# Patient Record
Sex: Male | Born: 1940 | Race: White | Hispanic: No | Marital: Married | Smoking: Never smoker
Health system: Southern US, Community
[De-identification: ages and names within clinical notes are randomized; demographics above are authoritative.]

## PROBLEM LIST (undated history)

## (undated) DIAGNOSIS — B029 Zoster without complications: Secondary | ICD-10-CM

## (undated) DIAGNOSIS — L84 Corns and callosities: Secondary | ICD-10-CM

## (undated) DIAGNOSIS — E039 Hypothyroidism, unspecified: Secondary | ICD-10-CM

## (undated) DIAGNOSIS — G250 Essential tremor: Secondary | ICD-10-CM

## (undated) DIAGNOSIS — R5383 Other fatigue: Secondary | ICD-10-CM

## (undated) DIAGNOSIS — M2041 Other hammer toe(s) (acquired), right foot: Secondary | ICD-10-CM

## (undated) DIAGNOSIS — I495 Sick sinus syndrome: Secondary | ICD-10-CM

## (undated) DIAGNOSIS — N201 Calculus of ureter: Secondary | ICD-10-CM

## (undated) DIAGNOSIS — M2042 Other hammer toe(s) (acquired), left foot: Secondary | ICD-10-CM

## (undated) DIAGNOSIS — B9681 Helicobacter pylori [H. pylori] as the cause of diseases classified elsewhere: Secondary | ICD-10-CM

## (undated) DIAGNOSIS — R001 Bradycardia, unspecified: Secondary | ICD-10-CM

## (undated) DIAGNOSIS — R040 Epistaxis: Secondary | ICD-10-CM

## (undated) DIAGNOSIS — K117 Disturbances of salivary secretion: Secondary | ICD-10-CM

## (undated) DIAGNOSIS — R600 Localized edema: Secondary | ICD-10-CM

## (undated) DIAGNOSIS — K5909 Other constipation: Secondary | ICD-10-CM

## (undated) DIAGNOSIS — M79644 Pain in right finger(s): Secondary | ICD-10-CM

## (undated) DIAGNOSIS — I10 Essential (primary) hypertension: Secondary | ICD-10-CM

## (undated) DIAGNOSIS — H6123 Impacted cerumen, bilateral: Secondary | ICD-10-CM

## (undated) DIAGNOSIS — R233 Spontaneous ecchymoses: Secondary | ICD-10-CM

## (undated) DIAGNOSIS — E119 Type 2 diabetes mellitus without complications: Secondary | ICD-10-CM

## (undated) DIAGNOSIS — E291 Testicular hypofunction: Secondary | ICD-10-CM

## (undated) DIAGNOSIS — E663 Overweight: Secondary | ICD-10-CM

## (undated) DIAGNOSIS — M79661 Pain in right lower leg: Secondary | ICD-10-CM

## (undated) DIAGNOSIS — I482 Chronic atrial fibrillation, unspecified: Secondary | ICD-10-CM

## (undated) DIAGNOSIS — N529 Male erectile dysfunction, unspecified: Secondary | ICD-10-CM

## (undated) DIAGNOSIS — K279 Peptic ulcer, site unspecified, unspecified as acute or chronic, without hemorrhage or perforation: Secondary | ICD-10-CM

## (undated) DIAGNOSIS — C678 Malignant neoplasm of overlapping sites of bladder: Secondary | ICD-10-CM

## (undated) DIAGNOSIS — N200 Calculus of kidney: Secondary | ICD-10-CM

## (undated) DIAGNOSIS — R6884 Jaw pain: Secondary | ICD-10-CM

## (undated) DIAGNOSIS — L03115 Cellulitis of right lower limb: Secondary | ICD-10-CM

## (undated) DIAGNOSIS — E785 Hyperlipidemia, unspecified: Secondary | ICD-10-CM

## (undated) DIAGNOSIS — F5101 Primary insomnia: Secondary | ICD-10-CM

## (undated) DIAGNOSIS — C679 Malignant neoplasm of bladder, unspecified: Secondary | ICD-10-CM

## (undated) DIAGNOSIS — G4730 Sleep apnea, unspecified: Secondary | ICD-10-CM

## (undated) DIAGNOSIS — R0602 Shortness of breath: Secondary | ICD-10-CM

## (undated) DIAGNOSIS — R3911 Hesitancy of micturition: Secondary | ICD-10-CM

## (undated) DIAGNOSIS — M26622 Arthralgia of left temporomandibular joint: Secondary | ICD-10-CM

## (undated) DIAGNOSIS — I509 Heart failure, unspecified: Secondary | ICD-10-CM

## (undated) DIAGNOSIS — I4891 Unspecified atrial fibrillation: Secondary | ICD-10-CM

## (undated) DIAGNOSIS — C4492 Squamous cell carcinoma of skin, unspecified: Secondary | ICD-10-CM

## (undated) HISTORY — DX: Jaw pain: R68.84

## (undated) HISTORY — DX: Bradycardia, unspecified: R00.1

## (undated) HISTORY — DX: Male erectile dysfunction, unspecified: N52.9

## (undated) HISTORY — DX: Arthralgia of left temporomandibular joint: M26.622

## (undated) HISTORY — DX: Other constipation: K59.09

## (undated) HISTORY — DX: Other hammer toe(s) (acquired), right foot: M20.41

## (undated) HISTORY — DX: Other fatigue: R53.83

## (undated) HISTORY — DX: Shortness of breath: R06.02

## (undated) HISTORY — DX: Essential (primary) hypertension: I10

## (undated) HISTORY — DX: Testicular hypofunction: E29.1

## (undated) HISTORY — DX: Cellulitis of right lower limb: L03.115

## (undated) HISTORY — DX: Hypothyroidism, unspecified: E03.9

## (undated) HISTORY — DX: Impacted cerumen, bilateral: H61.23

## (undated) HISTORY — DX: Pain in right finger(s): M79.644

## (undated) HISTORY — DX: Calculus of kidney: N20.0

## (undated) HISTORY — DX: Malignant neoplasm of overlapping sites of bladder: C67.8

## (undated) HISTORY — DX: Hyperlipidemia, unspecified: E78.5

## (undated) HISTORY — DX: Malignant neoplasm of bladder, unspecified: C67.9

## (undated) HISTORY — DX: Localized edema: R60.0

## (undated) HISTORY — DX: Spontaneous ecchymoses: R23.3

## (undated) HISTORY — DX: Sick sinus syndrome: I49.5

## (undated) HISTORY — DX: Calculus of ureter: N20.1

## (undated) HISTORY — DX: Epistaxis: R04.0

## (undated) HISTORY — DX: Corns and callosities: L84

## (undated) HISTORY — DX: Primary insomnia: F51.01

## (undated) HISTORY — DX: Type 2 diabetes mellitus without complications: E11.9

## (undated) HISTORY — DX: Sleep apnea, unspecified: G47.30

## (undated) HISTORY — DX: Disturbances of salivary secretion: K11.7

## (undated) HISTORY — DX: Peptic ulcer, site unspecified, unspecified as acute or chronic, without hemorrhage or perforation: K27.9

## (undated) HISTORY — DX: Essential tremor: G25.0

## (undated) HISTORY — DX: Chronic atrial fibrillation, unspecified: I48.20

## (undated) HISTORY — DX: Overweight: E66.3

## (undated) HISTORY — DX: Hesitancy of micturition: R39.11

## (undated) HISTORY — DX: Pain in right lower leg: M79.661

## (undated) HISTORY — DX: Squamous cell carcinoma of skin, unspecified: C44.92

## (undated) HISTORY — DX: Zoster without complications: B02.9

## (undated) HISTORY — PX: SHOULDER SURGERY: SHX246

## (undated) HISTORY — PX: LUMBAR SPINE SURGERY: SHX701

## (undated) HISTORY — DX: Other hammer toe(s) (acquired), left foot: M20.42

## (undated) HISTORY — DX: Helicobacter pylori (H. pylori) as the cause of diseases classified elsewhere: B96.81

---
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
## 1992-06-19 DIAGNOSIS — E119 Type 2 diabetes mellitus without complications: Secondary | ICD-10-CM | POA: Insufficient documentation

## 1992-06-19 HISTORY — DX: Type 2 diabetes mellitus without complications: E11.9

## 2011-06-29 DIAGNOSIS — E119 Type 2 diabetes mellitus without complications: Secondary | ICD-10-CM | POA: Diagnosis not present

## 2011-06-29 DIAGNOSIS — I959 Hypotension, unspecified: Secondary | ICD-10-CM | POA: Diagnosis not present

## 2011-06-29 DIAGNOSIS — L570 Actinic keratosis: Secondary | ICD-10-CM | POA: Diagnosis not present

## 2011-06-29 DIAGNOSIS — E785 Hyperlipidemia, unspecified: Secondary | ICD-10-CM | POA: Diagnosis not present

## 2011-06-29 DIAGNOSIS — M539 Dorsopathy, unspecified: Secondary | ICD-10-CM | POA: Diagnosis not present

## 2011-06-29 DIAGNOSIS — R110 Nausea: Secondary | ICD-10-CM | POA: Diagnosis not present

## 2011-07-05 DIAGNOSIS — R39198 Other difficulties with micturition: Secondary | ICD-10-CM | POA: Diagnosis not present

## 2011-07-05 DIAGNOSIS — R319 Hematuria, unspecified: Secondary | ICD-10-CM | POA: Diagnosis not present

## 2011-07-05 DIAGNOSIS — C679 Malignant neoplasm of bladder, unspecified: Secondary | ICD-10-CM | POA: Diagnosis not present

## 2011-07-06 DIAGNOSIS — M545 Low back pain, unspecified: Secondary | ICD-10-CM | POA: Diagnosis not present

## 2011-07-06 DIAGNOSIS — M5137 Other intervertebral disc degeneration, lumbosacral region: Secondary | ICD-10-CM | POA: Diagnosis not present

## 2011-07-06 DIAGNOSIS — M48061 Spinal stenosis, lumbar region without neurogenic claudication: Secondary | ICD-10-CM | POA: Diagnosis not present

## 2011-07-06 DIAGNOSIS — M5126 Other intervertebral disc displacement, lumbar region: Secondary | ICD-10-CM | POA: Diagnosis not present

## 2011-07-06 DIAGNOSIS — G8929 Other chronic pain: Secondary | ICD-10-CM | POA: Diagnosis not present

## 2011-07-06 DIAGNOSIS — M47817 Spondylosis without myelopathy or radiculopathy, lumbosacral region: Secondary | ICD-10-CM | POA: Diagnosis not present

## 2011-07-12 DIAGNOSIS — C679 Malignant neoplasm of bladder, unspecified: Secondary | ICD-10-CM | POA: Diagnosis not present

## 2011-08-03 DIAGNOSIS — L600 Ingrowing nail: Secondary | ICD-10-CM | POA: Diagnosis not present

## 2011-10-02 DIAGNOSIS — E785 Hyperlipidemia, unspecified: Secondary | ICD-10-CM | POA: Diagnosis not present

## 2011-10-02 DIAGNOSIS — M549 Dorsalgia, unspecified: Secondary | ICD-10-CM | POA: Diagnosis not present

## 2011-10-02 DIAGNOSIS — E119 Type 2 diabetes mellitus without complications: Secondary | ICD-10-CM | POA: Diagnosis not present

## 2011-10-04 DIAGNOSIS — C679 Malignant neoplasm of bladder, unspecified: Secondary | ICD-10-CM | POA: Diagnosis not present

## 2011-12-07 DIAGNOSIS — E119 Type 2 diabetes mellitus without complications: Secondary | ICD-10-CM | POA: Diagnosis not present

## 2011-12-28 DIAGNOSIS — L600 Ingrowing nail: Secondary | ICD-10-CM | POA: Diagnosis not present

## 2011-12-28 DIAGNOSIS — E119 Type 2 diabetes mellitus without complications: Secondary | ICD-10-CM | POA: Diagnosis not present

## 2012-01-03 DIAGNOSIS — C679 Malignant neoplasm of bladder, unspecified: Secondary | ICD-10-CM | POA: Diagnosis not present

## 2012-04-18 DIAGNOSIS — C679 Malignant neoplasm of bladder, unspecified: Secondary | ICD-10-CM | POA: Diagnosis not present

## 2012-05-27 DIAGNOSIS — IMO0002 Reserved for concepts with insufficient information to code with codable children: Secondary | ICD-10-CM | POA: Diagnosis not present

## 2012-05-27 DIAGNOSIS — J3489 Other specified disorders of nose and nasal sinuses: Secondary | ICD-10-CM | POA: Diagnosis not present

## 2012-06-06 DIAGNOSIS — IMO0002 Reserved for concepts with insufficient information to code with codable children: Secondary | ICD-10-CM | POA: Diagnosis not present

## 2012-06-14 DIAGNOSIS — Z6834 Body mass index (BMI) 34.0-34.9, adult: Secondary | ICD-10-CM | POA: Diagnosis not present

## 2012-06-14 DIAGNOSIS — E119 Type 2 diabetes mellitus without complications: Secondary | ICD-10-CM | POA: Diagnosis not present

## 2012-06-14 DIAGNOSIS — M549 Dorsalgia, unspecified: Secondary | ICD-10-CM | POA: Diagnosis not present

## 2012-06-14 DIAGNOSIS — K219 Gastro-esophageal reflux disease without esophagitis: Secondary | ICD-10-CM | POA: Diagnosis not present

## 2012-06-19 DIAGNOSIS — B029 Zoster without complications: Secondary | ICD-10-CM

## 2012-06-19 HISTORY — DX: Zoster without complications: B02.9

## 2012-06-20 DIAGNOSIS — C679 Malignant neoplasm of bladder, unspecified: Secondary | ICD-10-CM | POA: Diagnosis not present

## 2012-06-20 DIAGNOSIS — R109 Unspecified abdominal pain: Secondary | ICD-10-CM | POA: Diagnosis not present

## 2012-06-20 DIAGNOSIS — R3129 Other microscopic hematuria: Secondary | ICD-10-CM | POA: Diagnosis not present

## 2012-06-20 DIAGNOSIS — R1032 Left lower quadrant pain: Secondary | ICD-10-CM | POA: Diagnosis not present

## 2012-06-20 DIAGNOSIS — N200 Calculus of kidney: Secondary | ICD-10-CM | POA: Diagnosis not present

## 2012-06-21 DIAGNOSIS — C679 Malignant neoplasm of bladder, unspecified: Secondary | ICD-10-CM | POA: Diagnosis not present

## 2012-06-21 DIAGNOSIS — B0229 Other postherpetic nervous system involvement: Secondary | ICD-10-CM | POA: Diagnosis not present

## 2012-06-25 DIAGNOSIS — E119 Type 2 diabetes mellitus without complications: Secondary | ICD-10-CM | POA: Diagnosis not present

## 2012-06-25 DIAGNOSIS — D494 Neoplasm of unspecified behavior of bladder: Secondary | ICD-10-CM | POA: Diagnosis not present

## 2012-06-25 DIAGNOSIS — C679 Malignant neoplasm of bladder, unspecified: Secondary | ICD-10-CM | POA: Diagnosis not present

## 2012-06-28 DIAGNOSIS — B0229 Other postherpetic nervous system involvement: Secondary | ICD-10-CM | POA: Diagnosis not present

## 2012-07-04 DIAGNOSIS — E119 Type 2 diabetes mellitus without complications: Secondary | ICD-10-CM | POA: Diagnosis not present

## 2012-07-04 DIAGNOSIS — B029 Zoster without complications: Secondary | ICD-10-CM | POA: Diagnosis not present

## 2012-07-04 DIAGNOSIS — G8929 Other chronic pain: Secondary | ICD-10-CM | POA: Diagnosis not present

## 2012-07-04 DIAGNOSIS — I10 Essential (primary) hypertension: Secondary | ICD-10-CM | POA: Diagnosis not present

## 2012-07-04 DIAGNOSIS — E039 Hypothyroidism, unspecified: Secondary | ICD-10-CM | POA: Diagnosis not present

## 2012-07-04 DIAGNOSIS — E785 Hyperlipidemia, unspecified: Secondary | ICD-10-CM | POA: Diagnosis not present

## 2012-07-09 DIAGNOSIS — R6889 Other general symptoms and signs: Secondary | ICD-10-CM | POA: Diagnosis not present

## 2012-07-11 DIAGNOSIS — R6889 Other general symptoms and signs: Secondary | ICD-10-CM | POA: Diagnosis not present

## 2012-07-11 DIAGNOSIS — IMO0002 Reserved for concepts with insufficient information to code with codable children: Secondary | ICD-10-CM | POA: Diagnosis not present

## 2012-07-11 DIAGNOSIS — M48061 Spinal stenosis, lumbar region without neurogenic claudication: Secondary | ICD-10-CM | POA: Diagnosis not present

## 2012-07-11 DIAGNOSIS — M5137 Other intervertebral disc degeneration, lumbosacral region: Secondary | ICD-10-CM | POA: Diagnosis not present

## 2012-07-16 DIAGNOSIS — IMO0002 Reserved for concepts with insufficient information to code with codable children: Secondary | ICD-10-CM | POA: Diagnosis not present

## 2012-07-16 DIAGNOSIS — M48061 Spinal stenosis, lumbar region without neurogenic claudication: Secondary | ICD-10-CM | POA: Diagnosis not present

## 2012-07-16 DIAGNOSIS — M5137 Other intervertebral disc degeneration, lumbosacral region: Secondary | ICD-10-CM | POA: Diagnosis not present

## 2012-07-16 DIAGNOSIS — Z79899 Other long term (current) drug therapy: Secondary | ICD-10-CM | POA: Diagnosis not present

## 2012-07-16 DIAGNOSIS — M1610 Unilateral primary osteoarthritis, unspecified hip: Secondary | ICD-10-CM | POA: Diagnosis not present

## 2012-07-16 DIAGNOSIS — M169 Osteoarthritis of hip, unspecified: Secondary | ICD-10-CM | POA: Diagnosis not present

## 2012-07-30 DIAGNOSIS — IMO0002 Reserved for concepts with insufficient information to code with codable children: Secondary | ICD-10-CM | POA: Diagnosis not present

## 2012-07-30 DIAGNOSIS — M1610 Unilateral primary osteoarthritis, unspecified hip: Secondary | ICD-10-CM | POA: Diagnosis not present

## 2012-07-30 DIAGNOSIS — M169 Osteoarthritis of hip, unspecified: Secondary | ICD-10-CM | POA: Diagnosis not present

## 2012-07-30 DIAGNOSIS — I10 Essential (primary) hypertension: Secondary | ICD-10-CM | POA: Diagnosis not present

## 2012-07-30 DIAGNOSIS — Z79899 Other long term (current) drug therapy: Secondary | ICD-10-CM | POA: Diagnosis not present

## 2012-07-30 DIAGNOSIS — M5137 Other intervertebral disc degeneration, lumbosacral region: Secondary | ICD-10-CM | POA: Diagnosis not present

## 2012-07-30 DIAGNOSIS — M48061 Spinal stenosis, lumbar region without neurogenic claudication: Secondary | ICD-10-CM | POA: Diagnosis not present

## 2012-07-30 DIAGNOSIS — E119 Type 2 diabetes mellitus without complications: Secondary | ICD-10-CM | POA: Diagnosis not present

## 2012-08-02 DIAGNOSIS — R109 Unspecified abdominal pain: Secondary | ICD-10-CM | POA: Diagnosis not present

## 2012-08-02 DIAGNOSIS — K5900 Constipation, unspecified: Secondary | ICD-10-CM | POA: Diagnosis not present

## 2012-08-07 DIAGNOSIS — N289 Disorder of kidney and ureter, unspecified: Secondary | ICD-10-CM | POA: Diagnosis not present

## 2012-08-07 DIAGNOSIS — R109 Unspecified abdominal pain: Secondary | ICD-10-CM | POA: Diagnosis not present

## 2012-08-07 DIAGNOSIS — K5730 Diverticulosis of large intestine without perforation or abscess without bleeding: Secondary | ICD-10-CM | POA: Diagnosis not present

## 2012-08-14 DIAGNOSIS — N182 Chronic kidney disease, stage 2 (mild): Secondary | ICD-10-CM | POA: Diagnosis not present

## 2012-08-14 DIAGNOSIS — R809 Proteinuria, unspecified: Secondary | ICD-10-CM | POA: Diagnosis not present

## 2012-08-16 DIAGNOSIS — R809 Proteinuria, unspecified: Secondary | ICD-10-CM | POA: Diagnosis not present

## 2012-08-16 DIAGNOSIS — N182 Chronic kidney disease, stage 2 (mild): Secondary | ICD-10-CM | POA: Diagnosis not present

## 2012-08-16 DIAGNOSIS — N189 Chronic kidney disease, unspecified: Secondary | ICD-10-CM | POA: Diagnosis not present

## 2012-08-16 DIAGNOSIS — N179 Acute kidney failure, unspecified: Secondary | ICD-10-CM | POA: Diagnosis not present

## 2012-08-22 DIAGNOSIS — K2900 Acute gastritis without bleeding: Secondary | ICD-10-CM | POA: Diagnosis not present

## 2012-08-22 DIAGNOSIS — K319 Disease of stomach and duodenum, unspecified: Secondary | ICD-10-CM | POA: Diagnosis not present

## 2012-08-22 DIAGNOSIS — K5730 Diverticulosis of large intestine without perforation or abscess without bleeding: Secondary | ICD-10-CM | POA: Diagnosis not present

## 2012-08-22 DIAGNOSIS — K2940 Chronic atrophic gastritis without bleeding: Secondary | ICD-10-CM | POA: Diagnosis not present

## 2012-08-22 DIAGNOSIS — E119 Type 2 diabetes mellitus without complications: Secondary | ICD-10-CM | POA: Diagnosis not present

## 2012-08-22 DIAGNOSIS — K648 Other hemorrhoids: Secondary | ICD-10-CM | POA: Diagnosis not present

## 2012-08-22 DIAGNOSIS — D126 Benign neoplasm of colon, unspecified: Secondary | ICD-10-CM | POA: Diagnosis not present

## 2012-08-22 DIAGNOSIS — R1013 Epigastric pain: Secondary | ICD-10-CM | POA: Diagnosis not present

## 2012-08-22 DIAGNOSIS — I10 Essential (primary) hypertension: Secondary | ICD-10-CM | POA: Diagnosis not present

## 2012-09-02 DIAGNOSIS — M79609 Pain in unspecified limb: Secondary | ICD-10-CM | POA: Diagnosis not present

## 2012-09-02 DIAGNOSIS — M25559 Pain in unspecified hip: Secondary | ICD-10-CM | POA: Diagnosis not present

## 2012-09-04 DIAGNOSIS — E119 Type 2 diabetes mellitus without complications: Secondary | ICD-10-CM | POA: Diagnosis not present

## 2012-09-04 DIAGNOSIS — N182 Chronic kidney disease, stage 2 (mild): Secondary | ICD-10-CM | POA: Diagnosis not present

## 2012-09-04 DIAGNOSIS — R809 Proteinuria, unspecified: Secondary | ICD-10-CM | POA: Diagnosis not present

## 2012-09-04 DIAGNOSIS — N189 Chronic kidney disease, unspecified: Secondary | ICD-10-CM | POA: Diagnosis not present

## 2012-09-04 DIAGNOSIS — E875 Hyperkalemia: Secondary | ICD-10-CM | POA: Diagnosis not present

## 2012-09-05 DIAGNOSIS — IMO0002 Reserved for concepts with insufficient information to code with codable children: Secondary | ICD-10-CM | POA: Diagnosis not present

## 2012-09-05 DIAGNOSIS — M48061 Spinal stenosis, lumbar region without neurogenic claudication: Secondary | ICD-10-CM | POA: Diagnosis not present

## 2012-09-10 DIAGNOSIS — G5620 Lesion of ulnar nerve, unspecified upper limb: Secondary | ICD-10-CM | POA: Diagnosis not present

## 2012-09-10 DIAGNOSIS — M47817 Spondylosis without myelopathy or radiculopathy, lumbosacral region: Secondary | ICD-10-CM | POA: Diagnosis not present

## 2012-09-10 DIAGNOSIS — M8448XA Pathological fracture, other site, initial encounter for fracture: Secondary | ICD-10-CM | POA: Diagnosis not present

## 2012-09-10 DIAGNOSIS — M5137 Other intervertebral disc degeneration, lumbosacral region: Secondary | ICD-10-CM | POA: Diagnosis not present

## 2012-09-10 DIAGNOSIS — M5126 Other intervertebral disc displacement, lumbar region: Secondary | ICD-10-CM | POA: Diagnosis not present

## 2012-09-10 DIAGNOSIS — M797 Fibromyalgia: Secondary | ICD-10-CM | POA: Diagnosis not present

## 2012-09-10 DIAGNOSIS — M51379 Other intervertebral disc degeneration, lumbosacral region without mention of lumbar back pain or lower extremity pain: Secondary | ICD-10-CM | POA: Diagnosis not present

## 2012-09-10 DIAGNOSIS — M4310 Spondylolisthesis, site unspecified: Secondary | ICD-10-CM | POA: Diagnosis not present

## 2012-09-10 DIAGNOSIS — M790 Rheumatism, unspecified: Secondary | ICD-10-CM | POA: Diagnosis not present

## 2012-09-10 DIAGNOSIS — M48061 Spinal stenosis, lumbar region without neurogenic claudication: Secondary | ICD-10-CM | POA: Diagnosis not present

## 2012-09-10 DIAGNOSIS — G608 Other hereditary and idiopathic neuropathies: Secondary | ICD-10-CM | POA: Diagnosis not present

## 2012-09-10 DIAGNOSIS — G1221 Amyotrophic lateral sclerosis: Secondary | ICD-10-CM | POA: Diagnosis not present

## 2012-09-12 DIAGNOSIS — M545 Low back pain, unspecified: Secondary | ICD-10-CM | POA: Diagnosis not present

## 2012-09-13 DIAGNOSIS — N189 Chronic kidney disease, unspecified: Secondary | ICD-10-CM | POA: Diagnosis not present

## 2012-09-13 DIAGNOSIS — N281 Cyst of kidney, acquired: Secondary | ICD-10-CM | POA: Diagnosis not present

## 2012-09-16 DIAGNOSIS — M6281 Muscle weakness (generalized): Secondary | ICD-10-CM | POA: Diagnosis not present

## 2012-09-19 DIAGNOSIS — M545 Low back pain, unspecified: Secondary | ICD-10-CM | POA: Diagnosis not present

## 2012-09-19 DIAGNOSIS — M48061 Spinal stenosis, lumbar region without neurogenic claudication: Secondary | ICD-10-CM | POA: Diagnosis not present

## 2012-09-19 DIAGNOSIS — IMO0002 Reserved for concepts with insufficient information to code with codable children: Secondary | ICD-10-CM | POA: Diagnosis not present

## 2012-09-19 DIAGNOSIS — M25559 Pain in unspecified hip: Secondary | ICD-10-CM | POA: Diagnosis not present

## 2012-09-24 DIAGNOSIS — M545 Low back pain, unspecified: Secondary | ICD-10-CM | POA: Diagnosis not present

## 2012-09-24 DIAGNOSIS — M25559 Pain in unspecified hip: Secondary | ICD-10-CM | POA: Diagnosis not present

## 2012-09-25 DIAGNOSIS — R29898 Other symptoms and signs involving the musculoskeletal system: Secondary | ICD-10-CM | POA: Diagnosis not present

## 2012-09-25 DIAGNOSIS — IMO0002 Reserved for concepts with insufficient information to code with codable children: Secondary | ICD-10-CM | POA: Diagnosis not present

## 2012-09-25 DIAGNOSIS — M48061 Spinal stenosis, lumbar region without neurogenic claudication: Secondary | ICD-10-CM | POA: Diagnosis not present

## 2012-10-01 DIAGNOSIS — M545 Low back pain, unspecified: Secondary | ICD-10-CM | POA: Diagnosis not present

## 2012-10-01 DIAGNOSIS — M25559 Pain in unspecified hip: Secondary | ICD-10-CM | POA: Diagnosis not present

## 2012-10-14 DIAGNOSIS — IMO0002 Reserved for concepts with insufficient information to code with codable children: Secondary | ICD-10-CM | POA: Diagnosis not present

## 2012-10-18 DIAGNOSIS — K5730 Diverticulosis of large intestine without perforation or abscess without bleeding: Secondary | ICD-10-CM | POA: Diagnosis not present

## 2012-10-18 DIAGNOSIS — K2960 Other gastritis without bleeding: Secondary | ICD-10-CM | POA: Diagnosis not present

## 2012-10-18 DIAGNOSIS — D126 Benign neoplasm of colon, unspecified: Secondary | ICD-10-CM | POA: Diagnosis not present

## 2012-10-18 DIAGNOSIS — K5900 Constipation, unspecified: Secondary | ICD-10-CM | POA: Diagnosis not present

## 2012-10-26 DIAGNOSIS — R5381 Other malaise: Secondary | ICD-10-CM | POA: Diagnosis not present

## 2012-10-26 DIAGNOSIS — R937 Abnormal findings on diagnostic imaging of other parts of musculoskeletal system: Secondary | ICD-10-CM | POA: Diagnosis not present

## 2012-10-26 DIAGNOSIS — IMO0002 Reserved for concepts with insufficient information to code with codable children: Secondary | ICD-10-CM | POA: Diagnosis not present

## 2012-10-26 DIAGNOSIS — R5383 Other fatigue: Secondary | ICD-10-CM | POA: Diagnosis not present

## 2012-10-26 DIAGNOSIS — M5380 Other specified dorsopathies, site unspecified: Secondary | ICD-10-CM | POA: Diagnosis not present

## 2012-10-28 DIAGNOSIS — R937 Abnormal findings on diagnostic imaging of other parts of musculoskeletal system: Secondary | ICD-10-CM | POA: Diagnosis not present

## 2012-10-28 DIAGNOSIS — IMO0002 Reserved for concepts with insufficient information to code with codable children: Secondary | ICD-10-CM | POA: Diagnosis not present

## 2012-10-28 DIAGNOSIS — R5381 Other malaise: Secondary | ICD-10-CM | POA: Diagnosis not present

## 2012-10-28 DIAGNOSIS — M5380 Other specified dorsopathies, site unspecified: Secondary | ICD-10-CM | POA: Diagnosis not present

## 2012-11-03 DIAGNOSIS — Z79899 Other long term (current) drug therapy: Secondary | ICD-10-CM | POA: Diagnosis not present

## 2012-11-03 DIAGNOSIS — R1110 Vomiting, unspecified: Secondary | ICD-10-CM | POA: Diagnosis not present

## 2012-11-03 DIAGNOSIS — R109 Unspecified abdominal pain: Secondary | ICD-10-CM | POA: Diagnosis not present

## 2012-11-03 DIAGNOSIS — K279 Peptic ulcer, site unspecified, unspecified as acute or chronic, without hemorrhage or perforation: Secondary | ICD-10-CM | POA: Diagnosis not present

## 2012-11-03 DIAGNOSIS — I10 Essential (primary) hypertension: Secondary | ICD-10-CM | POA: Diagnosis not present

## 2012-11-03 DIAGNOSIS — E119 Type 2 diabetes mellitus without complications: Secondary | ICD-10-CM | POA: Diagnosis not present

## 2012-11-03 DIAGNOSIS — E039 Hypothyroidism, unspecified: Secondary | ICD-10-CM | POA: Diagnosis not present

## 2012-11-03 DIAGNOSIS — Z794 Long term (current) use of insulin: Secondary | ICD-10-CM | POA: Diagnosis not present

## 2012-11-04 DIAGNOSIS — IMO0002 Reserved for concepts with insufficient information to code with codable children: Secondary | ICD-10-CM | POA: Diagnosis not present

## 2012-11-04 DIAGNOSIS — R5381 Other malaise: Secondary | ICD-10-CM | POA: Diagnosis not present

## 2012-11-06 DIAGNOSIS — N182 Chronic kidney disease, stage 2 (mild): Secondary | ICD-10-CM | POA: Diagnosis not present

## 2012-11-06 DIAGNOSIS — R809 Proteinuria, unspecified: Secondary | ICD-10-CM | POA: Diagnosis not present

## 2012-11-06 DIAGNOSIS — E119 Type 2 diabetes mellitus without complications: Secondary | ICD-10-CM | POA: Diagnosis not present

## 2012-11-06 DIAGNOSIS — E213 Hyperparathyroidism, unspecified: Secondary | ICD-10-CM | POA: Diagnosis not present

## 2012-11-22 DIAGNOSIS — G5720 Lesion of femoral nerve, unspecified lower limb: Secondary | ICD-10-CM | POA: Diagnosis not present

## 2012-11-25 DIAGNOSIS — G5720 Lesion of femoral nerve, unspecified lower limb: Secondary | ICD-10-CM | POA: Diagnosis not present

## 2012-11-25 DIAGNOSIS — S7010XA Contusion of unspecified thigh, initial encounter: Secondary | ICD-10-CM | POA: Diagnosis not present

## 2012-11-25 DIAGNOSIS — M5137 Other intervertebral disc degeneration, lumbosacral region: Secondary | ICD-10-CM | POA: Diagnosis not present

## 2012-11-25 DIAGNOSIS — K5730 Diverticulosis of large intestine without perforation or abscess without bleeding: Secondary | ICD-10-CM | POA: Diagnosis not present

## 2012-12-04 DIAGNOSIS — M545 Low back pain, unspecified: Secondary | ICD-10-CM | POA: Diagnosis not present

## 2012-12-04 DIAGNOSIS — R262 Difficulty in walking, not elsewhere classified: Secondary | ICD-10-CM | POA: Diagnosis not present

## 2012-12-04 DIAGNOSIS — M25559 Pain in unspecified hip: Secondary | ICD-10-CM | POA: Diagnosis not present

## 2012-12-11 DIAGNOSIS — M545 Low back pain, unspecified: Secondary | ICD-10-CM | POA: Diagnosis not present

## 2012-12-11 DIAGNOSIS — M25559 Pain in unspecified hip: Secondary | ICD-10-CM | POA: Diagnosis not present

## 2012-12-11 DIAGNOSIS — R262 Difficulty in walking, not elsewhere classified: Secondary | ICD-10-CM | POA: Diagnosis not present

## 2012-12-13 DIAGNOSIS — R109 Unspecified abdominal pain: Secondary | ICD-10-CM | POA: Diagnosis not present

## 2012-12-17 DIAGNOSIS — D494 Neoplasm of unspecified behavior of bladder: Secondary | ICD-10-CM | POA: Diagnosis not present

## 2012-12-18 DIAGNOSIS — M545 Low back pain, unspecified: Secondary | ICD-10-CM | POA: Diagnosis not present

## 2012-12-18 DIAGNOSIS — M25559 Pain in unspecified hip: Secondary | ICD-10-CM | POA: Diagnosis not present

## 2012-12-18 DIAGNOSIS — R262 Difficulty in walking, not elsewhere classified: Secondary | ICD-10-CM | POA: Diagnosis not present

## 2012-12-24 DIAGNOSIS — C679 Malignant neoplasm of bladder, unspecified: Secondary | ICD-10-CM | POA: Diagnosis not present

## 2012-12-24 DIAGNOSIS — N402 Nodular prostate without lower urinary tract symptoms: Secondary | ICD-10-CM | POA: Diagnosis not present

## 2012-12-27 DIAGNOSIS — G5720 Lesion of femoral nerve, unspecified lower limb: Secondary | ICD-10-CM | POA: Diagnosis not present

## 2013-01-20 DIAGNOSIS — IMO0001 Reserved for inherently not codable concepts without codable children: Secondary | ICD-10-CM | POA: Diagnosis not present

## 2013-01-20 DIAGNOSIS — G5720 Lesion of femoral nerve, unspecified lower limb: Secondary | ICD-10-CM | POA: Diagnosis not present

## 2013-01-24 DIAGNOSIS — M545 Low back pain, unspecified: Secondary | ICD-10-CM | POA: Diagnosis not present

## 2013-01-24 DIAGNOSIS — R262 Difficulty in walking, not elsewhere classified: Secondary | ICD-10-CM | POA: Diagnosis not present

## 2013-01-24 DIAGNOSIS — M25559 Pain in unspecified hip: Secondary | ICD-10-CM | POA: Diagnosis not present

## 2013-01-30 DIAGNOSIS — M25559 Pain in unspecified hip: Secondary | ICD-10-CM | POA: Diagnosis not present

## 2013-01-30 DIAGNOSIS — M545 Low back pain, unspecified: Secondary | ICD-10-CM | POA: Diagnosis not present

## 2013-01-30 DIAGNOSIS — R262 Difficulty in walking, not elsewhere classified: Secondary | ICD-10-CM | POA: Diagnosis not present

## 2013-02-06 DIAGNOSIS — M25559 Pain in unspecified hip: Secondary | ICD-10-CM | POA: Diagnosis not present

## 2013-02-06 DIAGNOSIS — R262 Difficulty in walking, not elsewhere classified: Secondary | ICD-10-CM | POA: Diagnosis not present

## 2013-02-06 DIAGNOSIS — M545 Low back pain, unspecified: Secondary | ICD-10-CM | POA: Diagnosis not present

## 2013-03-05 DIAGNOSIS — G5720 Lesion of femoral nerve, unspecified lower limb: Secondary | ICD-10-CM | POA: Diagnosis not present

## 2013-03-11 DIAGNOSIS — IMO0002 Reserved for concepts with insufficient information to code with codable children: Secondary | ICD-10-CM | POA: Diagnosis not present

## 2013-03-11 DIAGNOSIS — B0229 Other postherpetic nervous system involvement: Secondary | ICD-10-CM | POA: Diagnosis not present

## 2013-03-11 DIAGNOSIS — G5720 Lesion of femoral nerve, unspecified lower limb: Secondary | ICD-10-CM | POA: Diagnosis not present

## 2013-03-11 DIAGNOSIS — B029 Zoster without complications: Secondary | ICD-10-CM | POA: Diagnosis not present

## 2013-03-21 DIAGNOSIS — E038 Other specified hypothyroidism: Secondary | ICD-10-CM | POA: Diagnosis not present

## 2013-03-21 DIAGNOSIS — I10 Essential (primary) hypertension: Secondary | ICD-10-CM | POA: Diagnosis not present

## 2013-03-21 DIAGNOSIS — IMO0001 Reserved for inherently not codable concepts without codable children: Secondary | ICD-10-CM | POA: Diagnosis not present

## 2013-03-21 DIAGNOSIS — E1129 Type 2 diabetes mellitus with other diabetic kidney complication: Secondary | ICD-10-CM | POA: Diagnosis not present

## 2013-03-21 DIAGNOSIS — B0223 Postherpetic polyneuropathy: Secondary | ICD-10-CM | POA: Diagnosis not present

## 2013-03-21 DIAGNOSIS — E7800 Pure hypercholesterolemia, unspecified: Secondary | ICD-10-CM | POA: Diagnosis not present

## 2013-03-21 DIAGNOSIS — R5381 Other malaise: Secondary | ICD-10-CM | POA: Diagnosis not present

## 2013-03-21 DIAGNOSIS — R030 Elevated blood-pressure reading, without diagnosis of hypertension: Secondary | ICD-10-CM | POA: Diagnosis not present

## 2013-03-28 DIAGNOSIS — G5720 Lesion of femoral nerve, unspecified lower limb: Secondary | ICD-10-CM | POA: Diagnosis not present

## 2013-04-01 DIAGNOSIS — G5720 Lesion of femoral nerve, unspecified lower limb: Secondary | ICD-10-CM | POA: Diagnosis not present

## 2013-04-01 DIAGNOSIS — B029 Zoster without complications: Secondary | ICD-10-CM | POA: Diagnosis not present

## 2013-04-01 DIAGNOSIS — IMO0002 Reserved for concepts with insufficient information to code with codable children: Secondary | ICD-10-CM | POA: Diagnosis not present

## 2013-04-01 DIAGNOSIS — B0229 Other postherpetic nervous system involvement: Secondary | ICD-10-CM | POA: Diagnosis not present

## 2013-04-29 DIAGNOSIS — E1129 Type 2 diabetes mellitus with other diabetic kidney complication: Secondary | ICD-10-CM | POA: Diagnosis not present

## 2013-04-29 DIAGNOSIS — D494 Neoplasm of unspecified behavior of bladder: Secondary | ICD-10-CM | POA: Diagnosis not present

## 2013-04-29 DIAGNOSIS — B0223 Postherpetic polyneuropathy: Secondary | ICD-10-CM | POA: Diagnosis not present

## 2013-04-29 DIAGNOSIS — R030 Elevated blood-pressure reading, without diagnosis of hypertension: Secondary | ICD-10-CM | POA: Diagnosis not present

## 2013-04-30 DIAGNOSIS — G5720 Lesion of femoral nerve, unspecified lower limb: Secondary | ICD-10-CM

## 2013-04-30 HISTORY — DX: Lesion of femoral nerve, unspecified lower limb: G57.20

## 2013-06-03 DIAGNOSIS — G250 Essential tremor: Secondary | ICD-10-CM | POA: Insufficient documentation

## 2013-06-03 DIAGNOSIS — B0229 Other postherpetic nervous system involvement: Secondary | ICD-10-CM

## 2013-06-03 DIAGNOSIS — G5720 Lesion of femoral nerve, unspecified lower limb: Secondary | ICD-10-CM | POA: Diagnosis not present

## 2013-06-03 DIAGNOSIS — IMO0002 Reserved for concepts with insufficient information to code with codable children: Secondary | ICD-10-CM

## 2013-06-03 HISTORY — DX: Other postherpetic nervous system involvement: B02.29

## 2013-06-03 HISTORY — DX: Reserved for concepts with insufficient information to code with codable children: IMO0002

## 2013-07-02 DIAGNOSIS — N138 Other obstructive and reflux uropathy: Secondary | ICD-10-CM | POA: Diagnosis not present

## 2013-07-02 DIAGNOSIS — C679 Malignant neoplasm of bladder, unspecified: Secondary | ICD-10-CM | POA: Diagnosis not present

## 2013-07-02 DIAGNOSIS — N401 Enlarged prostate with lower urinary tract symptoms: Secondary | ICD-10-CM | POA: Diagnosis not present

## 2013-07-02 DIAGNOSIS — Z125 Encounter for screening for malignant neoplasm of prostate: Secondary | ICD-10-CM | POA: Diagnosis not present

## 2013-07-02 DIAGNOSIS — N200 Calculus of kidney: Secondary | ICD-10-CM | POA: Diagnosis not present

## 2013-07-02 DIAGNOSIS — D414 Neoplasm of uncertain behavior of bladder: Secondary | ICD-10-CM | POA: Diagnosis not present

## 2013-07-15 DIAGNOSIS — R0989 Other specified symptoms and signs involving the circulatory and respiratory systems: Secondary | ICD-10-CM | POA: Diagnosis not present

## 2013-07-15 DIAGNOSIS — D494 Neoplasm of unspecified behavior of bladder: Secondary | ICD-10-CM | POA: Diagnosis not present

## 2013-07-15 DIAGNOSIS — E1129 Type 2 diabetes mellitus with other diabetic kidney complication: Secondary | ICD-10-CM | POA: Diagnosis not present

## 2013-07-15 DIAGNOSIS — B351 Tinea unguium: Secondary | ICD-10-CM | POA: Diagnosis not present

## 2013-07-15 DIAGNOSIS — S61209A Unspecified open wound of unspecified finger without damage to nail, initial encounter: Secondary | ICD-10-CM | POA: Diagnosis not present

## 2013-07-15 DIAGNOSIS — IMO0001 Reserved for inherently not codable concepts without codable children: Secondary | ICD-10-CM | POA: Diagnosis not present

## 2013-07-15 DIAGNOSIS — R030 Elevated blood-pressure reading, without diagnosis of hypertension: Secondary | ICD-10-CM | POA: Diagnosis not present

## 2013-07-15 DIAGNOSIS — E7800 Pure hypercholesterolemia, unspecified: Secondary | ICD-10-CM | POA: Diagnosis not present

## 2013-07-15 DIAGNOSIS — Z23 Encounter for immunization: Secondary | ICD-10-CM | POA: Diagnosis not present

## 2013-07-15 DIAGNOSIS — Z79899 Other long term (current) drug therapy: Secondary | ICD-10-CM | POA: Diagnosis not present

## 2013-07-15 DIAGNOSIS — B0223 Postherpetic polyneuropathy: Secondary | ICD-10-CM | POA: Diagnosis not present

## 2013-07-16 DIAGNOSIS — N138 Other obstructive and reflux uropathy: Secondary | ICD-10-CM | POA: Diagnosis not present

## 2013-07-16 DIAGNOSIS — C679 Malignant neoplasm of bladder, unspecified: Secondary | ICD-10-CM | POA: Diagnosis not present

## 2013-07-16 DIAGNOSIS — N401 Enlarged prostate with lower urinary tract symptoms: Secondary | ICD-10-CM | POA: Diagnosis not present

## 2013-07-16 DIAGNOSIS — R972 Elevated prostate specific antigen [PSA]: Secondary | ICD-10-CM | POA: Diagnosis not present

## 2013-07-21 DIAGNOSIS — R209 Unspecified disturbances of skin sensation: Secondary | ICD-10-CM | POA: Diagnosis not present

## 2013-07-21 DIAGNOSIS — R0989 Other specified symptoms and signs involving the circulatory and respiratory systems: Secondary | ICD-10-CM | POA: Diagnosis not present

## 2013-09-05 DIAGNOSIS — E1139 Type 2 diabetes mellitus with other diabetic ophthalmic complication: Secondary | ICD-10-CM | POA: Diagnosis not present

## 2013-09-05 DIAGNOSIS — E11329 Type 2 diabetes mellitus with mild nonproliferative diabetic retinopathy without macular edema: Secondary | ICD-10-CM | POA: Diagnosis not present

## 2013-10-21 DIAGNOSIS — B351 Tinea unguium: Secondary | ICD-10-CM | POA: Diagnosis not present

## 2013-10-21 DIAGNOSIS — R059 Cough, unspecified: Secondary | ICD-10-CM | POA: Diagnosis not present

## 2013-10-21 DIAGNOSIS — B0223 Postherpetic polyneuropathy: Secondary | ICD-10-CM | POA: Diagnosis not present

## 2013-10-21 DIAGNOSIS — E1165 Type 2 diabetes mellitus with hyperglycemia: Secondary | ICD-10-CM | POA: Diagnosis not present

## 2013-10-21 DIAGNOSIS — E1129 Type 2 diabetes mellitus with other diabetic kidney complication: Secondary | ICD-10-CM | POA: Diagnosis not present

## 2013-10-21 DIAGNOSIS — R05 Cough: Secondary | ICD-10-CM | POA: Diagnosis not present

## 2013-10-21 DIAGNOSIS — E038 Other specified hypothyroidism: Secondary | ICD-10-CM | POA: Diagnosis not present

## 2013-10-21 DIAGNOSIS — R42 Dizziness and giddiness: Secondary | ICD-10-CM | POA: Diagnosis not present

## 2013-10-21 DIAGNOSIS — E7800 Pure hypercholesterolemia, unspecified: Secondary | ICD-10-CM | POA: Diagnosis not present

## 2013-10-30 DIAGNOSIS — S72009A Fracture of unspecified part of neck of unspecified femur, initial encounter for closed fracture: Secondary | ICD-10-CM | POA: Diagnosis not present

## 2013-10-30 DIAGNOSIS — S298XXA Other specified injuries of thorax, initial encounter: Secondary | ICD-10-CM | POA: Diagnosis not present

## 2013-10-30 DIAGNOSIS — Z96649 Presence of unspecified artificial hip joint: Secondary | ICD-10-CM | POA: Diagnosis not present

## 2013-10-30 DIAGNOSIS — E119 Type 2 diabetes mellitus without complications: Secondary | ICD-10-CM | POA: Diagnosis not present

## 2013-10-30 DIAGNOSIS — M2550 Pain in unspecified joint: Secondary | ICD-10-CM | POA: Diagnosis not present

## 2013-10-30 DIAGNOSIS — S72009D Fracture of unspecified part of neck of unspecified femur, subsequent encounter for closed fracture with routine healing: Secondary | ICD-10-CM | POA: Diagnosis not present

## 2013-10-30 DIAGNOSIS — Z87891 Personal history of nicotine dependence: Secondary | ICD-10-CM | POA: Diagnosis not present

## 2013-10-30 DIAGNOSIS — S0990XA Unspecified injury of head, initial encounter: Secondary | ICD-10-CM | POA: Diagnosis not present

## 2013-10-30 DIAGNOSIS — S79929A Unspecified injury of unspecified thigh, initial encounter: Secondary | ICD-10-CM | POA: Diagnosis not present

## 2013-10-30 DIAGNOSIS — R9431 Abnormal electrocardiogram [ECG] [EKG]: Secondary | ICD-10-CM | POA: Diagnosis not present

## 2013-10-30 DIAGNOSIS — Z8551 Personal history of malignant neoplasm of bladder: Secondary | ICD-10-CM | POA: Diagnosis not present

## 2013-10-30 DIAGNOSIS — Z471 Aftercare following joint replacement surgery: Secondary | ICD-10-CM | POA: Diagnosis not present

## 2013-10-30 DIAGNOSIS — S8990XA Unspecified injury of unspecified lower leg, initial encounter: Secondary | ICD-10-CM | POA: Diagnosis not present

## 2013-10-30 DIAGNOSIS — S99919A Unspecified injury of unspecified ankle, initial encounter: Secondary | ICD-10-CM | POA: Diagnosis not present

## 2013-10-30 DIAGNOSIS — R296 Repeated falls: Secondary | ICD-10-CM | POA: Diagnosis not present

## 2013-10-30 DIAGNOSIS — I10 Essential (primary) hypertension: Secondary | ICD-10-CM | POA: Diagnosis present

## 2013-10-30 DIAGNOSIS — Z7401 Bed confinement status: Secondary | ICD-10-CM | POA: Diagnosis not present

## 2013-10-30 DIAGNOSIS — S72043A Displaced fracture of base of neck of unspecified femur, initial encounter for closed fracture: Secondary | ICD-10-CM | POA: Diagnosis not present

## 2013-10-30 DIAGNOSIS — B0229 Other postherpetic nervous system involvement: Secondary | ICD-10-CM | POA: Diagnosis not present

## 2013-10-30 DIAGNOSIS — E7800 Pure hypercholesterolemia, unspecified: Secondary | ICD-10-CM | POA: Diagnosis not present

## 2013-10-30 DIAGNOSIS — S72033A Displaced midcervical fracture of unspecified femur, initial encounter for closed fracture: Secondary | ICD-10-CM | POA: Diagnosis not present

## 2013-10-30 DIAGNOSIS — E559 Vitamin D deficiency, unspecified: Secondary | ICD-10-CM | POA: Diagnosis not present

## 2013-10-30 DIAGNOSIS — M6281 Muscle weakness (generalized): Secondary | ICD-10-CM | POA: Diagnosis not present

## 2013-10-30 DIAGNOSIS — E319 Polyglandular dysfunction, unspecified: Secondary | ICD-10-CM | POA: Diagnosis not present

## 2013-10-30 DIAGNOSIS — M79609 Pain in unspecified limb: Secondary | ICD-10-CM | POA: Diagnosis not present

## 2013-10-30 DIAGNOSIS — E039 Hypothyroidism, unspecified: Secondary | ICD-10-CM | POA: Diagnosis not present

## 2013-10-30 DIAGNOSIS — S79919A Unspecified injury of unspecified hip, initial encounter: Secondary | ICD-10-CM | POA: Diagnosis not present

## 2013-10-30 DIAGNOSIS — M810 Age-related osteoporosis without current pathological fracture: Secondary | ICD-10-CM | POA: Diagnosis present

## 2013-10-31 HISTORY — PX: HEMIARTHROPLASTY HIP: SUR652

## 2013-11-03 DIAGNOSIS — E559 Vitamin D deficiency, unspecified: Secondary | ICD-10-CM | POA: Diagnosis not present

## 2013-11-03 DIAGNOSIS — E039 Hypothyroidism, unspecified: Secondary | ICD-10-CM | POA: Diagnosis not present

## 2013-11-03 DIAGNOSIS — E7800 Pure hypercholesterolemia, unspecified: Secondary | ICD-10-CM | POA: Diagnosis not present

## 2013-11-03 DIAGNOSIS — S72009A Fracture of unspecified part of neck of unspecified femur, initial encounter for closed fracture: Secondary | ICD-10-CM | POA: Diagnosis not present

## 2013-11-03 DIAGNOSIS — E319 Polyglandular dysfunction, unspecified: Secondary | ICD-10-CM | POA: Diagnosis not present

## 2013-11-03 DIAGNOSIS — M2550 Pain in unspecified joint: Secondary | ICD-10-CM | POA: Diagnosis not present

## 2013-11-03 DIAGNOSIS — Z7401 Bed confinement status: Secondary | ICD-10-CM | POA: Diagnosis not present

## 2013-11-03 DIAGNOSIS — S72009D Fracture of unspecified part of neck of unspecified femur, subsequent encounter for closed fracture with routine healing: Secondary | ICD-10-CM | POA: Diagnosis not present

## 2013-11-03 DIAGNOSIS — E119 Type 2 diabetes mellitus without complications: Secondary | ICD-10-CM | POA: Diagnosis not present

## 2013-11-03 DIAGNOSIS — S72033A Displaced midcervical fracture of unspecified femur, initial encounter for closed fracture: Secondary | ICD-10-CM | POA: Diagnosis not present

## 2013-11-03 DIAGNOSIS — M6281 Muscle weakness (generalized): Secondary | ICD-10-CM | POA: Diagnosis not present

## 2013-11-16 DIAGNOSIS — Z96649 Presence of unspecified artificial hip joint: Secondary | ICD-10-CM | POA: Diagnosis not present

## 2013-11-16 DIAGNOSIS — IMO0001 Reserved for inherently not codable concepts without codable children: Secondary | ICD-10-CM | POA: Diagnosis not present

## 2013-11-16 DIAGNOSIS — M6281 Muscle weakness (generalized): Secondary | ICD-10-CM | POA: Diagnosis not present

## 2013-11-16 DIAGNOSIS — E119 Type 2 diabetes mellitus without complications: Secondary | ICD-10-CM | POA: Diagnosis not present

## 2013-11-16 DIAGNOSIS — R262 Difficulty in walking, not elsewhere classified: Secondary | ICD-10-CM | POA: Diagnosis not present

## 2013-11-16 DIAGNOSIS — Z471 Aftercare following joint replacement surgery: Secondary | ICD-10-CM | POA: Diagnosis not present

## 2013-11-18 DIAGNOSIS — Z471 Aftercare following joint replacement surgery: Secondary | ICD-10-CM | POA: Diagnosis not present

## 2013-11-18 DIAGNOSIS — R262 Difficulty in walking, not elsewhere classified: Secondary | ICD-10-CM | POA: Diagnosis not present

## 2013-11-18 DIAGNOSIS — IMO0001 Reserved for inherently not codable concepts without codable children: Secondary | ICD-10-CM | POA: Diagnosis not present

## 2013-11-18 DIAGNOSIS — M6281 Muscle weakness (generalized): Secondary | ICD-10-CM | POA: Diagnosis not present

## 2013-11-18 DIAGNOSIS — Z96649 Presence of unspecified artificial hip joint: Secondary | ICD-10-CM | POA: Diagnosis not present

## 2013-11-18 DIAGNOSIS — E119 Type 2 diabetes mellitus without complications: Secondary | ICD-10-CM | POA: Diagnosis not present

## 2013-11-19 DIAGNOSIS — R5381 Other malaise: Secondary | ICD-10-CM | POA: Diagnosis not present

## 2013-11-19 DIAGNOSIS — E1129 Type 2 diabetes mellitus with other diabetic kidney complication: Secondary | ICD-10-CM | POA: Diagnosis not present

## 2013-11-19 DIAGNOSIS — R5383 Other fatigue: Secondary | ICD-10-CM | POA: Diagnosis not present

## 2013-11-19 DIAGNOSIS — D509 Iron deficiency anemia, unspecified: Secondary | ICD-10-CM | POA: Diagnosis not present

## 2013-11-19 DIAGNOSIS — S72009A Fracture of unspecified part of neck of unspecified femur, initial encounter for closed fracture: Secondary | ICD-10-CM | POA: Diagnosis not present

## 2013-11-19 DIAGNOSIS — D649 Anemia, unspecified: Secondary | ICD-10-CM | POA: Diagnosis not present

## 2013-11-19 DIAGNOSIS — E538 Deficiency of other specified B group vitamins: Secondary | ICD-10-CM | POA: Diagnosis not present

## 2013-11-20 DIAGNOSIS — IMO0001 Reserved for inherently not codable concepts without codable children: Secondary | ICD-10-CM | POA: Diagnosis not present

## 2013-11-20 DIAGNOSIS — Z96649 Presence of unspecified artificial hip joint: Secondary | ICD-10-CM | POA: Diagnosis not present

## 2013-11-20 DIAGNOSIS — E119 Type 2 diabetes mellitus without complications: Secondary | ICD-10-CM | POA: Diagnosis not present

## 2013-11-20 DIAGNOSIS — Z471 Aftercare following joint replacement surgery: Secondary | ICD-10-CM | POA: Diagnosis not present

## 2013-11-20 DIAGNOSIS — R262 Difficulty in walking, not elsewhere classified: Secondary | ICD-10-CM | POA: Diagnosis not present

## 2013-11-20 DIAGNOSIS — M6281 Muscle weakness (generalized): Secondary | ICD-10-CM | POA: Diagnosis not present

## 2013-11-21 DIAGNOSIS — R262 Difficulty in walking, not elsewhere classified: Secondary | ICD-10-CM | POA: Diagnosis not present

## 2013-11-21 DIAGNOSIS — IMO0001 Reserved for inherently not codable concepts without codable children: Secondary | ICD-10-CM | POA: Diagnosis not present

## 2013-11-21 DIAGNOSIS — Z96649 Presence of unspecified artificial hip joint: Secondary | ICD-10-CM | POA: Diagnosis not present

## 2013-11-21 DIAGNOSIS — M6281 Muscle weakness (generalized): Secondary | ICD-10-CM | POA: Diagnosis not present

## 2013-11-21 DIAGNOSIS — E119 Type 2 diabetes mellitus without complications: Secondary | ICD-10-CM | POA: Diagnosis not present

## 2013-11-21 DIAGNOSIS — Z471 Aftercare following joint replacement surgery: Secondary | ICD-10-CM | POA: Diagnosis not present

## 2013-11-24 DIAGNOSIS — Z471 Aftercare following joint replacement surgery: Secondary | ICD-10-CM | POA: Diagnosis not present

## 2013-11-24 DIAGNOSIS — E119 Type 2 diabetes mellitus without complications: Secondary | ICD-10-CM | POA: Diagnosis not present

## 2013-11-24 DIAGNOSIS — Z96649 Presence of unspecified artificial hip joint: Secondary | ICD-10-CM | POA: Diagnosis not present

## 2013-11-24 DIAGNOSIS — R262 Difficulty in walking, not elsewhere classified: Secondary | ICD-10-CM | POA: Diagnosis not present

## 2013-11-24 DIAGNOSIS — IMO0001 Reserved for inherently not codable concepts without codable children: Secondary | ICD-10-CM | POA: Diagnosis not present

## 2013-11-24 DIAGNOSIS — M6281 Muscle weakness (generalized): Secondary | ICD-10-CM | POA: Diagnosis not present

## 2013-11-26 DIAGNOSIS — S72043A Displaced fracture of base of neck of unspecified femur, initial encounter for closed fracture: Secondary | ICD-10-CM | POA: Diagnosis not present

## 2013-12-03 DIAGNOSIS — S72043A Displaced fracture of base of neck of unspecified femur, initial encounter for closed fracture: Secondary | ICD-10-CM | POA: Diagnosis not present

## 2013-12-05 DIAGNOSIS — S72043A Displaced fracture of base of neck of unspecified femur, initial encounter for closed fracture: Secondary | ICD-10-CM | POA: Diagnosis not present

## 2013-12-10 DIAGNOSIS — S72043A Displaced fracture of base of neck of unspecified femur, initial encounter for closed fracture: Secondary | ICD-10-CM | POA: Diagnosis not present

## 2013-12-12 DIAGNOSIS — S72043A Displaced fracture of base of neck of unspecified femur, initial encounter for closed fracture: Secondary | ICD-10-CM | POA: Diagnosis not present

## 2013-12-16 DIAGNOSIS — S72043A Displaced fracture of base of neck of unspecified femur, initial encounter for closed fracture: Secondary | ICD-10-CM | POA: Diagnosis not present

## 2013-12-18 DIAGNOSIS — S72043A Displaced fracture of base of neck of unspecified femur, initial encounter for closed fracture: Secondary | ICD-10-CM | POA: Diagnosis not present

## 2013-12-23 DIAGNOSIS — S72043A Displaced fracture of base of neck of unspecified femur, initial encounter for closed fracture: Secondary | ICD-10-CM | POA: Diagnosis not present

## 2013-12-30 DIAGNOSIS — S72043A Displaced fracture of base of neck of unspecified femur, initial encounter for closed fracture: Secondary | ICD-10-CM | POA: Diagnosis not present

## 2013-12-30 DIAGNOSIS — G250 Essential tremor: Secondary | ICD-10-CM | POA: Diagnosis not present

## 2013-12-30 DIAGNOSIS — G252 Other specified forms of tremor: Secondary | ICD-10-CM | POA: Diagnosis not present

## 2013-12-30 DIAGNOSIS — B0229 Other postherpetic nervous system involvement: Secondary | ICD-10-CM | POA: Diagnosis not present

## 2013-12-30 DIAGNOSIS — G5720 Lesion of femoral nerve, unspecified lower limb: Secondary | ICD-10-CM | POA: Diagnosis not present

## 2014-01-01 DIAGNOSIS — S72043A Displaced fracture of base of neck of unspecified femur, initial encounter for closed fracture: Secondary | ICD-10-CM | POA: Diagnosis not present

## 2014-01-06 DIAGNOSIS — S72043A Displaced fracture of base of neck of unspecified femur, initial encounter for closed fracture: Secondary | ICD-10-CM | POA: Diagnosis not present

## 2014-01-08 DIAGNOSIS — S72043A Displaced fracture of base of neck of unspecified femur, initial encounter for closed fracture: Secondary | ICD-10-CM | POA: Diagnosis not present

## 2014-01-13 DIAGNOSIS — S72043A Displaced fracture of base of neck of unspecified femur, initial encounter for closed fracture: Secondary | ICD-10-CM | POA: Diagnosis not present

## 2014-01-14 DIAGNOSIS — R972 Elevated prostate specific antigen [PSA]: Secondary | ICD-10-CM | POA: Diagnosis not present

## 2014-01-14 DIAGNOSIS — N200 Calculus of kidney: Secondary | ICD-10-CM | POA: Diagnosis not present

## 2014-01-14 DIAGNOSIS — R935 Abnormal findings on diagnostic imaging of other abdominal regions, including retroperitoneum: Secondary | ICD-10-CM | POA: Diagnosis not present

## 2014-01-14 DIAGNOSIS — C679 Malignant neoplasm of bladder, unspecified: Secondary | ICD-10-CM | POA: Diagnosis not present

## 2014-01-15 DIAGNOSIS — S72043A Displaced fracture of base of neck of unspecified femur, initial encounter for closed fracture: Secondary | ICD-10-CM | POA: Diagnosis not present

## 2014-01-20 DIAGNOSIS — S72043A Displaced fracture of base of neck of unspecified femur, initial encounter for closed fracture: Secondary | ICD-10-CM | POA: Diagnosis not present

## 2014-01-21 DIAGNOSIS — C679 Malignant neoplasm of bladder, unspecified: Secondary | ICD-10-CM | POA: Diagnosis not present

## 2014-01-21 DIAGNOSIS — C670 Malignant neoplasm of trigone of bladder: Secondary | ICD-10-CM | POA: Diagnosis not present

## 2014-01-22 DIAGNOSIS — S72043A Displaced fracture of base of neck of unspecified femur, initial encounter for closed fracture: Secondary | ICD-10-CM | POA: Diagnosis not present

## 2014-01-23 DIAGNOSIS — Z96649 Presence of unspecified artificial hip joint: Secondary | ICD-10-CM | POA: Diagnosis not present

## 2014-01-23 DIAGNOSIS — S72009A Fracture of unspecified part of neck of unspecified femur, initial encounter for closed fracture: Secondary | ICD-10-CM | POA: Diagnosis not present

## 2014-01-27 DIAGNOSIS — S72043A Displaced fracture of base of neck of unspecified femur, initial encounter for closed fracture: Secondary | ICD-10-CM | POA: Diagnosis not present

## 2014-01-28 DIAGNOSIS — M25819 Other specified joint disorders, unspecified shoulder: Secondary | ICD-10-CM | POA: Diagnosis not present

## 2014-01-29 DIAGNOSIS — S72043A Displaced fracture of base of neck of unspecified femur, initial encounter for closed fracture: Secondary | ICD-10-CM | POA: Diagnosis not present

## 2014-02-03 DIAGNOSIS — S72043A Displaced fracture of base of neck of unspecified femur, initial encounter for closed fracture: Secondary | ICD-10-CM | POA: Diagnosis not present

## 2014-02-04 DIAGNOSIS — E1129 Type 2 diabetes mellitus with other diabetic kidney complication: Secondary | ICD-10-CM | POA: Diagnosis not present

## 2014-02-04 DIAGNOSIS — B0223 Postherpetic polyneuropathy: Secondary | ICD-10-CM | POA: Diagnosis not present

## 2014-02-04 DIAGNOSIS — E1149 Type 2 diabetes mellitus with other diabetic neurological complication: Secondary | ICD-10-CM | POA: Diagnosis not present

## 2014-02-04 DIAGNOSIS — I10 Essential (primary) hypertension: Secondary | ICD-10-CM | POA: Diagnosis not present

## 2014-02-04 DIAGNOSIS — R059 Cough, unspecified: Secondary | ICD-10-CM | POA: Diagnosis not present

## 2014-02-04 DIAGNOSIS — R05 Cough: Secondary | ICD-10-CM | POA: Diagnosis not present

## 2014-02-04 DIAGNOSIS — B351 Tinea unguium: Secondary | ICD-10-CM | POA: Diagnosis not present

## 2014-02-04 DIAGNOSIS — J309 Allergic rhinitis, unspecified: Secondary | ICD-10-CM | POA: Diagnosis not present

## 2014-02-04 DIAGNOSIS — E538 Deficiency of other specified B group vitamins: Secondary | ICD-10-CM | POA: Diagnosis not present

## 2014-02-04 DIAGNOSIS — E7800 Pure hypercholesterolemia, unspecified: Secondary | ICD-10-CM | POA: Diagnosis not present

## 2014-02-05 DIAGNOSIS — S72043A Displaced fracture of base of neck of unspecified femur, initial encounter for closed fracture: Secondary | ICD-10-CM | POA: Diagnosis not present

## 2014-02-10 DIAGNOSIS — S72043A Displaced fracture of base of neck of unspecified femur, initial encounter for closed fracture: Secondary | ICD-10-CM | POA: Diagnosis not present

## 2014-02-12 DIAGNOSIS — S72043A Displaced fracture of base of neck of unspecified femur, initial encounter for closed fracture: Secondary | ICD-10-CM | POA: Diagnosis not present

## 2014-02-17 DIAGNOSIS — Z23 Encounter for immunization: Secondary | ICD-10-CM | POA: Diagnosis not present

## 2014-02-17 DIAGNOSIS — S72043A Displaced fracture of base of neck of unspecified femur, initial encounter for closed fracture: Secondary | ICD-10-CM | POA: Diagnosis not present

## 2014-02-19 DIAGNOSIS — S72043A Displaced fracture of base of neck of unspecified femur, initial encounter for closed fracture: Secondary | ICD-10-CM | POA: Diagnosis not present

## 2014-02-24 DIAGNOSIS — S72043A Displaced fracture of base of neck of unspecified femur, initial encounter for closed fracture: Secondary | ICD-10-CM | POA: Diagnosis not present

## 2014-02-26 DIAGNOSIS — S72043A Displaced fracture of base of neck of unspecified femur, initial encounter for closed fracture: Secondary | ICD-10-CM | POA: Diagnosis not present

## 2014-03-03 DIAGNOSIS — S72043A Displaced fracture of base of neck of unspecified femur, initial encounter for closed fracture: Secondary | ICD-10-CM | POA: Diagnosis not present

## 2014-03-10 DIAGNOSIS — E11359 Type 2 diabetes mellitus with proliferative diabetic retinopathy without macular edema: Secondary | ICD-10-CM | POA: Diagnosis not present

## 2014-03-10 DIAGNOSIS — E1139 Type 2 diabetes mellitus with other diabetic ophthalmic complication: Secondary | ICD-10-CM | POA: Diagnosis not present

## 2014-04-09 DIAGNOSIS — E11349 Type 2 diabetes mellitus with severe nonproliferative diabetic retinopathy without macular edema: Secondary | ICD-10-CM | POA: Diagnosis not present

## 2014-04-09 DIAGNOSIS — E11339 Type 2 diabetes mellitus with moderate nonproliferative diabetic retinopathy without macular edema: Secondary | ICD-10-CM | POA: Diagnosis not present

## 2014-04-27 DIAGNOSIS — I10 Essential (primary) hypertension: Secondary | ICD-10-CM | POA: Diagnosis not present

## 2014-04-27 DIAGNOSIS — Z96649 Presence of unspecified artificial hip joint: Secondary | ICD-10-CM | POA: Diagnosis not present

## 2014-04-27 DIAGNOSIS — C4492 Squamous cell carcinoma of skin, unspecified: Secondary | ICD-10-CM | POA: Diagnosis not present

## 2014-04-27 DIAGNOSIS — L84 Corns and callosities: Secondary | ICD-10-CM | POA: Diagnosis not present

## 2014-04-28 DIAGNOSIS — L738 Other specified follicular disorders: Secondary | ICD-10-CM | POA: Diagnosis not present

## 2014-04-28 DIAGNOSIS — D492 Neoplasm of unspecified behavior of bone, soft tissue, and skin: Secondary | ICD-10-CM | POA: Diagnosis not present

## 2014-05-08 DIAGNOSIS — M1611 Unilateral primary osteoarthritis, right hip: Secondary | ICD-10-CM | POA: Diagnosis not present

## 2014-05-19 DIAGNOSIS — J300 Vasomotor rhinitis: Secondary | ICD-10-CM | POA: Diagnosis not present

## 2014-05-19 DIAGNOSIS — F5221 Male erectile disorder: Secondary | ICD-10-CM | POA: Diagnosis not present

## 2014-05-19 DIAGNOSIS — E119 Type 2 diabetes mellitus without complications: Secondary | ICD-10-CM | POA: Diagnosis not present

## 2014-05-19 DIAGNOSIS — E1121 Type 2 diabetes mellitus with diabetic nephropathy: Secondary | ICD-10-CM | POA: Diagnosis not present

## 2014-05-19 DIAGNOSIS — G4730 Sleep apnea, unspecified: Secondary | ICD-10-CM | POA: Diagnosis not present

## 2014-05-19 DIAGNOSIS — E780 Pure hypercholesterolemia: Secondary | ICD-10-CM | POA: Diagnosis not present

## 2014-05-19 DIAGNOSIS — R5383 Other fatigue: Secondary | ICD-10-CM | POA: Diagnosis not present

## 2014-05-19 DIAGNOSIS — B0223 Postherpetic polyneuropathy: Secondary | ICD-10-CM | POA: Diagnosis not present

## 2014-05-19 DIAGNOSIS — I10 Essential (primary) hypertension: Secondary | ICD-10-CM | POA: Diagnosis not present

## 2014-05-19 DIAGNOSIS — E1165 Type 2 diabetes mellitus with hyperglycemia: Secondary | ICD-10-CM | POA: Diagnosis not present

## 2014-05-26 DIAGNOSIS — L570 Actinic keratosis: Secondary | ICD-10-CM | POA: Diagnosis not present

## 2014-05-26 DIAGNOSIS — L578 Other skin changes due to chronic exposure to nonionizing radiation: Secondary | ICD-10-CM | POA: Diagnosis not present

## 2014-05-26 DIAGNOSIS — D485 Neoplasm of uncertain behavior of skin: Secondary | ICD-10-CM | POA: Diagnosis not present

## 2014-05-27 DIAGNOSIS — M9904 Segmental and somatic dysfunction of sacral region: Secondary | ICD-10-CM | POA: Diagnosis not present

## 2014-05-27 DIAGNOSIS — M5137 Other intervertebral disc degeneration, lumbosacral region: Secondary | ICD-10-CM | POA: Diagnosis not present

## 2014-05-27 DIAGNOSIS — M5442 Lumbago with sciatica, left side: Secondary | ICD-10-CM | POA: Diagnosis not present

## 2014-05-27 DIAGNOSIS — M9902 Segmental and somatic dysfunction of thoracic region: Secondary | ICD-10-CM | POA: Diagnosis not present

## 2014-05-27 DIAGNOSIS — M5136 Other intervertebral disc degeneration, lumbar region: Secondary | ICD-10-CM | POA: Diagnosis not present

## 2014-05-27 DIAGNOSIS — M9903 Segmental and somatic dysfunction of lumbar region: Secondary | ICD-10-CM | POA: Diagnosis not present

## 2014-05-28 DIAGNOSIS — M5442 Lumbago with sciatica, left side: Secondary | ICD-10-CM | POA: Diagnosis not present

## 2014-05-28 DIAGNOSIS — M5136 Other intervertebral disc degeneration, lumbar region: Secondary | ICD-10-CM | POA: Diagnosis not present

## 2014-05-28 DIAGNOSIS — M9904 Segmental and somatic dysfunction of sacral region: Secondary | ICD-10-CM | POA: Diagnosis not present

## 2014-05-28 DIAGNOSIS — M5137 Other intervertebral disc degeneration, lumbosacral region: Secondary | ICD-10-CM | POA: Diagnosis not present

## 2014-05-28 DIAGNOSIS — M9903 Segmental and somatic dysfunction of lumbar region: Secondary | ICD-10-CM | POA: Diagnosis not present

## 2014-05-28 DIAGNOSIS — M9902 Segmental and somatic dysfunction of thoracic region: Secondary | ICD-10-CM | POA: Diagnosis not present

## 2014-06-01 DIAGNOSIS — M9903 Segmental and somatic dysfunction of lumbar region: Secondary | ICD-10-CM | POA: Diagnosis not present

## 2014-06-01 DIAGNOSIS — M5442 Lumbago with sciatica, left side: Secondary | ICD-10-CM | POA: Diagnosis not present

## 2014-06-01 DIAGNOSIS — M5136 Other intervertebral disc degeneration, lumbar region: Secondary | ICD-10-CM | POA: Diagnosis not present

## 2014-06-01 DIAGNOSIS — M9902 Segmental and somatic dysfunction of thoracic region: Secondary | ICD-10-CM | POA: Diagnosis not present

## 2014-06-01 DIAGNOSIS — M9904 Segmental and somatic dysfunction of sacral region: Secondary | ICD-10-CM | POA: Diagnosis not present

## 2014-06-01 DIAGNOSIS — M5137 Other intervertebral disc degeneration, lumbosacral region: Secondary | ICD-10-CM | POA: Diagnosis not present

## 2014-06-03 DIAGNOSIS — M9902 Segmental and somatic dysfunction of thoracic region: Secondary | ICD-10-CM | POA: Diagnosis not present

## 2014-06-03 DIAGNOSIS — M5136 Other intervertebral disc degeneration, lumbar region: Secondary | ICD-10-CM | POA: Diagnosis not present

## 2014-06-03 DIAGNOSIS — M5137 Other intervertebral disc degeneration, lumbosacral region: Secondary | ICD-10-CM | POA: Diagnosis not present

## 2014-06-03 DIAGNOSIS — M5442 Lumbago with sciatica, left side: Secondary | ICD-10-CM | POA: Diagnosis not present

## 2014-06-03 DIAGNOSIS — M9904 Segmental and somatic dysfunction of sacral region: Secondary | ICD-10-CM | POA: Diagnosis not present

## 2014-06-03 DIAGNOSIS — M9903 Segmental and somatic dysfunction of lumbar region: Secondary | ICD-10-CM | POA: Diagnosis not present

## 2014-06-05 DIAGNOSIS — M5136 Other intervertebral disc degeneration, lumbar region: Secondary | ICD-10-CM | POA: Diagnosis not present

## 2014-06-05 DIAGNOSIS — M5137 Other intervertebral disc degeneration, lumbosacral region: Secondary | ICD-10-CM | POA: Diagnosis not present

## 2014-06-05 DIAGNOSIS — M9904 Segmental and somatic dysfunction of sacral region: Secondary | ICD-10-CM | POA: Diagnosis not present

## 2014-06-05 DIAGNOSIS — M9902 Segmental and somatic dysfunction of thoracic region: Secondary | ICD-10-CM | POA: Diagnosis not present

## 2014-06-05 DIAGNOSIS — M9903 Segmental and somatic dysfunction of lumbar region: Secondary | ICD-10-CM | POA: Diagnosis not present

## 2014-06-05 DIAGNOSIS — M5442 Lumbago with sciatica, left side: Secondary | ICD-10-CM | POA: Diagnosis not present

## 2014-06-08 DIAGNOSIS — M9904 Segmental and somatic dysfunction of sacral region: Secondary | ICD-10-CM | POA: Diagnosis not present

## 2014-06-08 DIAGNOSIS — M5137 Other intervertebral disc degeneration, lumbosacral region: Secondary | ICD-10-CM | POA: Diagnosis not present

## 2014-06-08 DIAGNOSIS — M5136 Other intervertebral disc degeneration, lumbar region: Secondary | ICD-10-CM | POA: Diagnosis not present

## 2014-06-08 DIAGNOSIS — M9902 Segmental and somatic dysfunction of thoracic region: Secondary | ICD-10-CM | POA: Diagnosis not present

## 2014-06-08 DIAGNOSIS — M9903 Segmental and somatic dysfunction of lumbar region: Secondary | ICD-10-CM | POA: Diagnosis not present

## 2014-06-08 DIAGNOSIS — M5442 Lumbago with sciatica, left side: Secondary | ICD-10-CM | POA: Diagnosis not present

## 2014-06-10 DIAGNOSIS — M5442 Lumbago with sciatica, left side: Secondary | ICD-10-CM | POA: Diagnosis not present

## 2014-06-10 DIAGNOSIS — M9902 Segmental and somatic dysfunction of thoracic region: Secondary | ICD-10-CM | POA: Diagnosis not present

## 2014-06-10 DIAGNOSIS — M5137 Other intervertebral disc degeneration, lumbosacral region: Secondary | ICD-10-CM | POA: Diagnosis not present

## 2014-06-10 DIAGNOSIS — M9904 Segmental and somatic dysfunction of sacral region: Secondary | ICD-10-CM | POA: Diagnosis not present

## 2014-06-10 DIAGNOSIS — M9903 Segmental and somatic dysfunction of lumbar region: Secondary | ICD-10-CM | POA: Diagnosis not present

## 2014-06-10 DIAGNOSIS — M5136 Other intervertebral disc degeneration, lumbar region: Secondary | ICD-10-CM | POA: Diagnosis not present

## 2014-06-15 DIAGNOSIS — M9903 Segmental and somatic dysfunction of lumbar region: Secondary | ICD-10-CM | POA: Diagnosis not present

## 2014-06-15 DIAGNOSIS — M5136 Other intervertebral disc degeneration, lumbar region: Secondary | ICD-10-CM | POA: Diagnosis not present

## 2014-06-15 DIAGNOSIS — M5442 Lumbago with sciatica, left side: Secondary | ICD-10-CM | POA: Diagnosis not present

## 2014-06-15 DIAGNOSIS — M5137 Other intervertebral disc degeneration, lumbosacral region: Secondary | ICD-10-CM | POA: Diagnosis not present

## 2014-06-15 DIAGNOSIS — M9904 Segmental and somatic dysfunction of sacral region: Secondary | ICD-10-CM | POA: Diagnosis not present

## 2014-06-15 DIAGNOSIS — M9902 Segmental and somatic dysfunction of thoracic region: Secondary | ICD-10-CM | POA: Diagnosis not present

## 2014-06-17 DIAGNOSIS — M9902 Segmental and somatic dysfunction of thoracic region: Secondary | ICD-10-CM | POA: Diagnosis not present

## 2014-06-17 DIAGNOSIS — M5137 Other intervertebral disc degeneration, lumbosacral region: Secondary | ICD-10-CM | POA: Diagnosis not present

## 2014-06-17 DIAGNOSIS — M5442 Lumbago with sciatica, left side: Secondary | ICD-10-CM | POA: Diagnosis not present

## 2014-06-17 DIAGNOSIS — M5136 Other intervertebral disc degeneration, lumbar region: Secondary | ICD-10-CM | POA: Diagnosis not present

## 2014-06-17 DIAGNOSIS — M9904 Segmental and somatic dysfunction of sacral region: Secondary | ICD-10-CM | POA: Diagnosis not present

## 2014-06-17 DIAGNOSIS — M9903 Segmental and somatic dysfunction of lumbar region: Secondary | ICD-10-CM | POA: Diagnosis not present

## 2014-06-22 DIAGNOSIS — M9904 Segmental and somatic dysfunction of sacral region: Secondary | ICD-10-CM | POA: Diagnosis not present

## 2014-06-22 DIAGNOSIS — M5137 Other intervertebral disc degeneration, lumbosacral region: Secondary | ICD-10-CM | POA: Diagnosis not present

## 2014-06-22 DIAGNOSIS — M5442 Lumbago with sciatica, left side: Secondary | ICD-10-CM | POA: Diagnosis not present

## 2014-06-22 DIAGNOSIS — M9903 Segmental and somatic dysfunction of lumbar region: Secondary | ICD-10-CM | POA: Diagnosis not present

## 2014-06-22 DIAGNOSIS — M9902 Segmental and somatic dysfunction of thoracic region: Secondary | ICD-10-CM | POA: Diagnosis not present

## 2014-06-22 DIAGNOSIS — M5136 Other intervertebral disc degeneration, lumbar region: Secondary | ICD-10-CM | POA: Diagnosis not present

## 2014-06-24 DIAGNOSIS — M5442 Lumbago with sciatica, left side: Secondary | ICD-10-CM | POA: Diagnosis not present

## 2014-06-24 DIAGNOSIS — M5137 Other intervertebral disc degeneration, lumbosacral region: Secondary | ICD-10-CM | POA: Diagnosis not present

## 2014-06-24 DIAGNOSIS — M5136 Other intervertebral disc degeneration, lumbar region: Secondary | ICD-10-CM | POA: Diagnosis not present

## 2014-06-24 DIAGNOSIS — M9902 Segmental and somatic dysfunction of thoracic region: Secondary | ICD-10-CM | POA: Diagnosis not present

## 2014-06-24 DIAGNOSIS — M9904 Segmental and somatic dysfunction of sacral region: Secondary | ICD-10-CM | POA: Diagnosis not present

## 2014-06-24 DIAGNOSIS — M9903 Segmental and somatic dysfunction of lumbar region: Secondary | ICD-10-CM | POA: Diagnosis not present

## 2014-06-26 DIAGNOSIS — M5137 Other intervertebral disc degeneration, lumbosacral region: Secondary | ICD-10-CM | POA: Diagnosis not present

## 2014-06-26 DIAGNOSIS — M5136 Other intervertebral disc degeneration, lumbar region: Secondary | ICD-10-CM | POA: Diagnosis not present

## 2014-06-26 DIAGNOSIS — M5442 Lumbago with sciatica, left side: Secondary | ICD-10-CM | POA: Diagnosis not present

## 2014-06-26 DIAGNOSIS — M9902 Segmental and somatic dysfunction of thoracic region: Secondary | ICD-10-CM | POA: Diagnosis not present

## 2014-06-26 DIAGNOSIS — M9903 Segmental and somatic dysfunction of lumbar region: Secondary | ICD-10-CM | POA: Diagnosis not present

## 2014-06-26 DIAGNOSIS — M9904 Segmental and somatic dysfunction of sacral region: Secondary | ICD-10-CM | POA: Diagnosis not present

## 2014-07-07 DIAGNOSIS — B351 Tinea unguium: Secondary | ICD-10-CM | POA: Diagnosis not present

## 2014-07-07 DIAGNOSIS — D485 Neoplasm of uncertain behavior of skin: Secondary | ICD-10-CM | POA: Diagnosis not present

## 2014-07-07 DIAGNOSIS — L570 Actinic keratosis: Secondary | ICD-10-CM | POA: Diagnosis not present

## 2014-07-09 DIAGNOSIS — E11349 Type 2 diabetes mellitus with severe nonproliferative diabetic retinopathy without macular edema: Secondary | ICD-10-CM | POA: Diagnosis not present

## 2014-07-17 DIAGNOSIS — N401 Enlarged prostate with lower urinary tract symptoms: Secondary | ICD-10-CM | POA: Diagnosis not present

## 2014-07-17 DIAGNOSIS — Z125 Encounter for screening for malignant neoplasm of prostate: Secondary | ICD-10-CM | POA: Diagnosis not present

## 2014-07-17 DIAGNOSIS — C679 Malignant neoplasm of bladder, unspecified: Secondary | ICD-10-CM | POA: Diagnosis not present

## 2014-07-29 DIAGNOSIS — Z96649 Presence of unspecified artificial hip joint: Secondary | ICD-10-CM | POA: Diagnosis not present

## 2014-08-20 DIAGNOSIS — E782 Mixed hyperlipidemia: Secondary | ICD-10-CM | POA: Diagnosis not present

## 2014-08-20 DIAGNOSIS — B0223 Postherpetic polyneuropathy: Secondary | ICD-10-CM | POA: Diagnosis not present

## 2014-08-20 DIAGNOSIS — I10 Essential (primary) hypertension: Secondary | ICD-10-CM | POA: Diagnosis not present

## 2014-08-20 DIAGNOSIS — M2041 Other hammer toe(s) (acquired), right foot: Secondary | ICD-10-CM | POA: Diagnosis not present

## 2014-08-20 DIAGNOSIS — E780 Pure hypercholesterolemia: Secondary | ICD-10-CM | POA: Diagnosis not present

## 2014-08-20 DIAGNOSIS — E1165 Type 2 diabetes mellitus with hyperglycemia: Secondary | ICD-10-CM | POA: Diagnosis not present

## 2014-08-20 DIAGNOSIS — M2042 Other hammer toe(s) (acquired), left foot: Secondary | ICD-10-CM | POA: Diagnosis not present

## 2014-08-20 DIAGNOSIS — G4730 Sleep apnea, unspecified: Secondary | ICD-10-CM | POA: Diagnosis not present

## 2014-08-20 DIAGNOSIS — E034 Atrophy of thyroid (acquired): Secondary | ICD-10-CM | POA: Diagnosis not present

## 2014-08-20 DIAGNOSIS — E119 Type 2 diabetes mellitus without complications: Secondary | ICD-10-CM | POA: Diagnosis not present

## 2014-09-02 DIAGNOSIS — G4730 Sleep apnea, unspecified: Secondary | ICD-10-CM | POA: Diagnosis not present

## 2014-09-28 DIAGNOSIS — L918 Other hypertrophic disorders of the skin: Secondary | ICD-10-CM | POA: Diagnosis not present

## 2014-10-29 DIAGNOSIS — Z96649 Presence of unspecified artificial hip joint: Secondary | ICD-10-CM | POA: Diagnosis not present

## 2014-12-01 DIAGNOSIS — E1165 Type 2 diabetes mellitus with hyperglycemia: Secondary | ICD-10-CM | POA: Diagnosis not present

## 2014-12-01 DIAGNOSIS — B358 Other dermatophytoses: Secondary | ICD-10-CM | POA: Diagnosis not present

## 2014-12-01 DIAGNOSIS — I10 Essential (primary) hypertension: Secondary | ICD-10-CM | POA: Diagnosis not present

## 2014-12-01 DIAGNOSIS — G4733 Obstructive sleep apnea (adult) (pediatric): Secondary | ICD-10-CM | POA: Diagnosis not present

## 2014-12-01 DIAGNOSIS — E034 Atrophy of thyroid (acquired): Secondary | ICD-10-CM | POA: Diagnosis not present

## 2014-12-01 DIAGNOSIS — E119 Type 2 diabetes mellitus without complications: Secondary | ICD-10-CM | POA: Diagnosis not present

## 2014-12-01 DIAGNOSIS — E780 Pure hypercholesterolemia: Secondary | ICD-10-CM | POA: Diagnosis not present

## 2014-12-03 DIAGNOSIS — R808 Other proteinuria: Secondary | ICD-10-CM | POA: Diagnosis not present

## 2014-12-14 DIAGNOSIS — E11349 Type 2 diabetes mellitus with severe nonproliferative diabetic retinopathy without macular edema: Secondary | ICD-10-CM | POA: Diagnosis not present

## 2015-01-05 DIAGNOSIS — L570 Actinic keratosis: Secondary | ICD-10-CM | POA: Diagnosis not present

## 2015-01-05 DIAGNOSIS — B351 Tinea unguium: Secondary | ICD-10-CM | POA: Diagnosis not present

## 2015-01-08 DIAGNOSIS — R1012 Left upper quadrant pain: Secondary | ICD-10-CM | POA: Diagnosis not present

## 2015-01-08 DIAGNOSIS — R109 Unspecified abdominal pain: Secondary | ICD-10-CM | POA: Diagnosis not present

## 2015-01-08 DIAGNOSIS — C679 Malignant neoplasm of bladder, unspecified: Secondary | ICD-10-CM | POA: Diagnosis not present

## 2015-01-08 DIAGNOSIS — N200 Calculus of kidney: Secondary | ICD-10-CM | POA: Diagnosis not present

## 2015-01-19 DIAGNOSIS — E034 Atrophy of thyroid (acquired): Secondary | ICD-10-CM | POA: Diagnosis not present

## 2015-01-19 DIAGNOSIS — E038 Other specified hypothyroidism: Secondary | ICD-10-CM | POA: Diagnosis not present

## 2015-02-01 DIAGNOSIS — M79644 Pain in right finger(s): Secondary | ICD-10-CM | POA: Diagnosis not present

## 2015-02-15 DIAGNOSIS — B351 Tinea unguium: Secondary | ICD-10-CM | POA: Diagnosis not present

## 2015-02-15 DIAGNOSIS — M2041 Other hammer toe(s) (acquired), right foot: Secondary | ICD-10-CM | POA: Diagnosis not present

## 2015-02-15 DIAGNOSIS — E1142 Type 2 diabetes mellitus with diabetic polyneuropathy: Secondary | ICD-10-CM | POA: Diagnosis not present

## 2015-02-15 DIAGNOSIS — M2042 Other hammer toe(s) (acquired), left foot: Secondary | ICD-10-CM | POA: Diagnosis not present

## 2015-03-19 DIAGNOSIS — I10 Essential (primary) hypertension: Secondary | ICD-10-CM | POA: Diagnosis not present

## 2015-03-19 DIAGNOSIS — E1165 Type 2 diabetes mellitus with hyperglycemia: Secondary | ICD-10-CM | POA: Diagnosis not present

## 2015-03-19 DIAGNOSIS — E782 Mixed hyperlipidemia: Secondary | ICD-10-CM | POA: Diagnosis not present

## 2015-03-22 DIAGNOSIS — E113299 Type 2 diabetes mellitus with mild nonproliferative diabetic retinopathy without macular edema, unspecified eye: Secondary | ICD-10-CM | POA: Diagnosis not present

## 2015-03-22 DIAGNOSIS — E113599 Type 2 diabetes mellitus with proliferative diabetic retinopathy without macular edema, unspecified eye: Secondary | ICD-10-CM | POA: Diagnosis not present

## 2015-03-23 DIAGNOSIS — G4733 Obstructive sleep apnea (adult) (pediatric): Secondary | ICD-10-CM | POA: Diagnosis not present

## 2015-03-23 DIAGNOSIS — I10 Essential (primary) hypertension: Secondary | ICD-10-CM | POA: Diagnosis not present

## 2015-03-23 DIAGNOSIS — F5102 Adjustment insomnia: Secondary | ICD-10-CM | POA: Diagnosis not present

## 2015-03-23 DIAGNOSIS — Z23 Encounter for immunization: Secondary | ICD-10-CM | POA: Diagnosis not present

## 2015-03-23 DIAGNOSIS — E784 Other hyperlipidemia: Secondary | ICD-10-CM | POA: Diagnosis not present

## 2015-03-23 DIAGNOSIS — E1165 Type 2 diabetes mellitus with hyperglycemia: Secondary | ICD-10-CM | POA: Diagnosis not present

## 2015-03-31 DIAGNOSIS — M6281 Muscle weakness (generalized): Secondary | ICD-10-CM | POA: Diagnosis not present

## 2015-03-31 DIAGNOSIS — G5722 Lesion of femoral nerve, left lower limb: Secondary | ICD-10-CM | POA: Diagnosis not present

## 2015-04-07 DIAGNOSIS — M5416 Radiculopathy, lumbar region: Secondary | ICD-10-CM | POA: Diagnosis not present

## 2015-04-07 DIAGNOSIS — M4806 Spinal stenosis, lumbar region: Secondary | ICD-10-CM | POA: Diagnosis not present

## 2015-04-09 DIAGNOSIS — M4806 Spinal stenosis, lumbar region: Secondary | ICD-10-CM | POA: Diagnosis not present

## 2015-04-09 DIAGNOSIS — M5416 Radiculopathy, lumbar region: Secondary | ICD-10-CM | POA: Diagnosis not present

## 2015-04-09 DIAGNOSIS — G5722 Lesion of femoral nerve, left lower limb: Secondary | ICD-10-CM | POA: Diagnosis not present

## 2015-04-29 DIAGNOSIS — E113391 Type 2 diabetes mellitus with moderate nonproliferative diabetic retinopathy without macular edema, right eye: Secondary | ICD-10-CM | POA: Diagnosis not present

## 2015-04-29 DIAGNOSIS — E113392 Type 2 diabetes mellitus with moderate nonproliferative diabetic retinopathy without macular edema, left eye: Secondary | ICD-10-CM | POA: Diagnosis not present

## 2015-04-29 DIAGNOSIS — E113393 Type 2 diabetes mellitus with moderate nonproliferative diabetic retinopathy without macular edema, bilateral: Secondary | ICD-10-CM | POA: Diagnosis not present

## 2015-07-01 DIAGNOSIS — E113312 Type 2 diabetes mellitus with moderate nonproliferative diabetic retinopathy with macular edema, left eye: Secondary | ICD-10-CM | POA: Diagnosis not present

## 2015-07-02 DIAGNOSIS — F5102 Adjustment insomnia: Secondary | ICD-10-CM | POA: Diagnosis not present

## 2015-07-02 DIAGNOSIS — I10 Essential (primary) hypertension: Secondary | ICD-10-CM | POA: Diagnosis not present

## 2015-07-02 DIAGNOSIS — G4733 Obstructive sleep apnea (adult) (pediatric): Secondary | ICD-10-CM | POA: Diagnosis not present

## 2015-07-02 DIAGNOSIS — R0602 Shortness of breath: Secondary | ICD-10-CM | POA: Diagnosis not present

## 2015-07-02 DIAGNOSIS — E1121 Type 2 diabetes mellitus with diabetic nephropathy: Secondary | ICD-10-CM | POA: Diagnosis not present

## 2015-07-02 DIAGNOSIS — E663 Overweight: Secondary | ICD-10-CM | POA: Diagnosis not present

## 2015-07-02 DIAGNOSIS — E782 Mixed hyperlipidemia: Secondary | ICD-10-CM | POA: Diagnosis not present

## 2015-07-02 DIAGNOSIS — Z6834 Body mass index (BMI) 34.0-34.9, adult: Secondary | ICD-10-CM | POA: Diagnosis not present

## 2015-07-02 DIAGNOSIS — E1165 Type 2 diabetes mellitus with hyperglycemia: Secondary | ICD-10-CM | POA: Diagnosis not present

## 2015-07-12 DIAGNOSIS — N401 Enlarged prostate with lower urinary tract symptoms: Secondary | ICD-10-CM | POA: Diagnosis not present

## 2015-07-12 DIAGNOSIS — C672 Malignant neoplasm of lateral wall of bladder: Secondary | ICD-10-CM | POA: Diagnosis not present

## 2015-07-14 DIAGNOSIS — E113312 Type 2 diabetes mellitus with moderate nonproliferative diabetic retinopathy with macular edema, left eye: Secondary | ICD-10-CM | POA: Diagnosis not present

## 2015-07-30 DIAGNOSIS — M79671 Pain in right foot: Secondary | ICD-10-CM | POA: Diagnosis not present

## 2015-07-30 DIAGNOSIS — F5102 Adjustment insomnia: Secondary | ICD-10-CM | POA: Diagnosis not present

## 2015-07-30 DIAGNOSIS — Z6834 Body mass index (BMI) 34.0-34.9, adult: Secondary | ICD-10-CM | POA: Diagnosis not present

## 2015-07-30 DIAGNOSIS — E1121 Type 2 diabetes mellitus with diabetic nephropathy: Secondary | ICD-10-CM | POA: Diagnosis not present

## 2015-07-30 DIAGNOSIS — E663 Overweight: Secondary | ICD-10-CM | POA: Diagnosis not present

## 2015-10-04 DIAGNOSIS — E663 Overweight: Secondary | ICD-10-CM | POA: Diagnosis not present

## 2015-10-04 DIAGNOSIS — E1142 Type 2 diabetes mellitus with diabetic polyneuropathy: Secondary | ICD-10-CM | POA: Diagnosis not present

## 2015-10-04 DIAGNOSIS — M25551 Pain in right hip: Secondary | ICD-10-CM | POA: Diagnosis not present

## 2015-10-04 DIAGNOSIS — E034 Atrophy of thyroid (acquired): Secondary | ICD-10-CM | POA: Diagnosis not present

## 2015-10-04 DIAGNOSIS — M25552 Pain in left hip: Secondary | ICD-10-CM | POA: Diagnosis not present

## 2015-10-04 DIAGNOSIS — E1121 Type 2 diabetes mellitus with diabetic nephropathy: Secondary | ICD-10-CM | POA: Diagnosis not present

## 2015-10-04 DIAGNOSIS — E782 Mixed hyperlipidemia: Secondary | ICD-10-CM | POA: Diagnosis not present

## 2015-10-04 DIAGNOSIS — Z6834 Body mass index (BMI) 34.0-34.9, adult: Secondary | ICD-10-CM | POA: Diagnosis not present

## 2015-10-04 DIAGNOSIS — M21751 Unequal limb length (acquired), right femur: Secondary | ICD-10-CM | POA: Diagnosis not present

## 2015-10-04 DIAGNOSIS — I10 Essential (primary) hypertension: Secondary | ICD-10-CM | POA: Diagnosis not present

## 2015-10-04 DIAGNOSIS — E038 Other specified hypothyroidism: Secondary | ICD-10-CM | POA: Diagnosis not present

## 2015-10-14 DIAGNOSIS — R2689 Other abnormalities of gait and mobility: Secondary | ICD-10-CM | POA: Diagnosis not present

## 2015-10-14 DIAGNOSIS — M6281 Muscle weakness (generalized): Secondary | ICD-10-CM | POA: Diagnosis not present

## 2015-10-21 DIAGNOSIS — H2589 Other age-related cataract: Secondary | ICD-10-CM | POA: Diagnosis not present

## 2015-10-21 DIAGNOSIS — E113312 Type 2 diabetes mellitus with moderate nonproliferative diabetic retinopathy with macular edema, left eye: Secondary | ICD-10-CM | POA: Diagnosis not present

## 2015-10-21 DIAGNOSIS — E113311 Type 2 diabetes mellitus with moderate nonproliferative diabetic retinopathy with macular edema, right eye: Secondary | ICD-10-CM | POA: Diagnosis not present

## 2015-11-05 DIAGNOSIS — F5101 Primary insomnia: Secondary | ICD-10-CM | POA: Diagnosis not present

## 2015-11-05 DIAGNOSIS — E1121 Type 2 diabetes mellitus with diabetic nephropathy: Secondary | ICD-10-CM | POA: Diagnosis not present

## 2015-11-05 DIAGNOSIS — Z6833 Body mass index (BMI) 33.0-33.9, adult: Secondary | ICD-10-CM | POA: Diagnosis not present

## 2015-11-05 DIAGNOSIS — R3911 Hesitancy of micturition: Secondary | ICD-10-CM | POA: Diagnosis not present

## 2015-11-11 DIAGNOSIS — E113312 Type 2 diabetes mellitus with moderate nonproliferative diabetic retinopathy with macular edema, left eye: Secondary | ICD-10-CM | POA: Diagnosis not present

## 2015-12-07 DIAGNOSIS — Z6833 Body mass index (BMI) 33.0-33.9, adult: Secondary | ICD-10-CM | POA: Diagnosis not present

## 2015-12-07 DIAGNOSIS — K5909 Other constipation: Secondary | ICD-10-CM | POA: Diagnosis not present

## 2015-12-07 DIAGNOSIS — E1121 Type 2 diabetes mellitus with diabetic nephropathy: Secondary | ICD-10-CM | POA: Diagnosis not present

## 2015-12-07 DIAGNOSIS — F5101 Primary insomnia: Secondary | ICD-10-CM | POA: Diagnosis not present

## 2015-12-07 DIAGNOSIS — Z1211 Encounter for screening for malignant neoplasm of colon: Secondary | ICD-10-CM | POA: Diagnosis not present

## 2015-12-16 DIAGNOSIS — E113393 Type 2 diabetes mellitus with moderate nonproliferative diabetic retinopathy without macular edema, bilateral: Secondary | ICD-10-CM | POA: Diagnosis not present

## 2016-01-06 DIAGNOSIS — L821 Other seborrheic keratosis: Secondary | ICD-10-CM | POA: Diagnosis not present

## 2016-01-06 DIAGNOSIS — L578 Other skin changes due to chronic exposure to nonionizing radiation: Secondary | ICD-10-CM | POA: Diagnosis not present

## 2016-01-06 DIAGNOSIS — L570 Actinic keratosis: Secondary | ICD-10-CM | POA: Diagnosis not present

## 2016-01-11 DIAGNOSIS — N401 Enlarged prostate with lower urinary tract symptoms: Secondary | ICD-10-CM | POA: Diagnosis not present

## 2016-01-11 DIAGNOSIS — C672 Malignant neoplasm of lateral wall of bladder: Secondary | ICD-10-CM | POA: Diagnosis not present

## 2016-01-11 DIAGNOSIS — Z125 Encounter for screening for malignant neoplasm of prostate: Secondary | ICD-10-CM | POA: Diagnosis not present

## 2016-02-03 DIAGNOSIS — Z86010 Personal history of colonic polyps: Secondary | ICD-10-CM | POA: Diagnosis not present

## 2016-02-03 DIAGNOSIS — Z1211 Encounter for screening for malignant neoplasm of colon: Secondary | ICD-10-CM | POA: Diagnosis not present

## 2016-02-11 DIAGNOSIS — E113293 Type 2 diabetes mellitus with mild nonproliferative diabetic retinopathy without macular edema, bilateral: Secondary | ICD-10-CM | POA: Diagnosis not present

## 2016-02-11 DIAGNOSIS — H25813 Combined forms of age-related cataract, bilateral: Secondary | ICD-10-CM | POA: Diagnosis not present

## 2016-02-17 ENCOUNTER — Other Ambulatory Visit: Payer: Self-pay

## 2016-02-24 DIAGNOSIS — Z86010 Personal history of colonic polyps: Secondary | ICD-10-CM | POA: Diagnosis not present

## 2016-02-24 DIAGNOSIS — K625 Hemorrhage of anus and rectum: Secondary | ICD-10-CM | POA: Diagnosis not present

## 2016-02-24 DIAGNOSIS — Z1211 Encounter for screening for malignant neoplasm of colon: Secondary | ICD-10-CM | POA: Diagnosis not present

## 2016-02-24 DIAGNOSIS — K5730 Diverticulosis of large intestine without perforation or abscess without bleeding: Secondary | ICD-10-CM | POA: Diagnosis not present

## 2016-02-24 DIAGNOSIS — D128 Benign neoplasm of rectum: Secondary | ICD-10-CM | POA: Diagnosis not present

## 2016-02-24 DIAGNOSIS — D123 Benign neoplasm of transverse colon: Secondary | ICD-10-CM | POA: Diagnosis not present

## 2016-02-24 DIAGNOSIS — K5900 Constipation, unspecified: Secondary | ICD-10-CM | POA: Diagnosis not present

## 2016-02-24 DIAGNOSIS — E113393 Type 2 diabetes mellitus with moderate nonproliferative diabetic retinopathy without macular edema, bilateral: Secondary | ICD-10-CM | POA: Diagnosis not present

## 2016-02-25 DIAGNOSIS — E034 Atrophy of thyroid (acquired): Secondary | ICD-10-CM | POA: Diagnosis not present

## 2016-02-25 DIAGNOSIS — I10 Essential (primary) hypertension: Secondary | ICD-10-CM | POA: Diagnosis not present

## 2016-02-25 DIAGNOSIS — E1121 Type 2 diabetes mellitus with diabetic nephropathy: Secondary | ICD-10-CM | POA: Diagnosis not present

## 2016-02-25 DIAGNOSIS — E782 Mixed hyperlipidemia: Secondary | ICD-10-CM | POA: Diagnosis not present

## 2016-02-28 DIAGNOSIS — M25561 Pain in right knee: Secondary | ICD-10-CM | POA: Diagnosis not present

## 2016-02-28 DIAGNOSIS — M25512 Pain in left shoulder: Secondary | ICD-10-CM | POA: Diagnosis not present

## 2016-02-28 DIAGNOSIS — F5101 Primary insomnia: Secondary | ICD-10-CM | POA: Diagnosis not present

## 2016-02-28 DIAGNOSIS — E782 Mixed hyperlipidemia: Secondary | ICD-10-CM | POA: Diagnosis not present

## 2016-02-28 DIAGNOSIS — E1121 Type 2 diabetes mellitus with diabetic nephropathy: Secondary | ICD-10-CM | POA: Diagnosis not present

## 2016-02-28 DIAGNOSIS — M7710 Lateral epicondylitis, unspecified elbow: Secondary | ICD-10-CM | POA: Diagnosis not present

## 2016-02-28 DIAGNOSIS — I10 Essential (primary) hypertension: Secondary | ICD-10-CM | POA: Diagnosis not present

## 2016-02-28 DIAGNOSIS — Z23 Encounter for immunization: Secondary | ICD-10-CM | POA: Diagnosis not present

## 2016-02-28 DIAGNOSIS — Z6833 Body mass index (BMI) 33.0-33.9, adult: Secondary | ICD-10-CM | POA: Diagnosis not present

## 2016-02-28 DIAGNOSIS — E034 Atrophy of thyroid (acquired): Secondary | ICD-10-CM | POA: Diagnosis not present

## 2016-02-28 DIAGNOSIS — K5909 Other constipation: Secondary | ICD-10-CM | POA: Diagnosis not present

## 2016-03-13 DIAGNOSIS — M25512 Pain in left shoulder: Secondary | ICD-10-CM | POA: Diagnosis not present

## 2016-03-13 DIAGNOSIS — M179 Osteoarthritis of knee, unspecified: Secondary | ICD-10-CM | POA: Diagnosis not present

## 2016-03-13 DIAGNOSIS — M1711 Unilateral primary osteoarthritis, right knee: Secondary | ICD-10-CM | POA: Diagnosis not present

## 2016-03-13 DIAGNOSIS — M25561 Pain in right knee: Secondary | ICD-10-CM | POA: Diagnosis not present

## 2016-03-13 DIAGNOSIS — M170 Bilateral primary osteoarthritis of knee: Secondary | ICD-10-CM | POA: Diagnosis not present

## 2016-03-13 DIAGNOSIS — M19012 Primary osteoarthritis, left shoulder: Secondary | ICD-10-CM | POA: Diagnosis not present

## 2016-03-21 DIAGNOSIS — Z6834 Body mass index (BMI) 34.0-34.9, adult: Secondary | ICD-10-CM | POA: Diagnosis not present

## 2016-03-21 DIAGNOSIS — M19012 Primary osteoarthritis, left shoulder: Secondary | ICD-10-CM | POA: Diagnosis not present

## 2016-03-21 DIAGNOSIS — E663 Overweight: Secondary | ICD-10-CM | POA: Diagnosis not present

## 2016-03-21 DIAGNOSIS — M1711 Unilateral primary osteoarthritis, right knee: Secondary | ICD-10-CM | POA: Diagnosis not present

## 2016-03-21 DIAGNOSIS — G5621 Lesion of ulnar nerve, right upper limb: Secondary | ICD-10-CM | POA: Diagnosis not present

## 2016-04-13 DIAGNOSIS — J018 Other acute sinusitis: Secondary | ICD-10-CM | POA: Diagnosis not present

## 2016-05-17 DIAGNOSIS — M1711 Unilateral primary osteoarthritis, right knee: Secondary | ICD-10-CM | POA: Diagnosis not present

## 2016-05-19 DIAGNOSIS — I4891 Unspecified atrial fibrillation: Secondary | ICD-10-CM

## 2016-05-19 DIAGNOSIS — I480 Paroxysmal atrial fibrillation: Secondary | ICD-10-CM | POA: Insufficient documentation

## 2016-05-19 HISTORY — DX: Unspecified atrial fibrillation: I48.91

## 2016-05-23 DIAGNOSIS — E113393 Type 2 diabetes mellitus with moderate nonproliferative diabetic retinopathy without macular edema, bilateral: Secondary | ICD-10-CM | POA: Diagnosis not present

## 2016-05-28 DIAGNOSIS — Z87442 Personal history of urinary calculi: Secondary | ICD-10-CM | POA: Diagnosis not present

## 2016-05-28 DIAGNOSIS — Z8551 Personal history of malignant neoplasm of bladder: Secondary | ICD-10-CM | POA: Diagnosis not present

## 2016-05-28 DIAGNOSIS — N200 Calculus of kidney: Secondary | ICD-10-CM | POA: Diagnosis not present

## 2016-05-28 DIAGNOSIS — N201 Calculus of ureter: Secondary | ICD-10-CM | POA: Diagnosis not present

## 2016-05-31 DIAGNOSIS — N200 Calculus of kidney: Secondary | ICD-10-CM | POA: Diagnosis not present

## 2016-05-31 DIAGNOSIS — R1032 Left lower quadrant pain: Secondary | ICD-10-CM | POA: Diagnosis not present

## 2016-06-01 DIAGNOSIS — N1330 Unspecified hydronephrosis: Secondary | ICD-10-CM | POA: Diagnosis not present

## 2016-06-01 DIAGNOSIS — G629 Polyneuropathy, unspecified: Secondary | ICD-10-CM | POA: Diagnosis not present

## 2016-06-01 DIAGNOSIS — E559 Vitamin D deficiency, unspecified: Secondary | ICD-10-CM | POA: Diagnosis not present

## 2016-06-01 DIAGNOSIS — N201 Calculus of ureter: Secondary | ICD-10-CM | POA: Diagnosis not present

## 2016-06-01 DIAGNOSIS — N132 Hydronephrosis with renal and ureteral calculous obstruction: Secondary | ICD-10-CM | POA: Diagnosis not present

## 2016-06-01 DIAGNOSIS — R1032 Left lower quadrant pain: Secondary | ICD-10-CM | POA: Diagnosis not present

## 2016-06-01 DIAGNOSIS — E119 Type 2 diabetes mellitus without complications: Secondary | ICD-10-CM | POA: Diagnosis not present

## 2016-06-01 DIAGNOSIS — I10 Essential (primary) hypertension: Secondary | ICD-10-CM | POA: Diagnosis not present

## 2016-06-01 DIAGNOSIS — N401 Enlarged prostate with lower urinary tract symptoms: Secondary | ICD-10-CM | POA: Diagnosis not present

## 2016-06-01 DIAGNOSIS — Z8551 Personal history of malignant neoplasm of bladder: Secondary | ICD-10-CM | POA: Diagnosis not present

## 2016-06-01 DIAGNOSIS — E079 Disorder of thyroid, unspecified: Secondary | ICD-10-CM | POA: Diagnosis not present

## 2016-06-01 DIAGNOSIS — I480 Paroxysmal atrial fibrillation: Secondary | ICD-10-CM | POA: Diagnosis not present

## 2016-06-01 DIAGNOSIS — Z79899 Other long term (current) drug therapy: Secondary | ICD-10-CM | POA: Diagnosis not present

## 2016-06-07 DIAGNOSIS — S60512A Abrasion of left hand, initial encounter: Secondary | ICD-10-CM | POA: Diagnosis not present

## 2016-06-07 DIAGNOSIS — I482 Chronic atrial fibrillation: Secondary | ICD-10-CM | POA: Diagnosis not present

## 2016-06-07 DIAGNOSIS — N201 Calculus of ureter: Secondary | ICD-10-CM | POA: Diagnosis not present

## 2016-06-07 DIAGNOSIS — S60511A Abrasion of right hand, initial encounter: Secondary | ICD-10-CM | POA: Diagnosis not present

## 2016-06-08 DIAGNOSIS — G250 Essential tremor: Secondary | ICD-10-CM | POA: Diagnosis not present

## 2016-06-08 DIAGNOSIS — I480 Paroxysmal atrial fibrillation: Secondary | ICD-10-CM

## 2016-06-08 DIAGNOSIS — E088 Diabetes mellitus due to underlying condition with unspecified complications: Secondary | ICD-10-CM

## 2016-06-08 DIAGNOSIS — Z01810 Encounter for preprocedural cardiovascular examination: Secondary | ICD-10-CM | POA: Diagnosis not present

## 2016-06-08 DIAGNOSIS — I10 Essential (primary) hypertension: Secondary | ICD-10-CM | POA: Insufficient documentation

## 2016-06-08 DIAGNOSIS — Z6832 Body mass index (BMI) 32.0-32.9, adult: Secondary | ICD-10-CM | POA: Diagnosis not present

## 2016-06-08 HISTORY — DX: Diabetes mellitus due to underlying condition with unspecified complications: E08.8

## 2016-06-08 HISTORY — DX: Paroxysmal atrial fibrillation: I48.0

## 2016-06-09 DIAGNOSIS — I679 Cerebrovascular disease, unspecified: Secondary | ICD-10-CM | POA: Diagnosis not present

## 2016-06-09 DIAGNOSIS — R410 Disorientation, unspecified: Secondary | ICD-10-CM | POA: Diagnosis not present

## 2016-06-09 DIAGNOSIS — G459 Transient cerebral ischemic attack, unspecified: Secondary | ICD-10-CM | POA: Diagnosis not present

## 2016-06-09 DIAGNOSIS — N200 Calculus of kidney: Secondary | ICD-10-CM | POA: Diagnosis not present

## 2016-06-15 DIAGNOSIS — Z79899 Other long term (current) drug therapy: Secondary | ICD-10-CM | POA: Diagnosis not present

## 2016-06-15 DIAGNOSIS — Z538 Procedure and treatment not carried out for other reasons: Secondary | ICD-10-CM | POA: Diagnosis not present

## 2016-06-15 DIAGNOSIS — R109 Unspecified abdominal pain: Secondary | ICD-10-CM | POA: Diagnosis not present

## 2016-06-15 DIAGNOSIS — E079 Disorder of thyroid, unspecified: Secondary | ICD-10-CM | POA: Diagnosis not present

## 2016-06-15 DIAGNOSIS — E119 Type 2 diabetes mellitus without complications: Secondary | ICD-10-CM | POA: Diagnosis not present

## 2016-06-15 DIAGNOSIS — N200 Calculus of kidney: Secondary | ICD-10-CM | POA: Diagnosis not present

## 2016-06-15 DIAGNOSIS — I10 Essential (primary) hypertension: Secondary | ICD-10-CM | POA: Diagnosis not present

## 2016-06-15 DIAGNOSIS — I209 Angina pectoris, unspecified: Secondary | ICD-10-CM | POA: Diagnosis not present

## 2016-06-15 DIAGNOSIS — G4730 Sleep apnea, unspecified: Secondary | ICD-10-CM | POA: Diagnosis not present

## 2016-06-15 DIAGNOSIS — E669 Obesity, unspecified: Secondary | ICD-10-CM | POA: Diagnosis not present

## 2016-06-16 DIAGNOSIS — E119 Type 2 diabetes mellitus without complications: Secondary | ICD-10-CM | POA: Diagnosis not present

## 2016-06-16 DIAGNOSIS — I209 Angina pectoris, unspecified: Secondary | ICD-10-CM | POA: Diagnosis not present

## 2016-06-16 DIAGNOSIS — N200 Calculus of kidney: Secondary | ICD-10-CM | POA: Diagnosis not present

## 2016-06-16 DIAGNOSIS — I10 Essential (primary) hypertension: Secondary | ICD-10-CM | POA: Diagnosis not present

## 2016-06-16 DIAGNOSIS — E669 Obesity, unspecified: Secondary | ICD-10-CM | POA: Diagnosis not present

## 2016-06-16 DIAGNOSIS — Z466 Encounter for fitting and adjustment of urinary device: Secondary | ICD-10-CM | POA: Diagnosis not present

## 2016-06-16 DIAGNOSIS — G4730 Sleep apnea, unspecified: Secondary | ICD-10-CM | POA: Diagnosis not present

## 2016-06-21 DIAGNOSIS — I081 Rheumatic disorders of both mitral and tricuspid valves: Secondary | ICD-10-CM | POA: Diagnosis not present

## 2016-06-21 DIAGNOSIS — I6523 Occlusion and stenosis of bilateral carotid arteries: Secondary | ICD-10-CM | POA: Diagnosis not present

## 2016-06-21 DIAGNOSIS — G459 Transient cerebral ischemic attack, unspecified: Secondary | ICD-10-CM | POA: Diagnosis not present

## 2016-06-21 DIAGNOSIS — I482 Chronic atrial fibrillation: Secondary | ICD-10-CM | POA: Diagnosis not present

## 2016-06-21 DIAGNOSIS — I63233 Cerebral infarction due to unspecified occlusion or stenosis of bilateral carotid arteries: Secondary | ICD-10-CM | POA: Diagnosis not present

## 2016-06-28 DIAGNOSIS — N200 Calculus of kidney: Secondary | ICD-10-CM | POA: Diagnosis not present

## 2016-06-28 DIAGNOSIS — N132 Hydronephrosis with renal and ureteral calculous obstruction: Secondary | ICD-10-CM | POA: Diagnosis not present

## 2016-06-29 DIAGNOSIS — N281 Cyst of kidney, acquired: Secondary | ICD-10-CM | POA: Diagnosis not present

## 2016-06-29 DIAGNOSIS — N200 Calculus of kidney: Secondary | ICD-10-CM | POA: Diagnosis not present

## 2016-06-30 DIAGNOSIS — Z79899 Other long term (current) drug therapy: Secondary | ICD-10-CM | POA: Diagnosis not present

## 2016-06-30 DIAGNOSIS — Z7982 Long term (current) use of aspirin: Secondary | ICD-10-CM | POA: Diagnosis not present

## 2016-06-30 DIAGNOSIS — E038 Other specified hypothyroidism: Secondary | ICD-10-CM | POA: Diagnosis not present

## 2016-06-30 DIAGNOSIS — R0602 Shortness of breath: Secondary | ICD-10-CM | POA: Diagnosis not present

## 2016-06-30 DIAGNOSIS — I493 Ventricular premature depolarization: Secondary | ICD-10-CM | POA: Diagnosis not present

## 2016-06-30 DIAGNOSIS — I10 Essential (primary) hypertension: Secondary | ICD-10-CM | POA: Diagnosis not present

## 2016-06-30 DIAGNOSIS — N200 Calculus of kidney: Secondary | ICD-10-CM | POA: Diagnosis not present

## 2016-06-30 DIAGNOSIS — R0600 Dyspnea, unspecified: Secondary | ICD-10-CM | POA: Diagnosis not present

## 2016-06-30 DIAGNOSIS — E7800 Pure hypercholesterolemia, unspecified: Secondary | ICD-10-CM | POA: Diagnosis not present

## 2016-06-30 DIAGNOSIS — E039 Hypothyroidism, unspecified: Secondary | ICD-10-CM | POA: Diagnosis not present

## 2016-06-30 DIAGNOSIS — I4891 Unspecified atrial fibrillation: Secondary | ICD-10-CM | POA: Diagnosis not present

## 2016-06-30 DIAGNOSIS — R079 Chest pain, unspecified: Secondary | ICD-10-CM | POA: Diagnosis not present

## 2016-06-30 DIAGNOSIS — I499 Cardiac arrhythmia, unspecified: Secondary | ICD-10-CM | POA: Diagnosis not present

## 2016-06-30 DIAGNOSIS — E119 Type 2 diabetes mellitus without complications: Secondary | ICD-10-CM | POA: Diagnosis not present

## 2016-06-30 DIAGNOSIS — Z8679 Personal history of other diseases of the circulatory system: Secondary | ICD-10-CM | POA: Diagnosis not present

## 2016-07-01 DIAGNOSIS — I10 Essential (primary) hypertension: Secondary | ICD-10-CM | POA: Diagnosis not present

## 2016-07-01 DIAGNOSIS — I119 Hypertensive heart disease without heart failure: Secondary | ICD-10-CM | POA: Diagnosis not present

## 2016-07-01 DIAGNOSIS — Z8679 Personal history of other diseases of the circulatory system: Secondary | ICD-10-CM | POA: Diagnosis not present

## 2016-07-01 DIAGNOSIS — N200 Calculus of kidney: Secondary | ICD-10-CM | POA: Diagnosis not present

## 2016-07-01 DIAGNOSIS — I499 Cardiac arrhythmia, unspecified: Secondary | ICD-10-CM | POA: Diagnosis not present

## 2016-07-01 DIAGNOSIS — I493 Ventricular premature depolarization: Secondary | ICD-10-CM | POA: Diagnosis not present

## 2016-07-01 DIAGNOSIS — R079 Chest pain, unspecified: Secondary | ICD-10-CM | POA: Diagnosis not present

## 2016-07-01 DIAGNOSIS — I480 Paroxysmal atrial fibrillation: Secondary | ICD-10-CM | POA: Diagnosis not present

## 2016-07-10 DIAGNOSIS — I10 Essential (primary) hypertension: Secondary | ICD-10-CM | POA: Diagnosis not present

## 2016-07-10 DIAGNOSIS — E088 Diabetes mellitus due to underlying condition with unspecified complications: Secondary | ICD-10-CM | POA: Diagnosis not present

## 2016-07-10 DIAGNOSIS — Z6832 Body mass index (BMI) 32.0-32.9, adult: Secondary | ICD-10-CM | POA: Diagnosis not present

## 2016-07-10 DIAGNOSIS — Z01810 Encounter for preprocedural cardiovascular examination: Secondary | ICD-10-CM | POA: Diagnosis not present

## 2016-07-10 DIAGNOSIS — I480 Paroxysmal atrial fibrillation: Secondary | ICD-10-CM | POA: Diagnosis not present

## 2016-07-14 DIAGNOSIS — E119 Type 2 diabetes mellitus without complications: Secondary | ICD-10-CM | POA: Diagnosis not present

## 2016-07-14 DIAGNOSIS — E079 Disorder of thyroid, unspecified: Secondary | ICD-10-CM | POA: Diagnosis not present

## 2016-07-14 DIAGNOSIS — I10 Essential (primary) hypertension: Secondary | ICD-10-CM | POA: Diagnosis not present

## 2016-07-14 DIAGNOSIS — N200 Calculus of kidney: Secondary | ICD-10-CM | POA: Diagnosis not present

## 2016-07-14 DIAGNOSIS — Z79899 Other long term (current) drug therapy: Secondary | ICD-10-CM | POA: Diagnosis not present

## 2016-07-14 DIAGNOSIS — I499 Cardiac arrhythmia, unspecified: Secondary | ICD-10-CM | POA: Diagnosis not present

## 2016-07-19 DIAGNOSIS — E782 Mixed hyperlipidemia: Secondary | ICD-10-CM | POA: Diagnosis not present

## 2016-07-19 DIAGNOSIS — E1121 Type 2 diabetes mellitus with diabetic nephropathy: Secondary | ICD-10-CM | POA: Diagnosis not present

## 2016-07-19 DIAGNOSIS — I10 Essential (primary) hypertension: Secondary | ICD-10-CM | POA: Diagnosis not present

## 2016-07-19 DIAGNOSIS — E034 Atrophy of thyroid (acquired): Secondary | ICD-10-CM | POA: Diagnosis not present

## 2016-07-20 DIAGNOSIS — N200 Calculus of kidney: Secondary | ICD-10-CM | POA: Diagnosis not present

## 2016-07-21 DIAGNOSIS — E1121 Type 2 diabetes mellitus with diabetic nephropathy: Secondary | ICD-10-CM | POA: Diagnosis not present

## 2016-07-21 DIAGNOSIS — J301 Allergic rhinitis due to pollen: Secondary | ICD-10-CM | POA: Diagnosis not present

## 2016-07-21 DIAGNOSIS — R109 Unspecified abdominal pain: Secondary | ICD-10-CM | POA: Diagnosis not present

## 2016-07-21 DIAGNOSIS — E034 Atrophy of thyroid (acquired): Secondary | ICD-10-CM | POA: Diagnosis not present

## 2016-07-21 DIAGNOSIS — H6123 Impacted cerumen, bilateral: Secondary | ICD-10-CM | POA: Diagnosis not present

## 2016-07-21 DIAGNOSIS — I482 Chronic atrial fibrillation: Secondary | ICD-10-CM | POA: Diagnosis not present

## 2016-07-21 DIAGNOSIS — M25561 Pain in right knee: Secondary | ICD-10-CM | POA: Diagnosis not present

## 2016-07-21 DIAGNOSIS — E782 Mixed hyperlipidemia: Secondary | ICD-10-CM | POA: Diagnosis not present

## 2016-07-21 DIAGNOSIS — I10 Essential (primary) hypertension: Secondary | ICD-10-CM | POA: Diagnosis not present

## 2016-07-21 DIAGNOSIS — N200 Calculus of kidney: Secondary | ICD-10-CM | POA: Diagnosis not present

## 2016-07-21 DIAGNOSIS — N201 Calculus of ureter: Secondary | ICD-10-CM | POA: Diagnosis not present

## 2016-08-01 DIAGNOSIS — I10 Essential (primary) hypertension: Secondary | ICD-10-CM | POA: Diagnosis not present

## 2016-08-01 DIAGNOSIS — R0602 Shortness of breath: Secondary | ICD-10-CM | POA: Diagnosis not present

## 2016-08-01 DIAGNOSIS — F5101 Primary insomnia: Secondary | ICD-10-CM | POA: Diagnosis not present

## 2016-08-01 DIAGNOSIS — I482 Chronic atrial fibrillation: Secondary | ICD-10-CM | POA: Diagnosis not present

## 2016-08-02 DIAGNOSIS — R109 Unspecified abdominal pain: Secondary | ICD-10-CM | POA: Diagnosis not present

## 2016-08-02 DIAGNOSIS — N200 Calculus of kidney: Secondary | ICD-10-CM | POA: Diagnosis not present

## 2016-08-15 DIAGNOSIS — C678 Malignant neoplasm of overlapping sites of bladder: Secondary | ICD-10-CM | POA: Diagnosis not present

## 2016-08-15 DIAGNOSIS — I482 Chronic atrial fibrillation: Secondary | ICD-10-CM | POA: Diagnosis not present

## 2016-08-15 DIAGNOSIS — I10 Essential (primary) hypertension: Secondary | ICD-10-CM | POA: Diagnosis not present

## 2016-08-24 DIAGNOSIS — H25813 Combined forms of age-related cataract, bilateral: Secondary | ICD-10-CM | POA: Diagnosis not present

## 2016-08-24 DIAGNOSIS — E113393 Type 2 diabetes mellitus with moderate nonproliferative diabetic retinopathy without macular edema, bilateral: Secondary | ICD-10-CM | POA: Diagnosis not present

## 2016-09-12 DIAGNOSIS — E1142 Type 2 diabetes mellitus with diabetic polyneuropathy: Secondary | ICD-10-CM | POA: Diagnosis not present

## 2016-09-12 DIAGNOSIS — I10 Essential (primary) hypertension: Secondary | ICD-10-CM | POA: Diagnosis not present

## 2016-09-27 DIAGNOSIS — N201 Calculus of ureter: Secondary | ICD-10-CM | POA: Diagnosis not present

## 2016-09-27 DIAGNOSIS — N401 Enlarged prostate with lower urinary tract symptoms: Secondary | ICD-10-CM | POA: Diagnosis not present

## 2016-10-10 DIAGNOSIS — I480 Paroxysmal atrial fibrillation: Secondary | ICD-10-CM | POA: Diagnosis not present

## 2016-10-10 DIAGNOSIS — Z6832 Body mass index (BMI) 32.0-32.9, adult: Secondary | ICD-10-CM | POA: Diagnosis not present

## 2016-10-10 DIAGNOSIS — E088 Diabetes mellitus due to underlying condition with unspecified complications: Secondary | ICD-10-CM | POA: Diagnosis not present

## 2016-10-10 DIAGNOSIS — I10 Essential (primary) hypertension: Secondary | ICD-10-CM | POA: Diagnosis not present

## 2016-10-18 DIAGNOSIS — E034 Atrophy of thyroid (acquired): Secondary | ICD-10-CM | POA: Diagnosis not present

## 2016-10-18 DIAGNOSIS — E1121 Type 2 diabetes mellitus with diabetic nephropathy: Secondary | ICD-10-CM | POA: Diagnosis not present

## 2016-10-18 DIAGNOSIS — I10 Essential (primary) hypertension: Secondary | ICD-10-CM | POA: Diagnosis not present

## 2016-10-26 DIAGNOSIS — E1142 Type 2 diabetes mellitus with diabetic polyneuropathy: Secondary | ICD-10-CM | POA: Diagnosis not present

## 2016-10-26 DIAGNOSIS — I10 Essential (primary) hypertension: Secondary | ICD-10-CM | POA: Diagnosis not present

## 2016-10-26 DIAGNOSIS — I482 Chronic atrial fibrillation: Secondary | ICD-10-CM | POA: Diagnosis not present

## 2016-10-26 DIAGNOSIS — E782 Mixed hyperlipidemia: Secondary | ICD-10-CM | POA: Diagnosis not present

## 2016-10-26 DIAGNOSIS — E034 Atrophy of thyroid (acquired): Secondary | ICD-10-CM | POA: Diagnosis not present

## 2016-11-10 DIAGNOSIS — G250 Essential tremor: Secondary | ICD-10-CM | POA: Diagnosis not present

## 2016-11-10 DIAGNOSIS — I10 Essential (primary) hypertension: Secondary | ICD-10-CM | POA: Diagnosis not present

## 2016-11-10 DIAGNOSIS — I482 Chronic atrial fibrillation: Secondary | ICD-10-CM | POA: Diagnosis not present

## 2016-11-30 DIAGNOSIS — I482 Chronic atrial fibrillation: Secondary | ICD-10-CM | POA: Diagnosis not present

## 2016-11-30 DIAGNOSIS — I10 Essential (primary) hypertension: Secondary | ICD-10-CM | POA: Diagnosis not present

## 2016-11-30 DIAGNOSIS — I498 Other specified cardiac arrhythmias: Secondary | ICD-10-CM | POA: Diagnosis not present

## 2016-11-30 DIAGNOSIS — G252 Other specified forms of tremor: Secondary | ICD-10-CM | POA: Diagnosis not present

## 2016-12-05 DIAGNOSIS — H25813 Combined forms of age-related cataract, bilateral: Secondary | ICD-10-CM | POA: Diagnosis not present

## 2016-12-27 DIAGNOSIS — H6123 Impacted cerumen, bilateral: Secondary | ICD-10-CM | POA: Diagnosis not present

## 2016-12-27 DIAGNOSIS — I10 Essential (primary) hypertension: Secondary | ICD-10-CM | POA: Diagnosis not present

## 2016-12-27 DIAGNOSIS — I482 Chronic atrial fibrillation: Secondary | ICD-10-CM | POA: Diagnosis not present

## 2016-12-27 DIAGNOSIS — I498 Other specified cardiac arrhythmias: Secondary | ICD-10-CM | POA: Diagnosis not present

## 2016-12-27 DIAGNOSIS — G252 Other specified forms of tremor: Secondary | ICD-10-CM | POA: Diagnosis not present

## 2016-12-29 DIAGNOSIS — L03115 Cellulitis of right lower limb: Secondary | ICD-10-CM | POA: Diagnosis not present

## 2017-01-08 DIAGNOSIS — R001 Bradycardia, unspecified: Secondary | ICD-10-CM | POA: Diagnosis not present

## 2017-01-10 DIAGNOSIS — R001 Bradycardia, unspecified: Secondary | ICD-10-CM | POA: Diagnosis not present

## 2017-01-11 DIAGNOSIS — N200 Calculus of kidney: Secondary | ICD-10-CM | POA: Diagnosis not present

## 2017-01-11 DIAGNOSIS — C679 Malignant neoplasm of bladder, unspecified: Secondary | ICD-10-CM | POA: Diagnosis not present

## 2017-01-17 DIAGNOSIS — Z6832 Body mass index (BMI) 32.0-32.9, adult: Secondary | ICD-10-CM | POA: Diagnosis not present

## 2017-01-17 DIAGNOSIS — E088 Diabetes mellitus due to underlying condition with unspecified complications: Secondary | ICD-10-CM | POA: Diagnosis not present

## 2017-01-17 DIAGNOSIS — I495 Sick sinus syndrome: Secondary | ICD-10-CM | POA: Insufficient documentation

## 2017-01-17 DIAGNOSIS — R001 Bradycardia, unspecified: Secondary | ICD-10-CM | POA: Diagnosis not present

## 2017-01-17 DIAGNOSIS — I481 Persistent atrial fibrillation: Secondary | ICD-10-CM | POA: Diagnosis not present

## 2017-01-17 DIAGNOSIS — G4733 Obstructive sleep apnea (adult) (pediatric): Secondary | ICD-10-CM | POA: Insufficient documentation

## 2017-01-17 DIAGNOSIS — I10 Essential (primary) hypertension: Secondary | ICD-10-CM | POA: Diagnosis not present

## 2017-01-17 DIAGNOSIS — E039 Hypothyroidism, unspecified: Secondary | ICD-10-CM | POA: Diagnosis not present

## 2017-01-17 DIAGNOSIS — E1159 Type 2 diabetes mellitus with other circulatory complications: Secondary | ICD-10-CM | POA: Insufficient documentation

## 2017-01-17 HISTORY — DX: Obstructive sleep apnea (adult) (pediatric): G47.33

## 2017-01-17 HISTORY — DX: Type 2 diabetes mellitus with other circulatory complications: E11.59

## 2017-01-25 DIAGNOSIS — I495 Sick sinus syndrome: Secondary | ICD-10-CM | POA: Diagnosis not present

## 2017-01-25 DIAGNOSIS — Z950 Presence of cardiac pacemaker: Secondary | ICD-10-CM | POA: Diagnosis not present

## 2017-01-29 DIAGNOSIS — I495 Sick sinus syndrome: Secondary | ICD-10-CM | POA: Diagnosis not present

## 2017-01-29 DIAGNOSIS — Z452 Encounter for adjustment and management of vascular access device: Secondary | ICD-10-CM | POA: Diagnosis not present

## 2017-01-29 DIAGNOSIS — Z7984 Long term (current) use of oral hypoglycemic drugs: Secondary | ICD-10-CM | POA: Diagnosis not present

## 2017-01-29 DIAGNOSIS — E119 Type 2 diabetes mellitus without complications: Secondary | ICD-10-CM | POA: Diagnosis not present

## 2017-01-29 DIAGNOSIS — Z79899 Other long term (current) drug therapy: Secondary | ICD-10-CM | POA: Diagnosis not present

## 2017-01-29 DIAGNOSIS — N289 Disorder of kidney and ureter, unspecified: Secondary | ICD-10-CM | POA: Diagnosis not present

## 2017-01-29 DIAGNOSIS — Z950 Presence of cardiac pacemaker: Secondary | ICD-10-CM | POA: Insufficient documentation

## 2017-01-29 DIAGNOSIS — G4733 Obstructive sleep apnea (adult) (pediatric): Secondary | ICD-10-CM | POA: Diagnosis not present

## 2017-01-29 DIAGNOSIS — I472 Ventricular tachycardia: Secondary | ICD-10-CM | POA: Diagnosis not present

## 2017-01-29 DIAGNOSIS — I10 Essential (primary) hypertension: Secondary | ICD-10-CM | POA: Diagnosis not present

## 2017-01-29 DIAGNOSIS — I481 Persistent atrial fibrillation: Secondary | ICD-10-CM | POA: Diagnosis not present

## 2017-01-29 DIAGNOSIS — E039 Hypothyroidism, unspecified: Secondary | ICD-10-CM | POA: Diagnosis not present

## 2017-01-29 HISTORY — PX: PACEMAKER PLACEMENT: SHX43

## 2017-01-29 HISTORY — DX: Presence of cardiac pacemaker: Z95.0

## 2017-01-30 DIAGNOSIS — E119 Type 2 diabetes mellitus without complications: Secondary | ICD-10-CM | POA: Diagnosis not present

## 2017-01-30 DIAGNOSIS — I10 Essential (primary) hypertension: Secondary | ICD-10-CM | POA: Diagnosis not present

## 2017-01-30 DIAGNOSIS — I481 Persistent atrial fibrillation: Secondary | ICD-10-CM | POA: Diagnosis not present

## 2017-01-30 DIAGNOSIS — G4733 Obstructive sleep apnea (adult) (pediatric): Secondary | ICD-10-CM | POA: Diagnosis not present

## 2017-01-30 DIAGNOSIS — I495 Sick sinus syndrome: Secondary | ICD-10-CM | POA: Diagnosis not present

## 2017-01-30 DIAGNOSIS — Z950 Presence of cardiac pacemaker: Secondary | ICD-10-CM | POA: Diagnosis not present

## 2017-02-07 DIAGNOSIS — I10 Essential (primary) hypertension: Secondary | ICD-10-CM | POA: Diagnosis not present

## 2017-02-07 DIAGNOSIS — H6123 Impacted cerumen, bilateral: Secondary | ICD-10-CM | POA: Diagnosis not present

## 2017-02-07 DIAGNOSIS — I495 Sick sinus syndrome: Secondary | ICD-10-CM | POA: Diagnosis not present

## 2017-02-07 DIAGNOSIS — E1142 Type 2 diabetes mellitus with diabetic polyneuropathy: Secondary | ICD-10-CM | POA: Diagnosis not present

## 2017-02-07 DIAGNOSIS — I482 Chronic atrial fibrillation: Secondary | ICD-10-CM | POA: Diagnosis not present

## 2017-02-07 DIAGNOSIS — E782 Mixed hyperlipidemia: Secondary | ICD-10-CM | POA: Diagnosis not present

## 2017-02-07 DIAGNOSIS — R6884 Jaw pain: Secondary | ICD-10-CM | POA: Diagnosis not present

## 2017-02-07 DIAGNOSIS — E034 Atrophy of thyroid (acquired): Secondary | ICD-10-CM | POA: Diagnosis not present

## 2017-02-12 DIAGNOSIS — Z45018 Encounter for adjustment and management of other part of cardiac pacemaker: Secondary | ICD-10-CM

## 2017-02-12 DIAGNOSIS — I495 Sick sinus syndrome: Secondary | ICD-10-CM | POA: Diagnosis not present

## 2017-02-12 HISTORY — DX: Encounter for adjustment and management of other part of cardiac pacemaker: Z45.018

## 2017-02-23 DIAGNOSIS — H25811 Combined forms of age-related cataract, right eye: Secondary | ICD-10-CM | POA: Diagnosis not present

## 2017-03-07 DIAGNOSIS — R001 Bradycardia, unspecified: Secondary | ICD-10-CM | POA: Insufficient documentation

## 2017-03-07 DIAGNOSIS — I10 Essential (primary) hypertension: Secondary | ICD-10-CM | POA: Diagnosis not present

## 2017-03-07 DIAGNOSIS — Z7901 Long term (current) use of anticoagulants: Secondary | ICD-10-CM | POA: Diagnosis not present

## 2017-03-07 DIAGNOSIS — I481 Persistent atrial fibrillation: Secondary | ICD-10-CM | POA: Diagnosis not present

## 2017-03-07 DIAGNOSIS — Z950 Presence of cardiac pacemaker: Secondary | ICD-10-CM | POA: Diagnosis not present

## 2017-03-07 DIAGNOSIS — E039 Hypothyroidism, unspecified: Secondary | ICD-10-CM | POA: Insufficient documentation

## 2017-03-07 DIAGNOSIS — Z7984 Long term (current) use of oral hypoglycemic drugs: Secondary | ICD-10-CM | POA: Diagnosis not present

## 2017-03-07 DIAGNOSIS — I495 Sick sinus syndrome: Secondary | ICD-10-CM | POA: Diagnosis not present

## 2017-03-07 DIAGNOSIS — Z01812 Encounter for preprocedural laboratory examination: Secondary | ICD-10-CM | POA: Diagnosis not present

## 2017-03-07 DIAGNOSIS — E119 Type 2 diabetes mellitus without complications: Secondary | ICD-10-CM | POA: Diagnosis not present

## 2017-03-07 HISTORY — DX: Bradycardia, unspecified: R00.1

## 2017-03-08 DIAGNOSIS — H2511 Age-related nuclear cataract, right eye: Secondary | ICD-10-CM | POA: Diagnosis not present

## 2017-03-08 DIAGNOSIS — H25811 Combined forms of age-related cataract, right eye: Secondary | ICD-10-CM | POA: Diagnosis not present

## 2017-03-20 DIAGNOSIS — I481 Persistent atrial fibrillation: Secondary | ICD-10-CM | POA: Diagnosis not present

## 2017-03-27 DIAGNOSIS — L578 Other skin changes due to chronic exposure to nonionizing radiation: Secondary | ICD-10-CM | POA: Diagnosis not present

## 2017-03-27 DIAGNOSIS — L570 Actinic keratosis: Secondary | ICD-10-CM | POA: Diagnosis not present

## 2017-03-29 DIAGNOSIS — Z23 Encounter for immunization: Secondary | ICD-10-CM | POA: Diagnosis not present

## 2017-04-23 DIAGNOSIS — E039 Hypothyroidism, unspecified: Secondary | ICD-10-CM | POA: Diagnosis not present

## 2017-04-23 DIAGNOSIS — I495 Sick sinus syndrome: Secondary | ICD-10-CM | POA: Diagnosis not present

## 2017-04-23 DIAGNOSIS — Z950 Presence of cardiac pacemaker: Secondary | ICD-10-CM | POA: Diagnosis not present

## 2017-04-23 DIAGNOSIS — E782 Mixed hyperlipidemia: Secondary | ICD-10-CM | POA: Diagnosis not present

## 2017-04-23 DIAGNOSIS — E1142 Type 2 diabetes mellitus with diabetic polyneuropathy: Secondary | ICD-10-CM | POA: Diagnosis not present

## 2017-04-23 DIAGNOSIS — I4891 Unspecified atrial fibrillation: Secondary | ICD-10-CM | POA: Diagnosis not present

## 2017-04-23 DIAGNOSIS — G4733 Obstructive sleep apnea (adult) (pediatric): Secondary | ICD-10-CM | POA: Diagnosis not present

## 2017-04-23 DIAGNOSIS — R001 Bradycardia, unspecified: Secondary | ICD-10-CM | POA: Diagnosis not present

## 2017-04-25 DIAGNOSIS — I10 Essential (primary) hypertension: Secondary | ICD-10-CM | POA: Diagnosis not present

## 2017-04-25 DIAGNOSIS — E6609 Other obesity due to excess calories: Secondary | ICD-10-CM | POA: Diagnosis not present

## 2017-04-25 DIAGNOSIS — E1121 Type 2 diabetes mellitus with diabetic nephropathy: Secondary | ICD-10-CM | POA: Diagnosis not present

## 2017-04-25 DIAGNOSIS — Z23 Encounter for immunization: Secondary | ICD-10-CM | POA: Diagnosis not present

## 2017-04-25 DIAGNOSIS — E782 Mixed hyperlipidemia: Secondary | ICD-10-CM | POA: Diagnosis not present

## 2017-04-25 DIAGNOSIS — M26622 Arthralgia of left temporomandibular joint: Secondary | ICD-10-CM | POA: Diagnosis not present

## 2017-04-25 DIAGNOSIS — I482 Chronic atrial fibrillation: Secondary | ICD-10-CM | POA: Diagnosis not present

## 2017-04-25 DIAGNOSIS — Z6833 Body mass index (BMI) 33.0-33.9, adult: Secondary | ICD-10-CM | POA: Diagnosis not present

## 2017-04-25 DIAGNOSIS — Z0001 Encounter for general adult medical examination with abnormal findings: Secondary | ICD-10-CM | POA: Diagnosis not present

## 2017-05-24 DIAGNOSIS — I495 Sick sinus syndrome: Secondary | ICD-10-CM | POA: Diagnosis not present

## 2017-05-24 DIAGNOSIS — R001 Bradycardia, unspecified: Secondary | ICD-10-CM | POA: Diagnosis not present

## 2017-05-24 DIAGNOSIS — Z950 Presence of cardiac pacemaker: Secondary | ICD-10-CM | POA: Diagnosis not present

## 2017-06-01 DIAGNOSIS — I4811 Longstanding persistent atrial fibrillation: Secondary | ICD-10-CM | POA: Insufficient documentation

## 2017-06-01 DIAGNOSIS — G4733 Obstructive sleep apnea (adult) (pediatric): Secondary | ICD-10-CM | POA: Diagnosis not present

## 2017-06-01 DIAGNOSIS — I495 Sick sinus syndrome: Secondary | ICD-10-CM | POA: Diagnosis not present

## 2017-06-01 DIAGNOSIS — I481 Persistent atrial fibrillation: Secondary | ICD-10-CM | POA: Diagnosis not present

## 2017-06-01 DIAGNOSIS — R001 Bradycardia, unspecified: Secondary | ICD-10-CM | POA: Diagnosis not present

## 2017-06-01 DIAGNOSIS — I4819 Other persistent atrial fibrillation: Secondary | ICD-10-CM

## 2017-06-01 DIAGNOSIS — E039 Hypothyroidism, unspecified: Secondary | ICD-10-CM | POA: Diagnosis not present

## 2017-06-01 DIAGNOSIS — I4891 Unspecified atrial fibrillation: Secondary | ICD-10-CM | POA: Diagnosis not present

## 2017-06-01 DIAGNOSIS — Z950 Presence of cardiac pacemaker: Secondary | ICD-10-CM | POA: Diagnosis not present

## 2017-06-01 HISTORY — DX: Other persistent atrial fibrillation: I48.19

## 2017-06-04 DIAGNOSIS — I495 Sick sinus syndrome: Secondary | ICD-10-CM | POA: Diagnosis not present

## 2017-06-04 DIAGNOSIS — E039 Hypothyroidism, unspecified: Secondary | ICD-10-CM | POA: Diagnosis not present

## 2017-06-04 DIAGNOSIS — R001 Bradycardia, unspecified: Secondary | ICD-10-CM | POA: Diagnosis not present

## 2017-06-04 DIAGNOSIS — I4891 Unspecified atrial fibrillation: Secondary | ICD-10-CM | POA: Diagnosis not present

## 2017-06-04 DIAGNOSIS — I481 Persistent atrial fibrillation: Secondary | ICD-10-CM | POA: Diagnosis not present

## 2017-06-04 DIAGNOSIS — G4733 Obstructive sleep apnea (adult) (pediatric): Secondary | ICD-10-CM | POA: Diagnosis not present

## 2017-06-04 DIAGNOSIS — Z950 Presence of cardiac pacemaker: Secondary | ICD-10-CM | POA: Diagnosis not present

## 2017-06-18 DIAGNOSIS — R6884 Jaw pain: Secondary | ICD-10-CM | POA: Insufficient documentation

## 2017-06-18 DIAGNOSIS — I4891 Unspecified atrial fibrillation: Secondary | ICD-10-CM | POA: Diagnosis not present

## 2017-06-18 DIAGNOSIS — Z01818 Encounter for other preprocedural examination: Secondary | ICD-10-CM | POA: Diagnosis not present

## 2017-06-19 HISTORY — PX: ABLATION: SHX5711

## 2017-06-20 DIAGNOSIS — Z950 Presence of cardiac pacemaker: Secondary | ICD-10-CM | POA: Diagnosis not present

## 2017-06-20 DIAGNOSIS — E039 Hypothyroidism, unspecified: Secondary | ICD-10-CM | POA: Diagnosis not present

## 2017-06-20 DIAGNOSIS — I481 Persistent atrial fibrillation: Secondary | ICD-10-CM | POA: Diagnosis not present

## 2017-06-20 DIAGNOSIS — Z7901 Long term (current) use of anticoagulants: Secondary | ICD-10-CM | POA: Diagnosis not present

## 2017-06-20 DIAGNOSIS — E119 Type 2 diabetes mellitus without complications: Secondary | ICD-10-CM | POA: Diagnosis not present

## 2017-06-20 DIAGNOSIS — I495 Sick sinus syndrome: Secondary | ICD-10-CM | POA: Diagnosis not present

## 2017-06-20 DIAGNOSIS — Z794 Long term (current) use of insulin: Secondary | ICD-10-CM | POA: Diagnosis not present

## 2017-06-20 DIAGNOSIS — G4733 Obstructive sleep apnea (adult) (pediatric): Secondary | ICD-10-CM | POA: Diagnosis not present

## 2017-06-21 DIAGNOSIS — I495 Sick sinus syndrome: Secondary | ICD-10-CM | POA: Diagnosis not present

## 2017-06-21 DIAGNOSIS — I481 Persistent atrial fibrillation: Secondary | ICD-10-CM | POA: Diagnosis not present

## 2017-06-21 DIAGNOSIS — G4733 Obstructive sleep apnea (adult) (pediatric): Secondary | ICD-10-CM | POA: Diagnosis not present

## 2017-06-21 DIAGNOSIS — Z7901 Long term (current) use of anticoagulants: Secondary | ICD-10-CM | POA: Diagnosis not present

## 2017-06-21 DIAGNOSIS — E119 Type 2 diabetes mellitus without complications: Secondary | ICD-10-CM | POA: Diagnosis not present

## 2017-06-21 DIAGNOSIS — E039 Hypothyroidism, unspecified: Secondary | ICD-10-CM | POA: Diagnosis not present

## 2017-07-16 DIAGNOSIS — C679 Malignant neoplasm of bladder, unspecified: Secondary | ICD-10-CM | POA: Diagnosis not present

## 2017-07-16 DIAGNOSIS — N401 Enlarged prostate with lower urinary tract symptoms: Secondary | ICD-10-CM | POA: Diagnosis not present

## 2017-08-06 DIAGNOSIS — I10 Essential (primary) hypertension: Secondary | ICD-10-CM | POA: Diagnosis not present

## 2017-08-06 DIAGNOSIS — E782 Mixed hyperlipidemia: Secondary | ICD-10-CM | POA: Diagnosis not present

## 2017-08-06 DIAGNOSIS — E034 Atrophy of thyroid (acquired): Secondary | ICD-10-CM | POA: Diagnosis not present

## 2017-08-06 DIAGNOSIS — E1142 Type 2 diabetes mellitus with diabetic polyneuropathy: Secondary | ICD-10-CM | POA: Diagnosis not present

## 2017-08-07 DIAGNOSIS — G5722 Lesion of femoral nerve, left lower limb: Secondary | ICD-10-CM | POA: Diagnosis not present

## 2017-08-07 DIAGNOSIS — I10 Essential (primary) hypertension: Secondary | ICD-10-CM | POA: Diagnosis not present

## 2017-08-07 DIAGNOSIS — I481 Persistent atrial fibrillation: Secondary | ICD-10-CM | POA: Diagnosis not present

## 2017-08-07 DIAGNOSIS — E039 Hypothyroidism, unspecified: Secondary | ICD-10-CM | POA: Diagnosis not present

## 2017-08-07 DIAGNOSIS — G4733 Obstructive sleep apnea (adult) (pediatric): Secondary | ICD-10-CM | POA: Diagnosis not present

## 2017-08-07 DIAGNOSIS — Z45018 Encounter for adjustment and management of other part of cardiac pacemaker: Secondary | ICD-10-CM | POA: Diagnosis not present

## 2017-08-07 DIAGNOSIS — G250 Essential tremor: Secondary | ICD-10-CM | POA: Diagnosis not present

## 2017-08-07 DIAGNOSIS — I4589 Other specified conduction disorders: Secondary | ICD-10-CM | POA: Diagnosis not present

## 2017-08-07 DIAGNOSIS — R001 Bradycardia, unspecified: Secondary | ICD-10-CM | POA: Diagnosis not present

## 2017-08-07 DIAGNOSIS — I495 Sick sinus syndrome: Secondary | ICD-10-CM | POA: Diagnosis not present

## 2017-08-07 DIAGNOSIS — Z950 Presence of cardiac pacemaker: Secondary | ICD-10-CM | POA: Diagnosis not present

## 2017-08-07 DIAGNOSIS — I447 Left bundle-branch block, unspecified: Secondary | ICD-10-CM | POA: Diagnosis not present

## 2017-08-08 DIAGNOSIS — E782 Mixed hyperlipidemia: Secondary | ICD-10-CM | POA: Diagnosis not present

## 2017-08-08 DIAGNOSIS — I482 Chronic atrial fibrillation: Secondary | ICD-10-CM | POA: Diagnosis not present

## 2017-08-08 DIAGNOSIS — I10 Essential (primary) hypertension: Secondary | ICD-10-CM | POA: Diagnosis not present

## 2017-08-08 DIAGNOSIS — E034 Atrophy of thyroid (acquired): Secondary | ICD-10-CM | POA: Diagnosis not present

## 2017-08-08 DIAGNOSIS — E1142 Type 2 diabetes mellitus with diabetic polyneuropathy: Secondary | ICD-10-CM | POA: Diagnosis not present

## 2017-08-13 DIAGNOSIS — M5136 Other intervertebral disc degeneration, lumbar region: Secondary | ICD-10-CM | POA: Diagnosis not present

## 2017-08-13 DIAGNOSIS — M5442 Lumbago with sciatica, left side: Secondary | ICD-10-CM | POA: Diagnosis not present

## 2017-08-13 DIAGNOSIS — M9904 Segmental and somatic dysfunction of sacral region: Secondary | ICD-10-CM | POA: Diagnosis not present

## 2017-08-13 DIAGNOSIS — M9903 Segmental and somatic dysfunction of lumbar region: Secondary | ICD-10-CM | POA: Diagnosis not present

## 2017-08-13 DIAGNOSIS — M546 Pain in thoracic spine: Secondary | ICD-10-CM | POA: Diagnosis not present

## 2017-08-13 DIAGNOSIS — M5137 Other intervertebral disc degeneration, lumbosacral region: Secondary | ICD-10-CM | POA: Diagnosis not present

## 2017-08-13 DIAGNOSIS — M9902 Segmental and somatic dysfunction of thoracic region: Secondary | ICD-10-CM | POA: Diagnosis not present

## 2017-08-14 DIAGNOSIS — M9902 Segmental and somatic dysfunction of thoracic region: Secondary | ICD-10-CM | POA: Diagnosis not present

## 2017-08-14 DIAGNOSIS — M9903 Segmental and somatic dysfunction of lumbar region: Secondary | ICD-10-CM | POA: Diagnosis not present

## 2017-08-14 DIAGNOSIS — M5137 Other intervertebral disc degeneration, lumbosacral region: Secondary | ICD-10-CM | POA: Diagnosis not present

## 2017-08-14 DIAGNOSIS — M5442 Lumbago with sciatica, left side: Secondary | ICD-10-CM | POA: Diagnosis not present

## 2017-08-14 DIAGNOSIS — M546 Pain in thoracic spine: Secondary | ICD-10-CM | POA: Diagnosis not present

## 2017-08-14 DIAGNOSIS — M9904 Segmental and somatic dysfunction of sacral region: Secondary | ICD-10-CM | POA: Diagnosis not present

## 2017-08-14 DIAGNOSIS — M5136 Other intervertebral disc degeneration, lumbar region: Secondary | ICD-10-CM | POA: Diagnosis not present

## 2017-08-16 DIAGNOSIS — M9904 Segmental and somatic dysfunction of sacral region: Secondary | ICD-10-CM | POA: Diagnosis not present

## 2017-08-16 DIAGNOSIS — M9903 Segmental and somatic dysfunction of lumbar region: Secondary | ICD-10-CM | POA: Diagnosis not present

## 2017-08-16 DIAGNOSIS — M5136 Other intervertebral disc degeneration, lumbar region: Secondary | ICD-10-CM | POA: Diagnosis not present

## 2017-08-16 DIAGNOSIS — M9902 Segmental and somatic dysfunction of thoracic region: Secondary | ICD-10-CM | POA: Diagnosis not present

## 2017-08-16 DIAGNOSIS — M546 Pain in thoracic spine: Secondary | ICD-10-CM | POA: Diagnosis not present

## 2017-08-16 DIAGNOSIS — M5442 Lumbago with sciatica, left side: Secondary | ICD-10-CM | POA: Diagnosis not present

## 2017-08-16 DIAGNOSIS — M5137 Other intervertebral disc degeneration, lumbosacral region: Secondary | ICD-10-CM | POA: Diagnosis not present

## 2017-08-20 DIAGNOSIS — M5442 Lumbago with sciatica, left side: Secondary | ICD-10-CM | POA: Diagnosis not present

## 2017-08-20 DIAGNOSIS — M5136 Other intervertebral disc degeneration, lumbar region: Secondary | ICD-10-CM | POA: Diagnosis not present

## 2017-08-20 DIAGNOSIS — M9904 Segmental and somatic dysfunction of sacral region: Secondary | ICD-10-CM | POA: Diagnosis not present

## 2017-08-20 DIAGNOSIS — M546 Pain in thoracic spine: Secondary | ICD-10-CM | POA: Diagnosis not present

## 2017-08-20 DIAGNOSIS — M9902 Segmental and somatic dysfunction of thoracic region: Secondary | ICD-10-CM | POA: Diagnosis not present

## 2017-08-20 DIAGNOSIS — M9903 Segmental and somatic dysfunction of lumbar region: Secondary | ICD-10-CM | POA: Diagnosis not present

## 2017-08-20 DIAGNOSIS — M5137 Other intervertebral disc degeneration, lumbosacral region: Secondary | ICD-10-CM | POA: Diagnosis not present

## 2017-08-22 DIAGNOSIS — M546 Pain in thoracic spine: Secondary | ICD-10-CM | POA: Diagnosis not present

## 2017-08-22 DIAGNOSIS — M9904 Segmental and somatic dysfunction of sacral region: Secondary | ICD-10-CM | POA: Diagnosis not present

## 2017-08-22 DIAGNOSIS — M5442 Lumbago with sciatica, left side: Secondary | ICD-10-CM | POA: Diagnosis not present

## 2017-08-22 DIAGNOSIS — M5137 Other intervertebral disc degeneration, lumbosacral region: Secondary | ICD-10-CM | POA: Diagnosis not present

## 2017-08-22 DIAGNOSIS — M5136 Other intervertebral disc degeneration, lumbar region: Secondary | ICD-10-CM | POA: Diagnosis not present

## 2017-08-22 DIAGNOSIS — M9903 Segmental and somatic dysfunction of lumbar region: Secondary | ICD-10-CM | POA: Diagnosis not present

## 2017-08-22 DIAGNOSIS — M9902 Segmental and somatic dysfunction of thoracic region: Secondary | ICD-10-CM | POA: Diagnosis not present

## 2017-08-24 DIAGNOSIS — M9903 Segmental and somatic dysfunction of lumbar region: Secondary | ICD-10-CM | POA: Diagnosis not present

## 2017-08-24 DIAGNOSIS — M5137 Other intervertebral disc degeneration, lumbosacral region: Secondary | ICD-10-CM | POA: Diagnosis not present

## 2017-08-24 DIAGNOSIS — M9904 Segmental and somatic dysfunction of sacral region: Secondary | ICD-10-CM | POA: Diagnosis not present

## 2017-08-24 DIAGNOSIS — M5136 Other intervertebral disc degeneration, lumbar region: Secondary | ICD-10-CM | POA: Diagnosis not present

## 2017-08-24 DIAGNOSIS — M5442 Lumbago with sciatica, left side: Secondary | ICD-10-CM | POA: Diagnosis not present

## 2017-08-24 DIAGNOSIS — M9902 Segmental and somatic dysfunction of thoracic region: Secondary | ICD-10-CM | POA: Diagnosis not present

## 2017-08-24 DIAGNOSIS — M546 Pain in thoracic spine: Secondary | ICD-10-CM | POA: Diagnosis not present

## 2017-08-28 DIAGNOSIS — M9902 Segmental and somatic dysfunction of thoracic region: Secondary | ICD-10-CM | POA: Diagnosis not present

## 2017-08-28 DIAGNOSIS — M546 Pain in thoracic spine: Secondary | ICD-10-CM | POA: Diagnosis not present

## 2017-08-28 DIAGNOSIS — M5442 Lumbago with sciatica, left side: Secondary | ICD-10-CM | POA: Diagnosis not present

## 2017-08-28 DIAGNOSIS — M9903 Segmental and somatic dysfunction of lumbar region: Secondary | ICD-10-CM | POA: Diagnosis not present

## 2017-08-28 DIAGNOSIS — M5136 Other intervertebral disc degeneration, lumbar region: Secondary | ICD-10-CM | POA: Diagnosis not present

## 2017-08-28 DIAGNOSIS — M5137 Other intervertebral disc degeneration, lumbosacral region: Secondary | ICD-10-CM | POA: Diagnosis not present

## 2017-08-28 DIAGNOSIS — M9904 Segmental and somatic dysfunction of sacral region: Secondary | ICD-10-CM | POA: Diagnosis not present

## 2017-09-27 DIAGNOSIS — R0602 Shortness of breath: Secondary | ICD-10-CM | POA: Diagnosis not present

## 2017-09-27 DIAGNOSIS — G4733 Obstructive sleep apnea (adult) (pediatric): Secondary | ICD-10-CM | POA: Diagnosis not present

## 2017-09-27 DIAGNOSIS — R5383 Other fatigue: Secondary | ICD-10-CM | POA: Diagnosis not present

## 2017-09-27 DIAGNOSIS — I482 Chronic atrial fibrillation: Secondary | ICD-10-CM | POA: Diagnosis not present

## 2017-10-02 DIAGNOSIS — H2512 Age-related nuclear cataract, left eye: Secondary | ICD-10-CM | POA: Diagnosis not present

## 2017-10-02 DIAGNOSIS — H26491 Other secondary cataract, right eye: Secondary | ICD-10-CM | POA: Diagnosis not present

## 2017-10-02 DIAGNOSIS — E113293 Type 2 diabetes mellitus with mild nonproliferative diabetic retinopathy without macular edema, bilateral: Secondary | ICD-10-CM | POA: Diagnosis not present

## 2017-11-05 DIAGNOSIS — E782 Mixed hyperlipidemia: Secondary | ICD-10-CM | POA: Diagnosis not present

## 2017-11-05 DIAGNOSIS — E1169 Type 2 diabetes mellitus with other specified complication: Secondary | ICD-10-CM | POA: Diagnosis not present

## 2017-11-08 DIAGNOSIS — Z6834 Body mass index (BMI) 34.0-34.9, adult: Secondary | ICD-10-CM | POA: Diagnosis not present

## 2017-11-08 DIAGNOSIS — I10 Essential (primary) hypertension: Secondary | ICD-10-CM | POA: Diagnosis not present

## 2017-11-08 DIAGNOSIS — M79661 Pain in right lower leg: Secondary | ICD-10-CM | POA: Diagnosis not present

## 2017-11-08 DIAGNOSIS — I482 Chronic atrial fibrillation: Secondary | ICD-10-CM | POA: Diagnosis not present

## 2017-11-08 DIAGNOSIS — E782 Mixed hyperlipidemia: Secondary | ICD-10-CM | POA: Diagnosis not present

## 2017-11-08 DIAGNOSIS — E668 Other obesity: Secondary | ICD-10-CM | POA: Diagnosis not present

## 2017-11-08 DIAGNOSIS — E1142 Type 2 diabetes mellitus with diabetic polyneuropathy: Secondary | ICD-10-CM | POA: Diagnosis not present

## 2017-11-08 DIAGNOSIS — G4733 Obstructive sleep apnea (adult) (pediatric): Secondary | ICD-10-CM | POA: Diagnosis not present

## 2017-11-08 DIAGNOSIS — R040 Epistaxis: Secondary | ICD-10-CM | POA: Diagnosis not present

## 2017-11-08 DIAGNOSIS — E034 Atrophy of thyroid (acquired): Secondary | ICD-10-CM | POA: Diagnosis not present

## 2017-11-08 DIAGNOSIS — K117 Disturbances of salivary secretion: Secondary | ICD-10-CM | POA: Diagnosis not present

## 2017-11-08 DIAGNOSIS — F5101 Primary insomnia: Secondary | ICD-10-CM | POA: Diagnosis not present

## 2017-11-09 DIAGNOSIS — G4733 Obstructive sleep apnea (adult) (pediatric): Secondary | ICD-10-CM | POA: Diagnosis not present

## 2017-11-09 DIAGNOSIS — J342 Deviated nasal septum: Secondary | ICD-10-CM | POA: Diagnosis not present

## 2017-11-09 DIAGNOSIS — Z9989 Dependence on other enabling machines and devices: Secondary | ICD-10-CM | POA: Diagnosis not present

## 2017-11-09 DIAGNOSIS — H61303 Acquired stenosis of external ear canal, unspecified, bilateral: Secondary | ICD-10-CM | POA: Diagnosis not present

## 2017-11-09 DIAGNOSIS — R0981 Nasal congestion: Secondary | ICD-10-CM | POA: Diagnosis not present

## 2017-11-09 DIAGNOSIS — J343 Hypertrophy of nasal turbinates: Secondary | ICD-10-CM | POA: Diagnosis not present

## 2017-11-09 DIAGNOSIS — Z7902 Long term (current) use of antithrombotics/antiplatelets: Secondary | ICD-10-CM | POA: Diagnosis not present

## 2017-11-09 DIAGNOSIS — R040 Epistaxis: Secondary | ICD-10-CM | POA: Diagnosis not present

## 2017-11-27 DIAGNOSIS — J342 Deviated nasal septum: Secondary | ICD-10-CM | POA: Diagnosis not present

## 2017-11-27 DIAGNOSIS — J343 Hypertrophy of nasal turbinates: Secondary | ICD-10-CM | POA: Diagnosis not present

## 2017-11-27 DIAGNOSIS — Z7902 Long term (current) use of antithrombotics/antiplatelets: Secondary | ICD-10-CM | POA: Diagnosis not present

## 2017-11-27 DIAGNOSIS — Z9989 Dependence on other enabling machines and devices: Secondary | ICD-10-CM | POA: Diagnosis not present

## 2017-11-27 DIAGNOSIS — Z87898 Personal history of other specified conditions: Secondary | ICD-10-CM | POA: Diagnosis not present

## 2017-11-27 DIAGNOSIS — J3489 Other specified disorders of nose and nasal sinuses: Secondary | ICD-10-CM | POA: Diagnosis not present

## 2017-11-27 DIAGNOSIS — R0981 Nasal congestion: Secondary | ICD-10-CM | POA: Diagnosis not present

## 2017-11-27 DIAGNOSIS — G4733 Obstructive sleep apnea (adult) (pediatric): Secondary | ICD-10-CM | POA: Diagnosis not present

## 2017-12-19 DIAGNOSIS — I10 Essential (primary) hypertension: Secondary | ICD-10-CM | POA: Diagnosis not present

## 2017-12-19 DIAGNOSIS — M7981 Nontraumatic hematoma of soft tissue: Secondary | ICD-10-CM | POA: Diagnosis not present

## 2017-12-19 DIAGNOSIS — Z01810 Encounter for preprocedural cardiovascular examination: Secondary | ICD-10-CM | POA: Diagnosis not present

## 2017-12-19 DIAGNOSIS — I482 Chronic atrial fibrillation: Secondary | ICD-10-CM | POA: Diagnosis not present

## 2017-12-19 DIAGNOSIS — R079 Chest pain, unspecified: Secondary | ICD-10-CM | POA: Diagnosis not present

## 2017-12-19 DIAGNOSIS — E1142 Type 2 diabetes mellitus with diabetic polyneuropathy: Secondary | ICD-10-CM | POA: Diagnosis not present

## 2017-12-19 DIAGNOSIS — R233 Spontaneous ecchymoses: Secondary | ICD-10-CM | POA: Diagnosis not present

## 2017-12-19 DIAGNOSIS — R0602 Shortness of breath: Secondary | ICD-10-CM | POA: Diagnosis not present

## 2017-12-19 DIAGNOSIS — R5383 Other fatigue: Secondary | ICD-10-CM | POA: Diagnosis not present

## 2017-12-21 DIAGNOSIS — E291 Testicular hypofunction: Secondary | ICD-10-CM | POA: Diagnosis not present

## 2017-12-25 DIAGNOSIS — E291 Testicular hypofunction: Secondary | ICD-10-CM | POA: Diagnosis not present

## 2017-12-31 DIAGNOSIS — R0981 Nasal congestion: Secondary | ICD-10-CM | POA: Diagnosis not present

## 2017-12-31 DIAGNOSIS — R040 Epistaxis: Secondary | ICD-10-CM | POA: Diagnosis not present

## 2017-12-31 DIAGNOSIS — J343 Hypertrophy of nasal turbinates: Secondary | ICD-10-CM | POA: Diagnosis not present

## 2017-12-31 DIAGNOSIS — J342 Deviated nasal septum: Secondary | ICD-10-CM | POA: Diagnosis not present

## 2018-01-01 DIAGNOSIS — E113293 Type 2 diabetes mellitus with mild nonproliferative diabetic retinopathy without macular edema, bilateral: Secondary | ICD-10-CM | POA: Diagnosis not present

## 2018-01-01 DIAGNOSIS — H2512 Age-related nuclear cataract, left eye: Secondary | ICD-10-CM | POA: Diagnosis not present

## 2018-01-14 DIAGNOSIS — C679 Malignant neoplasm of bladder, unspecified: Secondary | ICD-10-CM | POA: Diagnosis not present

## 2018-01-14 DIAGNOSIS — N200 Calculus of kidney: Secondary | ICD-10-CM | POA: Diagnosis not present

## 2018-01-14 DIAGNOSIS — Z125 Encounter for screening for malignant neoplasm of prostate: Secondary | ICD-10-CM | POA: Diagnosis not present

## 2018-01-30 DIAGNOSIS — M9903 Segmental and somatic dysfunction of lumbar region: Secondary | ICD-10-CM | POA: Diagnosis not present

## 2018-01-30 DIAGNOSIS — M545 Low back pain: Secondary | ICD-10-CM | POA: Diagnosis not present

## 2018-01-30 DIAGNOSIS — M9905 Segmental and somatic dysfunction of pelvic region: Secondary | ICD-10-CM | POA: Diagnosis not present

## 2018-01-30 DIAGNOSIS — M9902 Segmental and somatic dysfunction of thoracic region: Secondary | ICD-10-CM | POA: Diagnosis not present

## 2018-02-01 DIAGNOSIS — M9905 Segmental and somatic dysfunction of pelvic region: Secondary | ICD-10-CM | POA: Diagnosis not present

## 2018-02-01 DIAGNOSIS — M9902 Segmental and somatic dysfunction of thoracic region: Secondary | ICD-10-CM | POA: Diagnosis not present

## 2018-02-01 DIAGNOSIS — M9903 Segmental and somatic dysfunction of lumbar region: Secondary | ICD-10-CM | POA: Diagnosis not present

## 2018-02-01 DIAGNOSIS — M545 Low back pain: Secondary | ICD-10-CM | POA: Diagnosis not present

## 2018-02-05 DIAGNOSIS — I481 Persistent atrial fibrillation: Secondary | ICD-10-CM | POA: Diagnosis not present

## 2018-02-05 DIAGNOSIS — Z950 Presence of cardiac pacemaker: Secondary | ICD-10-CM | POA: Diagnosis not present

## 2018-02-05 DIAGNOSIS — G4733 Obstructive sleep apnea (adult) (pediatric): Secondary | ICD-10-CM | POA: Diagnosis not present

## 2018-02-05 DIAGNOSIS — I480 Paroxysmal atrial fibrillation: Secondary | ICD-10-CM | POA: Diagnosis not present

## 2018-02-05 DIAGNOSIS — I10 Essential (primary) hypertension: Secondary | ICD-10-CM | POA: Diagnosis not present

## 2018-02-05 DIAGNOSIS — I495 Sick sinus syndrome: Secondary | ICD-10-CM | POA: Diagnosis not present

## 2018-02-05 DIAGNOSIS — E039 Hypothyroidism, unspecified: Secondary | ICD-10-CM | POA: Diagnosis not present

## 2018-02-05 DIAGNOSIS — G250 Essential tremor: Secondary | ICD-10-CM | POA: Diagnosis not present

## 2018-02-06 DIAGNOSIS — M9905 Segmental and somatic dysfunction of pelvic region: Secondary | ICD-10-CM | POA: Diagnosis not present

## 2018-02-06 DIAGNOSIS — M545 Low back pain: Secondary | ICD-10-CM | POA: Diagnosis not present

## 2018-02-06 DIAGNOSIS — M9903 Segmental and somatic dysfunction of lumbar region: Secondary | ICD-10-CM | POA: Diagnosis not present

## 2018-02-06 DIAGNOSIS — M9902 Segmental and somatic dysfunction of thoracic region: Secondary | ICD-10-CM | POA: Diagnosis not present

## 2018-02-13 DIAGNOSIS — M9905 Segmental and somatic dysfunction of pelvic region: Secondary | ICD-10-CM | POA: Diagnosis not present

## 2018-02-13 DIAGNOSIS — M545 Low back pain: Secondary | ICD-10-CM | POA: Diagnosis not present

## 2018-02-13 DIAGNOSIS — M9902 Segmental and somatic dysfunction of thoracic region: Secondary | ICD-10-CM | POA: Diagnosis not present

## 2018-02-13 DIAGNOSIS — M9903 Segmental and somatic dysfunction of lumbar region: Secondary | ICD-10-CM | POA: Diagnosis not present

## 2018-02-15 DIAGNOSIS — R600 Localized edema: Secondary | ICD-10-CM | POA: Diagnosis not present

## 2018-02-15 DIAGNOSIS — E668 Other obesity: Secondary | ICD-10-CM | POA: Diagnosis not present

## 2018-02-15 DIAGNOSIS — F5101 Primary insomnia: Secondary | ICD-10-CM | POA: Diagnosis not present

## 2018-02-15 DIAGNOSIS — E1142 Type 2 diabetes mellitus with diabetic polyneuropathy: Secondary | ICD-10-CM | POA: Diagnosis not present

## 2018-02-15 DIAGNOSIS — G4733 Obstructive sleep apnea (adult) (pediatric): Secondary | ICD-10-CM | POA: Diagnosis not present

## 2018-02-15 DIAGNOSIS — I482 Chronic atrial fibrillation: Secondary | ICD-10-CM | POA: Diagnosis not present

## 2018-02-15 DIAGNOSIS — E034 Atrophy of thyroid (acquired): Secondary | ICD-10-CM | POA: Diagnosis not present

## 2018-02-15 DIAGNOSIS — E782 Mixed hyperlipidemia: Secondary | ICD-10-CM | POA: Diagnosis not present

## 2018-02-15 DIAGNOSIS — E1169 Type 2 diabetes mellitus with other specified complication: Secondary | ICD-10-CM | POA: Diagnosis not present

## 2018-02-15 DIAGNOSIS — I10 Essential (primary) hypertension: Secondary | ICD-10-CM | POA: Diagnosis not present

## 2018-02-15 DIAGNOSIS — Z6837 Body mass index (BMI) 37.0-37.9, adult: Secondary | ICD-10-CM | POA: Diagnosis not present

## 2018-02-15 DIAGNOSIS — R0602 Shortness of breath: Secondary | ICD-10-CM | POA: Diagnosis not present

## 2018-02-17 DIAGNOSIS — I509 Heart failure, unspecified: Secondary | ICD-10-CM | POA: Insufficient documentation

## 2018-02-17 HISTORY — DX: Heart failure, unspecified: I50.9

## 2018-02-19 DIAGNOSIS — E034 Atrophy of thyroid (acquired): Secondary | ICD-10-CM | POA: Diagnosis not present

## 2018-02-19 DIAGNOSIS — R0602 Shortness of breath: Secondary | ICD-10-CM | POA: Diagnosis not present

## 2018-02-19 DIAGNOSIS — R600 Localized edema: Secondary | ICD-10-CM | POA: Diagnosis not present

## 2018-02-19 DIAGNOSIS — Z23 Encounter for immunization: Secondary | ICD-10-CM | POA: Diagnosis not present

## 2018-02-20 ENCOUNTER — Encounter: Payer: Self-pay | Admitting: Internal Medicine

## 2018-02-20 ENCOUNTER — Telehealth: Payer: Self-pay | Admitting: *Deleted

## 2018-02-20 ENCOUNTER — Encounter: Payer: Self-pay | Admitting: *Deleted

## 2018-02-20 NOTE — Telephone Encounter (Signed)
REFERRAL SENT TO SCHEDULING, NOTES ON FILE FROM DR. [name] [id number].

## 2018-02-28 ENCOUNTER — Other Ambulatory Visit (HOSPITAL_COMMUNITY): Payer: Self-pay | Admitting: Family Medicine

## 2018-02-28 DIAGNOSIS — I509 Heart failure, unspecified: Secondary | ICD-10-CM

## 2018-02-28 DIAGNOSIS — I4891 Unspecified atrial fibrillation: Secondary | ICD-10-CM

## 2018-03-01 ENCOUNTER — Ambulatory Visit (HOSPITAL_COMMUNITY): Payer: Medicare Other | Attending: Cardiology

## 2018-03-01 ENCOUNTER — Other Ambulatory Visit: Payer: Self-pay

## 2018-03-01 DIAGNOSIS — R600 Localized edema: Secondary | ICD-10-CM | POA: Insufficient documentation

## 2018-03-01 DIAGNOSIS — I4891 Unspecified atrial fibrillation: Secondary | ICD-10-CM | POA: Diagnosis not present

## 2018-03-01 DIAGNOSIS — I509 Heart failure, unspecified: Secondary | ICD-10-CM

## 2018-03-01 DIAGNOSIS — R0600 Dyspnea, unspecified: Secondary | ICD-10-CM | POA: Insufficient documentation

## 2018-03-04 DIAGNOSIS — N183 Chronic kidney disease, stage 3 (moderate): Secondary | ICD-10-CM | POA: Diagnosis not present

## 2018-03-04 DIAGNOSIS — I5022 Chronic systolic (congestive) heart failure: Secondary | ICD-10-CM | POA: Diagnosis not present

## 2018-03-04 DIAGNOSIS — I480 Paroxysmal atrial fibrillation: Secondary | ICD-10-CM | POA: Diagnosis not present

## 2018-03-04 MED ORDER — PERFLUTREN LIPID MICROSPHERE
1.0000 mL | INTRAVENOUS | Status: DC | PRN
  Administered 2018-03-01: 2 mL via INTRAVENOUS

## 2018-03-04 MED ORDER — PERFLUTREN LIPID MICROSPHERE: 1.0000 mL | INTRAVENOUS | Status: AC | PRN

## 2018-03-12 ENCOUNTER — Encounter: Payer: Self-pay | Admitting: *Deleted

## 2018-03-12 ENCOUNTER — Encounter: Payer: Self-pay | Admitting: Cardiology

## 2018-03-12 ENCOUNTER — Ambulatory Visit (INDEPENDENT_AMBULATORY_CARE_PROVIDER_SITE_OTHER): Payer: Medicare Other | Admitting: Cardiology

## 2018-03-12 ENCOUNTER — Other Ambulatory Visit: Payer: Self-pay | Admitting: *Deleted

## 2018-03-12 VITALS — BP 124/64 | HR 60 | Ht 72.0 in | Wt 273.0 lb

## 2018-03-12 DIAGNOSIS — I481 Persistent atrial fibrillation: Secondary | ICD-10-CM

## 2018-03-12 DIAGNOSIS — Z01812 Encounter for preprocedural laboratory examination: Secondary | ICD-10-CM | POA: Diagnosis not present

## 2018-03-12 DIAGNOSIS — E785 Hyperlipidemia, unspecified: Secondary | ICD-10-CM | POA: Diagnosis not present

## 2018-03-12 DIAGNOSIS — G4733 Obstructive sleep apnea (adult) (pediatric): Secondary | ICD-10-CM

## 2018-03-12 DIAGNOSIS — I495 Sick sinus syndrome: Secondary | ICD-10-CM

## 2018-03-12 DIAGNOSIS — I4819 Other persistent atrial fibrillation: Secondary | ICD-10-CM

## 2018-03-12 DIAGNOSIS — I10 Essential (primary) hypertension: Secondary | ICD-10-CM | POA: Diagnosis not present

## 2018-03-12 NOTE — Addendum Note (Signed)
Addended by: Marciano Sequin on: 03/12/2018 04:25 PM   Modules accepted: Orders

## 2018-03-12 NOTE — Progress Notes (Signed)
Electrophysiology Office Note   Date:  03/12/2018   ID:  Paul Kent, DOB 01-03-1941, MRN [id number]  PCP:  Patient, No Pcp Per  Cardiologist:  Primary Electrophysiologist:  Romeka Scifres [name], MD    No chief complaint on file.    History of Present Illness: Paul Kent is a 77 y.o. male who is being seen today for the evaluation of atrial fibrillation at the request of Cox, Kirsten, MD. Presenting today for electrophysiology evaluation.  He has a history significant for chronic atrial fibrillation, hypertension, hyperlipidemia, sleep apnea, type 2 diabetes, sick sinus syndrome status post Biotronik pacemaker.  He had an atrial fibrillation ablation 06/2017.  He was thus switched from flecainide to amiodarone.  His main symptoms are weakness, fatigue.  He felt better the few months after his ablation in sinus rhythm.  He did not have these episodes.  Today, he denies symptoms of palpitations, chest pain, shortness of breath, orthopnea, PND, lower extremity edema, claudication, dizziness, presyncope, syncope, bleeding, or neurologic sequela. The patient is tolerating medications without difficulties.    Past Medical History:  Diagnosis Date  . Acquired bilateral hammer toes   . Arthralgia of left temporomandibular joint   . Bladder cancer ([place])   . Bradycardia    LOW HEART RATE  . Calculus of ureter   . Cellulitis of right lower limb   . Chronic atrial fibrillation ([place])   . Corns and callosities   . Disturbances of salivary secretion   . Epistaxis   . Essential hypertension   . Essential tremor   . H pylori ulcer   . Hesitancy of micturition   . Hyperlipidemia   . Hypothyroidism   . Impacted cerumen, bilateral   . Jaw pain   . Localized edema   . Male erectile disorder   . Malignant neoplasm of overlapping sites of bladder ([place])   . Other constipation   . Other fatigue   . Overweight   . Pain in right finger(s)   . Pain in right lower leg   . Peptic ulcer  disease   . Primary insomnia   . Renal stones   . 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 Shortness of breath   . Sick sinus syndrome ([place])   . Sleep apnea   . Spontaneous ecchymoses   . Squamous cell carcinoma of skin    UNSPECIFIED  . Testicular hypofunction   . Type 2 diabetes mellitus ([place]) 1994   UNCONTROLLED, COMPLICATIONS INCLUDE NEPHROPATHY AND PERIPHERAL NEUROPATHY   Past Surgical History:  Procedure Laterality Date  . ABLATION  06/2017  . HEMIARTHROPLASTY HIP  10/31/2013   IT HIP BIPOLAR  . LUMBAR SPINE SURGERY    . PACEMAKER PLACEMENT  01/29/2017  . SHOULDER SURGERY Right      Current Outpatient Medications  Medication Sig Dispense Refill  . amiodarone (PACERONE) 200 MG tablet Take 200 mg by mouth 2 (two) times daily.    Marland Kitchen atorvastatin (LIPITOR) 10 MG tablet Take 10 mg by mouth at bedtime.    . carvedilol (COREG) 12.5 MG tablet Take 12.5 mg by mouth 2 (two) times daily with a meal.    . Dulaglutide (TRULICITY) 1.5 ZY/6.0YT SOPN Inject 1.5 mg into the skin once a week. PEN INJECTOR    . eszopiclone (LUNESTA) 2 MG TABS tablet Take 2 mg by mouth at bedtime as needed for sleep. Take immediately before bedtime    . furosemide (LASIX) 40  MG tablet Take 20 mg by mouth daily.     Marland Kitchen glipiZIDE (GLUCOTROL XL) 10 MG 24 hr tablet Take 10 mg by mouth 2 (two) times daily with a meal.    . glucose blood test strip 1 each by Other route as needed for other. Use as instructed    . Insulin Glargine (BASAGLAR KWIKPEN) 100 UNIT/ML SOPN Inject 20 Units into the skin at bedtime.  3  . Insulin Pen Needle 31G X 5 MM MISC by Does not apply route. USE WITH TOUJEO    . levothyroxine (SYNTHROID, LEVOTHROID) 112 MCG tablet Take 112 mcg by mouth daily before breakfast.    . pantoprazole (PROTONIX) 40 MG tablet Take 40 mg by mouth daily.    . rivaroxaban (XARELTO) 20 MG TABS tablet Take 20 mg by mouth daily with supper.    . Testosterone  Cypionate 200 MG/ML SOLN Inject 1 mL as directed every 14 (fourteen) days.    . valsartan (DIOVAN) 320 MG tablet Take 320 mg by mouth daily.    Marland Kitchen zolpidem (AMBIEN) 10 MG tablet Take 10 mg by mouth at bedtime as needed for sleep. TAKE 1/2 TO 1 TABLET BY MOUTH AS NEEDED FOR SLEEP.    . flecainide (TAMBOCOR) 50 MG tablet Take 75 mg by mouth 2 (two) times daily.     No current facility-administered medications for this visit.     Allergies:   Propranolol; Clonidine derivatives; Hydralazine hcl; Invokana [canagliflozin]; Metformin and related; and Topamax [topiramate]   Social History:  The patient  reports that he has never smoked. He has never used smokeless tobacco. He reports that he drinks alcohol. He reports that he does not use drugs.   Family History:  The patient's family history includes Arthritis in his other; Congestive Heart Failure in his other; Diabetes type II in his other; Heart Problems in his father; Heart attack in his mother; Hyperlipidemia in his other; Transient ischemic attack in his mother; Tuberculosis in his brother.    ROS:  Please see the history of present illness.   Otherwise, review of systems is positive for weakness, fatigue.   All other systems are reviewed and negative.    PHYSICAL EXAM: VS:  BP 124/64   Pulse 60   Ht 6' (1.829 m)   Wt 273 lb (123.8 kg)   SpO2 97%   BMI 37.03 kg/m  , BMI Body mass index is 37.03 kg/m. GEN: Well nourished, well developed, in no acute distress  HEENT: normal  Neck: no JVD, carotid bruits, or masses Cardiac: RRR; no murmurs, rubs, or gallops,no edema  Respiratory:  clear to auscultation bilaterally, normal work of breathing GI: soft, nontender, nondistended, + BS MS: no deformity or atrophy  Skin: warm and dry, device pocket is well healed Neuro:  Strength and sensation are intact Psych: euthymic mood, full affect  EKG:  EKG is ordered today. Personal review of the ekg ordered shows V paced, AF   Device  interrogation is reviewed today in detail.  See PaceArt for details.   Recent Labs: No results found for requested labs within last 8760 hours.    Lipid Panel  No results found for: CHOL, TRIG, HDL, CHOLHDL, VLDL, LDLCALC, LDLDIRECT   Wt Readings from Last 3 Encounters:  03/12/18 273 lb (123.8 kg)      Other studies Reviewed: Additional studies/ records that were reviewed today include: TTE 03/01/18  Review of the above records today demonstrates:  - Left ventricle: The cavity size was  normal. Wall thickness was   increased in a pattern of mild LVH. Systolic function was mildly   reduced. The estimated ejection fraction was in the range of 45%   to 50%. Diffuse hypokinesis. - Left atrium: The atrium was mildly dilated. Volume/bsa, S: 37.6   ml/m^2. - Right ventricle: The cavity size was moderately dilated. Wall   thickness was normal. Pacer wire or catheter noted in right   ventricle.   ASSESSMENT AND PLAN:  1.  Paroxysmal atrial fibrillation: Currently on Xarelto.  Status post cryo-balloon ablation 06/20/2017.  Was previously on flecainide but has had recurrence and was put on amiodarone.  He has been in atrial fibrillation since April.  He tells me that Dr. Phillis Haggis very told him that he is not a candidate for another ablation.  We Elmer Boutelle work to get his prior ablation records.  We Kayin Osment plan for cardioversion.  This patients CHA2DS2-VASc Score and unadjusted Ischemic Stroke Rate (% per year) is equal to 4.8 % stroke rate/year from a score of 4  Above score calculated as 1 point each if present [CHF, HTN, DM, Vascular=MI/PAD/Aortic Plaque, Age if 65-74, or Male] Above score calculated as 2 points each if present [Age > 75, or Stroke/TIA/TE]  2.  Hypertension: Well-controlled today  3.  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  Device functioning appropriately.  No changes.  4.  Obstructive sleep apnea: Encouraged CPAP compliance  5.   Hypothyroidism: Currently on Synthroid  6.  Morbid obesity: Weight loss with diet and exercise encouraged  7.  Hyperlipidemia: Continue Lipitor   Current medicines are reviewed at length with the patient today.   The patient does not have concerns regarding his medicines.  The following changes were made today:  none  Labs/ tests ordered today include:  Orders Placed This Encounter  Procedures  . EKG 12-Lead   Case discussed with referring  Disposition:   FU with Saleena Tamas 3 months  Signed, Maleiah Dula [name], MD  03/12/2018 3:34 PM     [facility] [address] [place]  [address] [phone] (office) [phone] (fax)

## 2018-03-12 NOTE — H&P (View-Only) (Signed)
Electrophysiology Office Note   Date:  03/12/2018   ID:  Paul Kent, DOB [birth year], MRN [id number]  PCP:  Patient, No Pcp Per  Cardiologist:  Primary Electrophysiologist:  Nikolaos Maddocks [name], MD    No chief complaint on file.    History of Present Illness: Paul Kent is a 77 y.o. male who is being seen today for the evaluation of atrial fibrillation at the request of Cox, Kirsten, MD. Presenting today for electrophysiology evaluation.  He has a history significant for chronic atrial fibrillation, hypertension, hyperlipidemia, sleep apnea, type 2 diabetes, sick sinus syndrome status post Biotronik pacemaker.  He had an atrial fibrillation ablation 06/2017.  He was thus switched from flecainide to amiodarone.  His main symptoms are weakness, fatigue.  He felt better the few months after his ablation in sinus rhythm.  He did not have these episodes.  Today, he denies symptoms of palpitations, chest pain, shortness of breath, orthopnea, PND, lower extremity edema, claudication, dizziness, presyncope, syncope, bleeding, or neurologic sequela. The patient is tolerating medications without difficulties.    Past Medical History:  Diagnosis Date  . Acquired bilateral hammer toes   . Arthralgia of left temporomandibular joint   . Bladder cancer ([place])   . Bradycardia    LOW HEART RATE  . Calculus of ureter   . Cellulitis of right lower limb   . Chronic atrial fibrillation ([place])   . Corns and callosities   . Disturbances of salivary secretion   . Epistaxis   . Essential hypertension   . Essential tremor   . H pylori ulcer   . Hesitancy of micturition   . Hyperlipidemia   . Hypothyroidism   . Impacted cerumen, bilateral   . Jaw pain   . Localized edema   . Male erectile disorder   . Malignant neoplasm of overlapping sites of bladder ([place])   . Other constipation   . Other fatigue   . Overweight   . Pain in right finger(s)   . Pain in right lower leg   . Peptic ulcer  disease   . Primary insomnia   . Renal stones   . 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 Shortness of breath   . Sick sinus syndrome ([place])   . Sleep apnea   . Spontaneous ecchymoses   . Squamous cell carcinoma of skin    UNSPECIFIED  . Testicular hypofunction   . Type 2 diabetes mellitus ([place]) 1994   UNCONTROLLED, COMPLICATIONS INCLUDE NEPHROPATHY AND PERIPHERAL NEUROPATHY   Past Surgical History:  Procedure Laterality Date  . ABLATION  06/2017  . HEMIARTHROPLASTY HIP  10/31/2013   IT HIP BIPOLAR  . LUMBAR SPINE SURGERY    . PACEMAKER PLACEMENT  01/29/2017  . SHOULDER SURGERY Right      Current Outpatient Medications  Medication Sig Dispense Refill  . amiodarone (PACERONE) 200 MG tablet Take 200 mg by mouth 2 (two) times daily.    Marland Kitchen atorvastatin (LIPITOR) 10 MG tablet Take 10 mg by mouth at bedtime.    . carvedilol (COREG) 12.5 MG tablet Take 12.5 mg by mouth 2 (two) times daily with a meal.    . Dulaglutide (TRULICITY) 1.5 CH/8.8FO SOPN Inject 1.5 mg into the skin once a week. PEN INJECTOR    . eszopiclone (LUNESTA) 2 MG TABS tablet Take 2 mg by mouth at bedtime as needed for sleep. Take immediately before bedtime    . furosemide (LASIX) 40  MG tablet Take 20 mg by mouth daily.     Marland Kitchen glipiZIDE (GLUCOTROL XL) 10 MG 24 hr tablet Take 10 mg by mouth 2 (two) times daily with a meal.    . glucose blood test strip 1 each by Other route as needed for other. Use as instructed    . Insulin Glargine (BASAGLAR KWIKPEN) 100 UNIT/ML SOPN Inject 20 Units into the skin at bedtime.  3  . Insulin Pen Needle 31G X 5 MM MISC by Does not apply route. USE WITH TOUJEO    . levothyroxine (SYNTHROID, LEVOTHROID) 112 MCG tablet Take 112 mcg by mouth daily before breakfast.    . pantoprazole (PROTONIX) 40 MG tablet Take 40 mg by mouth daily.    . rivaroxaban (XARELTO) 20 MG TABS tablet Take 20 mg by mouth daily with supper.    . Testosterone  Cypionate 200 MG/ML SOLN Inject 1 mL as directed every 14 (fourteen) days.    . valsartan (DIOVAN) 320 MG tablet Take 320 mg by mouth daily.    Marland Kitchen zolpidem (AMBIEN) 10 MG tablet Take 10 mg by mouth at bedtime as needed for sleep. TAKE 1/2 TO 1 TABLET BY MOUTH AS NEEDED FOR SLEEP.    . flecainide (TAMBOCOR) 50 MG tablet Take 75 mg by mouth 2 (two) times daily.     No current facility-administered medications for this visit.     Allergies:   Propranolol; Clonidine derivatives; Hydralazine hcl; Invokana [canagliflozin]; Metformin and related; and Topamax [topiramate]   Social History:  The patient  reports that he has never smoked. He has never used smokeless tobacco. He reports that he drinks alcohol. He reports that he does not use drugs.   Family History:  The patient's family history includes Arthritis in his other; Congestive Heart Failure in his other; Diabetes type II in his other; Heart Problems in his father; Heart attack in his mother; Hyperlipidemia in his other; Transient ischemic attack in his mother; Tuberculosis in his brother.    ROS:  Please see the history of present illness.   Otherwise, review of systems is positive for weakness, fatigue.   All other systems are reviewed and negative.    PHYSICAL EXAM: VS:  BP 124/64   Pulse 60   Ht 6' (1.829 m)   Wt 273 lb (123.8 kg)   SpO2 97%   BMI 37.03 kg/m  , BMI Body mass index is 37.03 kg/m. GEN: Well nourished, well developed, in no acute distress  HEENT: normal  Neck: no JVD, carotid bruits, or masses Cardiac: RRR; no murmurs, rubs, or gallops,no edema  Respiratory:  clear to auscultation bilaterally, normal work of breathing GI: soft, nontender, nondistended, + BS MS: no deformity or atrophy  Skin: warm and dry, device pocket is well healed Neuro:  Strength and sensation are intact Psych: euthymic mood, full affect  EKG:  EKG is ordered today. Personal review of the ekg ordered shows V paced, AF   Device  interrogation is reviewed today in detail.  See PaceArt for details.   Recent Labs: No results found for requested labs within last 8760 hours.    Lipid Panel  No results found for: CHOL, TRIG, HDL, CHOLHDL, VLDL, LDLCALC, LDLDIRECT   Wt Readings from Last 3 Encounters:  03/12/18 273 lb (123.8 kg)      Other studies Reviewed: Additional studies/ records that were reviewed today include: TTE 03/01/18  Review of the above records today demonstrates:  - Left ventricle: The cavity size was  normal. Wall thickness was   increased in a pattern of mild LVH. Systolic function was mildly   reduced. The estimated ejection fraction was in the range of 45%   to 50%. Diffuse hypokinesis. - Left atrium: The atrium was mildly dilated. Volume/bsa, S: 37.6   ml/m^2. - Right ventricle: The cavity size was moderately dilated. Wall   thickness was normal. Pacer wire or catheter noted in right   ventricle.   ASSESSMENT AND PLAN:  1.  Paroxysmal atrial fibrillation: Currently on Xarelto.  Status post cryo-balloon ablation 06/20/2017.  Was previously on flecainide but has had recurrence and was put on amiodarone.  He has been in atrial fibrillation since April.  He tells me that Dr. Phillis Haggis very told him that he is not a candidate for another ablation.  We Nihaal Friesen work to get his prior ablation records.  We Duey Liller plan for cardioversion.  This patients CHA2DS2-VASc Score and unadjusted Ischemic Stroke Rate (% per year) is equal to 4.8 % stroke rate/year from a score of 4  Above score calculated as 1 point each if present [CHF, HTN, DM, Vascular=MI/PAD/Aortic Plaque, Age if 65-74, or Male] Above score calculated as 2 points each if present [Age > 75, or Stroke/TIA/TE]  2.  Hypertension: Well-controlled today  3.  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  Device functioning appropriately.  No changes.  4.  Obstructive sleep apnea: Encouraged CPAP compliance  5.   Hypothyroidism: Currently on Synthroid  6.  Morbid obesity: Weight loss with diet and exercise encouraged  7.  Hyperlipidemia: Continue Lipitor   Current medicines are reviewed at length with the patient today.   The patient does not have concerns regarding his medicines.  The following changes were made today:  none  Labs/ tests ordered today include:  Orders Placed This Encounter  Procedures  . EKG 12-Lead   Case discussed with referring  Disposition:   FU with Levonia Wolfley 3 months  Signed, Truda Staub [name], MD  03/12/2018 3:34 PM     [facility] [address] [place] [address] [phone] (office) [phone] (fax)

## 2018-03-12 NOTE — Addendum Note (Signed)
Addended by: Stanton Kidney on: 03/12/2018 04:24 PM   Modules accepted: Orders

## 2018-03-12 NOTE — Patient Instructions (Addendum)
Medication Instructions:  Your physician recommends that you continue on your current medications as directed. Please refer to the Current Medication list given to you today.  * If you need a refill on your cardiac medications before your next appointment, please call your pharmacy.   Labwork: Pre procedure labs today: BMET & CBC *We will only notify you of abnormal results, otherwise continue current treatment plan.  Testing/Procedures: Your physician has recommended that you have a Cardioversion (DCCV). Electrical Cardioversion uses a jolt of electricity to your heart either through paddles or wired patches attached to your chest. This is a controlled, usually prescheduled, procedure. Defibrillation is done under light anesthesia in the hospital, and you usually go home the day of the procedure. This is done to get your heart back into a normal rhythm. You are not awake for the procedure. Please see the instruction sheet given to you today.  CARDIOVERSION INSTRUCTIONS Please arrive at the [facility] main entrance of [facility] hospital on 03/22/2018 at  9:30 a.m. Do not eat or drink after midnight prior to procedure Do not take the following medications the morning of your procedure:   1. Glargine    2. Glipizide    3. Trulicity    4. Lasix Take your remaining medications as normal the morning of your procedure with a sip of water. You will need someone to drive you home at discharge   Follow-Up: Remote monitoring is used to monitor your Pacemaker of ICD from home. This monitoring reduces the number of office visits required to check your device to one time per year. It allows [place] to keep an eye on the functioning of your device to ensure it is working properly. You are scheduled for a device check from home on 06/11/2018. You may send your transmission at any time that day. If you have a wireless device, the transmission will be sent automatically. After your physician reviews your  transmission, you will receive a postcard with your next transmission date.  Your physician recommends that you schedule a follow-up appointment in: 3 months with Dr. Curt Bears.   *Please note that any paperwork needing to be filled out by the provider will need to be addressed at the front desk prior to seeing the provider. Please note that any FMLA, disability or other documents regarding health condition is subject to a $25.00 charge that must be received prior to completion of paperwork in the form of a money order or check.  Thank you for choosing [facility]!!   Trinidad Curet, RN [phone]  Any Other Special Instructions Will Be Listed Below (If Applicable).   Electrical Cardioversion Electrical cardioversion is the delivery of a jolt of electricity to restore a normal rhythm to the heart. A rhythm that is too fast or is not regular keeps the heart from pumping well. In this procedure, sticky patches or metal paddles are placed on the chest to deliver electricity to the heart from a device. This procedure may be done in an emergency if:  There is low or no blood pressure as a result of the heart rhythm.  Normal rhythm must be restored as fast as possible to protect the brain and heart from further damage.  It may save a life.  This procedure may also be done for irregular or fast heart rhythms that are not immediately life-threatening. Tell a health care provider about:  Any allergies you have.  All medicines you are taking, including vitamins, herbs, eye drops, creams, and  over-the-counter medicines.  Any problems you or family members have had with anesthetic medicines.  Any blood disorders you have.  Any surgeries you have had.  Any medical conditions you have.  Whether you are pregnant or may be pregnant. What are the risks? Generally, this is a safe procedure. However, problems may occur, including:  Allergic reactions to medicines.  A blood clot that  breaks free and travels to other parts of your body.  The possible return of an abnormal heart rhythm within hours or days after the procedure.  Your heart stopping (cardiac arrest). This is rare.  What happens before the procedure? Medicines  Your health care provider may have you start taking: ? Blood-thinning medicines (anticoagulants) so your blood does not clot as easily. ? Medicines may be given to help stabilize your heart rate and rhythm.  Ask your health care provider about changing or stopping your regular medicines. This is especially important if you are taking diabetes medicines or blood thinners. General instructions  Plan to have someone take you home from the hospital or clinic.  If you will be going home right after the procedure, plan to have someone with you for 24 hours.  Follow instructions from your health care provider about eating or drinking restrictions. What happens during the procedure?  To lower your risk of infection: ? Your health care team will wash or sanitize their hands. ? Your skin will be washed with soap.  An IV tube will be inserted into one of your veins.  You will be given a medicine to help you relax (sedative).  Sticky patches (electrodes) or metal paddles may be placed on your chest.  An electrical shock will be delivered. The procedure may vary among health care providers and hospitals. What happens after the procedure?  Your blood pressure, heart rate, breathing rate, and blood oxygen level will be monitored until the medicines you were given have worn off.  Do not drive for 24 hours if you were given a sedative.  Your heart rhythm will be watched to make sure it does not change. This information is not intended to replace advice given to you by your health care provider. Make sure you discuss any questions you have with your health care provider. Document Released: 05/26/2002 Document Revised: 02/02/2016 Document Reviewed:  12/10/2015 Elsevier Interactive Patient Education  2017 [employer].

## 2018-03-13 LAB — CBC
HEMOGLOBIN: 14.8 g/dL (ref 13.0–17.7)
Hematocrit: 45 % (ref 37.5–51.0)
MCH: 32.4 pg (ref 26.6–33.0)
MCHC: 32.9 g/dL (ref 31.5–35.7)
MCV: 99 fL — AB (ref 79–97)
Platelets: 158 10*3/uL (ref 150–450)
RBC: 4.57 x10E6/uL (ref 4.14–5.80)
RDW: 14.4 % (ref 12.3–15.4)
WBC: 6 10*3/uL (ref 3.4–10.8)

## 2018-03-13 LAB — BASIC METABOLIC PANEL
BUN/Creatinine Ratio: 13 (ref 10–24)
BUN: 23 mg/dL (ref 8–27)
CO2: 21 mmol/L (ref 20–29)
Calcium: 8.9 mg/dL (ref 8.6–10.2)
Chloride: 110 mmol/L — ABNORMAL HIGH (ref 96–106)
Creatinine, Ser: 1.71 mg/dL — ABNORMAL HIGH (ref 0.76–1.27)
GFR calc non Af Amer: 37 mL/min/{1.73_m2} — ABNORMAL LOW (ref >59)
GFR, EST AFRICAN AMERICAN: 43 mL/min/{1.73_m2} — AB (ref >59)
Glucose: 74 mg/dL (ref 65–99)
Potassium: 5 mmol/L (ref 3.5–5.2)
SODIUM: 146 mmol/L — AB (ref 134–144)

## 2018-03-20 ENCOUNTER — Telehealth: Payer: Self-pay | Admitting: *Deleted

## 2018-03-20 NOTE — Telephone Encounter (Signed)
References given to patient who is very appreciative and my help.  States I went above and beyond and I responded: "not at all, it was the right thing to do". Explained that I was more than happy to help with suggestions. Pt states he will never forget it and thanks me.

## 2018-03-20 NOTE — Telephone Encounter (Signed)
lmtcb to give pt ENT suggestions per his request.

## 2018-03-20 NOTE — Telephone Encounter (Signed)
° °  Patient returning call.  Nurse unavailable . Please call

## 2018-03-22 ENCOUNTER — Ambulatory Visit (HOSPITAL_COMMUNITY): Payer: Medicare Other | Admitting: Anesthesiology

## 2018-03-22 ENCOUNTER — Other Ambulatory Visit: Payer: Self-pay

## 2018-03-22 ENCOUNTER — Encounter (HOSPITAL_COMMUNITY)
Admission: RE | Disposition: A | Discharge status: Home / Self Care | Payer: Self-pay | Source: Ambulatory Visit | Attending: Cardiology

## 2018-03-22 ENCOUNTER — Ambulatory Visit (HOSPITAL_COMMUNITY)
Admission: RE | Admit: 2018-03-22 | Discharge: 2018-03-22 | Disposition: A | Discharge status: Home / Self Care | Payer: Medicare Other | Source: Ambulatory Visit | Attending: Cardiology | Admitting: Cardiology

## 2018-03-22 ENCOUNTER — Encounter (HOSPITAL_COMMUNITY): Payer: Self-pay | Admitting: Cardiology

## 2018-03-22 DIAGNOSIS — G250 Essential tremor: Secondary | ICD-10-CM | POA: Insufficient documentation

## 2018-03-22 DIAGNOSIS — Z8249 Family history of ischemic heart disease and other diseases of the circulatory system: Secondary | ICD-10-CM | POA: Insufficient documentation

## 2018-03-22 DIAGNOSIS — I10 Essential (primary) hypertension: Secondary | ICD-10-CM | POA: Insufficient documentation

## 2018-03-22 DIAGNOSIS — I4811 Longstanding persistent atrial fibrillation: Secondary | ICD-10-CM | POA: Diagnosis not present

## 2018-03-22 DIAGNOSIS — Z950 Presence of cardiac pacemaker: Secondary | ICD-10-CM | POA: Diagnosis not present

## 2018-03-22 DIAGNOSIS — E785 Hyperlipidemia, unspecified: Secondary | ICD-10-CM | POA: Insufficient documentation

## 2018-03-22 DIAGNOSIS — Z6837 Body mass index (BMI) 37.0-37.9, adult: Secondary | ICD-10-CM | POA: Diagnosis not present

## 2018-03-22 DIAGNOSIS — E039 Hypothyroidism, unspecified: Secondary | ICD-10-CM | POA: Diagnosis not present

## 2018-03-22 DIAGNOSIS — Z7901 Long term (current) use of anticoagulants: Secondary | ICD-10-CM | POA: Diagnosis not present

## 2018-03-22 DIAGNOSIS — Z794 Long term (current) use of insulin: Secondary | ICD-10-CM | POA: Insufficient documentation

## 2018-03-22 DIAGNOSIS — I4891 Unspecified atrial fibrillation: Secondary | ICD-10-CM | POA: Diagnosis not present

## 2018-03-22 DIAGNOSIS — E1159 Type 2 diabetes mellitus with other circulatory complications: Secondary | ICD-10-CM | POA: Diagnosis not present

## 2018-03-22 DIAGNOSIS — G4733 Obstructive sleep apnea (adult) (pediatric): Secondary | ICD-10-CM | POA: Diagnosis not present

## 2018-03-22 DIAGNOSIS — I480 Paroxysmal atrial fibrillation: Secondary | ICD-10-CM | POA: Insufficient documentation

## 2018-03-22 DIAGNOSIS — E1140 Type 2 diabetes mellitus with diabetic neuropathy, unspecified: Secondary | ICD-10-CM | POA: Insufficient documentation

## 2018-03-22 DIAGNOSIS — I4819 Other persistent atrial fibrillation: Secondary | ICD-10-CM

## 2018-03-22 HISTORY — PX: CARDIOVERSION: SHX1299

## 2018-03-22 SURGERY — CARDIOVERSION
Anesthesia: Monitor Anesthesia Care

## 2018-03-22 MED ORDER — PROPOFOL 10 MG/ML IV BOLUS
INTRAVENOUS | Status: DC | PRN
  Administered 2018-03-22: 40 mg via INTRAVENOUS
  Administered 2018-03-22: 50 mg via INTRAVENOUS

## 2018-03-22 MED ORDER — SODIUM CHLORIDE 0.9 % IV SOLN
INTRAVENOUS | Status: DC | PRN
  Administered 2018-03-22: 11:00:00 via INTRAVENOUS

## 2018-03-22 MED ORDER — LIDOCAINE HCL (CARDIAC) PF 100 MG/5ML IV SOSY
PREFILLED_SYRINGE | INTRAVENOUS | Status: DC | PRN
  Administered 2018-03-22: 40 mg via INTRAVENOUS

## 2018-03-22 NOTE — Anesthesia Postprocedure Evaluation (Signed)
Anesthesia Post Note  Patient: Paul Kent  Procedure(s) Performed: CARDIOVERSION (N/A )     Patient location during evaluation: PACU Anesthesia Type: MAC Level of consciousness: awake and alert Pain management: pain level controlled Vital Signs Assessment: post-procedure vital signs reviewed and stable Respiratory status: spontaneous breathing, nonlabored ventilation, respiratory function stable and patient connected to nasal cannula oxygen Cardiovascular status: blood pressure returned to baseline and stable Postop Assessment: no apparent nausea or vomiting Anesthetic complications: no    Last Vitals:  Vitals:   03/22/18 1145 03/22/18 1155  BP: (!) 178/71 (!) 167/93  Pulse: 65 63  Resp: 13 18  Temp:    SpO2: 98% 97%    Last Pain:  Vitals:   03/22/18 1155  TempSrc:   PainSc: 0-No pain                 Manie Bealer COKER

## 2018-03-22 NOTE — Interval H&P Note (Signed)
History and Physical Interval Note:  03/22/2018 10:36 AM  Paul Kent  has presented today for surgery, with the diagnosis of AFIB  The various methods of treatment have been discussed with the patient and family. After consideration of risks, benefits and other options for treatment, the patient has consented to  Procedure(s): CARDIOVERSION (N/A) as a surgical intervention .  The patient's history has been reviewed, patient examined, no change in status, stable for surgery.  I have reviewed the patient's chart and labs.  Questions were answered to the patient's satisfaction.     Aaronmichael Brumbaugh Harrell Gave

## 2018-03-22 NOTE — Discharge Instructions (Signed)
Electrical Cardioversion, Care After °This sheet gives you information about how to care for yourself after your procedure. Your health care provider may also give you more specific instructions. If you have problems or questions, contact your health care provider. °What can I expect after the procedure? °After the procedure, it is common to have: °· Some redness on the skin where the shocks were given. ° °Follow these instructions at home: °· Do not drive for 24 hours if you were given a medicine to help you relax (sedative). °· Take over-the-counter and prescription medicines only as told by your health care provider. °· Ask your health care provider how to check your pulse. Check it often. °· Rest for 48 hours after the procedure or as told by your health care provider. °· Avoid or limit your caffeine use as told by your health care provider. °Contact a health care provider if: °· You feel like your heart is beating too quickly or your pulse is not regular. °· You have a serious muscle cramp that does not go away. °Get help right away if: °· You have discomfort in your chest. °· You are dizzy or you feel faint. °· You have trouble breathing or you are short of breath. °· Your speech is slurred. °· You have trouble moving an arm or leg on one side of your body. °· Your fingers or toes turn cold or blue. °This information is not intended to replace advice given to you by your health care provider. Make sure you discuss any questions you have with your health care provider. °Document Released: 03/26/2013 Document Revised: 01/07/2016 Document Reviewed: 12/10/2015 °Elsevier Interactive Patient Education © 2018 [employer] ° °

## 2018-03-22 NOTE — CV Procedure (Signed)
Procedure:   DCCV  Indication:  Symptomatic atrial fibrillation  Procedure Note:  The patient signed informed consent.  He has had had therapeutic anticoagulation with rivaroxaban greater than 3 weeks.  Anesthesia was administered by Dr. Linna Caprice.  Adequate airway was maintained throughout and vital followed per protocol.  He was cardioverted x 1 with 120J of biphasic synchronized energy.  He converted to NSR.  There were no apparent complications.  The patient had normal neuro status and respiratory status post procedure with vitals stable as recorded elsewhere.    The [employer] representative was present throughout. Prior to CV, he was in atrial fibrillation and v-paced at 60 bpm. Post cardioversion, he was A-V paced with intermittent P waves indicative of sinus rhythm. Strips reviewed and given to family.  Buford Dresser, MD PhD 03/22/2018 11:27 AM

## 2018-03-22 NOTE — Transfer of Care (Signed)
Immediate Anesthesia Transfer of Care Note  Patient: Paul Kent  Procedure(s) Performed: CARDIOVERSION (N/A )  Patient Location: Endoscopy Unit  Anesthesia Type:MAC  Level of Consciousness: sedated  Airway & Oxygen Therapy: Patient connected to nasal cannula oxygen  Post-op Assessment: Post -op Vital signs reviewed and stable  Post vital signs: stable  Last Vitals:  Vitals Value Taken Time  BP    Temp    Pulse    Resp    SpO2      Last Pain:  Vitals:   03/22/18 0934  TempSrc: Oral  PainSc: 0-No pain         Complications: No apparent anesthesia complications

## 2018-03-22 NOTE — Anesthesia Preprocedure Evaluation (Addendum)
Anesthesia Evaluation  Patient identified by MRN, date of birth, ID band Patient awake    Reviewed: Allergy & Precautions, NPO status , Patient's Chart, lab work & pertinent test results  Airway Mallampati: II  TM Distance: >3 FB Neck ROM: Full    Dental  (+) Teeth Intact Temporary crown lower right:   Pulmonary    breath sounds clear to auscultation       Cardiovascular hypertension,  Rhythm:Regular Rate:Normal     Neuro/Psych    GI/Hepatic   Endo/Other  diabetes  Renal/GU      Musculoskeletal   Abdominal   Peds  Hematology   Anesthesia Other Findings   Reproductive/Obstetrics                             Anesthesia Physical Anesthesia Plan  ASA: III  Anesthesia Plan: MAC   Post-op Pain Management:    Induction: Intravenous  PONV Risk Score and Plan: Ondansetron  Airway Management Planned: Natural Airway and Simple Face Mask  Additional Equipment:   Intra-op Plan:   Post-operative Plan:   Informed Consent: I have reviewed the patients History and Physical, chart, labs and discussed the procedure including the risks, benefits and alternatives for the proposed anesthesia with the patient or authorized representative who has indicated his/her understanding and acceptance.     Plan Discussed with: CRNA and Anesthesiologist  Anesthesia Plan Comments:         Anesthesia Quick Evaluation

## 2018-03-23 ENCOUNTER — Encounter (HOSPITAL_COMMUNITY): Payer: Self-pay | Admitting: Cardiology

## 2018-03-27 ENCOUNTER — Institutional Professional Consult (permissible substitution): Payer: Medicare Other | Admitting: Cardiology

## 2018-03-27 LAB — CUP PACEART INCLINIC DEVICE CHECK
Date Time Interrogation Session: 20191009125115
Implantable Lead Implant Date: 20180813
Implantable Lead Location: 753860
Implantable Lead Model: 377
Implantable Lead Serial Number: 50011411
MDC IDC LEAD IMPLANT DT: 20180813
MDC IDC LEAD LOCATION: 753859
MDC IDC LEAD SERIAL: 49893169
MDC IDC PG IMPLANT DT: 20180813
Pulse Gen Model: 407145
Pulse Gen Serial Number: 69158272

## 2018-04-15 DIAGNOSIS — L570 Actinic keratosis: Secondary | ICD-10-CM | POA: Diagnosis not present

## 2018-04-15 DIAGNOSIS — L821 Other seborrheic keratosis: Secondary | ICD-10-CM | POA: Diagnosis not present

## 2018-04-15 DIAGNOSIS — L578 Other skin changes due to chronic exposure to nonionizing radiation: Secondary | ICD-10-CM | POA: Diagnosis not present

## 2018-04-16 DIAGNOSIS — E038 Other specified hypothyroidism: Secondary | ICD-10-CM | POA: Diagnosis not present

## 2018-04-16 DIAGNOSIS — I5022 Chronic systolic (congestive) heart failure: Secondary | ICD-10-CM | POA: Diagnosis not present

## 2018-04-16 DIAGNOSIS — I480 Paroxysmal atrial fibrillation: Secondary | ICD-10-CM | POA: Diagnosis not present

## 2018-04-17 DIAGNOSIS — H25812 Combined forms of age-related cataract, left eye: Secondary | ICD-10-CM | POA: Diagnosis not present

## 2018-04-17 DIAGNOSIS — H43811 Vitreous degeneration, right eye: Secondary | ICD-10-CM | POA: Diagnosis not present

## 2018-04-17 DIAGNOSIS — E113293 Type 2 diabetes mellitus with mild nonproliferative diabetic retinopathy without macular edema, bilateral: Secondary | ICD-10-CM | POA: Diagnosis not present

## 2018-05-23 DIAGNOSIS — E1142 Type 2 diabetes mellitus with diabetic polyneuropathy: Secondary | ICD-10-CM | POA: Diagnosis not present

## 2018-05-23 DIAGNOSIS — E782 Mixed hyperlipidemia: Secondary | ICD-10-CM | POA: Diagnosis not present

## 2018-05-23 DIAGNOSIS — I10 Essential (primary) hypertension: Secondary | ICD-10-CM | POA: Diagnosis not present

## 2018-05-24 DIAGNOSIS — E1142 Type 2 diabetes mellitus with diabetic polyneuropathy: Secondary | ICD-10-CM | POA: Diagnosis not present

## 2018-05-24 DIAGNOSIS — F5101 Primary insomnia: Secondary | ICD-10-CM | POA: Diagnosis not present

## 2018-05-24 DIAGNOSIS — E034 Atrophy of thyroid (acquired): Secondary | ICD-10-CM | POA: Diagnosis not present

## 2018-05-24 DIAGNOSIS — J342 Deviated nasal septum: Secondary | ICD-10-CM | POA: Diagnosis not present

## 2018-05-24 DIAGNOSIS — G4733 Obstructive sleep apnea (adult) (pediatric): Secondary | ICD-10-CM | POA: Diagnosis not present

## 2018-05-24 DIAGNOSIS — E668 Other obesity: Secondary | ICD-10-CM | POA: Diagnosis not present

## 2018-05-24 DIAGNOSIS — E782 Mixed hyperlipidemia: Secondary | ICD-10-CM | POA: Diagnosis not present

## 2018-05-24 DIAGNOSIS — Z6837 Body mass index (BMI) 37.0-37.9, adult: Secondary | ICD-10-CM | POA: Diagnosis not present

## 2018-05-24 DIAGNOSIS — E291 Testicular hypofunction: Secondary | ICD-10-CM | POA: Diagnosis not present

## 2018-05-24 DIAGNOSIS — I4819 Other persistent atrial fibrillation: Secondary | ICD-10-CM | POA: Diagnosis not present

## 2018-05-24 DIAGNOSIS — I130 Hypertensive heart and chronic kidney disease with heart failure and stage 1 through stage 4 chronic kidney disease, or unspecified chronic kidney disease: Secondary | ICD-10-CM | POA: Diagnosis not present

## 2018-06-03 ENCOUNTER — Encounter: Payer: Self-pay | Admitting: Cardiology

## 2018-06-03 ENCOUNTER — Ambulatory Visit (INDEPENDENT_AMBULATORY_CARE_PROVIDER_SITE_OTHER): Payer: Medicare Other | Admitting: Cardiology

## 2018-06-03 VITALS — BP 124/62 | HR 71 | Ht 72.0 in | Wt 277.0 lb

## 2018-06-03 DIAGNOSIS — G4733 Obstructive sleep apnea (adult) (pediatric): Secondary | ICD-10-CM | POA: Diagnosis not present

## 2018-06-03 DIAGNOSIS — I10 Essential (primary) hypertension: Secondary | ICD-10-CM

## 2018-06-03 DIAGNOSIS — I495 Sick sinus syndrome: Secondary | ICD-10-CM | POA: Diagnosis not present

## 2018-06-03 DIAGNOSIS — E785 Hyperlipidemia, unspecified: Secondary | ICD-10-CM | POA: Diagnosis not present

## 2018-06-03 DIAGNOSIS — I480 Paroxysmal atrial fibrillation: Secondary | ICD-10-CM | POA: Diagnosis not present

## 2018-06-03 NOTE — Patient Instructions (Addendum)
Medication Instructions:  Your physician recommends that you continue on your current medications as directed. Please refer to the Current Medication list given to you today.  *If you need a refill on your cardiac medications before your next appointment, please call your pharmacy*  Labwork: None ordered  Testing/Procedures: None ordered  Follow-Up: Remote monitoring is used to monitor your Pacemaker or ICD from home. This monitoring reduces the number of office visits required to check your device to one time per year. It allows [place] to keep an eye on the functioning of your device to ensure it is working properly. You are scheduled for a device check from home on 06/11/2018. You may send your transmission at any time that day. If you have a wireless device, the transmission will be sent automatically. After your physician reviews your transmission, you will receive a postcard with your next transmission date.  Your physician wants you to follow-up in: 6 months with Dr. Curt Bears.  You will receive a reminder letter in the mail two months in advance. If you don't receive a letter, please call our office to schedule the follow-up appointment.  Thank you for choosing [facility]!!   Trinidad Curet, RN [phone]  Any Other Special Instructions Will Be Listed Below (If Applicable).

## 2018-06-03 NOTE — Progress Notes (Signed)
Electrophysiology Office Note   Date:  06/03/2018   ID:  Paul, Kent [birth year], MRN [id number]  PCP:  [name], MD  Cardiologist:  Primary Electrophysiologist:  [name] [name], MD    No chief complaint on file.    History of Present Illness: Paul Kent is a 77 y.o. male who is being seen today for the evaluation of atrial fibrillation at the request of Cox, Kirsten, MD. Presenting today for electrophysiology evaluation.  He has a history significant for chronic atrial fibrillation, hypertension, hyperlipidemia, sleep apnea, type 2 diabetes, sick sinus syndrome status post Biotronik pacemaker.  He had an atrial fibrillation ablation 06/2017.  He was thus switched from flecainide to amiodarone.  His main symptoms are weakness, fatigue.  He felt better the few months after his ablation in sinus rhythm.  He did not have these episodes.  Today, denies symptoms of palpitations, chest pain, shortness of breath, orthopnea, PND, lower extremity edema, claudication, dizziness, presyncope, syncope, bleeding, or neurologic sequela. The patient is tolerating medications without difficulties.  Overall he is feeling well.  He continues to be able to play golf, which has improved since his cardioversion.  He has quite a bit more energy.  He is able to play up to 18 holes whereas previously he was unable to play greater than 9.   Past Medical History:  Diagnosis Date  . Acquired bilateral hammer toes   . Arthralgia of left temporomandibular joint   . Bladder cancer ([place])   . Bradycardia    LOW HEART RATE  . Calculus of ureter   . Cellulitis of right lower limb   . Chronic atrial fibrillation   . Corns and callosities   . Disturbances of salivary secretion   . Epistaxis   . Essential hypertension   . Essential tremor   . H pylori ulcer   . Hesitancy of micturition   . Hyperlipidemia   . Hypothyroidism   . Impacted cerumen, bilateral   . Jaw pain   . Localized edema    . Male erectile disorder   . Malignant neoplasm of overlapping sites of bladder ([place])   . Other constipation   . Other fatigue   . Overweight   . Pain in right finger(s)   . Pain in right lower leg   . Peptic ulcer disease   . Primary insomnia   . Renal stones   . 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 Shortness of breath   . Sick sinus syndrome ([place])   . Sleep apnea   . Spontaneous ecchymoses   . Squamous cell carcinoma of skin    UNSPECIFIED  . Testicular hypofunction   . Type 2 diabetes mellitus ([place]) 1994   UNCONTROLLED, COMPLICATIONS INCLUDE NEPHROPATHY AND PERIPHERAL NEUROPATHY   Past Surgical History:  Procedure Laterality Date  . ABLATION  06/2017  . CARDIOVERSION N/A 03/22/2018   Procedure: CARDIOVERSION;  Surgeon: Buford Dresser, MD;  Location: [facility] ENDOSCOPY;  Service: Cardiovascular;  Laterality: N/A;  . HEMIARTHROPLASTY HIP  10/31/2013   IT HIP BIPOLAR  . LUMBAR SPINE SURGERY    . PACEMAKER PLACEMENT  01/29/2017  . SHOULDER SURGERY Right      Current Outpatient Medications  Medication Sig Dispense Refill  . amiodarone (PACERONE) 200 MG tablet Take 200 mg by mouth daily.     Marland Kitchen atorvastatin (LIPITOR) 10 MG tablet Take 10 mg by mouth at bedtime.    Marland Kitchen  carvedilol (COREG) 12.5 MG tablet Take 12.5 mg by mouth 2 (two) times daily with a meal.    . Dulaglutide (TRULICITY) 1.5 WU/9.8JX SOPN Inject 1.5 mg into the skin every Sunday. At night. PEN INJECTOR    . eszopiclone (LUNESTA) 2 MG TABS tablet Take 2 mg by mouth at bedtime. Take immediately before bedtime    . furosemide (LASIX) 40 MG tablet Take 20 mg by mouth daily.     Marland Kitchen glipiZIDE (GLUCOTROL XL) 10 MG 24 hr tablet Take 10 mg by mouth 2 (two) times daily with a meal.    . glucose blood test strip 1 each by Other route as needed for other. Use as instructed    . Insulin Glargine (BASAGLAR KWIKPEN) 100 UNIT/ML SOPN Inject 20 Units into the skin at  bedtime.  3  . Insulin Pen Needle 31G X 5 MM MISC by Does not apply route. USE WITH TOUJEO    . levothyroxine (SYNTHROID, LEVOTHROID) 125 MCG tablet Take 125 mcg by mouth daily before breakfast.    . oxymetazoline (AFRIN) 0.05 % nasal spray Place 1 spray into both nostrils 2 (two) times daily as needed for congestion.    . rivaroxaban (XARELTO) 20 MG TABS tablet Take 20 mg by mouth daily with supper.    . testosterone cypionate (DEPOTESTOSTERONE CYPIONATE) 200 MG/ML injection Inject 1 mL into the muscle every 14 (fourteen) days.  0  . valsartan (DIOVAN) 320 MG tablet Take 320 mg by mouth daily.     No current facility-administered medications for this visit.     Allergies:   Propranolol; Clonidine derivatives; Hydralazine hcl; Invokana [canagliflozin]; Metformin and related; and Topamax [topiramate]   Social History:  The patient  reports that he has never smoked. He has never used smokeless tobacco. He reports current alcohol use. He reports that he does not use drugs.   Family History:  The patient's family history includes Arthritis in an other family member; Congestive Heart Failure in an other family member; Diabetes type II in an other family member; Heart Problems in his father; Heart attack in his mother; Hyperlipidemia in an other family member; Transient ischemic attack in his mother; Tuberculosis in his brother.    ROS:  Please see the history of present illness.   Otherwise, review of systems is positive for none.   All other systems are reviewed and negative.   PHYSICAL EXAM: VS:  BP 124/62   Pulse 71   Ht 6' (1.829 m)   Wt 277 lb (125.6 kg)   BMI 37.57 kg/m  , BMI Body mass index is 37.57 kg/m. GEN: Well nourished, well developed, in no acute distress  HEENT: normal  Neck: no JVD, carotid bruits, or masses Cardiac: RRR; no murmurs, rubs, or gallops,no edema  Respiratory:  clear to auscultation bilaterally, normal work of breathing GI: soft, nontender, nondistended, +  BS MS: no deformity or atrophy  Skin: warm and dry, device site well healed Neuro:  Strength and sensation are intact Psych: euthymic mood, full affect  EKG:  EKG is ordered today. Personal review of the ekg ordered shows AV paced, prolonged AV conduction, rate 71  Personal review of the device interrogation today. Results in [place]: 03/12/2018: BUN 23; Creatinine, Ser 1.71; Hemoglobin 14.8; Platelets 158; Potassium 5.0; Sodium 146    Lipid Panel  No results found for: CHOL, TRIG, HDL, CHOLHDL, VLDL, LDLCALC, LDLDIRECT   Wt Readings from Last 3 Encounters:  06/03/18 277 lb (125.6  kg)  03/12/18 273 lb (123.8 kg)      Other studies Reviewed: Additional studies/ records that were reviewed today include: TTE 03/01/18  Review of the above records today demonstrates:  - Left ventricle: The cavity size was normal. Wall thickness was   increased in a pattern of mild LVH. Systolic function was mildly   reduced. The estimated ejection fraction was in the range of 45%   to 50%. Diffuse hypokinesis. - Left atrium: The atrium was mildly dilated. Volume/bsa, S: 37.6   ml/m^2. - Right ventricle: The cavity size was moderately dilated. Wall   thickness was normal. Pacer wire or catheter noted in right   ventricle.   ASSESSMENT AND PLAN:  1.  Paroxysmal atrial fibrillation: Currently on Xarelto.  Status post cryoablation 06/20/2017.  Was previously on flecainide but had recurrence and was put on amiodarone.  He had a recent cardioversion and has remained in sinus rhythm.  No changes.  This patients CHA2DS2-VASc Score and unadjusted Ischemic Stroke Rate (% per year) is equal to 4.8 % stroke rate/year from a score of 4  Above score calculated as 1 point each if present [CHF, HTN, DM, Vascular=MI/PAD/Aortic Plaque, Age if 65-74, or Male] Above score calculated as 2 points each if present [Age > 75, or Stroke/TIA/TE]  2.  Hypertension: Well-controlled today.  No  changes.  3.  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  Device functioning appropriately.  No changes.  4.  Obstructive sleep apnea: CPAP compliance encouraged  5.  Hypothyroidism: Continue Synthroid  6.  Morbid obesity: Weight loss with diet and exercise encouraged  7.  Hyperlipidemia: Continue Lipitor   Current medicines are reviewed at length with the patient today.   The patient does not have concerns regarding his medicines.  The following changes were made today: None  Labs/ tests ordered today include:  Orders Placed This Encounter  Procedures  . EKG 12-Lead   Disposition:   FU with Lang Zingg 6 months  Signed, Zakara Parkey [name], MD  06/03/2018 11:12 AM     [facility] [address] [place] [address] [phone] (office) [phone] (fax)

## 2018-06-11 ENCOUNTER — Ambulatory Visit (INDEPENDENT_AMBULATORY_CARE_PROVIDER_SITE_OTHER): Payer: Medicare Other

## 2018-06-11 DIAGNOSIS — I495 Sick sinus syndrome: Secondary | ICD-10-CM

## 2018-06-12 LAB — CUP PACEART REMOTE DEVICE CHECK
Date Time Interrogation Session: 20191225194038
Implantable Lead Implant Date: 20180813
Implantable Lead Location: 753859
Implantable Lead Location: 753860
Implantable Lead Model: 377
Implantable Lead Model: 377
Implantable Lead Serial Number: 49893169
Implantable Pulse Generator Implant Date: 20180813
MDC IDC LEAD IMPLANT DT: 20180813
MDC IDC LEAD SERIAL: 50011411
MDC IDC PG SERIAL: 69158272

## 2018-06-13 NOTE — Progress Notes (Signed)
Remote pacemaker transmission.   

## 2018-07-01 DIAGNOSIS — G4733 Obstructive sleep apnea (adult) (pediatric): Secondary | ICD-10-CM | POA: Diagnosis not present

## 2018-07-01 DIAGNOSIS — R0981 Nasal congestion: Secondary | ICD-10-CM | POA: Insufficient documentation

## 2018-07-01 DIAGNOSIS — J342 Deviated nasal septum: Secondary | ICD-10-CM

## 2018-07-01 DIAGNOSIS — J343 Hypertrophy of nasal turbinates: Secondary | ICD-10-CM | POA: Insufficient documentation

## 2018-07-01 HISTORY — DX: Nasal congestion: R09.81

## 2018-07-01 HISTORY — DX: Hypertrophy of nasal turbinates: J34.3

## 2018-07-01 HISTORY — DX: Deviated nasal septum: J34.2

## 2018-07-02 DIAGNOSIS — M65332 Trigger finger, left middle finger: Secondary | ICD-10-CM | POA: Diagnosis not present

## 2018-07-02 DIAGNOSIS — E1142 Type 2 diabetes mellitus with diabetic polyneuropathy: Secondary | ICD-10-CM | POA: Diagnosis not present

## 2018-07-17 DIAGNOSIS — Z8551 Personal history of malignant neoplasm of bladder: Secondary | ICD-10-CM | POA: Diagnosis not present

## 2018-07-17 DIAGNOSIS — C679 Malignant neoplasm of bladder, unspecified: Secondary | ICD-10-CM | POA: Diagnosis not present

## 2018-07-17 DIAGNOSIS — N401 Enlarged prostate with lower urinary tract symptoms: Secondary | ICD-10-CM | POA: Diagnosis not present

## 2018-07-22 ENCOUNTER — Telehealth: Payer: Self-pay | Admitting: Cardiology

## 2018-07-22 DIAGNOSIS — R42 Dizziness and giddiness: Secondary | ICD-10-CM | POA: Diagnosis not present

## 2018-07-22 DIAGNOSIS — E1142 Type 2 diabetes mellitus with diabetic polyneuropathy: Secondary | ICD-10-CM | POA: Diagnosis not present

## 2018-07-22 DIAGNOSIS — I9589 Other hypotension: Secondary | ICD-10-CM | POA: Diagnosis not present

## 2018-07-22 NOTE — Telephone Encounter (Signed)
° ° ° °  Pt c/o BP issue: STAT if pt c/o blurred vision, one-sided weakness or slurred speech  1. What are your last 5 BP readings? 71/82, 113/80, 85/65, 105/67  2. Are you having any other symptoms (ex. Dizziness, headache, blurred vision, passed out)? Feels faint  3. What is your BP issue? Started feeling bad on Friday, feels faint    STAT if HR is under 50 or over 120 (normal HR is 60-100 beats per minute)  1) What is your heart rate?  107  2) Do you have a log of your heart rate readings (document readings)? 98, 103, 105  3) Do you have any other symptoms? Feels weak

## 2018-07-22 NOTE — Telephone Encounter (Signed)
Called to speak with patient RE: s/s.  No DPR on file to speak with wife though she is listed as a patient contact.  Call went directly to VM.  LM to CB to discuss concerns.

## 2018-07-22 NOTE — Telephone Encounter (Signed)
Received c/b from wife.  Requested to ask permission from pt to speak with her.  Call was disconnected.  Attempted to c/b X 3. Went to VM.  Will continue to attempt to call back.

## 2018-07-22 NOTE — Telephone Encounter (Signed)
Was finally able to speak with patient who gets verbal permission to speak with wife.  S/S started on Friday.  Wife reports he has been feeling faint off and on and BP/HR has been up and down.  71/52 HR 105, 91/58 HR 104, 141/92 HR 71.  He can not tell if heart beat has been irregular.   He has been eating and drinking well with at lesat 64 oz of water every day.  No N/V or diarrhea. Per wife, she is going to take him to be evaluated by his PCP who can do EKG there in the office.   She feels he may be back in At Fib which is a good possibility.   Advised to please have her call the office with any concerns.  Wife states understanding.

## 2018-07-24 DIAGNOSIS — E875 Hyperkalemia: Secondary | ICD-10-CM | POA: Diagnosis not present

## 2018-07-24 DIAGNOSIS — D72818 Other decreased white blood cell count: Secondary | ICD-10-CM | POA: Diagnosis not present

## 2018-07-29 DIAGNOSIS — N183 Chronic kidney disease, stage 3 (moderate): Secondary | ICD-10-CM | POA: Diagnosis not present

## 2018-07-29 DIAGNOSIS — D6959 Other secondary thrombocytopenia: Secondary | ICD-10-CM | POA: Diagnosis not present

## 2018-07-29 DIAGNOSIS — E875 Hyperkalemia: Secondary | ICD-10-CM | POA: Diagnosis not present

## 2018-09-02 DIAGNOSIS — N183 Chronic kidney disease, stage 3 (moderate): Secondary | ICD-10-CM | POA: Diagnosis not present

## 2018-09-02 DIAGNOSIS — E782 Mixed hyperlipidemia: Secondary | ICD-10-CM | POA: Diagnosis not present

## 2018-09-02 DIAGNOSIS — I10 Essential (primary) hypertension: Secondary | ICD-10-CM | POA: Diagnosis not present

## 2018-09-03 DIAGNOSIS — E1169 Type 2 diabetes mellitus with other specified complication: Secondary | ICD-10-CM | POA: Diagnosis not present

## 2018-09-04 DIAGNOSIS — E034 Atrophy of thyroid (acquired): Secondary | ICD-10-CM | POA: Diagnosis not present

## 2018-09-04 DIAGNOSIS — G4733 Obstructive sleep apnea (adult) (pediatric): Secondary | ICD-10-CM | POA: Diagnosis not present

## 2018-09-04 DIAGNOSIS — E782 Mixed hyperlipidemia: Secondary | ICD-10-CM | POA: Diagnosis not present

## 2018-09-04 DIAGNOSIS — I130 Hypertensive heart and chronic kidney disease with heart failure and stage 1 through stage 4 chronic kidney disease, or unspecified chronic kidney disease: Secondary | ICD-10-CM | POA: Diagnosis not present

## 2018-09-04 DIAGNOSIS — I4819 Other persistent atrial fibrillation: Secondary | ICD-10-CM | POA: Diagnosis not present

## 2018-09-04 DIAGNOSIS — E291 Testicular hypofunction: Secondary | ICD-10-CM | POA: Diagnosis not present

## 2018-09-04 DIAGNOSIS — Z6832 Body mass index (BMI) 32.0-32.9, adult: Secondary | ICD-10-CM | POA: Diagnosis not present

## 2018-09-04 DIAGNOSIS — E668 Other obesity: Secondary | ICD-10-CM | POA: Diagnosis not present

## 2018-09-04 DIAGNOSIS — F5101 Primary insomnia: Secondary | ICD-10-CM | POA: Diagnosis not present

## 2018-09-04 DIAGNOSIS — E1142 Type 2 diabetes mellitus with diabetic polyneuropathy: Secondary | ICD-10-CM | POA: Diagnosis not present

## 2018-09-10 ENCOUNTER — Other Ambulatory Visit: Payer: Self-pay

## 2018-09-10 ENCOUNTER — Ambulatory Visit (INDEPENDENT_AMBULATORY_CARE_PROVIDER_SITE_OTHER): Payer: Medicare Other | Admitting: *Deleted

## 2018-09-10 DIAGNOSIS — I495 Sick sinus syndrome: Secondary | ICD-10-CM | POA: Diagnosis not present

## 2018-09-10 LAB — CUP PACEART REMOTE DEVICE CHECK
Date Time Interrogation Session: 20200324110829
Implantable Lead Implant Date: 20180813
Implantable Lead Implant Date: 20180813
Implantable Lead Location: 753859
Implantable Lead Location: 753860
Implantable Lead Model: 377
Implantable Lead Model: 377
Implantable Lead Serial Number: 49893169
Implantable Pulse Generator Implant Date: 20180813
MDC IDC LEAD SERIAL: 50011411
Pulse Gen Serial Number: 69158272

## 2018-09-16 ENCOUNTER — Encounter: Payer: Self-pay | Admitting: Cardiology

## 2018-09-16 NOTE — Progress Notes (Signed)
Remote pacemaker transmission.   

## 2018-10-30 DIAGNOSIS — Z0000 Encounter for general adult medical examination without abnormal findings: Secondary | ICD-10-CM | POA: Diagnosis not present

## 2018-10-30 DIAGNOSIS — Z6830 Body mass index (BMI) 30.0-30.9, adult: Secondary | ICD-10-CM | POA: Diagnosis not present

## 2018-10-30 DIAGNOSIS — E668 Other obesity: Secondary | ICD-10-CM | POA: Diagnosis not present

## 2018-11-14 DIAGNOSIS — E1121 Type 2 diabetes mellitus with diabetic nephropathy: Secondary | ICD-10-CM | POA: Diagnosis not present

## 2018-11-14 DIAGNOSIS — E782 Mixed hyperlipidemia: Secondary | ICD-10-CM | POA: Diagnosis not present

## 2018-11-14 DIAGNOSIS — E034 Atrophy of thyroid (acquired): Secondary | ICD-10-CM | POA: Diagnosis not present

## 2018-11-14 DIAGNOSIS — G4733 Obstructive sleep apnea (adult) (pediatric): Secondary | ICD-10-CM | POA: Diagnosis not present

## 2018-11-26 ENCOUNTER — Telehealth: Payer: Self-pay | Admitting: *Deleted

## 2018-11-26 NOTE — Telephone Encounter (Signed)
Calling patient today to discuss upcoming appointment.  We are currently trying to limit exposure to the virus that causes COVID-19 by seeing patients at home rather than in the office. We would like to schedule this appointment as a Virtual Appointment VIA Smartphone or Laptop. Unable to reach patient.  LVMTCB  

## 2018-11-28 NOTE — Telephone Encounter (Signed)

## 2018-12-02 ENCOUNTER — Other Ambulatory Visit: Payer: Self-pay

## 2018-12-02 ENCOUNTER — Telehealth (INDEPENDENT_AMBULATORY_CARE_PROVIDER_SITE_OTHER): Payer: Medicare Other | Admitting: Cardiology

## 2018-12-02 DIAGNOSIS — Z950 Presence of cardiac pacemaker: Secondary | ICD-10-CM | POA: Diagnosis not present

## 2018-12-02 NOTE — Progress Notes (Signed)
Electrophysiology TeleHealth Note   Due to national recommendations of social distancing due to COVID 19, an audio/video telehealth visit is felt to be most appropriate for this patient at this time.  See Epic message for the patient's consent to telehealth for [facility].   Date:  12/02/2018   ID:  Paul Kent, DOB 02/01/1941, MRN [id number]  Location: patient's home  Provider location: [address] [place]  Evaluation Performed: Follow-up visit  PCP:  [name], MD  Cardiologist:  No primary care provider on file.  Electrophysiologist:  Dr Curt Bears  Chief Complaint:  AF  History of Present Illness:    Paul Kent is a 78 y.o. male who presents via audio/video conferencing for a telehealth visit today.  Since last being seen in our clinic, the patient reports doing very well.  Today, he denies symptoms of palpitations, chest pain, shortness of breath,  lower extremity edema, dizziness, presyncope, or syncope.  The patient is otherwise without complaint today.  The patient denies symptoms of fevers, chills, cough, or new SOB worrisome for COVID 19.  He has a history of atrial fibrillation, hypertension, hyperlipidemia, sleep apnea, type 2 diabetes, sick sinus syndrome status post Biotronik pacemaker.  He had AF ablation 07/08/2017.  Today, denies symptoms of palpitations, chest pain, shortness of breath, orthopnea, PND, lower extremity edema, claudication, dizziness, presyncope, syncope, bleeding, or neurologic sequela. The patient is tolerating medications without difficulties.  He is overall doing well.  He has noted no further episodes of atrial fibrillation.  He started diet in January and has lost 40 pounds.  He has stopped requiring insulin injections.  He says that his blood pressure has been better controlled.  He continues to exercise, playing golf multiple times a week.  Past Medical History:  Diagnosis Date  . Acquired bilateral hammer toes    . Arthralgia of left temporomandibular joint   . Bladder cancer ([place])   . Bradycardia    LOW HEART RATE  . Calculus of ureter   . Cellulitis of right lower limb   . Chronic atrial fibrillation   . Corns and callosities   . Disturbances of salivary secretion   . Epistaxis   . Essential hypertension   . Essential tremor   . H pylori ulcer   . Hesitancy of micturition   . Hyperlipidemia   . Hypothyroidism   . Impacted cerumen, bilateral   . Jaw pain   . Localized edema   . Male erectile disorder   . Malignant neoplasm of overlapping sites of bladder ([place])   . Other constipation   . Other fatigue   . Overweight   . Pain in right finger(s)   . Pain in right lower leg   . Peptic ulcer disease   . Primary insomnia   . Renal stones   . 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 Shortness of breath   . Sick sinus syndrome ([place])   . Sleep apnea   . Spontaneous ecchymoses   . Squamous cell carcinoma of skin    UNSPECIFIED  . Testicular hypofunction   . Type 2 diabetes mellitus ([place]) 1994   UNCONTROLLED, COMPLICATIONS INCLUDE NEPHROPATHY AND PERIPHERAL NEUROPATHY    Past Surgical History:  Procedure Laterality Date  . ABLATION  06/2017  . CARDIOVERSION N/A 03/22/2018   Procedure: CARDIOVERSION;  Surgeon: Buford Dresser, MD;  Location: [facility] ENDOSCOPY;  Service: Cardiovascular;  Laterality: N/A;  .  HEMIARTHROPLASTY HIP  10/31/2013   IT HIP BIPOLAR  . LUMBAR SPINE SURGERY    . PACEMAKER PLACEMENT  01/29/2017  . SHOULDER SURGERY Right     Current Outpatient Medications  Medication Sig Dispense Refill  . amiodarone (PACERONE) 200 MG tablet Take 200 mg by mouth daily.     Marland Kitchen atorvastatin (LIPITOR) 10 MG tablet Take 10 mg by mouth at bedtime.    . carvedilol (COREG) 12.5 MG tablet Take 12.5 mg by mouth 2 (two) times daily with a meal.    . eszopiclone (LUNESTA) 2 MG TABS tablet Take 2 mg by mouth at bedtime. Take  immediately before bedtime    . furosemide (LASIX) 40 MG tablet Take 20 mg by mouth daily.     Marland Kitchen glipiZIDE (GLUCOTROL XL) 10 MG 24 hr tablet Take 10 mg by mouth 2 (two) times daily with a meal.    . glucose blood test strip 1 each by Other route as needed for other. Use as instructed    . levothyroxine (SYNTHROID, LEVOTHROID) 125 MCG tablet Take 125 mcg by mouth daily before breakfast.    . oxymetazoline (AFRIN) 0.05 % nasal spray Place 1 spray into both nostrils 2 (two) times daily as needed for congestion.    . rivaroxaban (XARELTO) 20 MG TABS tablet Take 20 mg by mouth daily with supper.    . testosterone cypionate (DEPOTESTOSTERONE CYPIONATE) 200 MG/ML injection Inject 1 mL into the muscle every 14 (fourteen) days.  0  . valsartan (DIOVAN) 320 MG tablet Take 320 mg by mouth daily.    . Dulaglutide (TRULICITY) 1.5 JK/9.3OI SOPN Inject 1.5 mg into the skin every Sunday. At night. PEN INJECTOR    . Insulin Glargine (BASAGLAR KWIKPEN) 100 UNIT/ML SOPN Inject 20 Units into the skin at bedtime.  3  . Insulin Pen Needle 31G X 5 MM MISC by Does not apply route. USE WITH TOUJEO     No current facility-administered medications for this visit.     Allergies:   Propranolol, Clonidine derivatives, Hydralazine hcl, Invokana [canagliflozin], Metformin and related, and Topamax [topiramate]   Social History:  The patient  reports that he has never smoked. He has never used smokeless tobacco. He reports current alcohol use. He reports that he does not use drugs.   Family History:  The patient's  family history includes Arthritis in an other family member; Congestive Heart Failure in an other family member; Diabetes type II in an other family member; Heart Problems in his father; Heart attack in his mother; Hyperlipidemia in an other family member; Transient ischemic attack in his mother; Tuberculosis in his brother.   ROS:  Please see the history of present illness.   All other systems are personally  reviewed and negative.    Exam:    Vital Signs:  BP 134/88   Pulse 73   Wt 231 lb (104.8 kg)   BMI 31.33 kg/m   Over the phone, no acute distress, no shortness of breath.  Labs/Other Tests and Data Reviewed:    Recent Labs: 03/12/2018: BUN 23; Creatinine, Ser 1.71; Hemoglobin 14.8; Platelets 158; Potassium 5.0; Sodium 146   Wt Readings from Last 3 Encounters:  12/02/18 231 lb (104.8 kg)  06/03/18 277 lb (125.6 kg)  03/12/18 273 lb (123.8 kg)     Other studies personally reviewed: Additional studies/ records that were reviewed today include: ECG 06/03/18 personally reviewed Review of the above records today demonstrates: AV paced  Last device remote is reviewed from  PaceART PDF dated 09/10/2018 which reveals normal device function, no arrhythmias    ASSESSMENT & PLAN:    1.  Paroxysmal atrial fibrillation: Currently on Xarelto.  Status post cryoablation 06/20/2017.  He was initially put on flecainide but had recurrence and was switched to amiodarone.  He remains in sinus rhythm.  No changes.  This patients CHA2DS2-VASc Score and unadjusted Ischemic Stroke Rate (% per year) is equal to 4.8 % stroke rate/year from a score of 4  Above score calculated as 1 point each if present [CHF, HTN, DM, Vascular=MI/PAD/Aortic Plaque, Age if 65-74, or Male] Above score calculated as 2 points each if present [Age > 75, or Stroke/TIA/TE]   2.  Hypertension: Currently well controlled  3.  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  Device functioning appropriately.  No changes.  4.  Obstructive sleep apnea: CPAP compliance encouraged   COVID 19 screen The patient denies symptoms of COVID 19 at this time.  The importance of social distancing was discussed today.  Follow-up: 6 months  Current medicines are reviewed at length with the patient today.   The patient does not have concerns regarding his medicines.  The following changes were made today:  none   Labs/ tests ordered today include:  No orders of the defined types were placed in this encounter.    Patient Risk:  after full review of this patients clinical status, I feel that they are at moderate risk at this time.  Today, I have spent 10 minutes with the patient with telehealth technology discussing atrial fibrillation, weight loss.    Signed, Cola Highfill [name], MD  12/02/2018 9:10 AM     [facility] [address] [place] [address] [phone] (office) [phone] (fax)

## 2018-12-06 DIAGNOSIS — R358 Other polyuria: Secondary | ICD-10-CM | POA: Diagnosis not present

## 2018-12-06 DIAGNOSIS — E1169 Type 2 diabetes mellitus with other specified complication: Secondary | ICD-10-CM | POA: Diagnosis not present

## 2018-12-06 DIAGNOSIS — S91331A Puncture wound without foreign body, right foot, initial encounter: Secondary | ICD-10-CM | POA: Diagnosis not present

## 2018-12-06 DIAGNOSIS — I10 Essential (primary) hypertension: Secondary | ICD-10-CM | POA: Diagnosis not present

## 2018-12-06 DIAGNOSIS — E1121 Type 2 diabetes mellitus with diabetic nephropathy: Secondary | ICD-10-CM | POA: Diagnosis not present

## 2018-12-10 ENCOUNTER — Ambulatory Visit (INDEPENDENT_AMBULATORY_CARE_PROVIDER_SITE_OTHER): Payer: Medicare Other | Admitting: *Deleted

## 2018-12-10 DIAGNOSIS — I480 Paroxysmal atrial fibrillation: Secondary | ICD-10-CM | POA: Diagnosis not present

## 2018-12-12 LAB — CUP PACEART REMOTE DEVICE CHECK
Date Time Interrogation Session: 20200625092836
Implantable Lead Implant Date: 20180813
Implantable Lead Implant Date: 20180813
Implantable Lead Location: 753859
Implantable Lead Location: 753860
Implantable Lead Model: 377
Implantable Lead Model: 377
Implantable Lead Serial Number: 49893169
Implantable Lead Serial Number: 50011411
Implantable Pulse Generator Implant Date: 20180813
Pulse Gen Model: 407145
Pulse Gen Serial Number: 69158272

## 2018-12-19 NOTE — Progress Notes (Signed)
Remote pacemaker transmission.   

## 2018-12-23 ENCOUNTER — Ambulatory Visit (INDEPENDENT_AMBULATORY_CARE_PROVIDER_SITE_OTHER): Payer: Medicare Other

## 2018-12-23 ENCOUNTER — Ambulatory Visit (INDEPENDENT_AMBULATORY_CARE_PROVIDER_SITE_OTHER): Payer: Medicare Other | Admitting: Podiatry

## 2018-12-23 ENCOUNTER — Other Ambulatory Visit: Payer: Self-pay

## 2018-12-23 ENCOUNTER — Encounter: Payer: Self-pay | Admitting: Podiatry

## 2018-12-23 VITALS — Temp 97.8°F | Resp 16

## 2018-12-23 DIAGNOSIS — E1159 Type 2 diabetes mellitus with other circulatory complications: Secondary | ICD-10-CM

## 2018-12-23 DIAGNOSIS — L97511 Non-pressure chronic ulcer of other part of right foot limited to breakdown of skin: Secondary | ICD-10-CM | POA: Diagnosis not present

## 2018-12-23 MED ORDER — SILVER SULFADIAZINE 1 % EX CREA: TOPICAL_CREAM | CUTANEOUS | 0 refills | Status: DC

## 2018-12-23 NOTE — Progress Notes (Signed)
Subjective:  Patient ID: Paul Kent, male    DOB: December 20, 1940,  MRN: [id number]  Chief Complaint  Patient presents with  . Foot Ulcer    Rt sub met 2 ulcer x 3 wks; no pain/injury Tx: epsom salt soaking and cephalexin (Rx by PCP) -w/ bloody drainage -pt dnenies redness/swelling/warmth    78 y.o. male presents for wound care. Hx as above. Unsure how it started. Has neuropathy. Denies knowing stepping on something or other injury.Taking antibiotic as precaution but states it was never red or swollen.   Review of Systems: Negative except as noted in the HPI. Denies N/V/F/Ch.  Past Medical History:  Diagnosis Date  . Acquired bilateral hammer toes   . Arthralgia of left temporomandibular joint   . Bladder cancer ([place])   . Bradycardia    LOW HEART RATE  . Calculus of ureter   . Cellulitis of right lower limb   . Chronic atrial fibrillation   . Corns and callosities   . Disturbances of salivary secretion   . Epistaxis   . Essential hypertension   . Essential tremor   . H pylori ulcer   . Hesitancy of micturition   . Hyperlipidemia   . Hypothyroidism   . Impacted cerumen, bilateral   . Jaw pain   . Localized edema   . Male erectile disorder   . Malignant neoplasm of overlapping sites of bladder ([place])   . Other constipation   . Other fatigue   . Overweight   . Pain in right finger(s)   . Pain in right lower leg   . Peptic ulcer disease   . Primary insomnia   . Renal stones   . 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 Shortness of breath   . Sick sinus syndrome ([place])   . Sleep apnea   . Spontaneous ecchymoses   . Squamous cell carcinoma of skin    UNSPECIFIED  . Testicular hypofunction   . Type 2 diabetes mellitus ([place]) 1994   UNCONTROLLED, COMPLICATIONS INCLUDE NEPHROPATHY AND PERIPHERAL NEUROPATHY    Current Outpatient Medications:  .  amiodarone (PACERONE) 200 MG tablet, Take 200 mg by mouth daily. ,  Disp: , Rfl:  .  atorvastatin (LIPITOR) 10 MG tablet, Take 10 mg by mouth at bedtime., Disp: , Rfl:  .  carvedilol (COREG) 12.5 MG tablet, Take 12.5 mg by mouth 2 (two) times daily with a meal., Disp: , Rfl:  .  cephALEXin (KEFLEX) 500 MG capsule, take 1 capsule (500 mg) by oral route twice daily, Disp: , Rfl:  .  glipiZIDE (GLUCOTROL XL) 10 MG 24 hr tablet, Take 10 mg by mouth 2 (two) times daily with a meal., Disp: , Rfl:  .  levothyroxine (SYNTHROID, LEVOTHROID) 125 MCG tablet, Take 125 mcg by mouth daily before breakfast., Disp: , Rfl:  .  rivaroxaban (XARELTO) 20 MG TABS tablet, Take 20 mg by mouth daily with supper., Disp: , Rfl:  .  silver sulfADIAZINE (SILVADENE) 1 % cream, Apply pea-sized amount to wound daily., Disp: 50 g, Rfl: 0  Social History   Tobacco Use  Smoking Status Never Smoker  Smokeless Tobacco Never Used    Allergies  Allergen Reactions  . Propranolol     Drops HR too low   . Clonidine Derivatives Nausea And Vomiting  . Hydralazine Hcl     Patient had an adverse reaction   . Invokana [Canagliflozin]     Patient  had an adverse reaction  . Metformin And Related Diarrhea  . Other Diarrhea and Nausea And Vomiting  . Topamax [Topiramate]     Patient had an adverse reaction   Objective:   Vitals:   12/23/18 1400  Resp: 16  Temp: 97.8 F (36.6 C)   There is no height or weight on file to calculate BMI. Constitutional Well developed. Well nourished.  Vascular Dorsalis pedis pulses palpable bilaterally. Posterior tibial pulses non-palpable bilaterally. Capillary refill normal to all digits.  No cyanosis or clubbing noted. Pedal hair growth absent.  Neurologic Normal speech. Oriented to person, place, and time. Protective sensation absent  Dermatologic Wound Location: Right submet 2 Wound Base: Granular/Healthy Peri-wound: Calloused Exudate: None: wound tissue dry Wound Measurements: -0.3x0.3   Orthopedic: No pain to palpation either foot.    Radiographs: Taken and reviewed no acute fractures or dislocations no underlying osseous erosions. Assessment:   1. Ulcer of right foot limited to breakdown of skin ([place])   2. Type 2 diabetes mellitus with other circulatory complication, 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[place])    Plan:  Patient was evaluated and treated and all questions answered.  Ulcer right submet 2 -Debridement as below. -Dressed with silvadene, DSD. -Continue off-loading with surgical shoe. -Advised to limit activity for 2 weeks. -Advised that once abx is complete he will likely not need more given no signs of infection.  Procedure: Selective Debridement of Wound Rationale: Removal of devitalized tissue from the wound to promote healing.  Pre-Debridement Wound Measurements: 0.3 cm x 0.3 cm x 0.2 cm  Post-Debridement Wound Measurements: same as pre-debridement. Type of Debridement: sharp selective Tissue Removed: Devitalized soft-tissue Dressing: Dry, sterile, compression dressing. Disposition: Patient tolerated procedure well. Patient to return in 1 week for follow-up.    No follow-ups on file.

## 2019-01-06 ENCOUNTER — Ambulatory Visit: Payer: Medicare Other | Admitting: Podiatry

## 2019-01-13 ENCOUNTER — Ambulatory Visit (INDEPENDENT_AMBULATORY_CARE_PROVIDER_SITE_OTHER): Payer: Medicare Other | Admitting: Podiatry

## 2019-01-13 ENCOUNTER — Other Ambulatory Visit: Payer: Self-pay

## 2019-01-13 DIAGNOSIS — M2041 Other hammer toe(s) (acquired), right foot: Secondary | ICD-10-CM | POA: Diagnosis not present

## 2019-01-13 DIAGNOSIS — Q667 Congenital pes cavus, unspecified foot: Secondary | ICD-10-CM

## 2019-01-13 DIAGNOSIS — M2042 Other hammer toe(s) (acquired), left foot: Secondary | ICD-10-CM

## 2019-01-13 DIAGNOSIS — L97511 Non-pressure chronic ulcer of other part of right foot limited to breakdown of skin: Secondary | ICD-10-CM

## 2019-01-13 DIAGNOSIS — E1159 Type 2 diabetes mellitus with other circulatory complications: Secondary | ICD-10-CM | POA: Diagnosis not present

## 2019-01-13 NOTE — Progress Notes (Signed)
Subjective:  Patient ID: Paul Kent, male    DOB: 1940-07-15,  MRN: [id number]  No chief complaint on file.   78 y.o. male presents for wound care. Thinks the wound is doing much better. Denies new problems. Using silvadene and a band-aid.  Review of Systems: Negative except as noted in the HPI. Denies N/V/F/Ch.  Past Medical History:  Diagnosis Date  . Acquired bilateral hammer toes   . Arthralgia of left temporomandibular joint   . Bladder cancer ([place])   . Bradycardia    LOW HEART RATE  . Calculus of ureter   . Cellulitis of right lower limb   . Chronic atrial fibrillation   . Corns and callosities   . Disturbances of salivary secretion   . Epistaxis   . Essential hypertension   . Essential tremor   . H pylori ulcer   . Hesitancy of micturition   . Hyperlipidemia   . Hypothyroidism   . Impacted cerumen, bilateral   . Jaw pain   . Localized edema   . Male erectile disorder   . Malignant neoplasm of overlapping sites of bladder ([place])   . Other constipation   . Other fatigue   . Overweight   . Pain in right finger(s)   . Pain in right lower leg   . Peptic ulcer disease   . Primary insomnia   . Renal stones   . 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 Shortness of breath   . Sick sinus syndrome ([place])   . Sleep apnea   . Spontaneous ecchymoses   . Squamous cell carcinoma of skin    UNSPECIFIED  . Testicular hypofunction   . Type 2 diabetes mellitus ([place]) 1994   UNCONTROLLED, COMPLICATIONS INCLUDE NEPHROPATHY AND PERIPHERAL NEUROPATHY    Current Outpatient Medications:  .  amiodarone (PACERONE) 200 MG tablet, Take 200 mg by mouth daily. , Disp: , Rfl:  .  atorvastatin (LIPITOR) 10 MG tablet, Take 10 mg by mouth at bedtime., Disp: , Rfl:  .  carvedilol (COREG) 12.5 MG tablet, Take 12.5 mg by mouth 2 (two) times daily with a meal., Disp: , Rfl:  .  cephALEXin (KEFLEX) 500 MG capsule, take 1 capsule  (500 mg) by oral route twice daily, Disp: , Rfl:  .  gabapentin (NEURONTIN) 300 MG capsule, , Disp: , Rfl:  .  glipiZIDE (GLUCOTROL XL) 10 MG 24 hr tablet, Take 10 mg by mouth 2 (two) times daily with a meal., Disp: , Rfl:  .  levothyroxine (SYNTHROID, LEVOTHROID) 125 MCG tablet, Take 125 mcg by mouth daily before breakfast., Disp: , Rfl:  .  rivaroxaban (XARELTO) 20 MG TABS tablet, Take 20 mg by mouth daily with supper., Disp: , Rfl:  .  silver sulfADIAZINE (SILVADENE) 1 % cream, Apply pea-sized amount to wound daily., Disp: 50 g, Rfl: 0  Social History   Tobacco Use  Smoking Status Never Smoker  Smokeless Tobacco Never Used    Allergies  Allergen Reactions  . Propranolol     Drops HR too low   . Clonidine Derivatives Nausea And Vomiting  . Hydralazine Hcl     Patient had an adverse reaction   . Invokana [Canagliflozin]     Patient had an adverse reaction  . Metformin And Related Diarrhea  . Other Diarrhea and Nausea And Vomiting  . Topamax [Topiramate]     Patient had an adverse reaction   Objective:  There were no vitals filed for this visit. There is no height or weight on file to calculate BMI. Constitutional Well developed. Well nourished.  Vascular Dorsalis pedis pulses palpable bilaterally. Posterior tibial pulses non-palpable bilaterally. Capillary refill normal to all digits.  No cyanosis or clubbing noted. Pedal hair growth absent.  Neurologic Normal speech. Oriented to person, place, and time. Protective sensation absent  Dermatologic Wound Location: Right submet 2 Wound Base: Granular/Healthy Peri-wound: Calloused Exudate: None: wound tissue dry Wound Measurements: -0.4x0.2  Orthopedic: No pain to palpation either foot. Hammertoes bilat Pes cavus bilat   Radiographs: none Assessment:   1. Ulcer of right foot limited to breakdown of skin ([place])   2. Type 2 diabetes mellitus with other circulatory complication, without long-term current use of  insulin ([facility])   3. Hammertoes of both feet   4. Pes cavus    Plan:  Patient was evaluated and treated and all questions answered.  Ulcer right submet 2 -Debridement as below -offloading pads dispensed -Will fabricate DM shoes medically necessary to offload ulcer and prevent further ulceration.  Procedure: Selective Debridement of Wound Rationale: Removal of devitalized tissue from the wound to promote healing.  Pre-Debridement Wound Measurements: 0.4 cm x 0.2 cm x 0.2 cm  Post-Debridement Wound Measurements: same as pre-debridement. Type of Debridement: sharp selective Tissue Removed: Devitalized soft-tissue Dressing: Dry, sterile, compression dressing. Disposition: Patient tolerated procedure well. Patient to return in 1 week for follow-up.      No follow-ups on file.

## 2019-01-15 DIAGNOSIS — C679 Malignant neoplasm of bladder, unspecified: Secondary | ICD-10-CM | POA: Diagnosis not present

## 2019-01-15 DIAGNOSIS — N401 Enlarged prostate with lower urinary tract symptoms: Secondary | ICD-10-CM | POA: Diagnosis not present

## 2019-01-15 DIAGNOSIS — R972 Elevated prostate specific antigen [PSA]: Secondary | ICD-10-CM | POA: Diagnosis not present

## 2019-01-21 ENCOUNTER — Telehealth: Payer: Self-pay | Admitting: *Deleted

## 2019-01-21 NOTE — Telephone Encounter (Addendum)
Spoke with patient regarding alert received for atrial arrhythmia burden of 34% in past 24hrs. See trends below, longest atrial episode per device was ~6.5hrs on 01/19/29. V rates controlled, mean atrial rate 146bpm and has been elevated for the past few days. No EGMs available. S/p cryoablation 06/20/17 for AF.   Spoke with patient. He reports he has been doing very well. Denies any symptoms in recent days, though BP has been higher than usual at times (up to 140/90, usually runs 110s-120s/70s). Reports compliance with cardiac meds, including amiodarone, carvedilol, and Xarelto. Advised I will call back if any Dr. Curt Kent recommends any changes after reviewing information. Pt verbalizes understanding and agrees to call back if he develops any cardiac symptoms.

## 2019-01-22 NOTE — Telephone Encounter (Signed)
Patient overdue for follow up, needs clinic appointment to discuss further.

## 2019-01-22 NOTE — Telephone Encounter (Signed)
Spoke with patient. He is agreeable to appointment with Dr. Curt Bears in [place] on 01/27/19 at 9:45am to discuss episodes. No further questions at this time.

## 2019-01-27 ENCOUNTER — Encounter: Payer: Self-pay | Admitting: Cardiology

## 2019-01-27 ENCOUNTER — Encounter: Payer: Self-pay | Admitting: Podiatry

## 2019-01-27 ENCOUNTER — Ambulatory Visit (INDEPENDENT_AMBULATORY_CARE_PROVIDER_SITE_OTHER): Payer: Medicare Other | Admitting: Cardiology

## 2019-01-27 ENCOUNTER — Ambulatory Visit (INDEPENDENT_AMBULATORY_CARE_PROVIDER_SITE_OTHER): Payer: Medicare Other | Admitting: Podiatry

## 2019-01-27 ENCOUNTER — Other Ambulatory Visit: Payer: Self-pay

## 2019-01-27 VITALS — Temp 98.3°F | Resp 16

## 2019-01-27 VITALS — BP 122/68 | HR 60 | Ht 72.0 in | Wt 238.0 lb

## 2019-01-27 DIAGNOSIS — L97511 Non-pressure chronic ulcer of other part of right foot limited to breakdown of skin: Secondary | ICD-10-CM

## 2019-01-27 DIAGNOSIS — I480 Paroxysmal atrial fibrillation: Secondary | ICD-10-CM | POA: Diagnosis not present

## 2019-01-27 NOTE — Patient Instructions (Addendum)
Medication Instructions:  Your physician recommends that you continue on your current medications as directed. Please refer to the Current Medication list given to you today.  *If you need a refill on your cardiac medications before your next appointment, please call your pharmacy*  Labwork: None ordered  Testing/Procedures: None ordered  Follow-Up: Remote monitoring is used to monitor your Pacemaker or ICD from home. This monitoring reduces the number of office visits required to check your device to one time per year. It allows [place] to keep an eye on the functioning of your device to ensure it is working properly. You are scheduled for a device check from home on 03/12/2019. You may send your transmission at any time that day. If you have a wireless device, the transmission will be sent automatically. After your physician reviews your transmission, you will receive a postcard with your next transmission date.  Your physician wants you to follow-up in: 6 months with Dr. Curt Bears.  You will receive a reminder letter in the mail two months in advance. If you don't receive a letter, please call our office to schedule the follow-up appointment.  Thank you for choosing [facility]!!   Trinidad Curet, RN [phone]

## 2019-01-27 NOTE — Progress Notes (Signed)
Subjective:  Patient ID: Paul Kent, male    DOB: 05-17-1941,  MRN: [id number]  Chief Complaint  Patient presents with  . Foot Ulcer    F/U RT wound check Pt.s tates," wife says it's looking better, not completelty healed but looking much better." tx: bandaid -pt denies Redness/swelling/draingae    78 y.o. male presents for wound care. Hx as above.  Review of Systems: Negative except as noted in the HPI. Denies N/V/F/Ch.  Past Medical History:  Diagnosis Date  . Acquired bilateral hammer toes   . Arthralgia of left temporomandibular joint   . Bladder cancer ([place])   . Bradycardia    LOW HEART RATE  . Calculus of ureter   . Cellulitis of right lower limb   . Chronic atrial fibrillation   . Corns and callosities   . Disturbances of salivary secretion   . Epistaxis   . Essential hypertension   . Essential tremor   . H pylori ulcer   . Hesitancy of micturition   . Hyperlipidemia   . Hypothyroidism   . Impacted cerumen, bilateral   . Jaw pain   . Localized edema   . Male erectile disorder   . Malignant neoplasm of overlapping sites of bladder ([place])   . Other constipation   . Other fatigue   . Overweight   . Pain in right finger(s)   . Pain in right lower leg   . Peptic ulcer disease   . Primary insomnia   . Renal stones   . 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 Shortness of breath   . Sick sinus syndrome ([place])   . Sleep apnea   . Spontaneous ecchymoses   . Squamous cell carcinoma of skin    UNSPECIFIED  . Testicular hypofunction   . Type 2 diabetes mellitus ([place]) 1994   UNCONTROLLED, COMPLICATIONS INCLUDE NEPHROPATHY AND PERIPHERAL NEUROPATHY    Current Outpatient Medications:  .  amiodarone (PACERONE) 200 MG tablet, Take 200 mg by mouth daily. , Disp: , Rfl:  .  atorvastatin (LIPITOR) 10 MG tablet, Take 10 mg by mouth at bedtime., Disp: , Rfl:  .  carvedilol (COREG) 12.5 MG tablet, Take 12.5  mg by mouth 2 (two) times daily with a meal., Disp: , Rfl:  .  gabapentin (NEURONTIN) 300 MG capsule, , Disp: , Rfl:  .  glipiZIDE (GLUCOTROL XL) 10 MG 24 hr tablet, Take 10 mg by mouth 2 (two) times daily with a meal., Disp: , Rfl:  .  levothyroxine (SYNTHROID, LEVOTHROID) 125 MCG tablet, Take 125 mcg by mouth daily before breakfast., Disp: , Rfl:  .  rivaroxaban (XARELTO) 20 MG TABS tablet, Take 20 mg by mouth daily with supper., Disp: , Rfl:  .  testosterone cypionate (DEPOTESTOSTERONE CYPIONATE) 200 MG/ML injection, INJECT 1 MILLILITER IN THE MUSCLE EVERY 2 WEEKS, Disp: , Rfl:   Social History   Tobacco Use  Smoking Status Never Smoker  Smokeless Tobacco Never Used    Allergies  Allergen Reactions  . Propranolol     Drops HR too low   . Clonidine Derivatives Nausea And Vomiting  . Hydralazine Hcl     Patient had an adverse reaction   . Invokana [Canagliflozin]     Patient had an adverse reaction  . Metformin And Related Diarrhea  . Other Diarrhea and Nausea And Vomiting  . Topamax [Topiramate]     Patient had an adverse reaction   Objective:  Vitals:   01/27/19 1536  Resp: 16  Temp: 98.3 F (36.8 C)   There is no height or weight on file to calculate BMI. Constitutional Well developed. Well nourished.  Vascular Dorsalis pedis pulses palpable bilaterally. Posterior tibial pulses non-palpable bilaterally. Capillary refill normal to all digits.  No cyanosis or clubbing noted. Pedal hair growth absent.  Neurologic Normal speech. Oriented to person, place, and time. Protective sensation absent  Dermatologic Wound Location: Right submet 2 Wound Base: Granular/Healthy Peri-wound: Calloused Exudate: None: wound tissue dry Wound Measurements: -0.2x0.2  Orthopedic: No pain to palpation either foot. Hammertoes bilat Pes cavus bilat   Radiographs: none Assessment:   No diagnosis found. Plan:  Patient was evaluated and treated and all questions answered.   Ulcer right submet 2 -Minimal debridement today only slight open area remains. -Apply abx ointment and band-aid qOD -Awaiting DM shoes for offloading -F/u in 1 month.      No follow-ups on file.

## 2019-01-27 NOTE — Progress Notes (Signed)
Electrophysiology Office Note   Date:  01/27/2019   ID:  [name], Paul Kent, MRN [id number]  PCP:  [name], MD  Cardiologist:  Primary Electrophysiologist:  Will [name], MD    No chief complaint on file.    History of Present Illness: Paul Kent is a 78 y.o. male who is being seen today for the evaluation of atrial fibrillation at the request of Cox, Kirsten, MD. Presenting today for electrophysiology evaluation.  He has a history significant for chronic atrial fibrillation, hypertension, hyperlipidemia, sleep apnea, type 2 diabetes, sick sinus syndrome status post Biotronik pacemaker.  He had an atrial fibrillation ablation 06/2017.  He was thus switched from flecainide to amiodarone.  His main symptoms are weakness, fatigue.  He felt better the few months after his ablation in sinus rhythm.  He did not have these episodes.  Today, denies symptoms of palpitations, chest pain, shortness of breath, orthopnea, PND, lower extremity edema, claudication, dizziness, presyncope, syncope, bleeding, or neurologic sequela. The patient is tolerating medications without difficulties.  He is overall feeling well.  He has not had much in the way of weakness and fatigue.  He has lost 40 pounds on a new diet.  He continues to play golf and exercise.    Past Medical History:  Diagnosis Date  . Acquired bilateral hammer toes   . Arthralgia of left temporomandibular joint   . Bladder cancer ([place])   . Bradycardia    LOW HEART RATE  . Calculus of ureter   . Cellulitis of right lower limb   . Chronic atrial fibrillation   . Corns and callosities   . Disturbances of salivary secretion   . Epistaxis   . Essential hypertension   . Essential tremor   . H pylori ulcer   . Hesitancy of micturition   . Hyperlipidemia   . Hypothyroidism   . Impacted cerumen, bilateral   . Jaw pain   . Localized edema   . Male erectile disorder   . Malignant neoplasm of overlapping  sites of bladder ([place])   . Other constipation   . Other fatigue   . Overweight   . Pain in right finger(s)   . Pain in right lower leg   . Peptic ulcer disease   . Primary insomnia   . Renal stones   . 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 Shortness of breath   . Sick sinus syndrome ([place])   . Sleep apnea   . Spontaneous ecchymoses   . Squamous cell carcinoma of skin    UNSPECIFIED  . Testicular hypofunction   . Type 2 diabetes mellitus ([place]) 1994   UNCONTROLLED, COMPLICATIONS INCLUDE NEPHROPATHY AND PERIPHERAL NEUROPATHY   Past Surgical History:  Procedure Laterality Date  . ABLATION  06/2017  . CARDIOVERSION N/A 03/22/2018   Procedure: CARDIOVERSION;  Surgeon: Buford Dresser, MD;  Location[facility] ENDOSCOPY;  Service: Cardiovascular;  Laterality: N/A;  . HEMIARTHROPLASTY HIP  10/31/2013   IT HIP BIPOLAR  . LUMBAR SPINE SURGERY    . PACEMAKER PLACEMENT  01/29/2017  . SHOULDER SURGERY Right      Current Outpatient Medications  Medication Sig Dispense Refill  . amiodarone (PACERONE) 200 MG tablet Take 200 mg by mouth daily.     Marland Kitchen atorvastatin (LIPITOR) 10 MG tablet Take 10 mg by mouth at bedtime.    . carvedilol (COREG) 12.5 MG tablet Take 12.5 mg by mouth 2 (  two) times daily with a meal.    . gabapentin (NEURONTIN) 300 MG capsule     . glipiZIDE (GLUCOTROL XL) 10 MG 24 hr tablet Take 10 mg by mouth 2 (two) times daily with a meal.    . levothyroxine (SYNTHROID, LEVOTHROID) 125 MCG tablet Take 125 mcg by mouth daily before breakfast.    . rivaroxaban (XARELTO) 20 MG TABS tablet Take 20 mg by mouth daily with supper.    . testosterone cypionate (DEPOTESTOSTERONE CYPIONATE) 200 MG/ML injection INJECT 1 MILLILITER IN THE MUSCLE EVERY 2 WEEKS     No current facility-administered medications for this visit.     Allergies:   Propranolol, Clonidine derivatives, Hydralazine hcl, Invokana [canagliflozin], Metformin  and related, Other, and Topamax [topiramate]   Social History:  The patient  reports that he has never smoked. He has never used smokeless tobacco. He reports current alcohol use. He reports that he does not use drugs.   Family History:  The patient's family history includes Arthritis in an other family member; Congestive Heart Failure in an other family member; Diabetes type II in an other family member; Heart Problems in his father; Heart attack in his mother; Hyperlipidemia in an other family member; Transient ischemic attack in his mother; Tuberculosis in his brother.    ROS:  Please see the history of present illness.   Otherwise, review of systems is positive for none.   All other systems are reviewed and negative.   PHYSICAL EXAM: VS:  BP 122/68   Pulse 60   Ht 6' (1.829 m)   Wt 238 lb (108 kg)   BMI 32.28 kg/m  , BMI Body mass index is 32.28 kg/m. GEN: Well nourished, well developed, in no acute distress  HEENT: normal  Neck: no JVD, carotid bruits, or masses Cardiac: iRRR; no murmurs, rubs, or gallops,no edema  Respiratory:  clear to auscultation bilaterally, normal work of breathing GI: soft, nontender, nondistended, + BS MS: no deformity or atrophy  Skin: warm and dry, device site well healed Neuro:  Strength and sensation are intact Psych: euthymic mood, full affect  EKG:  EKG is ordered today. Personal review of the ekg ordered shows atrial fibrillation, ventricular paced  Personal review of the device interrogation today. Results in [place]: 03/12/2018: BUN 23; Creatinine, Ser 1.71; Hemoglobin 14.8; Platelets 158; Potassium 5.0; Sodium 146    Lipid Panel  No results found for: CHOL, TRIG, HDL, CHOLHDL, VLDL, LDLCALC, LDLDIRECT   Wt Readings from Last 3 Encounters:  01/27/19 238 lb (108 kg)  12/02/18 231 lb (104.8 kg)  06/03/18 277 lb (125.6 kg)      Other studies Reviewed: Additional studies/ records that were reviewed today include: TTE  03/01/18  Review of the above records today demonstrates:  - Left ventricle: The cavity size was normal. Wall thickness was   increased in a pattern of mild LVH. Systolic function was mildly   reduced. The estimated ejection fraction was in the range of 45%   to 50%. Diffuse hypokinesis. - Left atrium: The atrium was mildly dilated. Volume/bsa, S: 37.6   ml/m^2. - Right ventricle: The cavity size was moderately dilated. Wall   thickness was normal. Pacer wire or catheter noted in right   ventricle.   ASSESSMENT AND PLAN:  1.  Paroxysmal atrial fibrillation: Currently on Xarelto status post cryoablation 06/20/2017.  Is also on amiodarone.  Unfortunately, his atrial fibrillation burden has started to go up recently.  He is  not having nearly as much weakness and fatigue.  He has having short bursts mainly less than 10 minutes.  At this point, he would like to continue with his current management.  This patients CHA2DS2-VASc Score and unadjusted Ischemic Stroke Rate (% per year) is equal to 4.8 % stroke rate/year from a score of 4  Above score calculated as 1 point each if present [CHF, HTN, DM, Vascular=MI/PAD/Aortic Plaque, Age if 65-74, or Male] Above score calculated as 2 points each if present [Age > 75, or Stroke/TIA/TE]   2.  Hypertension: Currently well controlled  3.  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  Device functioning appropriately.  No changes.    4.  Obstructive sleep apnea: CPAP compliance encouraged  5.  Hypothyroidism: Continue Synthroid  6.  Morbid obesity: Weight loss with diet and exercise encouraged.  Has lost up to 40 pounds.  7.  Hyperlipidemia: Continue Lipitor   Current medicines are reviewed at length with the patient today.   The patient does not have concerns regarding his medicines.  The following changes were made today: None  Labs/ tests ordered today include:  Orders Placed This Encounter  Procedures  . EKG  12-Lead   Disposition:   FU with Will Camnitz 6 months  Signed, Will [name], MD  01/27/2019 10:09 AM     [facility] [address] [place] [address] [phone] (office) [phone] (fax)

## 2019-01-28 DIAGNOSIS — R296 Repeated falls: Secondary | ICD-10-CM | POA: Diagnosis not present

## 2019-01-28 DIAGNOSIS — M21371 Foot drop, right foot: Secondary | ICD-10-CM | POA: Diagnosis not present

## 2019-01-28 DIAGNOSIS — E785 Hyperlipidemia, unspecified: Secondary | ICD-10-CM | POA: Diagnosis not present

## 2019-01-28 DIAGNOSIS — M25371 Other instability, right ankle: Secondary | ICD-10-CM | POA: Diagnosis not present

## 2019-01-28 DIAGNOSIS — R260 Ataxic gait: Secondary | ICD-10-CM | POA: Diagnosis not present

## 2019-01-28 DIAGNOSIS — E118 Type 2 diabetes mellitus with unspecified complications: Secondary | ICD-10-CM | POA: Diagnosis not present

## 2019-01-29 ENCOUNTER — Other Ambulatory Visit: Payer: Medicare Other | Admitting: Orthotics

## 2019-02-03 ENCOUNTER — Other Ambulatory Visit (HOSPITAL_COMMUNITY): Payer: Self-pay | Admitting: Medical

## 2019-02-03 ENCOUNTER — Encounter: Payer: Self-pay | Admitting: *Deleted

## 2019-02-03 ENCOUNTER — Other Ambulatory Visit: Payer: Self-pay | Admitting: Medical

## 2019-02-03 DIAGNOSIS — R260 Ataxic gait: Secondary | ICD-10-CM

## 2019-02-03 DIAGNOSIS — R296 Repeated falls: Secondary | ICD-10-CM

## 2019-02-03 DIAGNOSIS — M21371 Foot drop, right foot: Secondary | ICD-10-CM

## 2019-02-04 ENCOUNTER — Other Ambulatory Visit (HOSPITAL_COMMUNITY): Payer: Self-pay | Admitting: Medical

## 2019-02-04 ENCOUNTER — Encounter: Payer: Self-pay | Admitting: Diagnostic Neuroimaging

## 2019-02-04 ENCOUNTER — Ambulatory Visit (INDEPENDENT_AMBULATORY_CARE_PROVIDER_SITE_OTHER): Payer: Medicare Other | Admitting: Diagnostic Neuroimaging

## 2019-02-04 ENCOUNTER — Other Ambulatory Visit: Payer: Self-pay

## 2019-02-04 ENCOUNTER — Other Ambulatory Visit: Payer: Self-pay | Admitting: Medical

## 2019-02-04 VITALS — BP 116/66 | HR 60 | Temp 97.8°F | Ht 75.0 in | Wt 235.0 lb

## 2019-02-04 DIAGNOSIS — R296 Repeated falls: Secondary | ICD-10-CM

## 2019-02-04 DIAGNOSIS — M21371 Foot drop, right foot: Secondary | ICD-10-CM | POA: Diagnosis not present

## 2019-02-04 DIAGNOSIS — R260 Ataxic gait: Secondary | ICD-10-CM

## 2019-02-04 NOTE — Progress Notes (Signed)
[facility]  PATIENT: Paul Kent DOB: Mar 04, 1941  REFERRING CLINICIAN: Rollene Rotunda, PA HISTORY FROM: patient and wife  REASON FOR VISIT: new consult    HISTORICAL  CHIEF COMPLAINT:  Chief Complaint  Patient presents with  . Ataxia, falls, foot drop    rm 7 New Pt,  wife Paul Kent    HISTORY OF PRESENT ILLNESS:   78 year old male here for evaluation of right foot drop.  January 21, 2019 patient was at the gym, on the exercise bike when all of a sudden he felt a pull and pain sensation in his right anterolateral leg below the knee.  Over the next few days he noticed increasing pain and weakness with right foot drop.  No pain or problem above his right knee.  As a result of this foot drop he fell down hard 1 day.  No low back pain.  Symptoms have stabilized.  Patient has history of diabetes and has numbness been bilateral feet.  He also has history of left lumbar shingles 8 years ago with resultant left leg weakness.  Patient uses a cane for ambulation and balance.  Patient also had 40 pound weight loss over the past 5 months, intentional with diet and exercise.  Patient also has habitual leg crossing with right leg over left.   REVIEW OF SYSTEMS: Full 14 system review of systems performed and negative with exception of: As per HPI.  ALLERGIES: Allergies  Allergen Reactions  . Propranolol     Drops HR too low   . Clonidine Derivatives Nausea And Vomiting  . Hydralazine Hcl     Patient had an adverse reaction   . Invokana [Canagliflozin]     Patient had an adverse reaction  . Metformin And Related Diarrhea  . Other Diarrhea and Nausea And Vomiting  . Topamax [Topiramate]     Patient had an adverse reaction    HOME MEDICATIONS: Outpatient Medications Prior to Visit  Medication Sig Dispense Refill  . amiodarone (PACERONE) 200 MG tablet Take 200 mg by mouth daily.     Marland Kitchen atorvastatin (LIPITOR) 10 MG tablet Take 10 mg by mouth at bedtime.    .  carvedilol (COREG) 12.5 MG tablet Take 12.5 mg by mouth 2 (two) times daily with a meal.    . gabapentin (NEURONTIN) 300 MG capsule     . glipiZIDE (GLUCOTROL XL) 10 MG 24 hr tablet Take 10 mg by mouth 2 (two) times daily with a meal.    . levothyroxine (SYNTHROID, LEVOTHROID) 125 MCG tablet Take 125 mcg by mouth daily before breakfast.    . rivaroxaban (XARELTO) 20 MG TABS tablet Take 20 mg by mouth daily with supper.    . testosterone cypionate (DEPOTESTOSTERONE CYPIONATE) 200 MG/ML injection INJECT 1 MILLILITER IN THE MUSCLE EVERY 2 WEEKS     No facility-administered medications prior to visit.     PAST MEDICAL HISTORY: Past Medical History:  Diagnosis Date  . Acquired bilateral hammer toes   . Arthralgia of left temporomandibular joint   . Atrial fibrillation ([place]) 05/2016  . Bladder cancer ([place])   . Bradycardia    LOW HEART RATE  . Calculus of ureter   . Cellulitis of right lower limb   . CHF (congestive heart failure) ([place]) 02/2018  . Chronic atrial fibrillation   . Corns and callosities   . Disturbances of salivary secretion   . Epistaxis   . Essential hypertension   . Essential tremor   . H pylori ulcer   .  Hesitancy of micturition   . Hyperlipidemia   . Hypothyroidism   . Impacted cerumen, bilateral   . Jaw pain   . Localized edema   . Male erectile disorder   . Malignant neoplasm of overlapping sites of bladder ([place])   . Other constipation   . Other fatigue   . Overweight   . Pain in right finger(s)   . Pain in right lower leg   . Peptic ulcer disease   . Primary insomnia   . Renal stones   . Renal stones   . 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 Shingles 06/2012  . Shortness of breath   . Sick sinus syndrome ([place])   . Sleep apnea    dx 08/2015, CPAP  . Spontaneous ecchymoses   . Squamous cell carcinoma of skin    UNSPECIFIED  . Testicular hypofunction   . Type 2 diabetes mellitus ([place])  1994   UNCONTROLLED, COMPLICATIONS INCLUDE NEPHROPATHY AND PERIPHERAL NEUROPATHY    PAST SURGICAL HISTORY: Past Surgical History:  Procedure Laterality Date  . ABLATION  06/2017  . CARDIOVERSION N/A 03/22/2018   Procedure: CARDIOVERSION;  Surgeon: Buford Dresser, MD;  Location: [facility] ENDOSCOPY;  Service: Cardiovascular;  Laterality: N/A;  . HEMIARTHROPLASTY HIP  10/31/2013   IT HIP BIPOLAR  . LUMBAR SPINE SURGERY    . PACEMAKER PLACEMENT  01/29/2017  . SHOULDER SURGERY Right     FAMILY HISTORY: Family History  Problem Relation Age of Onset  . Heart attack Mother   . Transient ischemic attack Mother   . Heart Problems Father   . Tuberculosis Brother   . Diabetes type II Other   . Hyperlipidemia Other   . Congestive Heart Failure Other   . Arthritis Other     SOCIAL HISTORY: Social History   Socioeconomic History  . Marital status: Married    Spouse name: Paul Kent  . Number of children: 3  . Years of education: Not on file  . Highest education level: Not on file  Occupational History  . Occupation: RETIRED    Comment: school principal  Social Needs  . Financial resource strain: Not on file  . Food insecurity    Worry: Not on file    Inability: Not on file  . Transportation needs    Medical: Not on file    Non-medical: Not on file  Tobacco Use  . Smoking status: Never Smoker  . Smokeless tobacco: Never Used  Substance and Sexual Activity  . Alcohol use: Yes  . Drug use: Never  . Sexual activity: Not on file  Lifestyle  . Physical activity    Days per week: Not on file    Minutes per session: Not on file  . Stress: Not on file  Relationships  . Social Herbalist on phone: Not on file    Gets together: Not on file    Attends religious service: Not on file    Active member of club or organization: Not on file    Attends meetings of clubs or organizations: Not on file    Relationship status: Not on file  . Intimate partner violence    Fear of  current or ex partner: Not on file    Emotionally abused: Not on file    Physically abused: Not on file    Forced sexual activity: Not on file  Other Topics Concern  . Not on file  Social History  Narrative   Lives with wife     PHYSICAL EXAM  GENERAL EXAM/CONSTITUTIONAL: Vitals:  Vitals:   02/04/19 1000  BP: 116/66  Pulse: 60  Temp: 97.8 F (36.6 C)  Weight: 235 lb (106.6 kg)  Height: 6\' 3"  (1.905 m)     Body mass index is 29.37 kg/m. Wt Readings from Last 3 Encounters:  02/04/19 235 lb (106.6 kg)  01/27/19 238 lb (108 kg)  12/02/18 231 lb (104.8 kg)     Patient is in no distress; well developed, nourished and groomed; neck is supple  CARDIOVASCULAR:  Examination of carotid arteries is normal; no carotid bruits  Regular rate and rhythm, no murmurs  Examination of peripheral vascular system by observation and palpation is normal  EYES:  Ophthalmoscopic exam of optic discs and posterior segments is normal; no papilledema or hemorrhages  No exam data present  MUSCULOSKELETAL:  Gait, strength, tone, movements noted in Neurologic exam below  NEUROLOGIC: MENTAL STATUS:  No flowsheet data found.  awake, alert, oriented to person, place and time  recent and remote memory intact  normal attention and concentration  language fluent, comprehension intact, naming intact  fund of knowledge appropriate  CRANIAL NERVE:   2nd - no papilledema on fundoscopic exam  2nd, 3rd, 4th, 6th - pupils equal and reactive to light, visual fields full to confrontation, extraocular muscles intact, no nystagmus  5th - facial sensation symmetric  7th - facial strength symmetric  8th - hearing intact  9th - palate elevates symmetrically, uvula midline  11th - shoulder shrug symmetric  12th - tongue protrusion midline  MOTOR:   normal bulk and tone, full strength in the BUE  RLE 5 EXCEPT FOOT DORSIFLEXION 3 AND EVERSION 2  LLE 5 EXCEPT FOOT DORSIFLEXION 4+     SENSORY:   normal and symmetric to light touch; ABSENT IN TOES  COORDINATION:   finger-nose-finger, fine finger movements --> ACTION TREMOR  REFLEXES:   deep tendon reflexes TRACE and symmetric; ABSENT IN BLE  GAIT/STATION:   STEPPAGE GAIT (RIGHT > LEFT FOOT DROP); UNSTEADY; USES SINGLE POINT CANE     DIAGNOSTIC DATA (LABS, IMAGING, TESTING) - I reviewed patient records, labs, notes, testing and imaging myself where available.  Lab Results  Component Value Date   WBC 6.0 03/12/2018   HGB 14.8 03/12/2018   HCT 45.0 03/12/2018   MCV 99 (H) 03/12/2018   PLT 158 03/12/2018      Component Value Date/Time   NA 146 (H) 03/12/2018 1625   K 5.0 03/12/2018 1625   CL 110 (H) 03/12/2018 1625   CO2 21 03/12/2018 1625   GLUCOSE 74 03/12/2018 1625   BUN 23 03/12/2018 1625   CREATININE 1.71 (H) 03/12/2018 1625   CALCIUM 8.9 03/12/2018 1625   GFRNONAA 37 (L) 03/12/2018 1625   GFRAA 43 (L) 03/12/2018 1625   No results found for: CHOL, HDL, LDLCALC, LDLDIRECT, TRIG, CHOLHDL No results found for: HGBA1C No results found for: VITAMINB12 No results found for: TSH    ASSESSMENT AND PLAN  78 y.o. year old male here with new onset right foot drop in August 2020, approximately 2 weeks ago.  Likely related to right peroneal neuropathy.  Dx:  1. 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 Ambulatory referral to Physical Therapy   Return for pending if symptoms worsen or fail to improve.    Penni Bombard, MD 6/77/3736, 68:15 AM Certified in Neurology, Neurophysiology and Neuroimaging  [facility] [address], [place],  [place] [address] [phone]

## 2019-02-11 DIAGNOSIS — R262 Difficulty in walking, not elsewhere classified: Secondary | ICD-10-CM | POA: Diagnosis not present

## 2019-02-11 DIAGNOSIS — M21371 Foot drop, right foot: Secondary | ICD-10-CM | POA: Diagnosis not present

## 2019-02-13 DIAGNOSIS — R262 Difficulty in walking, not elsewhere classified: Secondary | ICD-10-CM | POA: Diagnosis not present

## 2019-02-13 DIAGNOSIS — M21371 Foot drop, right foot: Secondary | ICD-10-CM | POA: Diagnosis not present

## 2019-02-17 DIAGNOSIS — M21371 Foot drop, right foot: Secondary | ICD-10-CM | POA: Diagnosis not present

## 2019-02-17 DIAGNOSIS — R262 Difficulty in walking, not elsewhere classified: Secondary | ICD-10-CM | POA: Diagnosis not present

## 2019-02-19 DIAGNOSIS — M21371 Foot drop, right foot: Secondary | ICD-10-CM | POA: Diagnosis not present

## 2019-02-19 DIAGNOSIS — R262 Difficulty in walking, not elsewhere classified: Secondary | ICD-10-CM | POA: Diagnosis not present

## 2019-02-25 ENCOUNTER — Telehealth: Payer: Self-pay | Admitting: Emergency Medicine

## 2019-02-25 NOTE — Telephone Encounter (Signed)
LMOM. Need to assess patient for symptoms and to confirm taking Amiodorone & Coreg. 2 episodes of AT ton 9/6 and 02/24/19. Episode on 02/24/19 started at 4;45 am and lasted 5 hrs and 22 min.

## 2019-02-26 ENCOUNTER — Other Ambulatory Visit: Payer: Self-pay

## 2019-02-26 ENCOUNTER — Ambulatory Visit: Payer: Medicare Other | Admitting: Orthotics

## 2019-02-26 DIAGNOSIS — E1159 Type 2 diabetes mellitus with other circulatory complications: Secondary | ICD-10-CM

## 2019-02-26 DIAGNOSIS — M2041 Other hammer toe(s) (acquired), right foot: Secondary | ICD-10-CM

## 2019-02-26 DIAGNOSIS — Q667 Congenital pes cavus, unspecified foot: Secondary | ICD-10-CM

## 2019-02-26 NOTE — Progress Notes (Signed)

## 2019-02-26 NOTE — Telephone Encounter (Signed)
Per Dr. Curt Bears OV note from 01/27/19, patient wishes to continue management of atrial arrhythmias. Will continue to monitor remotely via Home Monitoring for persistent atrial arrhythmias.

## 2019-02-27 ENCOUNTER — Telehealth: Payer: Self-pay | Admitting: Cardiology

## 2019-02-27 NOTE — Telephone Encounter (Signed)
Returned call to pt he states that he is returning cindy's call

## 2019-02-27 NOTE — Telephone Encounter (Signed)
Patient was unaware of AT episodes on 9/6 and 02/24/2019. He experienced no SOB, CP or palpitations felt by patient. Confirmed patient is taking his Coreg, Amiodorone and Xarelto.

## 2019-02-27 NOTE — Telephone Encounter (Signed)
Pt returned call and would like a call back after 37min going to eat lunch.

## 2019-03-12 ENCOUNTER — Ambulatory Visit (INDEPENDENT_AMBULATORY_CARE_PROVIDER_SITE_OTHER): Payer: Medicare Other | Admitting: *Deleted

## 2019-03-12 DIAGNOSIS — I495 Sick sinus syndrome: Secondary | ICD-10-CM | POA: Diagnosis not present

## 2019-03-12 DIAGNOSIS — Z961 Presence of intraocular lens: Secondary | ICD-10-CM | POA: Diagnosis not present

## 2019-03-12 DIAGNOSIS — H25812 Combined forms of age-related cataract, left eye: Secondary | ICD-10-CM | POA: Diagnosis not present

## 2019-03-12 DIAGNOSIS — E113393 Type 2 diabetes mellitus with moderate nonproliferative diabetic retinopathy without macular edema, bilateral: Secondary | ICD-10-CM | POA: Diagnosis not present

## 2019-03-12 LAB — CUP PACEART REMOTE DEVICE CHECK
Date Time Interrogation Session: 20200923104210
Implantable Lead Implant Date: 20180813
Implantable Lead Implant Date: 20180813
Implantable Lead Location: 753859
Implantable Lead Location: 753860
Implantable Lead Model: 377
Implantable Lead Model: 377
Implantable Lead Serial Number: 49893169
Implantable Lead Serial Number: 50011411
Implantable Pulse Generator Implant Date: 20180813
Pulse Gen Model: 407145
Pulse Gen Serial Number: 69158272

## 2019-03-13 DIAGNOSIS — E034 Atrophy of thyroid (acquired): Secondary | ICD-10-CM | POA: Diagnosis not present

## 2019-03-13 DIAGNOSIS — G250 Essential tremor: Secondary | ICD-10-CM | POA: Diagnosis not present

## 2019-03-13 DIAGNOSIS — E1121 Type 2 diabetes mellitus with diabetic nephropathy: Secondary | ICD-10-CM | POA: Diagnosis not present

## 2019-03-13 DIAGNOSIS — Z23 Encounter for immunization: Secondary | ICD-10-CM | POA: Diagnosis not present

## 2019-03-13 DIAGNOSIS — G4733 Obstructive sleep apnea (adult) (pediatric): Secondary | ICD-10-CM | POA: Diagnosis not present

## 2019-03-13 DIAGNOSIS — I4891 Unspecified atrial fibrillation: Secondary | ICD-10-CM | POA: Diagnosis not present

## 2019-03-13 DIAGNOSIS — E782 Mixed hyperlipidemia: Secondary | ICD-10-CM | POA: Diagnosis not present

## 2019-03-13 DIAGNOSIS — M21371 Foot drop, right foot: Secondary | ICD-10-CM | POA: Diagnosis not present

## 2019-03-18 ENCOUNTER — Telehealth: Payer: Self-pay | Admitting: Cardiology

## 2019-03-18 ENCOUNTER — Encounter: Payer: Self-pay | Admitting: Cardiology

## 2019-03-18 NOTE — Telephone Encounter (Signed)
New Message:  Patient wanted to know if it is safe for him to use a TENS unit on the peroneal nerve on his leg. It has been damaged, and he is in the rehab process to heal it.  He is concerned because of the pacemaker he has. He does not want to use it if it is not safe for the pacemaker.   Please advise

## 2019-03-18 NOTE — Progress Notes (Signed)
Remote pacemaker transmission.   

## 2019-03-19 NOTE — Telephone Encounter (Signed)
Consulted Biotronik representative concerning TENS unit and informed patient that it is not recommended to use TENS unit due to his device.

## 2019-03-20 ENCOUNTER — Telehealth: Payer: Self-pay

## 2019-03-20 NOTE — Telephone Encounter (Signed)
-----   Message from Will [name], MD sent at 03/14/2019  2:03 PM EDT ----- Abnormal device interrogation reviewed.  Lead parameters and battery status stable.  In AF. If symptomatic will potentially need cardioversion.

## 2019-03-20 NOTE — Telephone Encounter (Signed)
Spoke with the opt re; his remote device check and he reports that he feels very well, has been playing golf twice a week and does not notice he is in afib.. he will call if he develops any problems..I advised him the option of cardioversion if he does not feel well and he will let us know if anything changes.

## 2019-03-24 DIAGNOSIS — E1121 Type 2 diabetes mellitus with diabetic nephropathy: Secondary | ICD-10-CM | POA: Diagnosis not present

## 2019-03-24 DIAGNOSIS — E782 Mixed hyperlipidemia: Secondary | ICD-10-CM | POA: Diagnosis not present

## 2019-03-24 DIAGNOSIS — E034 Atrophy of thyroid (acquired): Secondary | ICD-10-CM | POA: Diagnosis not present

## 2019-03-26 ENCOUNTER — Telehealth: Payer: Self-pay

## 2019-03-26 MED ORDER — CARVEDILOL 12.5 MG PO TABS: 12.5000 mg | ORAL_TABLET | Freq: Two times a day (BID) | ORAL | 2 refills | Status: DC

## 2019-03-26 NOTE — Telephone Encounter (Signed)
Carvedilol sent to [facility].

## 2019-04-02 DIAGNOSIS — M65331 Trigger finger, right middle finger: Secondary | ICD-10-CM | POA: Diagnosis not present

## 2019-04-09 ENCOUNTER — Encounter: Payer: Self-pay | Admitting: Gastroenterology

## 2019-04-15 DIAGNOSIS — E113393 Type 2 diabetes mellitus with moderate nonproliferative diabetic retinopathy without macular edema, bilateral: Secondary | ICD-10-CM | POA: Diagnosis not present

## 2019-04-21 DIAGNOSIS — L821 Other seborrheic keratosis: Secondary | ICD-10-CM | POA: Diagnosis not present

## 2019-04-21 DIAGNOSIS — L570 Actinic keratosis: Secondary | ICD-10-CM | POA: Diagnosis not present

## 2019-04-21 DIAGNOSIS — L578 Other skin changes due to chronic exposure to nonionizing radiation: Secondary | ICD-10-CM | POA: Diagnosis not present

## 2019-04-21 DIAGNOSIS — C44622 Squamous cell carcinoma of skin of right upper limb, including shoulder: Secondary | ICD-10-CM | POA: Diagnosis not present

## 2019-05-19 ENCOUNTER — Ambulatory Visit (INDEPENDENT_AMBULATORY_CARE_PROVIDER_SITE_OTHER): Payer: Medicare Other | Admitting: Podiatry

## 2019-05-19 ENCOUNTER — Other Ambulatory Visit: Payer: Self-pay

## 2019-05-19 DIAGNOSIS — L97511 Non-pressure chronic ulcer of other part of right foot limited to breakdown of skin: Secondary | ICD-10-CM | POA: Diagnosis not present

## 2019-05-19 NOTE — Progress Notes (Signed)
Subjective:  Patient ID: Paul Kent, male    DOB: 1941/05/19,  MRN: [id number]  Chief Complaint  Patient presents with  . Wound Check    F/U Rt wound chec Pt. states," wasn;t doing too good when I called, but it feels better and looks bettter now. " -pt dneis redness/swelling/driange Tx: abx ointment and bandaid   . Diabetes    FBS: 118 A1C; 6    78 y.o. male presents for wound care. Hx as above.  Review of Systems: Negative except as noted in the HPI. Denies N/V/F/Ch.  Past Medical History:  Diagnosis Date  . Acquired bilateral hammer toes   . Arthralgia of left temporomandibular joint   . Atrial fibrillation ([place]) 05/2016  . Bladder cancer ([place])   . Bradycardia    LOW HEART RATE  . Calculus of ureter   . Cellulitis of right lower limb   . CHF (congestive heart failure) ([place]) 02/2018  . Chronic atrial fibrillation   . Corns and callosities   . Disturbances of salivary secretion   . Epistaxis   . Essential hypertension   . Essential tremor   . H pylori ulcer   . Hesitancy of micturition   . Hyperlipidemia   . Hypothyroidism   . Impacted cerumen, bilateral   . Jaw pain   . Localized edema   . Male erectile disorder   . Malignant neoplasm of overlapping sites of bladder ([place])   . Other constipation   . Other fatigue   . Overweight   . Pain in right finger(s)   . Pain in right lower leg   . Peptic ulcer disease   . Primary insomnia   . Renal stones   . Renal stones   . 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 Shingles 06/2012  . Shortness of breath   . Sick sinus syndrome ([place])   . Sleep apnea    dx 08/2015, CPAP  . Spontaneous ecchymoses   . Squamous cell carcinoma of skin    UNSPECIFIED  . Testicular hypofunction   . Type 2 diabetes mellitus ([place]) 1994   UNCONTROLLED, COMPLICATIONS INCLUDE NEPHROPATHY AND PERIPHERAL NEUROPATHY    Current Outpatient Medications:  .  amiodarone (PACERONE)  200 MG tablet, Take 200 mg by mouth daily. , Disp: , Rfl:  .  atorvastatin (LIPITOR) 10 MG tablet, Take 10 mg by mouth at bedtime., Disp: , Rfl:  .  carvedilol (COREG) 12.5 MG tablet, Take 1 tablet (12.5 mg total) by mouth 2 (two) times daily with a meal., Disp: 180 tablet, Rfl: 2 .  ciprofloxacin (CIPRO) 500 MG tablet, Take 500 mg by mouth 2 (two) times daily as needed., Disp: , Rfl:  .  gabapentin (NEURONTIN) 300 MG capsule, , Disp: , Rfl:  .  glipiZIDE (GLUCOTROL XL) 10 MG 24 hr tablet, Take 10 mg by mouth 2 (two) times daily with a meal., Disp: , Rfl:  .  levothyroxine (SYNTHROID, LEVOTHROID) 125 MCG tablet, Take 125 mcg by mouth daily before breakfast., Disp: , Rfl:  .  rivaroxaban (XARELTO) 20 MG TABS tablet, Take 20 mg by mouth daily with supper., Disp: , Rfl:  .  testosterone cypionate (DEPOTESTOSTERONE CYPIONATE) 200 MG/ML injection, INJECT 1 MILLILITER IN THE MUSCLE EVERY 2 WEEKS, Disp: , Rfl:   Social History   Tobacco Use  Smoking Status Never Smoker  Smokeless Tobacco Never Used    Allergies  Allergen Reactions  . Propranolol  Drops HR too low   . Clonidine Derivatives Nausea And Vomiting  . Hydralazine Hcl     Patient had an adverse reaction   . Invokana [Canagliflozin]     Patient had an adverse reaction  . Metformin And Related Diarrhea  . Other Diarrhea and Nausea And Vomiting  . Topamax [Topiramate]     Patient had an adverse reaction   Objective:   There were no vitals filed for this visit. There is no height or weight on file to calculate BMI. Constitutional Well developed. Well nourished.  Vascular Dorsalis pedis pulses palpable bilaterally. Posterior tibial pulses non-palpable bilaterally. Capillary refill normal to all digits.  No cyanosis or clubbing noted. Pedal hair growth absent.  Neurologic Normal speech. Oriented to person, place, and time. Protective sensation absent  Dermatologic Wound Location: Right submet 2 Wound Base:  Granular/Healthy Peri-wound: Calloused Exudate: None: wound tissue dry Wound Measurements: -0.2x0.4 post-debridement  Orthopedic: No pain to palpation either foot. Hammertoes bilat Pes cavus bilat   Radiographs: none Assessment:   1. Ulcer of right foot limited to breakdown of skin [facility])    Plan:  Patient was evaluated and treated and all questions answered.  Ulcer right submet 2 -Selective debridement today -Offload with padding -Apply ointment daily -F/u in 1 month for recheck  Procedure: Selective Debridement of Wound Rationale: Removal of devitalized tissue from the wound to promote healing.  Pre-Debridement Wound Measurements: 0.2 cm x 0.4 cm x 0.1 cm  Post-Debridement Wound Measurements: same as pre-debridement. Type of Debridement: sharp selective Tissue Removed: Devitalized soft-tissue Dressing: Dry, sterile, compression dressing. Disposition: Patient tolerated procedure well. Patient to return in 1 week for follow-up.        No follow-ups on file.

## 2019-06-11 ENCOUNTER — Ambulatory Visit (INDEPENDENT_AMBULATORY_CARE_PROVIDER_SITE_OTHER): Payer: Medicare Other | Admitting: *Deleted

## 2019-06-11 DIAGNOSIS — Z950 Presence of cardiac pacemaker: Secondary | ICD-10-CM

## 2019-06-12 LAB — CUP PACEART REMOTE DEVICE CHECK
Date Time Interrogation Session: 20201223191049
Implantable Lead Implant Date: 20180813
Implantable Lead Implant Date: 20180813
Implantable Lead Location: 753859
Implantable Lead Location: 753860
Implantable Lead Model: 377
Implantable Lead Model: 377
Implantable Lead Serial Number: 49893169
Implantable Lead Serial Number: 50011411
Implantable Pulse Generator Implant Date: 20180813
Pulse Gen Model: 407145
Pulse Gen Serial Number: 69158272

## 2019-06-16 ENCOUNTER — Ambulatory Visit: Payer: Medicare Other | Admitting: Podiatry

## 2019-06-30 DIAGNOSIS — Z6829 Body mass index (BMI) 29.0-29.9, adult: Secondary | ICD-10-CM | POA: Diagnosis not present

## 2019-06-30 DIAGNOSIS — G4733 Obstructive sleep apnea (adult) (pediatric): Secondary | ICD-10-CM | POA: Diagnosis not present

## 2019-06-30 DIAGNOSIS — I4891 Unspecified atrial fibrillation: Secondary | ICD-10-CM | POA: Diagnosis not present

## 2019-06-30 DIAGNOSIS — E034 Atrophy of thyroid (acquired): Secondary | ICD-10-CM | POA: Diagnosis not present

## 2019-06-30 DIAGNOSIS — E1121 Type 2 diabetes mellitus with diabetic nephropathy: Secondary | ICD-10-CM | POA: Diagnosis not present

## 2019-06-30 DIAGNOSIS — E782 Mixed hyperlipidemia: Secondary | ICD-10-CM | POA: Diagnosis not present

## 2019-06-30 DIAGNOSIS — E663 Overweight: Secondary | ICD-10-CM | POA: Diagnosis not present

## 2019-06-30 DIAGNOSIS — U071 COVID-19: Secondary | ICD-10-CM | POA: Diagnosis not present

## 2019-07-01 DIAGNOSIS — E782 Mixed hyperlipidemia: Secondary | ICD-10-CM | POA: Diagnosis not present

## 2019-07-01 DIAGNOSIS — E1121 Type 2 diabetes mellitus with diabetic nephropathy: Secondary | ICD-10-CM | POA: Diagnosis not present

## 2019-07-01 DIAGNOSIS — E034 Atrophy of thyroid (acquired): Secondary | ICD-10-CM | POA: Diagnosis not present

## 2019-07-07 DIAGNOSIS — L570 Actinic keratosis: Secondary | ICD-10-CM | POA: Diagnosis not present

## 2019-07-07 DIAGNOSIS — C44622 Squamous cell carcinoma of skin of right upper limb, including shoulder: Secondary | ICD-10-CM | POA: Diagnosis not present

## 2019-07-08 DIAGNOSIS — M21371 Foot drop, right foot: Secondary | ICD-10-CM | POA: Diagnosis not present

## 2019-07-08 DIAGNOSIS — M25512 Pain in left shoulder: Secondary | ICD-10-CM | POA: Diagnosis not present

## 2019-07-08 DIAGNOSIS — M7542 Impingement syndrome of left shoulder: Secondary | ICD-10-CM | POA: Diagnosis not present

## 2019-07-15 DIAGNOSIS — R799 Abnormal finding of blood chemistry, unspecified: Secondary | ICD-10-CM | POA: Diagnosis not present

## 2019-07-17 DIAGNOSIS — H25812 Combined forms of age-related cataract, left eye: Secondary | ICD-10-CM | POA: Diagnosis not present

## 2019-07-17 DIAGNOSIS — Z961 Presence of intraocular lens: Secondary | ICD-10-CM | POA: Diagnosis not present

## 2019-07-17 DIAGNOSIS — E113599 Type 2 diabetes mellitus with proliferative diabetic retinopathy without macular edema, unspecified eye: Secondary | ICD-10-CM | POA: Diagnosis not present

## 2019-07-18 DIAGNOSIS — N401 Enlarged prostate with lower urinary tract symptoms: Secondary | ICD-10-CM | POA: Diagnosis not present

## 2019-07-18 DIAGNOSIS — Z8551 Personal history of malignant neoplasm of bladder: Secondary | ICD-10-CM | POA: Diagnosis not present

## 2019-08-12 ENCOUNTER — Other Ambulatory Visit: Payer: Self-pay | Admitting: Family Medicine

## 2019-09-08 ENCOUNTER — Ambulatory Visit (INDEPENDENT_AMBULATORY_CARE_PROVIDER_SITE_OTHER): Payer: Medicare Other | Admitting: Podiatry

## 2019-09-08 ENCOUNTER — Other Ambulatory Visit: Payer: Self-pay

## 2019-09-08 DIAGNOSIS — L97511 Non-pressure chronic ulcer of other part of right foot limited to breakdown of skin: Secondary | ICD-10-CM

## 2019-09-08 DIAGNOSIS — L89611 Pressure ulcer of right heel, stage 1: Secondary | ICD-10-CM | POA: Diagnosis not present

## 2019-09-08 MED ORDER — DOXYCYCLINE HYCLATE 50 MG PO CAPS: 50.0000 mg | ORAL_CAPSULE | Freq: Two times a day (BID) | ORAL | 0 refills | Status: DC

## 2019-09-08 NOTE — Progress Notes (Signed)
  Subjective:  Patient ID: Paul Kent, male    DOB: 1940/10/10,  MRN: [id number]  Chief Complaint  Patient presents with  . foot lesion    Rt back heel lesion x 2 wks; pain with walking -w/ redness -pt deneis drainage/swellgin -rubbed gains a brace Tx: bandaid and neosprin   . Diabetes    FBS: 160 a1C: 6.2    79 y.o. male presents for wound care. Hx confirmed with patient. Describes new wound caused by dropfoot brace. Objective:  Physical Exam: Wound Location: Right submet 2 Wound Measurement: 0.4x0.2 post-debridement Wound Base: Granular/Healthy Peri-wound: callused Exudate: None: wound tissue dry wound without warmth, erythema, signs of acute infection  Wound Location: Right heel Wound Base: Granular/Healthy Peri-wound: Reddened Exudate: None: wound tissue dry + tenderness     Assessment:   1. Pressure injury of right heel, stage 1   2. Ulcer of right foot limited to breakdown of skin [facility])     Plan:  Patient was evaluated and treated and all questions answered.  Ulcer Right Heel -Rx Doxycycline for mild periwound cellulitis -No debridement of wound today  Right 2nd MPJ wound -Debrided, only small open area -Continue abx ointment and band-aid  Procedure: Selective Debridement of Wound Rationale: Removal of devitalized tissue from the wound to promote healing.  Pre-Debridement Wound Measurements: 0.3 cm x 0.2 cm x 0.1 cm  Post-Debridement Wound Measurements: same as pre-debridement. Type of Debridement: sharp selective Tissue Removed: Devitalized soft-tissue Dressing: Dry, sterile, compression dressing. Disposition: Patient tolerated procedure well. Patient to return in 1 week for follow-up.    Return in about 1 week (around 09/15/2019) for Wound Care.

## 2019-09-10 ENCOUNTER — Ambulatory Visit (INDEPENDENT_AMBULATORY_CARE_PROVIDER_SITE_OTHER): Payer: Medicare Other | Admitting: *Deleted

## 2019-09-10 DIAGNOSIS — Z950 Presence of cardiac pacemaker: Secondary | ICD-10-CM | POA: Diagnosis not present

## 2019-09-10 LAB — CUP PACEART REMOTE DEVICE CHECK
Date Time Interrogation Session: 20210324110754
Implantable Lead Implant Date: 20180813
Implantable Lead Implant Date: 20180813
Implantable Lead Location: 753859
Implantable Lead Location: 753860
Implantable Lead Model: 377
Implantable Lead Model: 377
Implantable Lead Serial Number: 49893169
Implantable Lead Serial Number: 50011411
Implantable Pulse Generator Implant Date: 20180813
Pulse Gen Model: 407145
Pulse Gen Serial Number: 69158272

## 2019-09-10 NOTE — Progress Notes (Signed)
PPM Remote  

## 2019-09-16 ENCOUNTER — Other Ambulatory Visit: Payer: Self-pay

## 2019-09-16 ENCOUNTER — Ambulatory Visit (INDEPENDENT_AMBULATORY_CARE_PROVIDER_SITE_OTHER): Payer: Medicare Other | Admitting: Podiatry

## 2019-09-16 DIAGNOSIS — M21371 Foot drop, right foot: Secondary | ICD-10-CM

## 2019-09-16 DIAGNOSIS — L89611 Pressure ulcer of right heel, stage 1: Secondary | ICD-10-CM | POA: Diagnosis not present

## 2019-09-16 NOTE — Progress Notes (Signed)
  Subjective:  Patient ID: Paul Kent, male    DOB: April 09, 1941,  MRN: [id number]  Chief Complaint  Patient presents with  . Wound Check    F/U Rt heel and Rt 2nd MPJ wound checK pt. states,' I believe it's a little better." tx: doxycycline and neosporin -pt deneis redness/swellgin -w/ drianage  . Diabetes    FBS: 30    79 y.o. male presents for wound care. Hx confirmed with patient.   Objective:  Physical Exam:  Wound Location: Right heel Wound Base: Granular/Healthy measuring 3x2 Peri-wound: intact Exudate: None: wound tissue dry + tenderness  DF Right foot 3/5 Assessment:   1. Pressure injury of right heel, stage 1   2. Right foot drop     Plan:  Patient was evaluated and treated and all questions answered.  Ulcer Right Heel -Wound improving. Selective debridement today. Silvadene and mepilex applied. -No further abx indicated. No signs of infection -Advised to call promptly for worsening. Discussed signs of infection.  Procedure: Selective Debridement of Wound Rationale: Removal of devitalized tissue from the wound to promote healing.  Pre-Debridement Wound Measurements: 3 cm x 2 cm x 0.2 cm  Post-Debridement Wound Measurements: same as pre-debridement. Type of Debridement: sharp selective Tissue Removed: Devitalized soft-tissue Dressing: Dry, sterile, compression dressing. Disposition: Patient tolerated procedure well. Patient to return in 1 week for follow-up.    Dropfoot -Discussed with patient that, though weak, he does not have a true dropfoot. Discussed possible therapy but he does not wish to pursue as he has completed. Discussed further exercises for strengthening his extensors.  No follow-ups on file.

## 2019-09-21 NOTE — Progress Notes (Signed)
Electrophysiology Office Note   Date:  09/22/2019   ID:  Paul Kent, Paul Kent Jun 23, 1940, MRN [id number]  PCP:  [name], MD  Cardiologist:  Primary Electrophysiologist:  Jeanetta Alonzo [name], MD    No chief complaint on file.    History of Present Illness: Paul Kent is a 79 y.o. male who is being seen today for the evaluation of atrial fibrillation at the request of Cox, Kirsten, MD. Presenting today for electrophysiology evaluation.  He has a history significant for chronic atrial fibrillation, hypertension, hyperlipidemia, sleep apnea, type 2 diabetes, sick sinus syndrome status post Biotronik pacemaker.  He had an atrial fibrillation ablation 06/2017.  He was thus switched from flecainide to amiodarone.  His main symptoms are weakness, fatigue.  He felt better the few months after his ablation in sinus rhythm.  He did not have these episodes.  Today, denies symptoms of palpitations, chest pain, shortness of breath, orthopnea, PND, lower extremity edema, claudication, dizziness, presyncope, syncope, bleeding, or neurologic sequela. The patient is tolerating medications without difficulties.  Overall he is doing well.  He has no chest pain or shortness of breath.  He is able to do all of his daily activities.  He does get a little dizzy at times when he is in the gym and stands up from a machine.  Aside from that he has no major complaints at this time.    Past Medical History:  Diagnosis Date  . Acquired bilateral hammer toes   . Arthralgia of left temporomandibular joint   . Atrial fibrillation ([place]) 05/2016  . Bladder cancer ([place])   . Bradycardia    LOW HEART RATE  . Calculus of ureter   . Cellulitis of right lower limb   . CHF (congestive heart failure) ([place]) 02/2018  . Chronic atrial fibrillation ([place])   . Corns and callosities   . Disturbances of salivary secretion   . Epistaxis   . Essential hypertension   . Essential tremor   . H pylori ulcer   . Hesitancy  of micturition   . Hyperlipidemia   . Hypothyroidism   . Impacted cerumen, bilateral   . Jaw pain   . Localized edema   . Male erectile disorder   . Malignant neoplasm of overlapping sites of bladder ([place])   . Other constipation   . Other fatigue   . Overweight   . Pain in right finger(s)   . Pain in right lower leg   . Peptic ulcer disease   . Primary insomnia   . Renal stones   . Renal stones   . 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 Shingles 06/2012  . Shortness of breath   . Sick sinus syndrome ([place])   . Sleep apnea    dx 08/2015, CPAP  . Spontaneous ecchymoses   . Squamous cell carcinoma of skin    UNSPECIFIED  . Testicular hypofunction   . Type 2 diabetes mellitus ([place]) 1994   UNCONTROLLED, COMPLICATIONS INCLUDE NEPHROPATHY AND PERIPHERAL NEUROPATHY   Past Surgical History:  Procedure Laterality Date  . ABLATION  06/2017  . CARDIOVERSION N/A 03/22/2018   Procedure: CARDIOVERSION;  Surgeon: Buford Dresser, MD;  [facility] END[facility]COPY;  Service: Cardiovascular;  Laterality: N/A;  . HEMIARTHROPLASTY HIP  10/31/2013   IT HIP BIPOLAR  . LUMBAR SPINE SURGERY    . PACEMAKER PLACEMENT  01/29/2017  . SHOULDER SURGERY Right      Current  Outpatient Medications  Medication Sig Dispense Refill  . amiodarone (PACERONE) 200 MG tablet Take 200 mg by mouth daily.     Marland Kitchen atorvastatin (LIPITOR) 10 MG tablet Take 10 mg by mouth at bedtime.    . carvedilol (COREG) 12.5 MG tablet Take 6.26 mg by mouth 2 (two) times daily with a meal.    . gabapentin (NEURONTIN) 300 MG capsule Take 300 mg by mouth daily.     Marland Kitchen glipiZIDE (GLUCOTROL XL) 10 MG 24 hr tablet Take 10 mg by mouth 2 (two) times daily with a meal.    . levothyroxine (SYNTHROID) 137 MCG tablet Take 137 mcg by mouth every morning.    . testosterone cypionate (DEPOTESTOSTERONE CYPIONATE) 200 MG/ML injection INJECT 1 MILLILITER IN THE MUSCLE EVERY 2 WEEKS    .  XARELTO 20 MG TABS tablet TAKE ONE TABLET BY MOUTH EVERY EVENING 90 tablet 1   No current facility-administered medications for this visit.    Allergies:   Propranolol, Clonidine derivatives, Hydralazine hcl, Invokana [canagliflozin], Metformin and related, Other, and Topamax [topiramate]   Social History:  The patient  reports that he has never smoked. He has never used smokeless tobacco. He reports current alcohol use. He reports that he does not use drugs.   Family History:  The patient's family history includes Arthritis in an other family member; Congestive Heart Failure in an other family member; Diabetes type II in an other family member; Heart Problems in his father; Heart attack in his mother; Hyperlipidemia in an other family member; Transient ischemic attack in his mother; Tuberculosis in his brother.    ROS:  Please see the history of present illness.   Otherwise, review of systems is positive for none.   All other systems are reviewed and negative.   PHYSICAL EXAM: VS:  BP 116/66   Pulse 66   Ht '6\' 3"'  (1.905 m)   Wt 237 lb (107.5 kg)   SpO2 96%   BMI 29.62 kg/m  , BMI Body mass index is 29.62 kg/m. GEN: Well nourished, well developed, in no acute distress  HEENT: normal  Neck: no JVD, carotid bruits, or masses Cardiac: RRR; no murmurs, rubs, or gallops,no edema  Respiratory:  clear to auscultation bilaterally, normal work of breathing GI: soft, nontender, nondistended, + BS MS: no deformity or atrophy  Skin: warm and dry, device site well healed Neuro:  Strength and sensation are intact Psych: euthymic mood, full affect  EKG:  EKG is ordered today. Personal review of the ekg ordered shows AV paced, rate 68  Personal review of the device interrogation today. Results in [place]: No results found for requested labs within last 8760 hours.    Lipid Panel  No results found for: CHOL, TRIG, HDL, CHOLHDL, VLDL, LDLCALC, LDLDIRECT   Wt Readings from  Last 3 Encounters:  09/22/19 237 lb (107.5 kg)  02/04/19 235 lb (106.6 kg)  01/27/19 238 lb (108 kg)      Other studies Reviewed: Additional studies/ records that were reviewed today include: TTE 03/01/18  Review of the above records today demonstrates:  - Left ventricle: The cavity size was normal. Wall thickness was   increased in a pattern of mild LVH. Systolic function was mildly   reduced. The estimated ejection fraction was in the range of 45%   to 50%. Diffuse hypokinesis. - Left atrium: The atrium was mildly dilated. Volume/bsa, S: 37.6   ml/m^2. - Right ventricle: The cavity size was moderately  dilated. Wall   thickness was normal. Pacer wire or catheter noted in right   ventricle.   ASSESSMENT AND PLAN:  1.  Paroxysmal atrial fibrillation: Currently on Xarelto and amiodarone.  Status post cryoablation 06/20/2017.  Unfortunately has had more atrial fibrillation.  CHA2DS2-VASc of 4.  He has had minimal atrial fibrillation since last check.  No changes.  We Paul Kent check both amiodarone and anticoagulation labs today.   2.  Hypertension: Currently well controlled  3.  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  Device functioning appropriately.  No changes.    4.  Obstructive sleep apnea: CPAP compliance encouraged  5.  Hypothyroidism: Continue Synthroid  6.  Morbid obesity: Weight loss with diet and exercise encouraged  7.  Hyperlipidemia: Continue Lipitor   Current medicines are reviewed at length with the patient today.   The patient does not have concerns regarding his medicines.  The following changes were made today: None  Labs/ tests ordered today include:  Orders Placed This Encounter  Procedures  . TSH  . Comp Met (CMET)  . CBC  . EKG 12-Lead   Disposition:   FU with Paul Kent 6 months  Signed, Ayeshia Coppin [name], MD  09/22/2019 11:27 AM     [facility] [address] [place]  [address] [phone] (office) [phone] (fax)

## 2019-09-22 ENCOUNTER — Ambulatory Visit (INDEPENDENT_AMBULATORY_CARE_PROVIDER_SITE_OTHER): Payer: Medicare Other | Admitting: Cardiology

## 2019-09-22 ENCOUNTER — Encounter: Payer: Self-pay | Admitting: Cardiology

## 2019-09-22 ENCOUNTER — Other Ambulatory Visit: Payer: Self-pay

## 2019-09-22 VITALS — BP 116/66 | HR 66 | Ht 75.0 in | Wt 237.0 lb

## 2019-09-22 DIAGNOSIS — Z79899 Other long term (current) drug therapy: Secondary | ICD-10-CM | POA: Diagnosis not present

## 2019-09-22 DIAGNOSIS — I4819 Other persistent atrial fibrillation: Secondary | ICD-10-CM

## 2019-09-22 DIAGNOSIS — I495 Sick sinus syndrome: Secondary | ICD-10-CM

## 2019-09-22 DIAGNOSIS — Z950 Presence of cardiac pacemaker: Secondary | ICD-10-CM | POA: Diagnosis not present

## 2019-09-22 LAB — CUP PACEART INCLINIC DEVICE CHECK
Brady Statistic RA Percent Paced: 79 %
Brady Statistic RV Percent Paced: 98 %
Date Time Interrogation Session: 20210405131429
Implantable Lead Implant Date: 20180813
Implantable Lead Implant Date: 20180813
Implantable Lead Location: 753859
Implantable Lead Location: 753860
Implantable Lead Model: 377
Implantable Lead Model: 377
Implantable Lead Serial Number: 49893169
Implantable Lead Serial Number: 50011411
Implantable Pulse Generator Implant Date: 20180813
Lead Channel Impedance Value: 487 Ohm
Lead Channel Impedance Value: 526 Ohm
Lead Channel Pacing Threshold Amplitude: 0.8 V
Lead Channel Pacing Threshold Amplitude: 1.2 V
Lead Channel Pacing Threshold Pulse Width: 0.4 ms
Lead Channel Pacing Threshold Pulse Width: 0.4 ms
Lead Channel Sensing Intrinsic Amplitude: 10.9 mV
Lead Channel Sensing Intrinsic Amplitude: 2.2 mV
Lead Channel Setting Pacing Amplitude: 2.4 V
Lead Channel Setting Pacing Amplitude: 2.4 V
Lead Channel Setting Pacing Pulse Width: 0.4 ms
Pulse Gen Model: 407145
Pulse Gen Serial Number: 69158272

## 2019-09-22 NOTE — Patient Instructions (Signed)
Medication Instructions:  Your physician recommends that you continue on your current medications as directed. Please refer to the Current Medication list given to you today.  *If you need a refill on your cardiac medications before your next appointment, please call your pharmacy*   Lab Work: Amiodarone survelliance lab work today: TSH , CMET & CBC If you have labs (blood work) drawn today and your tests are completely normal, you will receive your results only by: Marland Kitchen MyChart Message (if you have MyChart) OR . A paper copy in the mail If you have any lab test that is abnormal or we need to change your treatment, we will call you to review the results.   Testing/Procedures: None ordered   Follow-Up: Remote monitoring is used to monitor your Pacemaker of ICD from home. This monitoring reduces the number of office visits required to check your device to one time per year. It allows [place] to keep an eye on the functioning of your device to ensure it is working properly. You are scheduled for a device check from home on 12/10/2019. You may send your transmission at any time that day. If you have a wireless device, the transmission will be sent automatically. After your physician reviews your transmission, you will receive a postcard with your next transmission date.  At [facility], you and your health needs are our priority.  As part of our continuing mission to provide you with exceptional heart care, we have created designated Provider Care Teams.  These Care Teams include your primary Cardiologist (physician) and Advanced Practice Providers (APPs -  Physician Assistants and Nurse Practitioners) who all work together to provide you with the care you need, when you need it.  We recommend signing up for the patient portal called "MyChart".  Sign up information is provided on this After Visit Summary.  MyChart is used to connect with patients for Virtual Visits (Telemedicine).  Patients are able to  view lab/test results, encounter notes, upcoming appointments, etc.  Non-urgent messages can be sent to your provider as well.   To learn more about what you can do with MyChart, go to [email or web address].    Your next appointment:   6 month(s)  The format for your next appointment:   In Person  Provider:   Allegra Lai, MD   Thank you for choosing [facility]!!   Trinidad Curet, RN [phone]    Other Instructions

## 2019-09-23 LAB — CBC
Hematocrit: 43.2 % (ref 37.5–51.0)
Hemoglobin: 14.3 g/dL (ref 13.0–17.7)
MCH: 33.2 pg — ABNORMAL HIGH (ref 26.6–33.0)
MCHC: 33.1 g/dL (ref 31.5–35.7)
MCV: 100 fL — ABNORMAL HIGH (ref 79–97)
Platelets: 140 10*3/uL — ABNORMAL LOW (ref 150–450)
RBC: 4.31 x10E6/uL (ref 4.14–5.80)
RDW: 13 % (ref 11.6–15.4)
WBC: 5.1 10*3/uL (ref 3.4–10.8)

## 2019-09-23 LAB — COMPREHENSIVE METABOLIC PANEL
ALT: 21 IU/L (ref 0–44)
AST: 21 IU/L (ref 0–40)
Albumin/Globulin Ratio: 1.7 (ref 1.2–2.2)
Albumin: 3.8 g/dL (ref 3.7–4.7)
Alkaline Phosphatase: 82 IU/L (ref 39–117)
BUN/Creatinine Ratio: 16 (ref 10–24)
BUN: 22 mg/dL (ref 8–27)
Bilirubin Total: 0.5 mg/dL (ref 0.0–1.2)
CO2: 26 mmol/L (ref 20–29)
Calcium: 8.7 mg/dL (ref 8.6–10.2)
Chloride: 105 mmol/L (ref 96–106)
Creatinine, Ser: 1.39 mg/dL — ABNORMAL HIGH (ref 0.76–1.27)
GFR calc Af Amer: 55 mL/min/{1.73_m2} — ABNORMAL LOW (ref >59)
GFR calc non Af Amer: 48 mL/min/{1.73_m2} — ABNORMAL LOW (ref >59)
Globulin, Total: 2.2 g/dL (ref 1.5–4.5)
Glucose: 207 mg/dL — ABNORMAL HIGH (ref 65–99)
Potassium: 5.2 mmol/L (ref 3.5–5.2)
Sodium: 141 mmol/L (ref 134–144)
Total Protein: 6 g/dL (ref 6.0–8.5)

## 2019-09-23 LAB — TSH: TSH: 3.18 u[IU]/mL (ref 0.450–4.500)

## 2019-09-30 ENCOUNTER — Ambulatory Visit (INDEPENDENT_AMBULATORY_CARE_PROVIDER_SITE_OTHER): Payer: Medicare Other | Admitting: Podiatry

## 2019-09-30 ENCOUNTER — Other Ambulatory Visit: Payer: Self-pay

## 2019-09-30 DIAGNOSIS — L89611 Pressure ulcer of right heel, stage 1: Secondary | ICD-10-CM

## 2019-10-09 ENCOUNTER — Other Ambulatory Visit: Payer: Self-pay | Admitting: Family Medicine

## 2019-10-14 ENCOUNTER — Other Ambulatory Visit: Payer: Self-pay | Admitting: Family Medicine

## 2019-11-04 ENCOUNTER — Ambulatory Visit: Payer: Medicare Other | Admitting: Podiatry

## 2019-11-27 NOTE — Progress Notes (Signed)
Acute Office Visit  Subjective:    Patient ID: Paul Kent, male    DOB: 02/01/1941, 79 y.o.   MRN: [id number]  Chief Complaint  Patient presents with  . finger pain    Rt third finger, patient states he has a trigger finger and would like to have a injection    HPI Patient is in today for right 3rd digit trigger finger injection. He has had issues for a couple months with pain and triggering.  Past Medical History:  Diagnosis Date  . Acquired bilateral hammer toes   . Arthralgia of left temporomandibular joint   . Atrial fibrillation ([place]) 05/2016  . Bladder cancer ([place])   . Bradycardia    LOW HEART RATE  . Calculus of ureter   . Cellulitis of right lower limb   . CHF (congestive heart failure) ([place]) 02/2018  . Chronic atrial fibrillation ([place])   . Corns and callosities   . Disturbances of salivary secretion   . Epistaxis   . Essential hypertension   . Essential tremor   . H pylori ulcer   . Hesitancy of micturition   . Hyperlipidemia   . Hypothyroidism   . Impacted cerumen, bilateral   . Jaw pain   . Localized edema   . Male erectile disorder   . Malignant neoplasm of overlapping sites of bladder ([place])   . Other constipation   . Other fatigue   . Overweight   . Pain in right finger(s)   . Pain in right lower leg   . Peptic ulcer disease   . Primary insomnia   . Renal stones   . Renal stones   . 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 Shingles 06/2012  . Shortness of breath   . Sick sinus syndrome ([place])   . Sleep apnea    dx 08/2015, CPAP  . Spontaneous ecchymoses   . Squamous cell carcinoma of skin    UNSPECIFIED  . Testicular hypofunction   . Type 2 diabetes mellitus ([place]) 1994   UNCONTROLLED, COMPLICATIONS INCLUDE NEPHROPATHY AND PERIPHERAL NEUROPATHY    Past Surgical History:  Procedure Laterality Date  . ABLATION  06/2017  . CARDIOVERSION N/A 03/22/2018   Procedure:  CARDIOVERSION;  Surgeon: Buford Dresser, MD;  Location: [facility] ENDOSCOPY;  Service: Cardiovascular;  Laterality: N/A;  . HEMIARTHROPLASTY HIP  10/31/2013   IT HIP BIPOLAR  . LUMBAR SPINE SURGERY    . PACEMAKER PLACEMENT  01/29/2017  . SHOULDER SURGERY Right     Family History  Problem Relation Age of Onset  . Heart attack Mother   . Transient ischemic attack Mother   . Heart Problems Father   . Tuberculosis Brother   . Diabetes type II Other   . Hyperlipidemia Other   . Congestive Heart Failure Other   . Arthritis Other     Social History   Socioeconomic History  . Marital status: Married    Spouse name: [name]  . Number of children: 3  . Years of education: Not on file  . Highest education level: Not on file  Occupational History  . Occupation: RETIRED    Comment: school principal  Tobacco Use  . Smoking status: Never Smoker  . Smokeless tobacco: Never Used  Vaping Use  . Vaping Use: Never used  Substance and Sexual Activity  . Alcohol use: Yes  . Drug use: Never  . Sexual activity: Not on file  Other Topics  Concern  . Not on file  Social History Narrative   Lives with wife   Social Determinants of Health   Financial Resource Strain:   . Difficulty of Paying Living Expenses:   Food Insecurity:   . Worried About Charity fundraiser in the Last Year:   . Arboriculturist in the Last Year:   Transportation Needs:   . Film/video editor (Medical):   Marland Kitchen Lack of Transportation (Non-Medical):   Physical Activity:   . Days of Exercise per Week:   . Minutes of Exercise per Session:   Stress:   . Feeling of Stress :   Social Connections:   . Frequency of Communication with Friends and Family:   . Frequency of Social Gatherings with Friends and Family:   . Attends Religious Services:   . Active Member of Clubs or Organizations:   . Attends Archivist Meetings:   Marland Kitchen Marital Status:   Intimate Partner Violence:   . Fear of Current or Ex-Partner:     . Emotionally Abused:   Marland Kitchen Physically Abused:   . Sexually Abused:     Outpatient Medications Prior to Visit  Medication Sig Dispense Refill  . amiodarone (PACERONE) 200 MG tablet Take 200 mg by mouth daily.     Marland Kitchen atorvastatin (LIPITOR) 10 MG tablet TAKE ONE TABLET BY MOUTH EVERY EVENING 90 tablet 0  . carvedilol (COREG) 12.5 MG tablet Take 6.26 mg by mouth 2 (two) times daily with a meal.    . gabapentin (NEURONTIN) 300 MG capsule Take 300 mg by mouth daily.     Marland Kitchen glipiZIDE (GLUCOTROL XL) 10 MG 24 hr tablet Take 1 tablet (10 mg total) by mouth 2 (two) times daily. Please call for fasting visit in May or June. Thanks, Dr. Tobie Poet 180 tablet 1  . levothyroxine (SYNTHROID) 137 MCG tablet Take 137 mcg by mouth every morning.    . testosterone cypionate (DEPOTESTOSTERONE CYPIONATE) 200 MG/ML injection INJECT 1 MILLILITER IN THE MUSCLE EVERY 2 WEEKS 10 mL 1  . XARELTO 20 MG TABS tablet TAKE ONE TABLET BY MOUTH EVERY EVENING 90 tablet 1   No facility-administered medications prior to visit.    Allergies  Allergen Reactions  . Propranolol     Drops HR too low   . Clonidine Derivatives Nausea And Vomiting  . Hydralazine Hcl     Patient had an adverse reaction   . Invokana [Canagliflozin]     Patient had an adverse reaction  . Metformin And Related Diarrhea  . Other Diarrhea and Nausea And Vomiting  . Topamax [Topiramate]     Patient had an adverse reaction    Review of Systems  Constitutional: Negative for chills and fever.  HENT: Negative for congestion and sore throat.   Respiratory: Negative for shortness of breath.   Cardiovascular: Negative for chest pain.  Musculoskeletal:       Rt third finger sometimes have difficulty bending or extending finger.       Objective:    Physical Exam Vitals reviewed.  Constitutional:      Appearance: Normal appearance.  Abdominal:     General: Bowel sounds are normal.  Musculoskeletal:     Comments: 3rd digit of right hand normal now,  but frequently triggers per patient.   Neurological:     Mental Status: He is alert.  Psychiatric:        Mood and Affect: Mood normal.        Behavior: Behavior  normal.     BP 130/82   Pulse 79   Temp (!) 97.4 F (36.3 C)   Ht 6\' 3"  (1.905 m)   Wt 236 lb (107 kg)   SpO2 99%   BMI 29.50 kg/m  Wt Readings from Last 3 Encounters:  11/28/19 236 lb (107 kg)  09/22/19 237 lb (107.5 kg)  02/04/19 235 lb (106.6 kg)    Health Maintenance Due  Topic Date Due  . HEMOGLOBIN A1C  Never done  . FOOT EXAM  Never done  . OPHTHALMOLOGY EXAM  Never done  . URINE MICROALBUMIN  Never done  . COVID-19 Vaccine (1) Never done  . TETANUS/TDAP  Never done  . PNA vac Low Risk Adult (2 of 2 - PCV13) 06/20/2015    There are no preventive care reminders to display for this patient.   Lab Results  Component Value Date   TSH 3.180 09/22/2019   Lab Results  Component Value Date   WBC 5.1 09/22/2019   HGB 14.3 09/22/2019   HCT 43.2 09/22/2019   MCV 100 (H) 09/22/2019   PLT 140 (L) 09/22/2019   Lab Results  Component Value Date   NA 141 09/22/2019   K 5.2 09/22/2019   CO2 26 09/22/2019   GLUCOSE 207 (H) 09/22/2019   BUN 22 09/22/2019   CREATININE 1.39 (H) 09/22/2019   BILITOT 0.5 09/22/2019   ALKPHOS 82 09/22/2019   AST 21 09/22/2019   ALT 21 09/22/2019   PROT 6.0 09/22/2019   ALBUMIN 3.8 09/22/2019   CALCIUM 8.7 09/22/2019   No results found for: CHOL No results found for: HDL No results found for: LDLCALC No results found for: TRIG No results found for: CHOLHDL No results found for: HGBA1C     Assessment & Plan:  1. Trigger middle finger of right hand - triamcinolone acetonide (KENALOG-40) injection 20 mg  Risks were discussed including bleeding, infection, increase in sugars if diabetic, atrophy at site of injection, and increased pain.  After consent was obtained, using sterile technique the right palm was prepped with alcohol.    Kenalog 20 mg and 0.5 ml plain  Lidocaine was then injected and the needle withdrawn.  The procedure was well tolerated.   It may be more painful for the first 1-2 days.  Watch for fever, or increased swelling or persistent pain in the joint. Call or return to clinic prn if such symptoms occur or there is failure to improve as anticipated.    Follow-up: Return in about 8 weeks (around 01/26/2020) for fasting.  An After Visit Summary was printed and given to the patient.  [name] [facility] [phone]

## 2019-11-28 ENCOUNTER — Encounter: Payer: Self-pay | Admitting: Family Medicine

## 2019-11-28 ENCOUNTER — Other Ambulatory Visit: Payer: Self-pay

## 2019-11-28 ENCOUNTER — Ambulatory Visit (INDEPENDENT_AMBULATORY_CARE_PROVIDER_SITE_OTHER): Payer: Medicare Other | Admitting: Family Medicine

## 2019-11-28 VITALS — BP 130/82 | HR 79 | Temp 97.4°F | Ht 75.0 in | Wt 236.0 lb

## 2019-11-28 DIAGNOSIS — M65331 Trigger finger, right middle finger: Secondary | ICD-10-CM | POA: Insufficient documentation

## 2019-11-28 HISTORY — DX: Trigger finger, right middle finger: M65.331

## 2019-11-28 MED ORDER — TRIAMCINOLONE ACETONIDE 40 MG/ML IJ SUSP: 20.0000 mg | Freq: Once | INTRAMUSCULAR | Status: DC

## 2019-12-08 ENCOUNTER — Other Ambulatory Visit: Payer: Self-pay | Admitting: Family Medicine

## 2019-12-10 ENCOUNTER — Ambulatory Visit (INDEPENDENT_AMBULATORY_CARE_PROVIDER_SITE_OTHER): Payer: Medicare Other | Admitting: *Deleted

## 2019-12-10 DIAGNOSIS — I480 Paroxysmal atrial fibrillation: Secondary | ICD-10-CM

## 2019-12-10 LAB — CUP PACEART REMOTE DEVICE CHECK
Date Time Interrogation Session: 20210623120707
Implantable Lead Implant Date: 20180813
Implantable Lead Implant Date: 20180813
Implantable Lead Location: 753859
Implantable Lead Location: 753860
Implantable Lead Model: 377
Implantable Lead Model: 377
Implantable Lead Serial Number: 49893169
Implantable Lead Serial Number: 50011411
Implantable Pulse Generator Implant Date: 20180813
Pulse Gen Model: 407145
Pulse Gen Serial Number: 69158272

## 2019-12-11 NOTE — Progress Notes (Signed)
Remote pacemaker transmission.   

## 2019-12-15 NOTE — Progress Notes (Signed)
  Subjective:  Patient ID: Paul Kent, male    DOB: 04/04/1941,  MRN: [id number]  Chief Complaint  Patient presents with  . Foot Ulcer    F/U Lt heel ulcer Pt. states," I think and it feels better." -pt denies pain/redness/swelling -w/ little driange Tx: neosporin -pt dneis N/V/F?Ch   . Diabetes    FBS: 130 A1C: 6    79 y.o. male presents for wound care. Hx confirmed with patient.   Objective:  Physical Exam:  Wound Location: Right heel Wound Base: epithelialized Peri-wound: intact Exudate: None: wound tissue dry + tenderness  DF Right foot 3/5 Assessment:   1. Pressure injury of right heel, stage 1     Plan:  Patient was evaluated and treated and all questions answered.  Ulcer Right Heel -Wound improving. Minimal debrided. Transition out of shoe. F/u should issues worsen.    No follow-ups on file.

## 2020-01-05 ENCOUNTER — Other Ambulatory Visit: Payer: Self-pay | Admitting: Family Medicine

## 2020-01-05 DIAGNOSIS — L570 Actinic keratosis: Secondary | ICD-10-CM | POA: Diagnosis not present

## 2020-01-05 DIAGNOSIS — C44622 Squamous cell carcinoma of skin of right upper limb, including shoulder: Secondary | ICD-10-CM | POA: Diagnosis not present

## 2020-01-05 DIAGNOSIS — L578 Other skin changes due to chronic exposure to nonionizing radiation: Secondary | ICD-10-CM | POA: Diagnosis not present

## 2020-01-07 DIAGNOSIS — E113293 Type 2 diabetes mellitus with mild nonproliferative diabetic retinopathy without macular edema, bilateral: Secondary | ICD-10-CM | POA: Diagnosis not present

## 2020-01-07 DIAGNOSIS — Z961 Presence of intraocular lens: Secondary | ICD-10-CM | POA: Diagnosis not present

## 2020-01-07 DIAGNOSIS — H25812 Combined forms of age-related cataract, left eye: Secondary | ICD-10-CM | POA: Diagnosis not present

## 2020-01-14 DIAGNOSIS — Z8551 Personal history of malignant neoplasm of bladder: Secondary | ICD-10-CM | POA: Diagnosis not present

## 2020-01-14 DIAGNOSIS — N401 Enlarged prostate with lower urinary tract symptoms: Secondary | ICD-10-CM | POA: Diagnosis not present

## 2020-01-14 DIAGNOSIS — R972 Elevated prostate specific antigen [PSA]: Secondary | ICD-10-CM | POA: Diagnosis not present

## 2020-01-15 DIAGNOSIS — G4733 Obstructive sleep apnea (adult) (pediatric): Secondary | ICD-10-CM | POA: Diagnosis not present

## 2020-01-15 DIAGNOSIS — I495 Sick sinus syndrome: Secondary | ICD-10-CM | POA: Diagnosis not present

## 2020-01-15 DIAGNOSIS — I10 Essential (primary) hypertension: Secondary | ICD-10-CM | POA: Diagnosis not present

## 2020-01-15 DIAGNOSIS — Z8551 Personal history of malignant neoplasm of bladder: Secondary | ICD-10-CM | POA: Diagnosis not present

## 2020-01-15 DIAGNOSIS — Z950 Presence of cardiac pacemaker: Secondary | ICD-10-CM | POA: Diagnosis not present

## 2020-01-15 DIAGNOSIS — Z7984 Long term (current) use of oral hypoglycemic drugs: Secondary | ICD-10-CM | POA: Diagnosis not present

## 2020-01-15 DIAGNOSIS — I480 Paroxysmal atrial fibrillation: Secondary | ICD-10-CM | POA: Diagnosis not present

## 2020-01-15 DIAGNOSIS — Z7901 Long term (current) use of anticoagulants: Secondary | ICD-10-CM | POA: Diagnosis not present

## 2020-01-15 DIAGNOSIS — Z79899 Other long term (current) drug therapy: Secondary | ICD-10-CM | POA: Diagnosis not present

## 2020-01-15 DIAGNOSIS — E119 Type 2 diabetes mellitus without complications: Secondary | ICD-10-CM | POA: Diagnosis not present

## 2020-01-15 DIAGNOSIS — Z79890 Hormone replacement therapy (postmenopausal): Secondary | ICD-10-CM | POA: Diagnosis not present

## 2020-01-15 DIAGNOSIS — E785 Hyperlipidemia, unspecified: Secondary | ICD-10-CM | POA: Diagnosis not present

## 2020-01-15 DIAGNOSIS — E039 Hypothyroidism, unspecified: Secondary | ICD-10-CM | POA: Diagnosis not present

## 2020-01-15 DIAGNOSIS — I4819 Other persistent atrial fibrillation: Secondary | ICD-10-CM | POA: Diagnosis not present

## 2020-01-15 DIAGNOSIS — Z888 Allergy status to other drugs, medicaments and biological substances status: Secondary | ICD-10-CM | POA: Diagnosis not present

## 2020-01-15 DIAGNOSIS — Z6829 Body mass index (BMI) 29.0-29.9, adult: Secondary | ICD-10-CM | POA: Diagnosis not present

## 2020-01-29 ENCOUNTER — Telehealth: Payer: Self-pay | Admitting: *Deleted

## 2020-01-29 NOTE — Telephone Encounter (Signed)
Spoke with pt to discuss sustained AF episode noted on PPM between 8/10-8/12. Per device, episode terminated 8/12 at 00:54. Pt reports a brief dizzy spell yesterday after playing golf, lasted a few minutes. States he was well hydrated. BP 130/83 yesterday morning, but he did not check it at time of dizzy spell. Missed a few doses of medications, including amiodarone and Xarelto this past week. Denies recent increased fatigue, chest discomfort, or weakness. States he feels at baseline today. Encouraged compliance with medications. Advised will check back tomorrow on HM to ensure that AF episode has terminated. Pt in agreement with plan and appreciative of call.

## 2020-01-30 NOTE — Telephone Encounter (Signed)
Spoke with pt. Explained Dr. Macky Lower recommendations. Pt states he would prefer to see Dr. Curt Bears in [place]. Scheduled for f/u on 02/02/20 at 11:00am in [place]. Will check on Monday morning to ensure pt has not converted to NSR. Pt in agreement with plan and appreciative of call.

## 2020-01-30 NOTE — Telephone Encounter (Signed)
Spoke with pt to advise that HM data continues to suggest that he is in AT/AF. V rates remain controlled. Pt denies symptoms today, states he realizes now that he has been feeling off the past 3-4 days. Reports he feels better today than he has in a week. Advised will forward information regarding ongoing atrial arrhythmias to Dr. Curt Bears for advisement. Pt states he is trying to be compliant with medications, including amiodarone and Xarelto. ED precautions reviewed with pt. Pt in agreement with plan and denies questions at this time.

## 2020-01-30 NOTE — Telephone Encounter (Signed)
Needs follow up in AF clinic.

## 2020-02-02 ENCOUNTER — Other Ambulatory Visit: Payer: Self-pay

## 2020-02-02 ENCOUNTER — Encounter: Payer: Self-pay | Admitting: *Deleted

## 2020-02-02 ENCOUNTER — Ambulatory Visit (INDEPENDENT_AMBULATORY_CARE_PROVIDER_SITE_OTHER): Payer: Medicare Other | Admitting: Cardiology

## 2020-02-02 ENCOUNTER — Encounter: Payer: Self-pay | Admitting: Cardiology

## 2020-02-02 VITALS — BP 124/60 | HR 60 | Ht 75.0 in | Wt 238.8 lb

## 2020-02-02 DIAGNOSIS — I495 Sick sinus syndrome: Secondary | ICD-10-CM | POA: Diagnosis not present

## 2020-02-02 DIAGNOSIS — Z950 Presence of cardiac pacemaker: Secondary | ICD-10-CM | POA: Diagnosis not present

## 2020-02-02 DIAGNOSIS — I4819 Other persistent atrial fibrillation: Secondary | ICD-10-CM | POA: Diagnosis not present

## 2020-02-02 LAB — CUP PACEART INCLINIC DEVICE CHECK
Brady Statistic RA Percent Paced: 86 %
Brady Statistic RV Percent Paced: 100 %
Date Time Interrogation Session: 20210816120435
Implantable Lead Implant Date: 20180813
Implantable Lead Implant Date: 20180813
Implantable Lead Location: 753859
Implantable Lead Location: 753860
Implantable Lead Model: 377
Implantable Lead Model: 377
Implantable Lead Serial Number: 49893169
Implantable Lead Serial Number: 50011411
Implantable Pulse Generator Implant Date: 20180813
Lead Channel Impedance Value: 507 Ohm
Lead Channel Impedance Value: 526 Ohm
Lead Channel Pacing Threshold Amplitude: 0.8 V
Lead Channel Pacing Threshold Pulse Width: 0.4 ms
Lead Channel Sensing Intrinsic Amplitude: 0.8 mV
Lead Channel Sensing Intrinsic Amplitude: 11.9 mV
Lead Channel Setting Pacing Amplitude: 2.4 V
Lead Channel Setting Pacing Amplitude: 2.4 V
Lead Channel Setting Pacing Pulse Width: 0.4 ms
Pulse Gen Model: 407145
Pulse Gen Serial Number: 69158272

## 2020-02-02 NOTE — Telephone Encounter (Signed)
Per nightly transmission received 02/02/20, pt remains in ongoing AT/AF.

## 2020-02-02 NOTE — Progress Notes (Signed)
Electrophysiology Office Note   Date:  02/02/2020   ID:  Paul Kent, Paul Kent Mar 16, 1941, MRN [id number]  PCP:  [name], MD  Cardiologist:  Primary Electrophysiologist:  Aileene Lanum [name], MD    No chief complaint on file.    History of Present Illness: Paul Kent is a 79 y.o. male who is being seen today for the evaluation of atrial fibrillation at the request of Cox, Kirsten, MD. Presenting today for electrophysiology evaluation.  He has a history significant for chronic atrial fibrillation, hypertension, hyperlipidemia, sleep apnea, type 2 diabetes, sick sinus syndrome status post Biotronik pacemaker.  He had an atrial fibrillation ablation 06/2017.  He was thus switched from flecainide to amiodarone.  His main symptoms are weakness, fatigue.  He felt better the few months after his ablation in sinus rhythm.  He did not have these episodes.  Today, denies symptoms of palpitations, chest pain, shortness of breath, orthopnea, PND, lower extremity edema, claudication, dizziness, presyncope, syncope, bleeding, or neurologic sequela. The patient is tolerating medications without difficulties.  Unfortunately, he has been in atrial fibrillation for the last week.  Prior to that, he was converting in and out of atrial fibrillation.  He has some weakness and fatigue with leg weakness.  He continues to be able to exercise, but does note decreased exercise tolerance.  This is all occurred over the last week.  He is also gained approximately 4 pounds.   Past Medical History:  Diagnosis Date  . Acquired bilateral hammer toes   . Arthralgia of left temporomandibular joint   . Atrial fibrillation ([place]) 05/2016  . Bladder cancer ([place])   . Bradycardia    LOW HEART RATE  . Calculus of ureter   . Cellulitis of right lower limb   . CHF (congestive heart failure) ([place]) 02/2018  . Chronic atrial fibrillation ([place])   . Corns and callosities   . Disturbances of salivary secretion   .  Epistaxis   . Essential hypertension   . Essential tremor   . H pylori ulcer   . Hesitancy of micturition   . Hyperlipidemia   . Hypothyroidism   . Impacted cerumen, bilateral   . Jaw pain   . Localized edema   . Male erectile disorder   . Malignant neoplasm of overlapping sites of bladder ([place])   . Other constipation   . Other fatigue   . Overweight   . Pain in right finger(s)   . Pain in right lower leg   . Peptic ulcer disease   . Primary insomnia   . Renal stones   . Renal stones   . 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 Shingles 06/2012  . Shortness of breath   . Sick sinus syndrome ([place])   . Sleep apnea    dx 08/2015, CPAP  . Spontaneous ecchymoses   . Squamous cell carcinoma of skin    UNSPECIFIED  . Testicular hypofunction   . Type 2 diabetes mellitus ([place]) 1994   UNCONTROLLED, COMPLICATIONS INCLUDE NEPHROPATHY AND PERIPHERAL NEUROPATHY   Past Surgical History:  Procedure Laterality Date  . ABLATION  06/2017  . CARDIOVERSION N/A 03/22/2018   Procedure: CARDIOVERSION;  Surgeon: Buford Dresser, MD;  Location: [facility] ENDOSCOPY;  Service: Cardiovascular;  Laterality: N/A;  . HEMIARTHROPLASTY HIP  10/31/2013   IT HIP BIPOLAR  . LUMBAR SPINE SURGERY    . PACEMAKER PLACEMENT  01/29/2017  . SHOULDER SURGERY Right  Current Outpatient Medications  Medication Sig Dispense Refill  . amiodarone (PACERONE) 200 MG tablet Take 200 mg by mouth daily.     Marland Kitchen atorvastatin (LIPITOR) 10 MG tablet TAKE ONE TABLET BY MOUTH EVERY EVENING 90 tablet 2  . carvedilol (COREG) 12.5 MG tablet Take 6.26 mg by mouth 2 (two) times daily with a meal.    . gabapentin (NEURONTIN) 300 MG capsule Take 300 mg by mouth daily.     Marland Kitchen glipiZIDE (GLUCOTROL XL) 10 MG 24 hr tablet Take 1 tablet (10 mg total) by mouth 2 (two) times daily. Please call for fasting visit in May or June. Thanks, Dr. Tobie Poet 180 tablet 1  . levothyroxine  (SYNTHROID) 137 MCG tablet TAKE ONE TABLET BY MOUTH EVERY MORNING 90 tablet 1  . testosterone cypionate (DEPOTESTOSTERONE CYPIONATE) 200 MG/ML injection INJECT 1 MILLILITER IN THE MUSCLE EVERY 2 WEEKS 10 mL 1  . XARELTO 20 MG TABS tablet TAKE ONE TABLET BY MOUTH EVERY EVENING 90 tablet 1   Current Facility-Administered Medications  Medication Dose Route Frequency Provider Last Rate Last Admin  . triamcinolone acetonide (KENALOG-40) injection 20 mg  20 mg Intra-articular Once Cox, Kirsten, MD        Allergies:   Propranolol, Clonidine derivatives, Hydralazine hcl, Invokana [canagliflozin], Metformin and related, Other, and Topamax [topiramate]   Social History:  The patient  reports that he has never smoked. He has never used smokeless tobacco. He reports current alcohol use. He reports that he does not use drugs.   Family History:  The patient's family history includes Arthritis in an other family member; Congestive Heart Failure in an other family member; Diabetes type II in an other family member; Heart Problems in his father; Heart attack in his mother; Hyperlipidemia in an other family member; Transient ischemic attack in his mother; Tuberculosis in his brother.    ROS:  Please see the history of present illness.   Otherwise, review of systems is positive for none.   All other systems are reviewed and negative.   PHYSICAL EXAM: VS:  BP 124/60   Pulse 60   Ht 6\' 3"  (1.905 m)   Wt 238 lb 12.8 oz (108.3 kg)   SpO2 97%   BMI 29.85 kg/m  , BMI Body mass index is 29.85 kg/m. GEN: Well nourished, well developed, in no acute distress  HEENT: normal  Neck: no JVD, carotid bruits, or masses Cardiac: RRR; no murmurs, rubs, or gallops,no edema  Respiratory:  clear to auscultation bilaterally, normal work of breathing GI: soft, nontender, nondistended, + BS MS: no deformity or atrophy  Skin: warm and dry, device site well healed Neuro:  Strength and sensation are intact Psych: euthymic  mood, full affect  EKG:  EKG is ordered today. Personal review of the ekg ordered shows atrial fibrillation, ventricular paced  Personal review of the device interrogation today. Results in [place]: 09/22/2019: ALT 21; BUN 22; Creatinine, Ser 1.39; Hemoglobin 14.3; Platelets 140; Potassium 5.2; Sodium 141; TSH 3.180    Lipid Panel  No results found for: CHOL, TRIG, HDL, CHOLHDL, VLDL, LDLCALC, LDLDIRECT   Wt Readings from Last 3 Encounters:  02/02/20 238 lb 12.8 oz (108.3 kg)  11/28/19 236 lb (107 kg)  09/22/19 237 lb (107.5 kg)      Other studies Reviewed: Additional studies/ records that were reviewed today include: TTE 03/01/18  Review of the above records today demonstrates:  - Left ventricle: The cavity size was normal. Wall thickness  was   increased in a pattern of mild LVH. Systolic function was mildly   reduced. The estimated ejection fraction was in the range of 45%   to 50%. Diffuse hypokinesis. - Left atrium: The atrium was mildly dilated. Volume/bsa, S: 37.6   ml/m^2. - Right ventricle: The cavity size was moderately dilated. Wall   thickness was normal. Pacer wire or catheter noted in right   ventricle.   ASSESSMENT AND PLAN:  1.  Paroxysmal atrial fibrillation: Currently on Xarelto and amiodarone.  Status post cryoablation 06/20/2017.  CHA2DS2-VASc of 4.  He is unfortunately gone back into atrial fibrillation/flutter.  Due to that, we Minda Faas plan for cardioversion.  He does have weakness and fatigue.  I told him that if he goes back into atrial fibrillation, would likely plan for ablation.  2.  Hypertension: Currently well controlled  3.  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  Device functioning appropriately.  No changes.    4.  Obstructive sleep apnea: CPAP compliance encouraged  5.  Hypothyroidism: Continue Synthroid  6.  Morbid obesity: Weight loss weight loss with diet and exercise encouraged  7.   Hyperlipidemia: Continue Lipitor  Current medicines are reviewed at length with the patient today.   The patient does not have concerns regarding his medicines.  The following changes were made today: None  Labs/ tests ordered today include:  Orders Placed This Encounter  Procedures  . EKG 12-Lead   Disposition:   FU with Veverly Larimer 1.5 months  Signed, Chi Woodham [name], MD  02/02/2020 12:09 PM     [facility] [address] [place] [address] [phone] (office) [phone] (fax)

## 2020-02-02 NOTE — Patient Instructions (Signed)
Medication Instructions:  Your physician recommends that you continue on your current medications as directed. Please refer to the Current Medication list given to you today.  *If you need a refill on your cardiac medications before your next appointment, please call your pharmacy*   Lab Work: None ordered If you have labs (blood work) drawn today and your tests are completely normal, you will receive your results only by: Marland Kitchen MyChart Message (if you have MyChart) OR . A paper copy in the mail If you have any lab test that is abnormal or we need to change your treatment, we will call you to review the results.   Testing/Procedures: Your physician has recommended that you have a Cardioversion (DCCV). Electrical Cardioversion uses a jolt of electricity to your heart either through paddles or wired patches attached to your chest. This is a controlled, usually prescheduled, procedure. Defibrillation is done under light anesthesia in the hospital, and you usually go home the day of the procedure. This is done to get your heart back into a normal rhythm. You are not awake for the procedure. Please see the instruction sheet below located under "other instructions".  The nurse will call you to arrange this    Follow-Up: At [facility], you and your health needs are our priority.  As part of our continuing mission to provide you with exceptional heart care, we have created designated Provider Care Teams.  These Care Teams include your primary Cardiologist (physician) and Advanced Practice Providers (APPs -  Physician Assistants and Nurse Practitioners) who all work together to provide you with the care you need, when you need it.  We recommend signing up for the patient portal called "MyChart".  Sign up information is provided on this After Visit Summary.  MyChart is used to connect with patients for Virtual Visits (Telemedicine).  Patients are able to view lab/test results, encounter notes, upcoming  appointments, etc.  Non-urgent messages can be sent to your provider as well.   To learn more about what you can do with MyChart, go to [email or web address].    Your next appointment:   2 month(s)  The format for your next appointment:   In Person  Provider:   Allegra Lai, MD   Thank you for choosing [facility]!!   Trinidad Curet, RN [phone]    Other Instructions   You are scheduled for a Cardioversion on _________________ with Dr. ______________  Dennis Bast will need to be Covid screened prior to this procedure.  You will need to be screened within 7 days of the procedure, and results dropped off at our office. . Please arrive at [facility]  at ______________ a.m. / p.m. on the day of your procedure.  DIET:            A) Nothing to eat or drink after midnight except your medications with a sip of water  1) Come to the office on _______________ for lab work. You do not have to be fasting.  2) MAKE SURE YOU TAKE YOUR XARELTO.  3) A)  DO NOT TAKE these medications before your procedure: Glipizide  B) YOU MAY TAKE ALL of your remaining medications with a small amount of water.  4) Must have a responsible person to drive you home.  5) Bring a current list of your medications and current insurance cards.   * Special note: Every effort is made to have your procedure done on time. Occasionally there are emergencies that present themselves at the hospital that  may cause delays. Please be patient if a delay does occur.  If you have any questions after you get home, please call the office at [phone].     Electrical Cardioversion Electrical cardioversion is the delivery of a jolt of electricity to change the rhythm of the heart. Sticky patches or metal paddles are placed on the chest to deliver the electricity from a device. This is done to restore a normal rhythm. A rhythm that is too fast or not regular keeps the heart from pumping well. Electrical  cardioversion is done in an emergency if:   There is low or no blood pressure as a result of the heart rhythm.    Normal rhythm must be restored as fast as possible to protect the brain and heart from further damage.    It may save a life. Cardioversion may be done for heart rhythms that are not immediately life threatening, such as atrial fibrillation or flutter, in which:   The heart is beating too fast or is not regular.    Medicine to change the rhythm has not worked.    It is safe to wait in order to allow time for preparation.  Symptoms of the abnormal rhythm are bothersome.  The risk of stroke and other serious problems can be reduced.  LET [facility] CARE PROVIDER KNOW ABOUT:   Any allergies you have.  All medicines you are taking, including vitamins, herbs, eye drops, creams, and over-the-counter medicines.  Previous problems you or members of your family have had with the use of anesthetics.    Any blood disorders you have.    Previous surgeries you have had.    Medical conditions you have.   RISKS AND COMPLICATIONS  Generally, this is a safe procedure. However, problems can occur and include:   Breathing problems related to the anesthetic used.  A blood clot that breaks free and travels to other parts of your body. This could cause a stroke or other problems. The risk of this is lowered by use of blood-thinning medicine (anticoagulant) prior to the procedure.  Cardiac arrest (rare).   BEFORE THE PROCEDURE   You may have tests to detect blood clots in your heart and to evaluate heart function.   You may start taking anticoagulants so your blood does not clot as easily.    Medicines may be given to help stabilize your heart rate and rhythm.   PROCEDURE  You will be given medicine through an IV tube to reduce discomfort and make you sleepy (sedative).    An electrical shock will be delivered.   AFTER THE PROCEDURE Your heart rhythm will be watched  to make sure it does not change. You will need someone to drive you home.

## 2020-02-05 ENCOUNTER — Telehealth: Payer: Self-pay | Admitting: Cardiology

## 2020-02-05 NOTE — Telephone Encounter (Signed)
New Message  Patient is calling in to schedule ablation. Please give patient a call back to schedule.

## 2020-02-05 NOTE — Telephone Encounter (Signed)
Pt was calling to speak with Sherri RN about getting his DCCV scheduled, not ablation, as indicated in this message.  Pt is aware that he needs to have a DCCV scheduled for afib, and if this is unsuccessful, then Dr. Curt Bears will proceed with getting him scheduled for an ablation.  Pt states Sherri RN was going to arrange DCCV at [facility], and Dr. Curt Bears wanted him to have this done within the next 2 weeks. Informed the pt that Sherri RN will be getting with him in the very near future to arrange this procedure.  Reassured him of this.  Informed him that she is off today, and will return to the office tomorrow. Informed the pt that I will route this message to [facility], so she can review and further follow-up with him, upon return to the office.  Pt verbalized understanding and agrees with this plan. Pt was gracious for explaining the process to him.

## 2020-02-06 NOTE — Telephone Encounter (Signed)
Spoke to wife, procedure instructions reviewed. Pt will stop by urgent care this weekend to get covid screening.  He will drop off results to [place] office. Aware office will be in touch to arrange post procedure follow up. Wife agreeable to plan.

## 2020-02-07 DIAGNOSIS — Z20828 Contact with and (suspected) exposure to other viral communicable diseases: Secondary | ICD-10-CM | POA: Diagnosis not present

## 2020-02-07 DIAGNOSIS — R2689 Other abnormalities of gait and mobility: Secondary | ICD-10-CM | POA: Diagnosis not present

## 2020-02-09 ENCOUNTER — Telehealth: Payer: Self-pay | Admitting: Cardiology

## 2020-02-09 NOTE — Telephone Encounter (Signed)
Pam is calling stating she is needing the entire history and physical for the patient faxed over to the same number Sherri faxed the information over to before. Please advise.

## 2020-02-10 ENCOUNTER — Other Ambulatory Visit: Payer: Self-pay | Admitting: Family Medicine

## 2020-02-10 NOTE — Telephone Encounter (Signed)
Spoke to [employer] at [facility]. H&P OV paperwork re-faxed. (faxed to [phone]) Covid negative results also faxed. Confirmation received.

## 2020-02-12 ENCOUNTER — Other Ambulatory Visit: Payer: Self-pay

## 2020-02-12 DIAGNOSIS — G4733 Obstructive sleep apnea (adult) (pediatric): Secondary | ICD-10-CM | POA: Diagnosis not present

## 2020-02-12 DIAGNOSIS — I4819 Other persistent atrial fibrillation: Secondary | ICD-10-CM | POA: Diagnosis not present

## 2020-02-12 DIAGNOSIS — I10 Essential (primary) hypertension: Secondary | ICD-10-CM | POA: Diagnosis not present

## 2020-02-12 DIAGNOSIS — Z7901 Long term (current) use of anticoagulants: Secondary | ICD-10-CM | POA: Diagnosis not present

## 2020-02-12 DIAGNOSIS — I480 Paroxysmal atrial fibrillation: Secondary | ICD-10-CM | POA: Diagnosis not present

## 2020-02-12 DIAGNOSIS — E039 Hypothyroidism, unspecified: Secondary | ICD-10-CM | POA: Diagnosis not present

## 2020-02-12 MED ORDER — AMIODARONE HCL 200 MG PO TABS: 200.0000 mg | ORAL_TABLET | Freq: Every day | ORAL | 0 refills | Status: DC

## 2020-03-09 ENCOUNTER — Other Ambulatory Visit: Payer: Self-pay | Admitting: Family Medicine

## 2020-03-10 ENCOUNTER — Ambulatory Visit (INDEPENDENT_AMBULATORY_CARE_PROVIDER_SITE_OTHER): Payer: Medicare Other | Admitting: *Deleted

## 2020-03-10 DIAGNOSIS — I480 Paroxysmal atrial fibrillation: Secondary | ICD-10-CM | POA: Diagnosis not present

## 2020-03-10 LAB — CUP PACEART REMOTE DEVICE CHECK
Date Time Interrogation Session: 20210922065929
Implantable Lead Implant Date: 20180813
Implantable Lead Implant Date: 20180813
Implantable Lead Location: 753859
Implantable Lead Location: 753860
Implantable Lead Model: 377
Implantable Lead Model: 377
Implantable Lead Serial Number: 49893169
Implantable Lead Serial Number: 50011411
Implantable Pulse Generator Implant Date: 20180813
Pulse Gen Model: 407145
Pulse Gen Serial Number: 69158272

## 2020-03-12 NOTE — Progress Notes (Signed)
Remote pacemaker transmission.   

## 2020-03-22 ENCOUNTER — Encounter: Payer: Self-pay | Admitting: Family Medicine

## 2020-03-22 ENCOUNTER — Ambulatory Visit (INDEPENDENT_AMBULATORY_CARE_PROVIDER_SITE_OTHER): Payer: Medicare Other

## 2020-03-22 ENCOUNTER — Ambulatory Visit: Payer: Medicare Other

## 2020-03-22 DIAGNOSIS — Z23 Encounter for immunization: Secondary | ICD-10-CM | POA: Diagnosis not present

## 2020-04-06 ENCOUNTER — Other Ambulatory Visit: Payer: Self-pay | Admitting: Family Medicine

## 2020-04-12 ENCOUNTER — Other Ambulatory Visit: Payer: Self-pay | Admitting: *Deleted

## 2020-04-12 ENCOUNTER — Other Ambulatory Visit: Payer: Self-pay | Admitting: Family Medicine

## 2020-04-12 ENCOUNTER — Ambulatory Visit (INDEPENDENT_AMBULATORY_CARE_PROVIDER_SITE_OTHER): Payer: Medicare Other | Admitting: Cardiology

## 2020-04-12 ENCOUNTER — Other Ambulatory Visit: Payer: Self-pay | Admitting: Cardiology

## 2020-04-12 ENCOUNTER — Encounter: Payer: Self-pay | Admitting: Cardiology

## 2020-04-12 ENCOUNTER — Other Ambulatory Visit: Payer: Self-pay

## 2020-04-12 VITALS — BP 128/64 | HR 80 | Ht 75.0 in | Wt 233.0 lb

## 2020-04-12 DIAGNOSIS — Z01812 Encounter for preprocedural laboratory examination: Secondary | ICD-10-CM

## 2020-04-12 DIAGNOSIS — I4819 Other persistent atrial fibrillation: Secondary | ICD-10-CM

## 2020-04-12 DIAGNOSIS — I480 Paroxysmal atrial fibrillation: Secondary | ICD-10-CM | POA: Diagnosis not present

## 2020-04-12 NOTE — Patient Instructions (Addendum)
Medication Instructions:  Your physician recommends that you continue on your current medications as directed. Please refer to the Current Medication list given to you today.  *If you need a refill on your cardiac medications before your next appointment, please call your pharmacy*   Lab Work: None ordered If you have labs (blood work) drawn today and your tests are completely normal, you will receive your results only by: Marland Kitchen MyChart Message (if you have MyChart) OR . A paper copy in the mail If you have any lab test that is abnormal or we need to change your treatment, we will call you to review the results.   Testing/Procedures: Your physician has requested that you have cardiac CT. Cardiac computed tomography (CT) is a painless test that uses an x-ray machine to take clear, detailed pictures of your heart. Please follow instructions below located under "other instructions".   Your physician has recommended that you have an ablation. Catheter ablation is a medical procedure used to treat some cardiac arrhythmias (irregular heartbeats). During catheter ablation, a long, thin, flexible tube is put into a blood vessel in your groin (upper thigh), or neck. This tube is called an ablation catheter. It is then guided to your heart through the blood vessel. Radio frequency waves destroy small areas of heart tissue where abnormal heartbeats may cause an arrhythmia to start. Please follow instructions below located under "other instructions".   Follow-Up: At [facility], you and your health needs are our priority.  As part of our continuing mission to provide you with exceptional heart care, we have created designated Provider Care Teams.  These Care Teams include your primary Cardiologist (physician) and Advanced Practice Providers (APPs -  Physician Assistants and Nurse Practitioners) who all work together to provide you with the care you need, when you need it.  We recommend signing up for the  patient portal called "MyChart".  Sign up information is provided on this After Visit Summary.  MyChart is used to connect with patients for Virtual Visits (Telemedicine).  Patients are able to view lab/test results, encounter notes, upcoming appointments, etc.  Non-urgent messages can be sent to your provider as well.   To learn more about what you can do with MyChart, go to [email or web address].    Remote monitoring is used to monitor your Pacemaker or ICD from home. This monitoring reduces the number of office visits required to check your device to one time per year. It allows [place] to keep an eye on the functioning of your device to ensure it is working properly. You are scheduled for a device check from home on 06/10/2020. You may send your transmission at any time that day. If you have a wireless device, the transmission will be sent automatically. After your physician reviews your transmission, you will receive a postcard with your next transmission date.  Your next appointment:   4 week(s) after your ablation on 12/3  The format for your next appointment:   In Person  Provider:   You will follow up in the [facility] located at [facility]. Your provider will be: Roderic Palau, NP or Clint R. Fenton, PA-C   Thank you for choosing [facility]!!   Trinidad Curet, RN [phone]   Other Instructions CT INSTRUCTIONS  Your cardiac CT will be scheduled at:  [facility] [address] [place], [place] [address] [phone]  Please arrive at the [facility] main entrance of [facility] 30 minutes prior  to test start time. Proceed to the [facility] Radiology Department (first floor) to check-in and test prep.  Please follow these instructions carefully (unless otherwise directed):  Hold all erectile dysfunction medications at least 3 days (72 hrs) prior to test.  On the Night Before the Test: . Be sure to Drink plenty of  water. . Do not consume any caffeinated/decaffeinated beverages or chocolate 12 hours prior to your test. . Do not take any antihistamines 12 hours prior to your test.  On the Day of the Test: . Drink plenty of water. Do not drink any water within one hour of the test. . Do not eat any food 4 hours prior to the test. . You may take your regular medications prior to the test.  . Take metoprolol (Lopressor) 50 mg two hours prior to test.  You will hold your Carvedilol the morning of this test. . HOLD Furosemide/Hydrochlorothiazide morning of the test.  After the Test: . Drink plenty of water. . After receiving IV contrast, you may experience a mild flushed feeling. This is normal. . On occasion, you may experience a mild rash up to 24 hours after the test. This is not dangerous. If this occurs, you can take Benadryl 25 mg and increase your fluid intake. . If you experience trouble breathing, this can be serious. If it is severe call 911 IMMEDIATELY. If it is mild, please call our office. . If you take any of these medications: Glipizide/Metformin, Avandament, Glucavance, please do not take 48 hours after completing test unless otherwise instructed.   Once we have confirmed authorization from your insurance company, we will call you to set up a date and time for your test. Based on how quickly your insurance processes prior authorizations requests, please allow up to 4 weeks to be contacted for scheduling your Cardiac CT appointment. Be advised that routine Cardiac CT appointments could be scheduled as many as 8 weeks after your provider has ordered it.  For non-scheduling related questions, please contact the cardiac imaging nurse navigator should you have any questions/concerns: Marchia Bond, Cardiac Imaging Nurse Navigator Burley Saver, Interim Cardiac Imaging Nurse [place] [facility] Direct Office Dial: [phone]   For scheduling needs, including  cancellations and rescheduling, please call Vivien Rota at [phone], option 3.      Electrophysiology/Ablation Procedure Instructions   You are scheduled for a(n)  ablation on 05/21/2020 with Dr. Allegra Lai.   1.   Pre procedure testing-             A.  LAB WORK --- Between 11/08 - 11/22  for your pre procedure blood work.  You can stop by the [facility] office for this, you do NOT need to be fasting (avoid 12 pm - 1:30 pm)               B. COVID TEST-- On 05/19/20 @ 10:00 am - This is a Drive Up Visit at [address] [place], [place] [address].  Someone will direct you to the appropriate testing line. Stay in your car and someone will be with you shortly.   After you are tested please go home and self quarantine until the day of your procedure.     2. On the day of your procedure 05/21/2020 you will go to [facility] [phone]) at 5:30 am.  Dennis Bast will go to the main entrance [address] [employer]) and enter where the [employer] are.  Your  driver will drop you off and you will head down the hallway to ADMITTING.  You may have one support person come in to the hospital with you.  They will be asked to wait in the waiting room.   3.   Do not eat or drink after midnight prior to your procedure.   4.   Do not miss any doses of your blood thinner prior to the morning of your procedure or your procedure will need to be rescheduled.       Do NOT take any medications the morning of your procedure.   5.  Plan for an overnight stay, but you may be discharged home after your procedure.    If you use your phone frequently bring your phone charger, in case you have to stay.  If you are discharged after your procedure you will need someone to drive you home and be with your for 24 hours after your procedure.   6. You will follow up with the AFIB clinic 4 weeks after your procedure.  You will follow up with Dr. Curt Bears  3 months after your procedure.  These appointments will be made for  you.   * If you have ANY questions please call the office [phone] and ask for Lottie Siska RN or send me a MyChart message   * Occasionally, EP Studies and ablations can become lengthy.  Please make your family aware of this before your procedure starts.  Average time ranges from 2-8 hours for EP studies/ablations.  Your physician will call your family after the procedure with the results.                                   Cardiac Ablation  Cardiac ablation is a procedure to stop some heart tissue from causing problems. The heart has many electrical connections. Sometimes these connections make the heart beat very fast or irregularly. Removing some problem areas can improve the heart rhythm or make it normal. What happens before the procedure?  Follow instructions from your doctor about what you cannot eat or drink.  Ask your doctor about: ? Changing or stopping your normal medicines. This is important if you take diabetes medicines or blood thinners. ? Taking medicines such as aspirin and ibuprofen. These medicines can thin your blood. Do not take these medicines before your procedure if your doctor tells you not to.  Plan to have someone take you home.  If you will be going home right after the procedure, plan to have someone with you for 24 hours. What happens during the procedure?  To lower your risk of infection: ? Your health care team will wash or sanitize their hands. ? Your skin will be washed with soap. ? Hair may be removed from your neck or groin.  An IV tube will be put into one of your veins.  You will be given a medicine to help you relax (sedative).  Skin on your neck or groin will be numbed.  A cut (incision) will be made in your neck or groin.  A needle will be put through your cut and into a vein in your neck or groin.  A tube (catheter) will be put into the needle. The tube will be moved to your heart. X-rays (fluoroscopy) will be used to help guide the  tube.  Small devices (electrodes) on the tip of the tube will send out electrical currents.  Dye may be put through the tube. This helps your surgeon see your heart.  Electrical energy will be used to scar (ablate) some heart tissue. Your surgeon may use: ? Heat (radiofrequency energy). ? Laser energy. ? Extreme cold (cryoablation).  The tube will be taken out.  Pressure will be held on your cut. This helps stop bleeding.  A bandage (dressing) will be put on your cut. The procedure may vary. What happens after the procedure?  You will be monitored until your medicines have worn off.  Your cut will be watched for bleeding. You will need to lie still for a few hours.  Do not drive for 24 hours or as long as your doctor tells you. Summary  Cardiac ablation is a procedure to stop some heart tissue from causing problems.  Electrical energy will be used to scar (ablate) some heart tissue. This information is not intended to replace advice given to you by your health care provider. Make sure you discuss any questions you have with your health care provider. Document Revised: 05/18/2017 Document Reviewed: 04/24/2016 Elsevier Patient Education  2020 [employer].

## 2020-04-12 NOTE — Progress Notes (Signed)
Electrophysiology Office Note   Date:  04/12/2020   ID:  Paul Kent, Paul Kent [birth year], MRN [id number]  PCP:  [name], MD  Cardiologist:  Primary Electrophysiologist:  Sabella Traore [name], MD    No chief complaint on file.    History of Present Illness: Paul Kent is a 79 y.o. male who is being seen today for the evaluation of atrial fibrillation at the request of Cox, Kirsten, MD. Presenting today for electrophysiology evaluation.    He has a history significant for persistent atrial fibrillation, hypertension, hyperlipidemia, sleep apnea, type 2 diabetes, sick sinus syndrome status post Biotronik pacemaker.  He had an AF cryoablation January 2019.  Post ablation, he switched from flecainide to amiodarone.  Today, denies symptoms of palpitations, chest pain, shortness of breath, orthopnea, PND, lower extremity edema, claudication, dizziness, presyncope, syncope, bleeding, or neurologic sequela. The patient is tolerating medications without difficulties.  He is currently feeling well.  Since his cardioversion he has done well and has no major complaints.  He has not noted any further episodes of atrial fibrillation.  He would like to get off of his amiodarone.   Past Medical History:  Diagnosis Date  . Acquired bilateral hammer toes   . Arthralgia of left temporomandibular joint   . Atrial fibrillation ([place]) 05/2016  . Bladder cancer ([place])   . Bradycardia    LOW HEART RATE  . Calculus of ureter   . Cellulitis of right lower limb   . CHF (congestive heart failure) ([place]) 02/2018  . Chronic atrial fibrillation ([place])   . Corns and callosities   . Disturbances of salivary secretion   . Epistaxis   . Essential hypertension   . Essential tremor   . H pylori ulcer   . Hesitancy of micturition   . Hyperlipidemia   . Hypothyroidism   . Impacted cerumen, bilateral   . Jaw pain   . Localized edema   . Male erectile disorder   . Malignant neoplasm of overlapping  sites of bladder ([place])   . Other constipation   . Other fatigue   . Overweight   . Pain in right finger(s)   . Pain in right lower leg   . Peptic ulcer disease   . Primary insomnia   . Renal stones   . Renal stones   . 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 Shingles 06/2012  . Shortness of breath   . Sick sinus syndrome ([place])   . Sleep apnea    dx 08/2015, CPAP  . Spontaneous ecchymoses   . Squamous cell carcinoma of skin    UNSPECIFIED  . Testicular hypofunction   . Type 2 diabetes mellitus ([employer]) 1994   UNCONTROLLED, COMPLICATIONS INCLUDE NEPHROPATHY AND PERIPHERAL NEUROPATHY   Past Surgical History:  Procedure Laterality Date  . ABLATION  06/2017  . CARDIOVERSION N/A 03/22/2018   Procedure: CARDIOVERSION;  Surgeon: Buford Dresser, MD;  Location: [facility] ENDOSCOPY;  Service: Cardiovascular;  Laterality: N/A;  . HEMIARTHROPLASTY HIP  10/31/2013   IT HIP BIPOLAR  . LUMBAR SPINE SURGERY    . PACEMAKER PLACEMENT  01/29/2017  . SHOULDER SURGERY Right      Current Outpatient Medications  Medication Sig Dispense Refill  . amiodarone (PACERONE) 200 MG tablet Take 1 tablet (200 mg total) by mouth daily. 90 tablet 0  . atorvastatin (LIPITOR) 10 MG tablet TAKE ONE TABLET BY MOUTH EVERY EVENING 90 tablet 2  . carvedilol (  COREG) 12.5 MG tablet Take 6.26 mg by mouth 2 (two) times daily with a meal.    . gabapentin (NEURONTIN) 300 MG capsule TAKE ONE CAPSULE BY MOUTH 3 TIMES DAILY 90 capsule 5  . glipiZIDE (GLUCOTROL XL) 10 MG 24 hr tablet TAKE ONE TABLET BY MOUTH TWICE DAILY 180 tablet 1  . levothyroxine (SYNTHROID) 137 MCG tablet TAKE ONE TABLET BY MOUTH EVERY MORNING 90 tablet 1  . testosterone cypionate (DEPOTESTOSTERONE CYPIONATE) 200 MG/ML injection INJECT 1 MILLILITER IN THE MUSCLE EVERY 2 WEEKS 10 mL 1  . XARELTO 20 MG TABS tablet TAKE ONE TABLET BY MOUTH EVERY EVENING 90 tablet 1   Current Facility-Administered  Medications  Medication Dose Route Frequency Provider Last Rate Last Admin  . triamcinolone acetonide (KENALOG-40) injection 20 mg  20 mg Intra-articular Once Cox, Kirsten, MD        Allergies:   Propranolol, Clonidine derivatives, Hydralazine hcl, Invokana [canagliflozin], Metformin and related, Other, and Topamax [topiramate]   Social History:  The patient  reports that he has never smoked. He has never used smokeless tobacco. He reports current alcohol use. He reports that he does not use drugs.   Family History:  The patient's family history includes Arthritis in an other family member; Congestive Heart Failure in an other family member; Diabetes type II in an other family member; Heart Problems in his father; Heart attack in his mother; Hyperlipidemia in an other family member; Transient ischemic attack in his mother; Tuberculosis in his brother.    ROS:  Please see the history of present illness.   Otherwise, review of systems is positive for none.   All other systems are reviewed and negative.   PHYSICAL EXAM: VS:  BP 128/64   Pulse 80   Ht 6\' 3"  (1.905 m)   Wt 233 lb (105.7 kg)   SpO2 94%   BMI 29.12 kg/m  , BMI Body mass index is 29.12 kg/m. GEN: Well nourished, well developed, in no acute distress  HEENT: normal  Neck: no JVD, carotid bruits, or masses Cardiac: RRR; no murmurs, rubs, or gallops,no edema  Respiratory:  clear to auscultation bilaterally, normal work of breathing GI: soft, nontender, nondistended, + BS MS: no deformity or atrophy  Skin: warm and dry, device site well healed Neuro:  Strength and sensation are intact Psych: euthymic mood, full affect  EKG:  EKG is ordered today. Personal review of the ekg ordered shows AV paced, rate 80  Personal review of the device interrogation today. Results in [place]: 09/22/2019: ALT 21; BUN 22; Creatinine, Ser 1.39; Hemoglobin 14.3; Platelets 140; Potassium 5.2; Sodium 141; TSH 3.180    Lipid Panel   No results found for: CHOL, TRIG, HDL, CHOLHDL, VLDL, LDLCALC, LDLDIRECT   Wt Readings from Last 3 Encounters:  04/12/20 233 lb (105.7 kg)  02/02/20 238 lb 12.8 oz (108.3 kg)  11/28/19 236 lb (107 kg)      Other studies Reviewed: Additional studies/ records that were reviewed today include: TTE 03/01/18  Review of the above records today demonstrates:  - Left ventricle: The cavity size was normal. Wall thickness was   increased in a pattern of mild LVH. Systolic function was mildly   reduced. The estimated ejection fraction was in the range of 45%   to 50%. Diffuse hypokinesis. - Left atrium: The atrium was mildly dilated. Volume/bsa, S: 37.6   ml/m^2. - Right ventricle: The cavity size was moderately dilated. Wall   thickness was  normal. Pacer wire or catheter noted in right   ventricle.   ASSESSMENT AND PLAN:  1.  Persistent atrial fibrillation: Currently on Xarelto and amiodarone (ECG monitoring for high risk medication).  Status post cryoablation 06/20/2017.  CHA2DS2-VASc of 4.  He has had a cardioversion recently.  I spoken him about the possibility of ablation to get off of his amiodarone.  At this point being off of amiodarone would be more ideal.  We Johann Santone plan for ablation.  Risks and benefits were discussed.  Risk include bleeding, tamponade, heart block, stroke, damage to chest organs.  He understands these risks and is agreed to the procedure.  2.  Hypertension: Currently well controlled  3.  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  Device functioning appropriately.  No changes.    4.  Obstructive sleep apnea: CPAP compliance encouraged  5.  Hypothyroidism: Continue Synthroid  6.  Morbid obesity: Weight loss with diet and exercise encouraged  7.  Hyperlipidemia: Continue Lipitor   Current medicines are reviewed at length with the patient today.   The patient does not have concerns regarding his medicines.  The  following changes were made today: None  Labs/ tests ordered today include:  Orders Placed This Encounter  Procedures  . CT CARDIAC MORPH/PULM VEIN W/CM&W/O CA SCORE  . Basic metabolic panel  . CBC  . EKG 12-Lead   Disposition:   FU with Jesten Cappuccio 3 months  Signed, Stephany Poorman [name], MD  04/12/2020 11:13 AM     [facility] [address] [place] [address] [phone] (office) [phone] (fax)

## 2020-04-14 ENCOUNTER — Other Ambulatory Visit: Payer: Self-pay

## 2020-04-14 MED ORDER — CARVEDILOL 12.5 MG PO TABS
6.2600 mg | ORAL_TABLET | Freq: Two times a day (BID) | ORAL | 3 refills | Status: DC
Start: 2020-04-14 — End: 2020-09-20

## 2020-04-20 ENCOUNTER — Telehealth (HOSPITAL_COMMUNITY): Payer: Self-pay

## 2020-04-23 MED ORDER — METOPROLOL TARTRATE 50 MG PO TABS: ORAL_TABLET | ORAL | 0 refills | Status: DC

## 2020-04-23 NOTE — Addendum Note (Signed)
Addended by: Stanton Kidney on: 04/23/2020 09:14 AM   Modules accepted: Orders

## 2020-05-05 ENCOUNTER — Other Ambulatory Visit: Payer: Self-pay | Admitting: Family Medicine

## 2020-05-05 ENCOUNTER — Telehealth: Payer: Self-pay | Admitting: *Deleted

## 2020-05-05 NOTE — Telephone Encounter (Signed)
°  Patient Consent for Virtual Visit         Paul Kent has provided verbal consent on 05/05/2020 for a virtual visit (video or telephone).   CONSENT FOR VIRTUAL VISIT FOR:  Paul Kent  By participating in this virtual visit I agree to the following:  I hereby voluntarily request, consent and authorize [facility] and its employed or contracted physicians, physician assistants, nurse practitioners or other licensed health care professionals (the Practitioner), to provide me with telemedicine health care services (the Services") as deemed necessary by the treating Practitioner. I acknowledge and consent to receive the Services by the Practitioner via telemedicine. I understand that the telemedicine visit will involve communicating with the Practitioner through live audiovisual communication technology and the disclosure of certain medical information by electronic transmission. I acknowledge that I have been given the opportunity to request an in-person assessment or other available alternative prior to the telemedicine visit and am voluntarily participating in the telemedicine visit.  I understand that I have the right to withhold or withdraw my consent to the use of telemedicine in the course of my care at any time, without affecting my right to future care or treatment, and that the Practitioner or I may terminate the telemedicine visit at any time. I understand that I have the right to inspect all information obtained and/or recorded in the course of the telemedicine visit and may receive copies of available information for a reasonable fee.  I understand that some of the potential risks of receiving the Services via telemedicine include:   Delay or interruption in medical evaluation due to technological equipment failure or disruption;  Information transmitted may not be sufficient (e.g. poor resolution of images) to allow for appropriate medical decision making by the  Practitioner; and/or   In rare instances, security protocols could fail, causing a breach of personal health information.  Furthermore, I acknowledge that it is my responsibility to provide information about my medical history, conditions and care that is complete and accurate to the best of my ability. I acknowledge that Practitioner's advice, recommendations, and/or decision may be based on factors not within their control, such as incomplete or inaccurate data provided by me or distortions of diagnostic images or specimens that may result from electronic transmissions. I understand that the practice of medicine is not an exact science and that Practitioner makes no warranties or guarantees regarding treatment outcomes. I acknowledge that a copy of this consent can be made available to me via my patient portal ([facility]), or I can request a printed copy by calling the office of [facility].    I understand that my insurance will be billed for this visit.   I have read or had this consent read to me.  I understand the contents of this consent, which adequately explains the benefits and risks of the Services being provided via telemedicine.   I have been provided ample opportunity to ask questions regarding this consent and the Services and have had my questions answered to my satisfaction.  I give my informed consent for the services to be provided through the use of telemedicine in my medical care

## 2020-05-06 ENCOUNTER — Encounter: Payer: Self-pay | Admitting: Cardiology

## 2020-05-06 ENCOUNTER — Other Ambulatory Visit: Payer: Self-pay

## 2020-05-06 ENCOUNTER — Telehealth (INDEPENDENT_AMBULATORY_CARE_PROVIDER_SITE_OTHER): Payer: Medicare Other | Admitting: Cardiology

## 2020-05-06 VITALS — BP 135/82

## 2020-05-06 DIAGNOSIS — Z01812 Encounter for preprocedural laboratory examination: Secondary | ICD-10-CM | POA: Diagnosis not present

## 2020-05-06 DIAGNOSIS — I4819 Other persistent atrial fibrillation: Secondary | ICD-10-CM

## 2020-05-06 LAB — BASIC METABOLIC PANEL
BUN/Creatinine Ratio: 12 (ref 10–24)
BUN: 16 mg/dL (ref 8–27)
CO2: 23 mmol/L (ref 20–29)
Calcium: 8.3 mg/dL — ABNORMAL LOW (ref 8.6–10.2)
Chloride: 101 mmol/L (ref 96–106)
Creatinine, Ser: 1.3 mg/dL — ABNORMAL HIGH (ref 0.76–1.27)
GFR calc Af Amer: 59 mL/min/{1.73_m2} — ABNORMAL LOW (ref >59)
GFR calc non Af Amer: 51 mL/min/{1.73_m2} — ABNORMAL LOW (ref >59)
Glucose: 186 mg/dL — ABNORMAL HIGH (ref 65–99)
Potassium: 4.4 mmol/L (ref 3.5–5.2)
Sodium: 137 mmol/L (ref 134–144)

## 2020-05-06 LAB — CBC
Hematocrit: 48.3 % (ref 37.5–51.0)
Hemoglobin: 16 g/dL (ref 13.0–17.7)
MCH: 33.1 pg — ABNORMAL HIGH (ref 26.6–33.0)
MCHC: 33.1 g/dL (ref 31.5–35.7)
MCV: 100 fL — ABNORMAL HIGH (ref 79–97)
Platelets: 160 10*3/uL (ref 150–450)
RBC: 4.84 x10E6/uL (ref 4.14–5.80)
RDW: 12.2 % (ref 11.6–15.4)
WBC: 4.1 10*3/uL (ref 3.4–10.8)

## 2020-05-06 NOTE — Progress Notes (Signed)
Electrophysiology TeleHealth Note   Due to national recommendations of social distancing due to COVID 19, an audio/video telehealth visit is felt to be most appropriate for this patient at this time.  See Epic message for the patient's consent to telehealth for [facility].   Date:  05/06/2020   ID:  Paul Kent, DOB Feb 10, 1941, MRN [id number]  Location: patient's home  Provider location: [address] [place]  Evaluation Performed: Follow-up visit  PCP:  [name], MD  Cardiologist:  No primary care provider on file.  Electrophysiologist:  Dr Curt Bears  Chief Complaint:  AF  History of Present Illness:    Paul Kent is a 79 y.o. male who presents via audio/video conferencing for a telehealth visit today.  Since last being seen in our clinic, the patient reports doing very well.  Today, he denies symptoms of palpitations, chest pain, shortness of breath,  lower extremity edema, dizziness, presyncope, or syncope.  The patient is otherwise without complaint today.  The patient denies symptoms of fevers, chills, cough, or new SOB worrisome for COVID 19.  He has a history of persistent atrial fibrillation, hypertension, hyperlipidemia, sleep apnea, type 2 diabetes, sick sinus syndrome status post Biotronik pacemaker. He had a cryoablation in Jan 2019. Post ablation, he was started on amiodarone. He has had more episodes of atrial fibrillation and is planned for ablation 05/21/20.  Today, denies symptoms of palpitations, chest pain, shortness of breath, orthopnea, PND, lower extremity edema, claudication, dizziness, presyncope, syncope, bleeding, or neurologic sequela. The patient is tolerating medications without difficulties.    Past Medical History:  Diagnosis Date  . Acquired bilateral hammer toes   . Arthralgia of left temporomandibular joint   . Atrial fibrillation ([place]) 05/2016  . Bladder cancer ([place])   . Bradycardia    LOW HEART RATE  . Calculus  of ureter   . Cellulitis of right lower limb   . CHF (congestive heart failure) ([place]) 02/2018  . Chronic atrial fibrillation ([place])   . Corns and callosities   . Disturbances of salivary secretion   . Epistaxis   . Essential hypertension   . Essential tremor   . H pylori ulcer   . Hesitancy of micturition   . Hyperlipidemia   . Hypothyroidism   . Impacted cerumen, bilateral   . Jaw pain   . Localized edema   . Male erectile disorder   . Malignant neoplasm of overlapping sites of bladder ([place])   . Other constipation   . Other fatigue   . Overweight   . Pain in right finger(s)   . Pain in right lower leg   . Peptic ulcer disease   . Primary insomnia   . Renal stones   . Renal stones   . 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 Shingles 06/2012  . Shortness of breath   . Sick sinus syndrome ([place])   . Sleep apnea    dx 08/2015, CPAP  . Spontaneous ecchymoses   . Squamous cell carcinoma of skin    UNSPECIFIED  . Testicular hypofunction   . Type 2 diabetes mellitus ([place]) 1994   UNCONTROLLED, COMPLICATIONS INCLUDE NEPHROPATHY AND PERIPHERAL NEUROPATHY    Past Surgical History:  Procedure Laterality Date  . ABLATION  06/2017  . CARDIOVERSION N/A 03/22/2018   Procedure: CARDIOVERSION;  Surgeon: Buford Dresser, MD;  Location: [facility] ENDOSCOPY;  Service: Cardiovascular;  Laterality: N/A;  .  HEMIARTHROPLASTY HIP  10/31/2013   IT HIP BIPOLAR  . LUMBAR SPINE SURGERY    . PACEMAKER PLACEMENT  01/29/2017  . SHOULDER SURGERY Right     Current Outpatient Medications  Medication Sig Dispense Refill  . amiodarone (PACERONE) 200 MG tablet TAKE ONE TABLET BY MOUTH EVERY DAY 90 tablet 0  . atorvastatin (LIPITOR) 10 MG tablet TAKE ONE TABLET BY MOUTH EVERY EVENING 90 tablet 2  . carvedilol (COREG) 12.5 MG tablet Take 0.5 tablets (6.26 mg total) by mouth 2 (two) times daily with a meal. 90 tablet 3  . gabapentin (NEURONTIN)  300 MG capsule TAKE ONE CAPSULE BY MOUTH 3 TIMES DAILY 90 capsule 5  . glipiZIDE (GLUCOTROL XL) 10 MG 24 hr tablet TAKE ONE TABLET BY MOUTH TWICE DAILY 180 tablet 1  . levothyroxine (SYNTHROID) 137 MCG tablet TAKE ONE TABLET BY MOUTH EVERY MORNING 90 tablet 1  . metoprolol tartrate (LOPRESSOR) 50 MG tablet Take 1 tablet two hours prior to CT testing on 05/14/20.  You Paul Kent hold your Carvedilol the morning of this test. 1 tablet 0  . testosterone cypionate (DEPOTESTOSTERONE CYPIONATE) 200 MG/ML injection INJECT 1 MILLILITER IN THE MUSCLE EVERY 2 WEEKS 10 mL 1  . XARELTO 20 MG TABS tablet TAKE ONE TABLET BY MOUTH EVERY EVENING 90 tablet 1   Current Facility-Administered Medications  Medication Dose Route Frequency Provider Last Rate Last Admin  . triamcinolone acetonide (KENALOG-40) injection 20 mg  20 mg Intra-articular Once Cox, Kirsten, MD        Allergies:   Propranolol, Clonidine derivatives, Hydralazine hcl, Invokana [canagliflozin], Metformin and related, Other, and Topamax [topiramate]   Social History:  The patient  reports that he has never smoked. He has never used smokeless tobacco. He reports current alcohol use. He reports that he does not use drugs.   Family History:  The patient's  family history includes Arthritis in an other family member; Congestive Heart Failure in an other family member; Diabetes type II in an other family member; Heart Problems in his father; Heart attack in his mother; Hyperlipidemia in an other family member; Transient ischemic attack in his mother; Tuberculosis in his brother.   ROS:  Please see the history of present illness.   All other systems are personally reviewed and negative.    Exam:    Vital Signs:  BP 135/82    no acute distress, no shortness of breath.  Labs/Other Tests and Data Reviewed:    Recent Labs: 09/22/2019: ALT 21; BUN 22; Creatinine, Ser 1.39; Hemoglobin 14.3; Platelets 140; Potassium 5.2; Sodium 141; TSH 3.180   Wt Readings  from Last 3 Encounters:  04/12/20 233 lb (105.7 kg)  02/02/20 238 lb 12.8 oz (108.3 kg)  11/28/19 236 lb (107 kg)     Other studies personally reviewed: Additional studies/ records that were reviewed today include: ECG 04/12/20 personally reviewed Review of the above records today demonstrates: AV paced  Last device remote is reviewed from [facility] PDF dated 03/10/20 which reveals normal device function, 11% AF burden   ASSESSMENT & PLAN:    1. Persistent atrial fibrillation: Currently on Xarelto and amiodarone. CHA2DS2-VASc of 4. He has had a prior cryoablation but has had more frequent episodes of atrial fibrillation. Due to that we'll plan for ablation. Risks and benefits were discussed include bleeding, tamponade, heart block, stroke, damage to chest organs. He understands these risks and has agreed to the procedure.  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  He has had 11% atrial fibrillation burden.  3. Obstructive sleep apnea: CPAP compliance encouraged   COVID 19 screen The patient denies symptoms of COVID 19 at this time.  The importance of social distancing was discussed today.  Follow-up:  3 months  Current medicines are reviewed at length with the patient today.   The patient does not have concerns regarding his medicines.  The following changes were made today:  none  Labs/ tests ordered today include:  No orders of the defined types were placed in this encounter.    Patient Risk:  after full review of this patients clinical status, I feel that they are at moderate risk at this time.     Signed, Shanik Brookshire [name], MD  05/06/2020 3:44 PM     [facility] [address] [place] [address] [phone] (office) [phone] (fax)

## 2020-05-06 NOTE — H&P (View-Only) (Signed)
Electrophysiology TeleHealth Note   Due to national recommendations of social distancing due to COVID 19, an audio/video telehealth visit is felt to be most appropriate for this patient at this time.  See Epic message for the patient's consent to telehealth for [facility].   Date:  05/06/2020   ID:  Paul Kent, DOB [birth year], MRN [id number]  Location: patient's home  Provider location: [address], [place]  Evaluation Performed: Follow-up visit  PCP:  [name], MD  Cardiologist:  No primary care provider on file.  Electrophysiologist:  Dr Curt Bears  Chief Complaint:  AF  History of Present Illness:    Paul Kent is a 79 y.o. male who presents via audio/video conferencing for a telehealth visit today.  Since last being seen in our clinic, the patient reports doing very well.  Today, he denies symptoms of palpitations, chest pain, shortness of breath,  lower extremity edema, dizziness, presyncope, or syncope.  The patient is otherwise without complaint today.  The patient denies symptoms of fevers, chills, cough, or new SOB worrisome for COVID 19.  He has a history of persistent atrial fibrillation, hypertension, hyperlipidemia, sleep apnea, type 2 diabetes, sick sinus syndrome status post Biotronik pacemaker. He had a cryoablation in Jan 2019. Post ablation, he was started on amiodarone. He has had more episodes of atrial fibrillation and is planned for ablation 05/21/20.  Today, denies symptoms of palpitations, chest pain, shortness of breath, orthopnea, PND, lower extremity edema, claudication, dizziness, presyncope, syncope, bleeding, or neurologic sequela. The patient is tolerating medications without difficulties.    Past Medical History:  Diagnosis Date  . Acquired bilateral hammer toes   . Arthralgia of left temporomandibular joint   . Atrial fibrillation ([place]) 05/2016  . Bladder cancer ([place])   . Bradycardia    LOW HEART RATE  . Calculus  of ureter   . Cellulitis of right lower limb   . CHF (congestive heart failure) ([place]) 02/2018  . Chronic atrial fibrillation ([place])   . Corns and callosities   . Disturbances of salivary secretion   . Epistaxis   . Essential hypertension   . Essential tremor   . H pylori ulcer   . Hesitancy of micturition   . Hyperlipidemia   . Hypothyroidism   . Impacted cerumen, bilateral   . Jaw pain   . Localized edema   . Male erectile disorder   . Malignant neoplasm of overlapping sites of bladder ([place])   . Other constipation   . Other fatigue   . Overweight   . Pain in right finger(s)   . Pain in right lower leg   . Peptic ulcer disease   . Primary insomnia   . Renal stones   . Renal stones   . 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 Shingles 06/2012  . Shortness of breath   . Sick sinus syndrome ([place])   . Sleep apnea    dx 08/2015, CPAP  . Spontaneous ecchymoses   . Squamous cell carcinoma of skin    UNSPECIFIED  . Testicular hypofunction   . Type 2 diabetes mellitus ([place]) 1994   UNCONTROLLED, COMPLICATIONS INCLUDE NEPHROPATHY AND PERIPHERAL NEUROPATHY    Past Surgical History:  Procedure Laterality Date  . ABLATION  06/2017  . CARDIOVERSION N/A 03/22/2018   Procedure: CARDIOVERSION;  Surgeon: Buford Dresser, MD;  Location[facility] ENDOSCOPY;  Service: Cardiovascular;  Laterality: N/A;  .  HEMIARTHROPLASTY HIP  10/31/2013   IT HIP BIPOLAR  . LUMBAR SPINE SURGERY    . PACEMAKER PLACEMENT  01/29/2017  . SHOULDER SURGERY Right     Current Outpatient Medications  Medication Sig Dispense Refill  . amiodarone (PACERONE) 200 MG tablet TAKE ONE TABLET BY MOUTH EVERY DAY 90 tablet 0  . atorvastatin (LIPITOR) 10 MG tablet TAKE ONE TABLET BY MOUTH EVERY EVENING 90 tablet 2  . carvedilol (COREG) 12.5 MG tablet Take 0.5 tablets (6.26 mg total) by mouth 2 (two) times daily with a meal. 90 tablet 3  . gabapentin (NEURONTIN)  300 MG capsule TAKE ONE CAPSULE BY MOUTH 3 TIMES DAILY 90 capsule 5  . glipiZIDE (GLUCOTROL XL) 10 MG 24 hr tablet TAKE ONE TABLET BY MOUTH TWICE DAILY 180 tablet 1  . levothyroxine (SYNTHROID) 137 MCG tablet TAKE ONE TABLET BY MOUTH EVERY MORNING 90 tablet 1  . metoprolol tartrate (LOPRESSOR) 50 MG tablet Take 1 tablet two hours prior to CT testing on 05/14/20.  You Leaira Fullam hold your Carvedilol the morning of this test. 1 tablet 0  . testosterone cypionate (DEPOTESTOSTERONE CYPIONATE) 200 MG/ML injection INJECT 1 MILLILITER IN THE MUSCLE EVERY 2 WEEKS 10 mL 1  . XARELTO 20 MG TABS tablet TAKE ONE TABLET BY MOUTH EVERY EVENING 90 tablet 1   Current Facility-Administered Medications  Medication Dose Route Frequency Provider Last Rate Last Admin  . triamcinolone acetonide (KENALOG-40) injection 20 mg  20 mg Intra-articular Once Cox, Kirsten, MD        Allergies:   Propranolol, Clonidine derivatives, Hydralazine hcl, Invokana [canagliflozin], Metformin and related, Other, and Topamax [topiramate]   Social History:  The patient  reports that he has never smoked. He has never used smokeless tobacco. He reports current alcohol use. He reports that he does not use drugs.   Family History:  The patient's  family history includes Arthritis in an other family member; Congestive Heart Failure in an other family member; Diabetes type II in an other family member; Heart Problems in his father; Heart attack in his mother; Hyperlipidemia in an other family member; Transient ischemic attack in his mother; Tuberculosis in his brother.   ROS:  Please see the history of present illness.   All other systems are personally reviewed and negative.    Exam:    Vital Signs:  BP 135/82    no acute distress, no shortness of breath.  Labs/Other Tests and Data Reviewed:    Recent Labs: 09/22/2019: ALT 21; BUN 22; Creatinine, Ser 1.39; Hemoglobin 14.3; Platelets 140; Potassium 5.2; Sodium 141; TSH 3.180   Wt Readings  from Last 3 Encounters:  04/12/20 233 lb (105.7 kg)  02/02/20 238 lb 12.8 oz (108.3 kg)  11/28/19 236 lb (107 kg)     Other studies personally reviewed: Additional studies/ records that were reviewed today include: ECG 04/12/20 personally reviewed Review of the above records today demonstrates: AV paced  Last device remote is reviewed from [facility] [place] PDF dated 03/10/20 which reveals normal device function, 11% AF burden   ASSESSMENT & PLAN:    1. Persistent atrial fibrillation: Currently on Xarelto and amiodarone. CHA2DS2-VASc of 4. He has had a prior cryoablation but has had more frequent episodes of atrial fibrillation. Due to that we'll plan for ablation. Risks and benefits were discussed include bleeding, tamponade, heart block, stroke, damage to chest organs. He understands these risks and has agreed to the procedure.  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  He has had 11% atrial fibrillation burden.  3. Obstructive sleep apnea: CPAP compliance encouraged   COVID 19 screen The patient denies symptoms of COVID 19 at this time.  The importance of social distancing was discussed today.  Follow-up:  3 months  Current medicines are reviewed at length with the patient today.   The patient does not have concerns regarding his medicines.  The following changes were made today:  none  Labs/ tests ordered today include:  No orders of the defined types were placed in this encounter.    Patient Risk:  after full review of this patients clinical status, I feel that they are at moderate risk at this time.     Signed, Ernesto Zukowski [name], MD  05/06/2020 3:44 PM     [facility] [address] [place] [address] [phone] (office) [phone] (fax)

## 2020-05-11 ENCOUNTER — Telehealth (HOSPITAL_COMMUNITY): Payer: Self-pay | Admitting: Emergency Medicine

## 2020-05-11 NOTE — Telephone Encounter (Signed)
Attempted to call patient regarding upcoming cardiac CT appointment. °Left message on voicemail with name and callback number °Makila Colombe RN Navigator Cardiac Imaging °[facility] °[phone] Office °[phone] Cell ° °

## 2020-05-11 NOTE — Telephone Encounter (Signed)
Pt returning phone call regarding upcoming cardiac imaging study; pt verbalizes understanding of appt date/time, parking situation and where to check in, pre-test NPO status and medications ordered, and verified current allergies; name and call back number provided for further questions should they arise Marchia Bond RN Navigator Cardiac Imaging [facility] [phone] office [phone] cell  Reinforced that patient will take metoprolol 2 hr prior to scan and NOT carvedilol  [employer]

## 2020-05-14 ENCOUNTER — Ambulatory Visit (HOSPITAL_COMMUNITY)
Admission: RE | Admit: 2020-05-14 | Discharge: 2020-05-14 | Disposition: A | Discharge status: Home / Self Care | Payer: Medicare Other | Source: Ambulatory Visit | Attending: Cardiology | Admitting: Cardiology

## 2020-05-14 ENCOUNTER — Ambulatory Visit (HOSPITAL_COMMUNITY): Payer: Medicare Other

## 2020-05-14 ENCOUNTER — Inpatient Hospital Stay (HOSPITAL_COMMUNITY)
Admission: RE | Admit: 2020-05-14 | Discharge: 2020-05-14 | Disposition: A | Discharge status: Home / Self Care | Payer: Medicare Other | Source: Ambulatory Visit | Attending: Cardiology | Admitting: Cardiology

## 2020-05-14 ENCOUNTER — Other Ambulatory Visit: Payer: Self-pay

## 2020-05-14 DIAGNOSIS — I480 Paroxysmal atrial fibrillation: Secondary | ICD-10-CM | POA: Insufficient documentation

## 2020-05-14 MED ORDER — IOHEXOL 350 MG/ML SOLN
80.0000 mL | Freq: Once | INTRAVENOUS | Status: AC | PRN
  Administered 2020-05-14: 80 mL via INTRAVENOUS

## 2020-05-14 NOTE — Progress Notes (Signed)
When patient did not arrive to [facility] by 0830 for 0800 appointment for IV fluid prior to contrast called radiology looking for pt. Per front desk pt was in [place], charge RN would call back. No call back by 0845 so called charge number. Per [facility] checked labs and saw no orders or reason patient needed IV fluid.

## 2020-05-19 ENCOUNTER — Other Ambulatory Visit (HOSPITAL_COMMUNITY)
Admission: RE | Admit: 2020-05-19 | Discharge: 2020-05-19 | Disposition: A | Discharge status: Home / Self Care | Payer: Medicare Other | Source: Ambulatory Visit | Attending: Cardiology | Admitting: Cardiology

## 2020-05-19 DIAGNOSIS — Z01812 Encounter for preprocedural laboratory examination: Secondary | ICD-10-CM | POA: Insufficient documentation

## 2020-05-19 DIAGNOSIS — Z20822 Contact with and (suspected) exposure to covid-19: Secondary | ICD-10-CM | POA: Diagnosis not present

## 2020-05-19 LAB — SARS CORONAVIRUS 2 (TAT 6-24 HRS): SARS Coronavirus 2: NEGATIVE

## 2020-05-20 NOTE — Progress Notes (Signed)
Instructed patient on the following items: Arrival time 0530 Nothing to eat or drink after midnight No meds AM of procedure Responsible person to drive you home and stay with you for 24 hrs  Have you missed any doses of anti-coagulant Xarelto- hasn't missed any doses    

## 2020-05-20 NOTE — Anesthesia Preprocedure Evaluation (Addendum)
Anesthesia Evaluation  Patient identified by MRN, date of birth, ID band Patient awake    Reviewed: Allergy & Precautions, NPO status , Patient's Chart, lab work & pertinent test results, reviewed documented beta blocker date and time   History of Anesthesia Complications Negative for: history of anesthetic complications  Airway Mallampati: II  TM Distance: >3 FB Neck ROM: Full    Dental  (+) Dental Advisory Given, Caps, Chipped   Pulmonary sleep apnea and Continuous Positive Airway Pressure Ventilation ,  05/19/2020 SARS coronavirus NEG   breath sounds clear to auscultation       Cardiovascular hypertension, Pt. on medications and Pt. on home beta blockers (-) angina+ dysrhythmias Atrial Fibrillation  Rhythm:Irregular Rate:Normal  '19 ECHO: EF 45-50%, mildly reduced LV function, no significant valvular abnormalities   Neuro/Psych negative neurological ROS     GI/Hepatic Neg liver ROS, PUD,   Endo/Other  diabetes (glu 112), Oral Hypoglycemic AgentsHypothyroidism   Renal/GU H/o stones   Bladder cancer    Musculoskeletal   Abdominal   Peds  Hematology   Anesthesia Other Findings   Reproductive/Obstetrics                           Anesthesia Physical Anesthesia Plan  ASA: III  Anesthesia Plan: General   Post-op Pain Management:    Induction: Intravenous  PONV Risk Score and Plan: 2 and Ondansetron and Dexamethasone  Airway Management Planned: Oral ETT  Additional Equipment: None  Intra-op Plan:   Post-operative Plan: Extubation in OR  Informed Consent: I have reviewed the patients History and Physical, chart, labs and discussed the procedure including the risks, benefits and alternatives for the proposed anesthesia with the patient or authorized representative who has indicated his/her understanding and acceptance.     Dental advisory given  Plan Discussed with: CRNA and  Surgeon  Anesthesia Plan Comments:        Anesthesia Quick Evaluation

## 2020-05-21 ENCOUNTER — Encounter (HOSPITAL_COMMUNITY): Payer: Self-pay | Admitting: Cardiology

## 2020-05-21 ENCOUNTER — Other Ambulatory Visit: Payer: Self-pay

## 2020-05-21 ENCOUNTER — Ambulatory Visit (HOSPITAL_COMMUNITY)
Admission: RE | Admit: 2020-05-21 | Discharge: 2020-05-21 | Disposition: A | Discharge status: Home / Self Care | Payer: Medicare Other | Attending: Cardiology | Admitting: Cardiology

## 2020-05-21 ENCOUNTER — Ambulatory Visit (HOSPITAL_COMMUNITY): Payer: Medicare Other | Admitting: Anesthesiology

## 2020-05-21 ENCOUNTER — Encounter (HOSPITAL_COMMUNITY)
Admission: RE | Disposition: A | Discharge status: Home / Self Care | Payer: Self-pay | Source: Home / Self Care | Attending: Cardiology

## 2020-05-21 DIAGNOSIS — Z7901 Long term (current) use of anticoagulants: Secondary | ICD-10-CM | POA: Diagnosis not present

## 2020-05-21 DIAGNOSIS — Z8249 Family history of ischemic heart disease and other diseases of the circulatory system: Secondary | ICD-10-CM | POA: Insufficient documentation

## 2020-05-21 DIAGNOSIS — E1159 Type 2 diabetes mellitus with other circulatory complications: Secondary | ICD-10-CM | POA: Diagnosis not present

## 2020-05-21 DIAGNOSIS — Z950 Presence of cardiac pacemaker: Secondary | ICD-10-CM | POA: Insufficient documentation

## 2020-05-21 DIAGNOSIS — G4733 Obstructive sleep apnea (adult) (pediatric): Secondary | ICD-10-CM | POA: Insufficient documentation

## 2020-05-21 DIAGNOSIS — E039 Hypothyroidism, unspecified: Secondary | ICD-10-CM | POA: Diagnosis not present

## 2020-05-21 DIAGNOSIS — Z79899 Other long term (current) drug therapy: Secondary | ICD-10-CM | POA: Diagnosis not present

## 2020-05-21 DIAGNOSIS — Z888 Allergy status to other drugs, medicaments and biological substances status: Secondary | ICD-10-CM | POA: Insufficient documentation

## 2020-05-21 DIAGNOSIS — I10 Essential (primary) hypertension: Secondary | ICD-10-CM | POA: Diagnosis not present

## 2020-05-21 DIAGNOSIS — Z7984 Long term (current) use of oral hypoglycemic drugs: Secondary | ICD-10-CM | POA: Insufficient documentation

## 2020-05-21 DIAGNOSIS — I495 Sick sinus syndrome: Secondary | ICD-10-CM | POA: Diagnosis not present

## 2020-05-21 DIAGNOSIS — I480 Paroxysmal atrial fibrillation: Secondary | ICD-10-CM | POA: Insufficient documentation

## 2020-05-21 HISTORY — PX: ATRIAL FIBRILLATION ABLATION: EP1191

## 2020-05-21 LAB — GLUCOSE, CAPILLARY
Glucose-Capillary: 112 mg/dL — ABNORMAL HIGH (ref 70–99)
Glucose-Capillary: 119 mg/dL — ABNORMAL HIGH (ref 70–99)

## 2020-05-21 LAB — POCT ACTIVATED CLOTTING TIME
Activated Clotting Time: 345 seconds
Activated Clotting Time: 338 seconds

## 2020-05-21 SURGERY — ATRIAL FIBRILLATION ABLATION
Anesthesia: General

## 2020-05-21 MED ORDER — HEPARIN SODIUM (PORCINE) 1000 UNIT/ML IJ SOLN
INTRAMUSCULAR | Status: DC | PRN
  Administered 2020-05-21: 15000 [IU] via INTRAVENOUS
  Administered 2020-05-21: 1000 [IU] via INTRAVENOUS

## 2020-05-21 MED ORDER — LIDOCAINE 2% (20 MG/ML) 5 ML SYRINGE
INTRAMUSCULAR | Status: DC | PRN
  Administered 2020-05-21: 20 mg via INTRAVENOUS

## 2020-05-21 MED ORDER — ACETAMINOPHEN 500 MG PO TABS
1000.0000 mg | ORAL_TABLET | Freq: Once | ORAL | Status: AC
  Administered 2020-05-21: 1000 mg via ORAL
  Filled 2020-05-21: qty 2

## 2020-05-21 MED ORDER — ACETAMINOPHEN 325 MG PO TABS
650.0000 mg | ORAL_TABLET | ORAL | Status: DC | PRN
  Filled 2020-05-21: qty 2

## 2020-05-21 MED ORDER — ROCURONIUM BROMIDE 10 MG/ML (PF) SYRINGE
PREFILLED_SYRINGE | INTRAVENOUS | Status: DC | PRN
  Administered 2020-05-21: 60 mg via INTRAVENOUS

## 2020-05-21 MED ORDER — SUGAMMADEX SODIUM 200 MG/2ML IV SOLN
INTRAVENOUS | Status: DC | PRN
  Administered 2020-05-21: 200 mg via INTRAVENOUS

## 2020-05-21 MED ORDER — PROTAMINE SULFATE 10 MG/ML IV SOLN
INTRAVENOUS | Status: DC | PRN
  Administered 2020-05-21: 40 mg via INTRAVENOUS

## 2020-05-21 MED ORDER — DOBUTAMINE IN D5W 4-5 MG/ML-% IV SOLN
INTRAVENOUS | Status: DC | PRN
  Administered 2020-05-21: 20 ug/kg/min via INTRAVENOUS

## 2020-05-21 MED ORDER — ONDANSETRON HCL 4 MG/2ML IJ SOLN
INTRAMUSCULAR | Status: AC
  Filled 2020-05-21: qty 2

## 2020-05-21 MED ORDER — SODIUM CHLORIDE 0.9 % IV SOLN: 250.0000 mL | INTRAVENOUS | Status: DC | PRN

## 2020-05-21 MED ORDER — HEPARIN (PORCINE) IN NACL 1000-0.9 UT/500ML-% IV SOLN
INTRAVENOUS | Status: DC | PRN
  Administered 2020-05-21 (×5): 500 mL

## 2020-05-21 MED ORDER — SODIUM CHLORIDE 0.9 % IV SOLN: INTRAVENOUS | Status: DC

## 2020-05-21 MED ORDER — CEFAZOLIN SODIUM-DEXTROSE 2-4 GM/100ML-% IV SOLN
INTRAVENOUS | Status: AC
  Filled 2020-05-21: qty 100

## 2020-05-21 MED ORDER — PROPOFOL 10 MG/ML IV BOLUS
INTRAVENOUS | Status: DC | PRN
  Administered 2020-05-21: 50 mg via INTRAVENOUS
  Administered 2020-05-21: 100 mg via INTRAVENOUS
  Administered 2020-05-21: 50 mg via INTRAVENOUS

## 2020-05-21 MED ORDER — SODIUM CHLORIDE 0.9% FLUSH: 3.0000 mL | Freq: Two times a day (BID) | INTRAVENOUS | Status: DC

## 2020-05-21 MED ORDER — ONDANSETRON HCL 4 MG/2ML IJ SOLN
4.0000 mg | Freq: Four times a day (QID) | INTRAMUSCULAR | Status: DC | PRN
  Administered 2020-05-21: 4 mg via INTRAVENOUS

## 2020-05-21 MED ORDER — FENTANYL CITRATE (PF) 100 MCG/2ML IJ SOLN
INTRAMUSCULAR | Status: DC | PRN
Start: 2020-05-21 — End: 2020-05-21
  Administered 2020-05-21: 50 ug via INTRAVENOUS

## 2020-05-21 MED ORDER — HEPARIN SODIUM (PORCINE) 1000 UNIT/ML IJ SOLN
INTRAMUSCULAR | Status: AC
  Filled 2020-05-21: qty 1

## 2020-05-21 MED ORDER — ADENOSINE 6 MG/2ML IV SOLN
INTRAVENOUS | Status: DC | PRN
  Administered 2020-05-21: 12 mg via INTRAVENOUS

## 2020-05-21 MED ORDER — DOBUTAMINE IN D5W 4-5 MG/ML-% IV SOLN
INTRAVENOUS | Status: AC
  Filled 2020-05-21: qty 250

## 2020-05-21 MED ORDER — ADENOSINE 6 MG/2ML IV SOLN
INTRAVENOUS | Status: AC
  Filled 2020-05-21: qty 2

## 2020-05-21 MED ORDER — DEXAMETHASONE SODIUM PHOSPHATE 10 MG/ML IJ SOLN
INTRAMUSCULAR | Status: DC | PRN
  Administered 2020-05-21: 4 mg via INTRAVENOUS

## 2020-05-21 MED ORDER — HEPARIN SODIUM (PORCINE) 1000 UNIT/ML IJ SOLN
INTRAMUSCULAR | Status: DC | PRN
  Administered 2020-05-21: 1000 [IU] via INTRAVENOUS

## 2020-05-21 MED ORDER — SODIUM CHLORIDE 0.9% FLUSH: 3.0000 mL | INTRAVENOUS | Status: DC | PRN

## 2020-05-21 MED ORDER — ONDANSETRON HCL 4 MG/2ML IJ SOLN
INTRAMUSCULAR | Status: DC | PRN
  Administered 2020-05-21: 4 mg via INTRAVENOUS

## 2020-05-21 MED ORDER — PHENYLEPHRINE HCL-NACL 10-0.9 MG/250ML-% IV SOLN
INTRAVENOUS | Status: DC | PRN
  Administered 2020-05-21: 15 ug/min via INTRAVENOUS

## 2020-05-21 MED ORDER — CEFAZOLIN SODIUM-DEXTROSE 2-3 GM-%(50ML) IV SOLR
INTRAVENOUS | Status: DC | PRN
  Administered 2020-05-21: 2 g via INTRAVENOUS

## 2020-05-21 MED ORDER — HEPARIN (PORCINE) IN NACL 1000-0.9 UT/500ML-% IV SOLN
INTRAVENOUS | Status: AC
  Filled 2020-05-21: qty 1000

## 2020-05-21 SURGICAL SUPPLY — 22 items
BAG SNAP BAND KOVER 36X36 (MISCELLANEOUS) ×2 IMPLANT
BLANKET WARM UNDERBOD FULL ACC (MISCELLANEOUS) ×2 IMPLANT
CATH MAPPNG PENTARAY F 2-6-2MM (CATHETERS) ×1 IMPLANT
CATH S CIRCA THERM PROBE 10F (CATHETERS) ×2 IMPLANT
CATH SMTCH THERMOCOOL SF DF (CATHETERS) ×2 IMPLANT
CATH SOUNDSTAR ECO 8FR (CATHETERS) ×2 IMPLANT
CATH WEB BI DIR CSDF CRV REPRO (CATHETERS) ×2 IMPLANT
CLOSURE PERCLOSE PROSTYLE (VASCULAR PRODUCTS) ×8 IMPLANT
COVER SWIFTLINK CONNECTOR (BAG) ×2 IMPLANT
KIT VERSACROSS STEERABLE D1 (CATHETERS) ×2 IMPLANT
MAT PREVALON FULL STRYKER (MISCELLANEOUS) ×2 IMPLANT
PACK EP LATEX FREE (CUSTOM PROCEDURE TRAY) ×2
PACK EP LF (CUSTOM PROCEDURE TRAY) ×1 IMPLANT
PAD PRO RADIOLUCENT 2001M-C (PAD) ×2 IMPLANT
PATCH CARTO3 (PAD) ×2 IMPLANT
PENTARAY F 2-6-2MM (CATHETERS) ×2
SHEATH CARTO VIZIGO SM CVD (SHEATH) ×2 IMPLANT
SHEATH PINNACLE 7F 10CM (SHEATH) ×2 IMPLANT
SHEATH PINNACLE 8F 10CM (SHEATH) ×4 IMPLANT
SHEATH PINNACLE 9F 10CM (SHEATH) ×2 IMPLANT
SHEATH PROBE COVER 6X72 (BAG) ×2 IMPLANT
TUBING SMART ABLATE COOLFLOW (TUBING) ×2 IMPLANT

## 2020-05-21 NOTE — Anesthesia Procedure Notes (Signed)
Procedure Name: Intubation Date/Time: 05/21/2020 7:42 AM Performed by: Lowella Dell, CRNA Pre-anesthesia Checklist: Patient identified, Emergency Drugs available, Suction available and Patient being monitored Patient Re-evaluated:Patient Re-evaluated prior to induction Oxygen Delivery Method: Circle System Utilized Preoxygenation: Pre-oxygenation with 100% oxygen Induction Type: IV induction Ventilation: Mask ventilation without difficulty Laryngoscope Size: Mac and 4 Grade View: Grade I Tube type: Oral Tube size: 7.5 mm Number of attempts: 1 Airway Equipment and Method: Stylet Placement Confirmation: ETT inserted through vocal cords under direct vision,  positive ETCO2 and breath sounds checked- equal and bilateral Secured at: 24 cm Tube secured with: Tape Dental Injury: Teeth and Oropharynx as per pre-operative assessment

## 2020-05-21 NOTE — Anesthesia Postprocedure Evaluation (Signed)
Anesthesia Post Note  Patient: ARHAN MCMANAMON  Procedure(s) Performed: ATRIAL FIBRILLATION ABLATION (N/A )     Patient location during evaluation: Phase II Anesthesia Type: General Level of consciousness: awake and alert, patient cooperative and oriented Pain management: pain level controlled Vital Signs Assessment: post-procedure vital signs reviewed and stable Respiratory status: spontaneous breathing, nonlabored ventilation and respiratory function stable Cardiovascular status: blood pressure returned to baseline and stable Postop Assessment: no apparent nausea or vomiting and adequate PO intake Anesthetic complications: no   No complications documented.  Last Vitals:  Vitals:   05/21/20 1322 05/21/20 1341  BP: (!) 162/84 138/62  Pulse: 69 65  Resp: 15 (!) 22  Temp:    SpO2: 99% 100%    Last Pain:  Vitals:   05/21/20 1105  TempSrc:   PainSc: 0-No pain                 Rilley Poulter,E. Alisha Bacus

## 2020-05-21 NOTE — Discharge Instructions (Signed)
Post procedure care instructions No driving for 4 days. No lifting over 5 lbs for 1 week. No vigorous or sexual activity for 1 week. You may return to work/your usual activities on 05/28/2020. Keep procedure site clean & dry. If you notice increased pain, swelling, bleeding or pus, call/return!  You may shower after 24 hours, but no soaking in baths/hot tubs/pools for 1 week.    You have an appointment set up with the [facility].  Multiple studies have shown that being followed by a dedicated atrial fibrillation clinic in addition to the standard care you receive from your other physicians improves health. We believe that enrollment in the atrial fibrillation clinic will allow [place] to better care for you.   The phone number to the [facility] is [phone]. The clinic is staffed Monday through Friday from 8:30am to 5pm.  Parking Directions: The clinic is located in the Heart and Vascular Building connected to [facility]. 1)From [address] turn on to [address] and go to the 3rd entrance  (Heart and Vascular entrance) on the right. 2)Look to the right for Heart &Vascular Parking Garage. 3)A code for the entrance is required, for January is 1111.   4)Take the elevators to the 1st floor. Registration is in the room with the glass walls at the end of the hallway.  If you have any trouble parking or locating the clinic, please don't hesitate to call [phone].    Cardiac Ablation, Care After  This sheet gives you information about how to care for yourself after your procedure. Your health care provider may also give you more specific instructions. If you have problems or questions, contact your health care provider. What can I expect after the procedure? After the procedure, it is common to have:  Bruising around your puncture site.  Tenderness around your puncture site.  Skipped heartbeats.  Tiredness (fatigue).  Follow these  instructions at home: Puncture site care   Follow instructions from your health care provider about how to take care of your puncture site. Make sure you: ? If present, leave stitches (sutures), skin glue, or adhesive strips in place. These skin closures may need to stay in place for up to 2 weeks. If adhesive strip edges start to loosen and curl up, you may trim the loose edges. Do not remove adhesive strips completely unless your health care provider tells you to do that. ? If a large square bandage is present, this may be removed 24 hours after surgery.   Check your puncture site every day for signs of infection. Check for: ? Redness, swelling, or pain. ? Fluid or blood. If your puncture site starts to bleed, lie down on your back, apply firm pressure to the area, and contact your health care provider. ? Warmth. ? Pus or a bad smell. Driving  Do not drive for at least 4 days after your procedure or however long your health care provider recommends. (Do not resume driving if you have previously been instructed not to drive for other health reasons.)  Do not drive or use heavy machinery while taking prescription pain medicine. Activity  Avoid activities that take a lot of effort for at least 7 days after your procedure.  Do not lift anything that is heavier than 5 lb (4.5 kg) for one week.   No sexual activity for 1 week.   Return to your normal activities as told by your health care provider. Ask your health care provider what  activities are safe for you. General instructions  Take over-the-counter and prescription medicines only as told by your health care provider.  Do not use any products that contain nicotine or tobacco, such as cigarettes and e-cigarettes. If you need help quitting, ask your health care provider.  You may shower after 24 hours, but Do not take baths, swim, or use a hot tub for 1 week.   Do not drink alcohol for 24 hours after your procedure.  Keep all  follow-up visits as told by your health care provider. This is important. Contact a health care provider if:  You have redness, mild swelling, or pain around your puncture site.  You have fluid or blood coming from your puncture site that stops after applying firm pressure to the area.  Your puncture site feels warm to the touch.  You have pus or a bad smell coming from your puncture site.  You have a fever.  You have chest pain or discomfort that spreads to your neck, jaw, or arm.  You are sweating a lot.  You feel nauseous.  You have a fast or irregular heartbeat.  You have shortness of breath.  You are dizzy or light-headed and feel the need to lie down.  You have pain or numbness in the arm or leg closest to your puncture site. Get help right away if:  Your puncture site suddenly swells.  Your puncture site is bleeding and the bleeding does not stop after applying firm pressure to the area. These symptoms may represent a serious problem that is an emergency. Do not wait to see if the symptoms will go away. Get medical help right away. Call your local emergency services (911 in the [place] Do not drive yourself to the hospital. Summary  After the procedure, it is normal to have bruising and tenderness at the puncture site in your groin, neck, or forearm.  Check your puncture site every day for signs of infection.  Get help right away if your puncture site is bleeding and the bleeding does not stop after applying firm pressure to the area. This is a medical emergency. This information is not intended to replace advice given to you by your health care provider. Make sure you discuss any questions you have with your health care provider.

## 2020-05-21 NOTE — Interval H&P Note (Signed)
History and Physical Interval Note:  05/21/2020 7:06 AM  Paul Kent  has presented today for surgery, with the diagnosis of Atrial fibrillation.  The various methods of treatment have been discussed with the patient and family. After consideration of risks, benefits and other options for treatment, the patient has consented to  Procedure(s): ATRIAL FIBRILLATION ABLATION (N/A) as a surgical intervention.  The patient's history has been reviewed, patient examined, no change in status, stable for surgery.  I have reviewed the patient's chart and labs.  Questions were answered to the patient's satisfaction.     Delaila Nand [employer]

## 2020-05-21 NOTE — Transfer of Care (Signed)
Immediate Anesthesia Transfer of Care Note  Patient: Paul Kent  Procedure(s) Performed: ATRIAL FIBRILLATION ABLATION (N/A )  Patient Location: PACU and Cath Lab  Anesthesia Type:General  Level of Consciousness: awake and patient cooperative  Airway & Oxygen Therapy: Patient Spontanous Breathing and Patient connected to nasal cannula oxygen  Post-op Assessment: Report given to RN and Post -op Vital signs reviewed and stable  Post vital signs: Reviewed and stable  Last Vitals:  Vitals Value Taken Time  BP 158/90 05/21/20 1016  Temp 36.1 C 05/21/20 1016  Pulse 65 05/21/20 1022  Resp 14 05/21/20 1022  SpO2 100 % 05/21/20 1022  Vitals shown include unvalidated device data.  Last Pain:  Vitals:   05/21/20 1016  TempSrc: Temporal  PainSc: Asleep      Patients Stated Pain Goal: 5 (61/90/12 2241)  Complications: No complications documented.

## 2020-05-24 ENCOUNTER — Encounter (HOSPITAL_COMMUNITY): Payer: Self-pay | Admitting: Cardiology

## 2020-06-03 ENCOUNTER — Telehealth: Payer: Self-pay

## 2020-06-03 NOTE — Telephone Encounter (Signed)
Patient is returning Elizabeth's call. Please advise

## 2020-06-03 NOTE — Telephone Encounter (Signed)
Following up with patient. Per Dr. Ileana Ladd, he would like for patient to follow up with AF Clinic (06/25/2020) and we will continue to monitor.   Attempted to call patient to discuss. No answer, LMOVM.

## 2020-06-03 NOTE — Telephone Encounter (Signed)
Biotronik alert received for AF above limit. Patient had AF ablation on 05/21/20. AT/AF burden 100% of day. Patient reports he feels fine, has been out walking this morning. Reports compliance with coreg 6.26 mg BID and xarelto 20 mg daily. Patient reports he stopped Amiodarone 200 mg daily post procedure.   Advised patient I will follow up with Dr. Curt Bears who is in the offioce today and I will call him with a follow up. Patient agreeable to plan.

## 2020-06-03 NOTE — Telephone Encounter (Signed)
Returned patients phone call. Advised to details below.Verbalized understanding.

## 2020-06-07 ENCOUNTER — Other Ambulatory Visit: Payer: Self-pay | Admitting: Family Medicine

## 2020-06-10 ENCOUNTER — Ambulatory Visit (INDEPENDENT_AMBULATORY_CARE_PROVIDER_SITE_OTHER): Payer: Medicare Other

## 2020-06-10 DIAGNOSIS — I4819 Other persistent atrial fibrillation: Secondary | ICD-10-CM | POA: Diagnosis not present

## 2020-06-13 LAB — CUP PACEART REMOTE DEVICE CHECK
Date Time Interrogation Session: 20211222082227
Implantable Lead Implant Date: 20180813
Implantable Lead Implant Date: 20180813
Implantable Lead Location: 753859
Implantable Lead Location: 753860
Implantable Lead Model: 377
Implantable Lead Model: 377
Implantable Lead Serial Number: 49893169
Implantable Lead Serial Number: 50011411
Implantable Pulse Generator Implant Date: 20180813
Pulse Gen Model: 407145
Pulse Gen Serial Number: 69158272

## 2020-06-15 ENCOUNTER — Telehealth (HOSPITAL_COMMUNITY): Payer: Self-pay | Admitting: *Deleted

## 2020-06-15 NOTE — Telephone Encounter (Signed)
Left message for pt to return call.

## 2020-06-15 NOTE — Telephone Encounter (Signed)
-----   Message from Baird Lyons, RN sent at 06/15/2020  8:04 AM EST ----- Per Dr. Elberta Fortis pt needs sooner appt due to persistent AFib on PPM transmission. Thx  ----- Message ----- From: Regan Lemming, MD Sent: 06/14/2020  10:27 AM EST To: Baird Lyons, RN  Needs AF clinic follow up moved up.

## 2020-06-17 ENCOUNTER — Ambulatory Visit (HOSPITAL_COMMUNITY)
Admission: RE | Admit: 2020-06-17 | Discharge: 2020-06-17 | Disposition: A | Discharge status: Home / Self Care | Payer: Medicare Other | Source: Ambulatory Visit | Attending: Physician Assistant | Admitting: Physician Assistant

## 2020-06-17 ENCOUNTER — Encounter (HOSPITAL_COMMUNITY): Payer: Self-pay | Admitting: Physician Assistant

## 2020-06-17 ENCOUNTER — Other Ambulatory Visit: Payer: Self-pay

## 2020-06-17 VITALS — BP 134/68 | HR 61 | Ht 75.0 in | Wt 251.4 lb

## 2020-06-17 DIAGNOSIS — I495 Sick sinus syndrome: Secondary | ICD-10-CM | POA: Diagnosis not present

## 2020-06-17 DIAGNOSIS — E785 Hyperlipidemia, unspecified: Secondary | ICD-10-CM | POA: Insufficient documentation

## 2020-06-17 DIAGNOSIS — D6869 Other thrombophilia: Secondary | ICD-10-CM

## 2020-06-17 DIAGNOSIS — Z6831 Body mass index (BMI) 31.0-31.9, adult: Secondary | ICD-10-CM | POA: Insufficient documentation

## 2020-06-17 DIAGNOSIS — G4733 Obstructive sleep apnea (adult) (pediatric): Secondary | ICD-10-CM | POA: Diagnosis not present

## 2020-06-17 DIAGNOSIS — I4819 Other persistent atrial fibrillation: Secondary | ICD-10-CM

## 2020-06-17 DIAGNOSIS — E669 Obesity, unspecified: Secondary | ICD-10-CM | POA: Insufficient documentation

## 2020-06-17 DIAGNOSIS — Z7901 Long term (current) use of anticoagulants: Secondary | ICD-10-CM | POA: Insufficient documentation

## 2020-06-17 DIAGNOSIS — I10 Essential (primary) hypertension: Secondary | ICD-10-CM | POA: Insufficient documentation

## 2020-06-17 HISTORY — DX: Other thrombophilia: D68.69

## 2020-06-17 MED ORDER — AMIODARONE HCL 200 MG PO TABS
200.0000 mg | ORAL_TABLET | Freq: Every day | ORAL | 0 refills | Status: DC
Start: 2020-06-17 — End: 2020-08-04

## 2020-06-17 NOTE — Progress Notes (Addendum)
Primary Care Physician: [name], MD Primary Electrophysiologist: Dr Curt Bears Referring Physician: Dr Lenetta Quaker Paul Kent is a 79 y.o. male with a history of HTN, HLD, OSA, DM, hypothyroid, SSS s/p PPM, and atrial fibrillation who presents for follow up in the [facility]. Patient is s/p cryo ablation by Dr Phillis Haggis in 2019. He was changed from flecainide to amiodarone. Patient is on Xarelto for a CHADS2VASC score of 4. He underwent RF afib ablation with Dr Curt Bears on 05/21/20. Patient stopped his amiodarone on his own at that time. The device clinic received an alert on 06/03/20 that he was persistently in afib. He does have some increased dyspnea with exertion and increased lower extremity edema.   Today, he denies symptoms of palpitations, chest pain, orthopnea, PND, lower extremity edema, dizziness, presyncope, syncope, bleeding, or neurologic sequela. The patient is tolerating medications without difficulties and is otherwise without complaint today.    Atrial Fibrillation Risk Factors:  he does have symptoms or diagnosis of sleep apnea. he is compliant with CPAP therapy. he does not have a history of rheumatic fever.   he has a BMI of Body mass index is 31.42 kg/m.Marland Kitchen Filed Weights   06/17/20 1414  Weight: 114 kg    Family History  Problem Relation Age of Onset  . Heart attack Mother   . Transient ischemic attack Mother   . Heart Problems Father   . Tuberculosis Brother   . Diabetes type II Other   . Hyperlipidemia Other   . Congestive Heart Failure Other   . Arthritis Other      Atrial Fibrillation Management history:  Previous antiarrhythmic drugs: flecainide, amiodarone Previous cardioversions: 03/22/18 Previous ablations: cryo 2019, 05/21/20 CHADS2VASC score: 4 Anticoagulation history: Xarelto   Past Medical History:  Diagnosis Date  . Acquired bilateral hammer toes   . Arthralgia of left temporomandibular joint   .  Atrial fibrillation ([place]) 05/2016  . Bladder cancer ([place])   . Bradycardia    LOW HEART RATE  . Calculus of ureter   . Cellulitis of right lower limb   . CHF (congestive heart failure) ([place]) 02/2018  . Chronic atrial fibrillation ([place])   . Corns and callosities   . Disturbances of salivary secretion   . Epistaxis   . Essential hypertension   . Essential tremor   . H pylori ulcer   . Hesitancy of micturition   . Hyperlipidemia   . Hypothyroidism   . Impacted cerumen, bilateral   . Jaw pain   . Localized edema   . Male erectile disorder   . Malignant neoplasm of overlapping sites of bladder ([place])   . Other constipation   . Other fatigue   . Overweight   . Pain in right finger(s)   . Pain in right lower leg   . Peptic ulcer disease   . Primary insomnia   . Renal stones   . Renal stones   . 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 Shingles 06/2012  . Shortness of breath   . Sick sinus syndrome ([place])   . Sleep apnea    dx 08/2015, CPAP  . Spontaneous ecchymoses   . Squamous cell carcinoma of skin    UNSPECIFIED  . Testicular hypofunction   . Type 2 diabetes mellitus ([place]) 1994   UNCONTROLLED, COMPLICATIONS INCLUDE NEPHROPATHY AND PERIPHERAL NEUROPATHY   Past Surgical History:  Procedure Laterality Date  .  ABLATION  06/2017  . ATRIAL FIBRILLATION ABLATION N/A 05/21/2020   Procedure: ATRIAL FIBRILLATION ABLATION;  Surgeon: Constance Haw, MD;  Location: [facility];  Service: Cardiovascular;  Laterality: N/A;  . CARDIOVERSION N/A 03/22/2018   Procedure: CARDIOVERSION;  Surgeon: Buford Dresser, MD;  Location: [facility] ENDOSCOPY;  Service: Cardiovascular;  Laterality: N/A;  . HEMIARTHROPLASTY HIP  10/31/2013   IT HIP BIPOLAR  . LUMBAR SPINE SURGERY    . PACEMAKER PLACEMENT  01/29/2017  . SHOULDER SURGERY Right     Current Outpatient Medications  Medication Sig Dispense Refill  . atorvastatin  (LIPITOR) 10 MG tablet TAKE ONE TABLET BY MOUTH EVERY EVENING 90 tablet 2  . carvedilol (COREG) 12.5 MG tablet Take 0.5 tablets (6.26 mg total) by mouth 2 (two) times daily with a meal. 90 tablet 3  . gabapentin (NEURONTIN) 300 MG capsule TAKE ONE CAPSULE BY MOUTH 3 TIMES DAILY 90 capsule 5  . glipiZIDE (GLUCOTROL XL) 10 MG 24 hr tablet TAKE ONE TABLET BY MOUTH TWICE DAILY 180 tablet 1  . levothyroxine (SYNTHROID) 137 MCG tablet TAKE ONE TABLET BY MOUTH EVERY MORNING 30 tablet 1  . testosterone cypionate (DEPOTESTOSTERONE CYPIONATE) 200 MG/ML injection INJECT 1 MILLILITER IN THE MUSCLE EVERY 2 WEEKS 10 mL 1  . XARELTO 20 MG TABS tablet TAKE ONE TABLET BY MOUTH EVERY EVENING 90 tablet 1   Current Facility-Administered Medications  Medication Dose Route Frequency Provider Last Rate Last Admin  . triamcinolone acetonide (KENALOG-40) injection 20 mg  20 mg Intra-articular Once [name], MD        Allergies  Allergen Reactions  . Propranolol     Drops HR too low   . Clonidine Derivatives Nausea And Vomiting  . Hydralazine Hcl     Patient had an adverse reaction   . Invokana [Canagliflozin]     Patient had an adverse reaction  . Metformin And Related Diarrhea  . Topamax [Topiramate]     Patient had an adverse reaction    Social History   Socioeconomic History  . Marital status: Married    Spouse name: [name]  . Number of children: 3  . Years of education: Not on file  . Highest education level: Not on file  Occupational History  . Occupation: RETIRED    Comment: school principal  Tobacco Use  . Smoking status: Never Smoker  . Smokeless tobacco: Never Used  Vaping Use  . Vaping Use: Never used  Substance and Sexual Activity  . Alcohol use: Not Currently  . Drug use: Never  . Sexual activity: Not on file  Other Topics Concern  . Not on file  Social History Narrative   Lives with wife   Social Determinants of Health   Financial Resource Strain: Not on file  Food  Insecurity: Not on file  Transportation Needs: Not on file  Physical Activity: Not on file  Stress: Not on file  Social Connections: Not on file  Intimate Partner Violence: Not on file     ROS- All systems are reviewed and negative except as per the HPI above.  Physical Exam: Vitals:   06/17/20 1414  BP: 134/68  Pulse: 61  Weight: 114 kg  Height: 6\' 3"  (1.905 m)    GEN- The patient is well appearing obese elderly male, alert and oriented x 3 today.   Head- normocephalic, atraumatic Eyes-  Sclera clear, conjunctiva pink Ears- hearing intact Oropharynx- clear Neck- supple  Lungs- Clear to ausculation bilaterally, normal work of breathing  Heart- Regular rate and rhythm, no murmurs, rubs or gallops  GI- soft, NT, ND, + BS Extremities- no clubbing, cyanosis, or edema MS- no significant deformity or atrophy Skin- no rash or lesion Psych- euthymic mood, full affect Neuro- strength and sensation are intact  Wt Readings from Last 3 Encounters:  06/17/20 114 kg  05/21/20 106.6 kg  04/12/20 105.7 kg    EKG today demonstrates  V paced rhythm with underlying afib QRS duration 172 ms QT/QTc 530/533 ms  Echo 03/01/18 demonstrated  - Left ventricle: The cavity size was normal. Wall thickness was  increased in a pattern of mild LVH. Systolic function was mildly  reduced. The estimated ejection fraction was in the range of 45%  to 50%. Diffuse hypokinesis.  - Left atrium: The atrium was mildly dilated. Volume/bsa, S: 37.6  ml/m^2.  - Right ventricle: The cavity size was moderately dilated. Wall  thickness was normal. Pacer wire or catheter noted in right  ventricle.   Epic records are reviewed at length today  CHA2DS2-VASc Score = 4  The patient's score is based upon: CHF History: No HTN History: Yes Diabetes History: Yes Stroke History: No Vascular Disease History: No Age Score: 2 Gender Score: 0      ASSESSMENT AND PLAN: 1. Persistent atrial  fibrillation  The patient's CHA2DS2-VASc score is 4, indicating a 4.8% annual risk of stroke.   S/p afib ablation 05/21/20 Unfortunately he has back in persistent afib. Will plan to resume amiodarone 200 mg daily If he remains persistent, will consider DCCV. Continue Coreg 12.5 mg BID Continue Xarelto 20 mg daily with no missed doses for at least 3 months post ablation.  2. Secondary Hypercoagulable State (ICD10:  D68.69) The patient is at significant risk for stroke/thromboembolism based upon his CHA2DS2-VASc Score of 4.  Continue Rivaroxaban (Xarelto).   3. Obesity Body mass index is 31.42 kg/m. Lifestyle modification was discussed at length including regular exercise and weight reduction.  4. Obstructive sleep apnea The importance of adequate treatment of sleep apnea was discussed today in order to improve our ability to maintain sinus rhythm long term. Encouraged compliance with CPAP therapy.  5. HTN Stable, no changes today.  6. Tachybradycardia syndrome S/p PPM, followed by Dr Elberta Fortis and the device clinic.   Follow up in the AF clinic in 2 weeks.    Jorja Loa PA-C [facility] [address] [place], [place] [address] [phone] 06/17/2020 2:22 PM

## 2020-06-17 NOTE — Patient Instructions (Signed)
Start Amiodarone '200mg'$  once a day

## 2020-06-23 NOTE — Progress Notes (Signed)
Remote pacemaker transmission.   

## 2020-06-25 ENCOUNTER — Ambulatory Visit (HOSPITAL_COMMUNITY): Payer: Medicare Other | Admitting: Physician Assistant

## 2020-06-28 DIAGNOSIS — L821 Other seborrheic keratosis: Secondary | ICD-10-CM | POA: Diagnosis not present

## 2020-06-28 DIAGNOSIS — L578 Other skin changes due to chronic exposure to nonionizing radiation: Secondary | ICD-10-CM | POA: Diagnosis not present

## 2020-06-28 DIAGNOSIS — L570 Actinic keratosis: Secondary | ICD-10-CM | POA: Diagnosis not present

## 2020-06-28 DIAGNOSIS — C44622 Squamous cell carcinoma of skin of right upper limb, including shoulder: Secondary | ICD-10-CM | POA: Diagnosis not present

## 2020-06-29 ENCOUNTER — Ambulatory Visit (INDEPENDENT_AMBULATORY_CARE_PROVIDER_SITE_OTHER): Payer: Medicare Other | Admitting: Family Medicine

## 2020-06-29 ENCOUNTER — Other Ambulatory Visit: Payer: Self-pay

## 2020-06-29 ENCOUNTER — Encounter: Payer: Self-pay | Admitting: Family Medicine

## 2020-06-29 VITALS — BP 124/68 | HR 65 | Temp 97.6°F | Ht 75.0 in | Wt 247.0 lb

## 2020-06-29 DIAGNOSIS — B351 Tinea unguium: Secondary | ICD-10-CM

## 2020-06-29 DIAGNOSIS — E1142 Type 2 diabetes mellitus with diabetic polyneuropathy: Secondary | ICD-10-CM | POA: Diagnosis not present

## 2020-06-29 DIAGNOSIS — E039 Hypothyroidism, unspecified: Secondary | ICD-10-CM

## 2020-06-29 DIAGNOSIS — G250 Essential tremor: Secondary | ICD-10-CM

## 2020-06-29 DIAGNOSIS — I739 Peripheral vascular disease, unspecified: Secondary | ICD-10-CM

## 2020-06-29 DIAGNOSIS — F5101 Primary insomnia: Secondary | ICD-10-CM | POA: Diagnosis not present

## 2020-06-29 DIAGNOSIS — L97411 Non-pressure chronic ulcer of right heel and midfoot limited to breakdown of skin: Secondary | ICD-10-CM | POA: Diagnosis not present

## 2020-06-29 DIAGNOSIS — R0989 Other specified symptoms and signs involving the circulatory and respiratory systems: Secondary | ICD-10-CM

## 2020-06-29 DIAGNOSIS — L600 Ingrowing nail: Secondary | ICD-10-CM | POA: Diagnosis not present

## 2020-06-29 DIAGNOSIS — E08621 Diabetes mellitus due to underlying condition with foot ulcer: Secondary | ICD-10-CM | POA: Diagnosis not present

## 2020-06-29 DIAGNOSIS — E1159 Type 2 diabetes mellitus with other circulatory complications: Secondary | ICD-10-CM | POA: Diagnosis not present

## 2020-06-29 DIAGNOSIS — Z0000 Encounter for general adult medical examination without abnormal findings: Secondary | ICD-10-CM | POA: Diagnosis not present

## 2020-06-29 MED ORDER — CEPHALEXIN 500 MG PO CAPS: 500.0000 mg | ORAL_CAPSULE | Freq: Two times a day (BID) | ORAL | 0 refills | Status: DC

## 2020-06-29 MED ORDER — ZOLPIDEM TARTRATE 10 MG PO TABS: 10.0000 mg | ORAL_TABLET | Freq: Every evening | ORAL | 1 refills | Status: DC | PRN

## 2020-06-29 NOTE — Progress Notes (Signed)
Subjective:  Patient ID: Paul Kent, male    DOB: [birth year]  Age: 80 y.o. MRN: [id number]  Chief Complaint  Patient presents with  . Pressure Ulcer    HPI  Patient presented today for his annual wellness visit.  He has not been in the office for approximately 4 months.  He asked that I check his feet.  He is complaining of an ulcer on his right heel.  He says this started when he had developed right foot drop and podiatry got him an AFO.  He said it was rubbing badly and so he is no longer using it.  He did not call podiatry to discuss this and see if it could be adjusted.  In addition his wife was checking his feet and told him he had a bad odor to his left foot particularly the great toe.  Patient denies fever.  Patient is diabetic.  His sugars have been running 90s to 100s.  He is not fasting today but he is due for his blood work.  Diabetes is complicated by neuropathy.  He is unable to feel any of his toes.  [facility] Office Visit from 06/29/2020 in [place]  PHQ-2 Total Score 0         Social History   Socioeconomic History  . Marital status: Married    Spouse name: [name]  . Number of children: 3  . Years of education: Not on file  . Highest education level: Not on file  Occupational History  . Occupation: RETIRED    Comment: school principal  Tobacco Use  . Smoking status: Never Smoker  . Smokeless tobacco: Never Used  Vaping Use  . Vaping Use: Never used  Substance and Sexual Activity  . Alcohol use: Not Currently  . Drug use: Never  . Sexual activity: Not on file  Other Topics Concern  . Not on file  Social History Narrative   Lives with wife   Social Determinants of Health   Financial Resource Strain: Not on file  Food Insecurity: Not on file  Transportation Needs: Not on file  Physical Activity: Not on file  Stress: Not on file  Social Connections: Not on file   Past Medical History:  Diagnosis Date  . Acquired bilateral hammer  toes   . Arthralgia of left temporomandibular joint   . Atrial fibrillation ([place]) 05/2016  . Bladder cancer ([place])   . Bradycardia    LOW HEART RATE  . Calculus of ureter   . Cellulitis of right lower limb   . CHF (congestive heart failure) ([place]) 02/2018  . Chronic atrial fibrillation ([place])   . Corns and callosities   . Disturbances of salivary secretion   . Epistaxis   . Essential hypertension   . Essential tremor   . H pylori ulcer   . Hesitancy of micturition   . Hyperlipidemia   . Hypothyroidism   . Impacted cerumen, bilateral   . Jaw pain   . Localized edema   . Male erectile disorder   . Malignant neoplasm of overlapping sites of bladder ([place])   . Other constipation   . Other fatigue   . Overweight   . Pain in right finger(s)   . Pain in right lower leg   . Peptic ulcer disease   . Primary insomnia   . Renal stones   . Renal stones   . 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  Shingles 06/2012  . Shortness of breath   . Sick sinus syndrome ([place])   . Sleep apnea    dx 08/2015, CPAP  . Spontaneous ecchymoses   . Squamous cell carcinoma of skin    UNSPECIFIED  . Testicular hypofunction   . Type 2 diabetes mellitus ([place]) 1994   UNCONTROLLED, COMPLICATIONS INCLUDE NEPHROPATHY AND PERIPHERAL NEUROPATHY   Past Surgical History:  Procedure Laterality Date  . ABLATION  06/2017  . ATRIAL FIBRILLATION ABLATION N/A 05/21/2020   Procedure: ATRIAL FIBRILLATION ABLATION;  Surgeon: Constance Haw, MD;  Location: [place] [facility];  Service: Cardiovascular;  Laterality: N/A;  . CARDIOVERSION N/A 03/22/2018   Procedure: CARDIOVERSION;  Surgeon: Buford Dresser, MD;  Location: [facility] ENDOSCOPY;  Service: Cardiovascular;  Laterality: N/A;  . HEMIARTHROPLASTY HIP  10/31/2013   IT HIP BIPOLAR  . LUMBAR SPINE SURGERY    . PACEMAKER PLACEMENT  01/29/2017  . SHOULDER SURGERY Right     Family History  Problem  Relation Age of Onset  . Heart attack Mother   . Transient ischemic attack Mother   . Heart Problems Father   . Tuberculosis Brother   . Diabetes type II Other   . Hyperlipidemia Other   . Congestive Heart Failure Other   . Arthritis Other    Social History   Socioeconomic History  . Marital status: Married    Spouse name: [name]  . Number of children: 3  . Years of education: Not on file  . Highest education level: Not on file  Occupational History  . Occupation: RETIRED    Comment: school principal  Tobacco Use  . Smoking status: Never Smoker  . Smokeless tobacco: Never Used  Vaping Use  . Vaping Use: Never used  Substance and Sexual Activity  . Alcohol use: Not Currently  . Drug use: Never  . Sexual activity: Not on file  Other Topics Concern  . Not on file  Social History Narrative   Lives with wife   Social Determinants of Health   Financial Resource Strain: Not on file  Food Insecurity: Not on file  Transportation Needs: Not on file  Physical Activity: Not on file  Stress: Not on file  Social Connections: Not on file   Review of Systems  Constitutional: Negative for chills, diaphoresis, fatigue and fever.  HENT: Negative for congestion, ear pain and sore throat.   Respiratory: Negative for cough and shortness of breath.   Cardiovascular: Negative for chest pain and leg swelling.  Gastrointestinal: Negative for abdominal pain, constipation, diarrhea, nausea and vomiting.  Genitourinary: Negative for dysuria and urgency.  Skin: Positive for wound.  Neurological: Negative for dizziness and headaches.  Psychiatric/Behavioral: Negative for dysphoric mood. The patient is not nervous/anxious.      Objective:  BP 124/68   Pulse 65   Temp 97.6 F (36.4 C)   Ht 6\' 3"  (1.905 m)   Wt 247 lb (112 kg)   SpO2 98%   BMI 30.87 kg/m   BP/Weight 06/29/2020 06/17/2020 [id number]  Systolic BP [id number]  Diastolic BP 68 68 62  Wt. (Lbs) 247 251.4 235  BMI 30.87  31.42 29.37    Physical Exam Vitals reviewed.  Constitutional:      Appearance: Normal appearance.  Cardiovascular:     Rate and Rhythm: Normal rate and regular rhythm.     Heart sounds: Normal heart sounds.  Pulmonary:     Effort: Pulmonary effort is normal.     Breath sounds: Normal  breath sounds.  Musculoskeletal:       Feet:  Feet:     Right foot:     Skin integrity: No warmth.     Toenail Condition: Right toenails are abnormally thick and long. Fungal disease present.    Left foot:     Skin integrity: Ulcer present. No warmth.     Toenail Condition: Left toenails are abnormally thick and long. Fungal disease present.    Comments: Patient's feet are cold BL. Minimal pulses. Shiny skin with irregular rubor distally.  Neurological:     Mental Status: He is alert.     Sensory: Sensory deficit present.     Coordination: Coordination abnormal.     Comments: Resting tremor markedly worse on left hand.       Lab Results  Component Value Date   WBC 4.1 05/06/2020   HGB 16.0 05/06/2020   HCT 48.3 05/06/2020   PLT 160 05/06/2020   GLUCOSE 186 (H) 05/06/2020   ALT 21 09/22/2019   AST 21 09/22/2019   NA 137 05/06/2020   K 4.4 05/06/2020   CL 101 05/06/2020   CREATININE 1.30 (H) 05/06/2020   BUN 16 05/06/2020   CO2 23 05/06/2020   TSH 3.180 09/22/2019      Assessment & Plan:  1. Intermittent claudication (HCC) Ordered ABIs bilaterally.  Referral coordinator is to hopefully get these scheduled tomorrow.  2. 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  Needs to soak both feet separately starting tomorrow. - cephALEXin (KEFLEX) 500 MG capsule; Take 1 capsule (500 mg total) by mouth 2 (two) times daily.  Dispense: 14 capsule; Refill: 0  3. Decreased pulses in feet Ordered ABIs bilaterally.  Referral coordinator is to hopefully  get these scheduled tomorrow.  4. Type 2 diabetes mellitus with other circulatory complication, 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HCC) Pt to return for diabetes visit.  - Hemoglobin A1c - Lipid panel - Comprehensive metabolic panel - CBC  5. Ingrowing toenail with infection Toe nail removed. See procedure above.   6. Fungal toenail infection  7. Essential tremor Discuss at next visit. Consider primidone.  8. Diabetic polyneuropathy associated with type 2 diabetes mellitus ([place]) See above.    9. Primary insomnia Rx for ambien.  Body mass index is 30.87 kg/m.   Meds ordered this encounter  Medications  . cephALEXin (KEFLEX) 500 MG capsule    Sig: Take 1 capsule (500 mg total) by mouth 2 (two) times daily.    Dispense:  14 capsule    Refill:  0  . zolpidem (AMBIEN) 10 MG tablet    Sig: Take 1 tablet (10 mg total) by mouth at bedtime as needed for sleep.    Dispense:  30 tablet    Refill:  1   Follow-up: Return in about 1 day (around 06/30/2020) for labs in am. follow up in 2 weeks.  An After Visit Summary was printed and given to the patient.  [name], MD [facility] [phone]

## 2020-06-29 NOTE — Patient Instructions (Addendum)
Needs to come fasting on Wednesday AM.  Keflex antibiotic sent.  Needs to soak rt heel in antibacterial warm water today.  Needs to soak both feet separately starting tomorrow. I am scheduling a vascular study of legs.  Refer to foot doctor.  Fingernail or Toenail Removal, Adult, Care After This sheet gives you information about how to care for yourself after your procedure. Your health care provider may also give you more specific instructions. If you have problems or questions, contact your health care provider. What can I expect after the procedure? After the procedure, it is common to have:  Pain.  Redness.  Swelling.  Soreness. Follow these instructions at home: Medicines  Take over-the-counter and prescription medicines only as told by your health care provider.  If you were prescribed an antibiotic medicine, take or apply it as told by your health care provider. Do not stop using the antibiotic even if you start to feel better. Wound care  Follow instructions from your health care provider about how to take care of your wound. Make sure you: ? Wash your hands with soap and water for at least 20 seconds before and after you change your bandage (dressing). If soap and water are not available, use hand sanitizer. ? Change your dressing as told by your health care provider. ? Keep your dressing dry until your health care provider says it can be removed.  Check your wound every day for signs of infection. Check for: ? More redness, swelling, or pain. ? More fluid or blood. ? Warmth. ? Pus or a bad smell. If you have a splint:  Wear the splint as told by your health care provider. Remove it only as told by your health care provider.  Loosen the splint if your fingers tingle, become numb, or turn cold and blue.  Keep the splint clean.  If the splint is not waterproof: ? Do not let it get wet. ? Cover it with a watertight covering when you take a bath or a shower.    Managing pain, stiffness, and swelling  Move your fingers or toes often to reduce stiffness and swelling.  Raise (elevate) the injured area above the level of your heart while you are sitting or lying down. You may need to keep your hand or foot raised or supported on a pillow for 24 hours or as told by your health care provider. General instructions  If you were given a shoe to wear, wear it as told by your health care provider.  Keep all follow-up visits as told by your health care provider. This is important. Contact a health care provider if:  You have more redness, swelling, or pain around your wound.  You have more fluid or blood coming from your wound.  You have pus or a bad smell coming from your wound.  Your wound feels warm to the touch.  You have a fever. Get help right away if:  Your finger or toe looks pale, blue, or black.  You are not able to move your finger or toe. Summary  After the procedure, it is common to have pain and swelling.  Keep the hand or foot raised (elevated) or supported on a pillow as told by your health care provider.  Take over-the-counter and prescription medicines only as told by your health care provider.  Check your wound every day for signs of infection. This information is not intended to replace advice given to you by your health care provider. Make  sure you discuss any questions you have with your health care provider. Document Revised: 01/27/2019 Document Reviewed: 01/27/2019 Elsevier Patient Education  [place].

## 2020-06-30 ENCOUNTER — Other Ambulatory Visit: Payer: Medicare Other

## 2020-06-30 ENCOUNTER — Other Ambulatory Visit: Payer: Self-pay

## 2020-06-30 DIAGNOSIS — E1159 Type 2 diabetes mellitus with other circulatory complications: Secondary | ICD-10-CM | POA: Diagnosis not present

## 2020-07-01 ENCOUNTER — Ambulatory Visit (INDEPENDENT_AMBULATORY_CARE_PROVIDER_SITE_OTHER): Payer: Medicare Other | Admitting: Podiatry

## 2020-07-01 ENCOUNTER — Encounter: Payer: Self-pay | Admitting: Podiatry

## 2020-07-01 ENCOUNTER — Ambulatory Visit (HOSPITAL_COMMUNITY)
Admission: RE | Admit: 2020-07-01 | Discharge: 2020-07-01 | Disposition: A | Discharge status: Home / Self Care | Payer: Medicare Other | Source: Ambulatory Visit | Attending: Physician Assistant | Admitting: Physician Assistant

## 2020-07-01 ENCOUNTER — Other Ambulatory Visit: Payer: Self-pay

## 2020-07-01 ENCOUNTER — Encounter: Payer: Self-pay | Admitting: Family Medicine

## 2020-07-01 VITALS — BP 156/72 | HR 60 | Ht 75.0 in | Wt 248.8 lb

## 2020-07-01 DIAGNOSIS — E785 Hyperlipidemia, unspecified: Secondary | ICD-10-CM | POA: Insufficient documentation

## 2020-07-01 DIAGNOSIS — E669 Obesity, unspecified: Secondary | ICD-10-CM | POA: Insufficient documentation

## 2020-07-01 DIAGNOSIS — E119 Type 2 diabetes mellitus without complications: Secondary | ICD-10-CM | POA: Diagnosis not present

## 2020-07-01 DIAGNOSIS — Z7901 Long term (current) use of anticoagulants: Secondary | ICD-10-CM | POA: Diagnosis not present

## 2020-07-01 DIAGNOSIS — Z888 Allergy status to other drugs, medicaments and biological substances status: Secondary | ICD-10-CM | POA: Insufficient documentation

## 2020-07-01 DIAGNOSIS — I495 Sick sinus syndrome: Secondary | ICD-10-CM | POA: Diagnosis not present

## 2020-07-01 DIAGNOSIS — E1159 Type 2 diabetes mellitus with other circulatory complications: Secondary | ICD-10-CM

## 2020-07-01 DIAGNOSIS — Z79890 Hormone replacement therapy (postmenopausal): Secondary | ICD-10-CM | POA: Diagnosis not present

## 2020-07-01 DIAGNOSIS — Z6831 Body mass index (BMI) 31.0-31.9, adult: Secondary | ICD-10-CM | POA: Insufficient documentation

## 2020-07-01 DIAGNOSIS — M21371 Foot drop, right foot: Secondary | ICD-10-CM

## 2020-07-01 DIAGNOSIS — I4819 Other persistent atrial fibrillation: Secondary | ICD-10-CM | POA: Diagnosis not present

## 2020-07-01 DIAGNOSIS — Z950 Presence of cardiac pacemaker: Secondary | ICD-10-CM | POA: Insufficient documentation

## 2020-07-01 DIAGNOSIS — I10 Essential (primary) hypertension: Secondary | ICD-10-CM | POA: Insufficient documentation

## 2020-07-01 DIAGNOSIS — G4733 Obstructive sleep apnea (adult) (pediatric): Secondary | ICD-10-CM | POA: Insufficient documentation

## 2020-07-01 DIAGNOSIS — L97411 Non-pressure chronic ulcer of right heel and midfoot limited to breakdown of skin: Secondary | ICD-10-CM

## 2020-07-01 DIAGNOSIS — L97521 Non-pressure chronic ulcer of other part of left foot limited to breakdown of skin: Secondary | ICD-10-CM

## 2020-07-01 DIAGNOSIS — Z79899 Other long term (current) drug therapy: Secondary | ICD-10-CM | POA: Diagnosis not present

## 2020-07-01 DIAGNOSIS — E039 Hypothyroidism, unspecified: Secondary | ICD-10-CM | POA: Diagnosis not present

## 2020-07-01 DIAGNOSIS — Z8249 Family history of ischemic heart disease and other diseases of the circulatory system: Secondary | ICD-10-CM | POA: Diagnosis not present

## 2020-07-01 DIAGNOSIS — D6869 Other thrombophilia: Secondary | ICD-10-CM | POA: Diagnosis not present

## 2020-07-01 LAB — COMPREHENSIVE METABOLIC PANEL
ALT: 12 IU/L (ref 0–44)
AST: 16 IU/L (ref 0–40)
Albumin/Globulin Ratio: 1.6 (ref 1.2–2.2)
Albumin: 4.1 g/dL (ref 3.6–4.6)
Alkaline Phosphatase: 92 IU/L (ref 44–121)
BUN/Creatinine Ratio: 15 (ref 10–24)
BUN: 24 mg/dL (ref 8–27)
Bilirubin Total: 1.2 mg/dL (ref 0.0–1.2)
CO2: 25 mmol/L (ref 20–29)
Calcium: 8.8 mg/dL (ref 8.6–10.2)
Chloride: 102 mmol/L (ref 96–106)
Creatinine, Ser: 1.55 mg/dL — ABNORMAL HIGH (ref 0.76–1.27)
GFR calc Af Amer: 48 mL/min/{1.73_m2} — ABNORMAL LOW (ref >59)
GFR calc non Af Amer: 41 mL/min/{1.73_m2} — ABNORMAL LOW (ref >59)
Globulin, Total: 2.5 g/dL (ref 1.5–4.5)
Glucose: 129 mg/dL — ABNORMAL HIGH (ref 65–99)
Potassium: 4.5 mmol/L (ref 3.5–5.2)
Sodium: 141 mmol/L (ref 134–144)
Total Protein: 6.6 g/dL (ref 6.0–8.5)

## 2020-07-01 LAB — CBC
Hematocrit: 48.1 % (ref 37.5–51.0)
Hemoglobin: 16 g/dL (ref 13.0–17.7)
MCH: 32.7 pg (ref 26.6–33.0)
MCHC: 33.3 g/dL (ref 31.5–35.7)
MCV: 98 fL — ABNORMAL HIGH (ref 79–97)
Platelets: 151 10*3/uL (ref 150–450)
RBC: 4.9 x10E6/uL (ref 4.14–5.80)
RDW: 12.3 % (ref 11.6–15.4)
WBC: 5.6 10*3/uL (ref 3.4–10.8)

## 2020-07-01 LAB — LIPID PANEL
Chol/HDL Ratio: 2.6 ratio (ref 0.0–5.0)
Cholesterol, Total: 105 mg/dL (ref 100–199)
HDL: 41 mg/dL (ref >39)
LDL Chol Calc (NIH): 51 mg/dL (ref 0–99)
Triglycerides: 55 mg/dL (ref 0–149)
VLDL Cholesterol Cal: 13 mg/dL (ref 5–40)

## 2020-07-01 LAB — HEMOGLOBIN A1C
Est. average glucose Bld gHb Est-mCnc: 171 mg/dL
Hgb A1c MFr Bld: 7.6 % — ABNORMAL HIGH (ref 4.8–5.6)

## 2020-07-01 LAB — CARDIOVASCULAR RISK ASSESSMENT

## 2020-07-01 MED ORDER — SILVER SULFADIAZINE 1 % EX CREA: TOPICAL_CREAM | CUTANEOUS | 0 refills | Status: DC

## 2020-07-01 NOTE — Patient Instructions (Signed)
Cardioversion scheduled for Thursday, January 20th  - Arrive at the [facility] and go to admitting at [facility] not eat or drink anything after midnight the night prior to your procedure.  - Take all your morning medication (except diabetic medications) with a sip of water prior to arrival.  - You will not be able to drive home after your procedure.  - Do NOT miss any doses of your blood thinner - if you should miss a dose please notify our office immediately.  - If you feel as if you go back into normal rhythm prior to scheduled cardioversion, please notify our office immediately. If your procedure is canceled in the cardioversion suite you will be charged a cancellation fee.

## 2020-07-01 NOTE — Progress Notes (Signed)
Primary Care Physician: [name], MD Primary Electrophysiologist: Dr Curt Bears Referring Physician: Dr Lenetta Quaker Paul Kent is a 80 y.o. male with a history of HTN, HLD, OSA, DM, hypothyroid, SSS s/p PPM, and atrial fibrillation who presents for follow up in the [facility]. Patient is s/p cryo ablation by Dr Phillis Haggis in 2019. He was changed from flecainide to amiodarone. Patient is on Xarelto for a CHADS2VASC score of 4. He underwent RF afib ablation with Dr Curt Bears on 05/21/20. Patient stopped his amiodarone on his own at that time. The device clinic received an alert on 06/03/20 that he was persistently in afib. Amiodarone was resumed 06/17/20.  On follow up today, patient reports he is doing about the same since his last visit. He resumed amiodarone and remains in afib. Overall he feels well but is frustrated that he is in afib. He denies any missed doses of anticoagulation.   Today, he denies symptoms of palpitations, chest pain, orthopnea, PND, lower extremity edema, dizziness, presyncope, syncope, bleeding, or neurologic sequela. The patient is tolerating medications without difficulties and is otherwise without complaint today.    Atrial Fibrillation Risk Factors:  he does have symptoms or diagnosis of sleep apnea. he is compliant with CPAP therapy. he does not have a history of rheumatic fever.   he has a BMI of Body mass index is 31.1 kg/m.Marland Kitchen Filed Weights   07/01/20 1442  Weight: 112.9 kg    Family History  Problem Relation Age of Onset  . Heart attack Mother   . Transient ischemic attack Mother   . Heart Problems Father   . Tuberculosis Brother   . Diabetes type II Other   . Hyperlipidemia Other   . Congestive Heart Failure Other   . Arthritis Other      Atrial Fibrillation Management history:  Previous antiarrhythmic drugs: flecainide, amiodarone Previous cardioversions: 03/22/18 Previous ablations: cryo 2019,  05/21/20 CHADS2VASC score: 4 Anticoagulation history: Xarelto   Past Medical History:  Diagnosis Date  . Acquired bilateral hammer toes   . Arthralgia of left temporomandibular joint   . Atrial fibrillation ([place]) 05/2016  . Bladder cancer ([place])   . Bradycardia    LOW HEART RATE  . Calculus of ureter   . Cellulitis of right lower limb   . CHF (congestive heart failure) ([place]) 02/2018  . Chronic atrial fibrillation ([place])   . Corns and callosities   . Disturbances of salivary secretion   . Epistaxis   . Essential hypertension   . Essential tremor   . H pylori ulcer   . Hesitancy of micturition   . Hyperlipidemia   . Hypothyroidism   . Impacted cerumen, bilateral   . Jaw pain   . Localized edema   . Male erectile disorder   . Malignant neoplasm of overlapping sites of bladder ([place])   . Other constipation   . Other fatigue   . Overweight   . Pain in right finger(s)   . Pain in right lower leg   . Peptic ulcer disease   . Primary insomnia   . Renal stones   . Renal stones   . 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 Shingles 06/2012  . Shortness of breath   . Sick sinus syndrome ([place])   . Sleep apnea    dx 08/2015, CPAP  . Spontaneous ecchymoses   . Squamous cell carcinoma of skin  UNSPECIFIED  . Testicular hypofunction   . Type 2 diabetes mellitus ([place]) 1994   UNCONTROLLED, COMPLICATIONS INCLUDE NEPHROPATHY AND PERIPHERAL NEUROPATHY   Past Surgical History:  Procedure Laterality Date  . ABLATION  06/2017  . ATRIAL FIBRILLATION ABLATION N/A 05/21/2020   Procedure: ATRIAL FIBRILLATION ABLATION;  Surgeon: Constance Haw, MD;  Location: [place] [facility];  Service: Cardiovascular;  Laterality: N/A;  . CARDIOVERSION N/A 03/22/2018   Procedure: CARDIOVERSION;  Surgeon: Buford Dresser, MD;  Location: [facility];  Service: Cardiovascular;  Laterality: N/A;  . HEMIARTHROPLASTY HIP  10/31/2013   IT  HIP BIPOLAR  . LUMBAR SPINE SURGERY    . PACEMAKER PLACEMENT  01/29/2017  . SHOULDER SURGERY Right     Current Outpatient Medications  Medication Sig Dispense Refill  . amiodarone (PACERONE) 200 MG tablet Take 1 tablet (200 mg total) by mouth daily. 90 tablet 0  . atorvastatin (LIPITOR) 10 MG tablet TAKE ONE TABLET BY MOUTH EVERY EVENING 90 tablet 2  . carvedilol (COREG) 12.5 MG tablet Take 0.5 tablets (6.26 mg total) by mouth 2 (two) times daily with a meal. 90 tablet 3  . cephALEXin (KEFLEX) 500 MG capsule Take 1 capsule (500 mg total) by mouth 2 (two) times daily. 14 capsule 0  . gabapentin (NEURONTIN) 300 MG capsule TAKE ONE CAPSULE BY MOUTH 3 TIMES DAILY 90 capsule 5  . glipiZIDE (GLUCOTROL XL) 10 MG 24 hr tablet TAKE ONE TABLET BY MOUTH TWICE DAILY 180 tablet 1  . levothyroxine (SYNTHROID) 137 MCG tablet TAKE ONE TABLET BY MOUTH EVERY MORNING 30 tablet 1  . silver sulfADIAZINE (SILVADENE) 1 % cream Apply pea-sized amount to wound daily. 50 g 0  . testosterone cypionate (DEPOTESTOSTERONE CYPIONATE) 200 MG/ML injection INJECT 1 MILLILITER IN THE MUSCLE EVERY 2 WEEKS 10 mL 1  . XARELTO 20 MG TABS tablet TAKE ONE TABLET BY MOUTH EVERY EVENING 90 tablet 1  . zolpidem (AMBIEN) 10 MG tablet Take 1 tablet (10 mg total) by mouth at bedtime as needed for sleep. 30 tablet 1   Current Facility-Administered Medications  Medication Dose Route Frequency Provider Last Rate Last Admin  . triamcinolone acetonide (KENALOG-40) injection 20 mg  20 mg Intra-articular Once [name], MD        Allergies  Allergen Reactions  . Propranolol     Drops HR too low   . Clonidine Derivatives Nausea And Vomiting  . Hydralazine Hcl     Patient had an adverse reaction   . Invokana [Canagliflozin]     Patient had an adverse reaction  . Metformin And Related Diarrhea  . Topamax [Topiramate]     Patient had an adverse reaction    Social History   Socioeconomic History  . Marital status: Married     Spouse name: [name]  . Number of children: 3  . Years of education: Not on file  . Highest education level: Not on file  Occupational History  . Occupation: RETIRED    Comment: school principal  Tobacco Use  . Smoking status: Never Smoker  . Smokeless tobacco: Never Used  Vaping Use  . Vaping Use: Never used  Substance and Sexual Activity  . Alcohol use: Not Currently  . Drug use: Never  . Sexual activity: Not on file  Other Topics Concern  . Not on file  Social History Narrative   Lives with wife   Social Determinants of Health   Financial Resource Strain: Not on file  Food Insecurity: Not on file  Transportation Needs: Not on file  Physical Activity: Not on file  Stress: Not on file  Social Connections: Not on file  Intimate Partner Violence: Not on file     ROS- All systems are reviewed and negative except as per the HPI above.  Physical Exam: Vitals:   07/01/20 1442  BP: (!) 156/72  Pulse: 60  Weight: 112.9 kg  Height: 6\' 3"  (1.905 m)    GEN- The patient is well appearing obese elderly male, alert and oriented x 3 today.   HEENT-head normocephalic, atraumatic, sclera clear, conjunctiva pink, hearing intact, trachea midline. Lungs- Clear to ausculation bilaterally, normal work of breathing Heart- Regular rate and rhythm, no murmurs, rubs or gallops  GI- soft, NT, ND, + BS Extremities- no clubbing, cyanosis, or edema MS- no significant deformity or atrophy Skin- no rash or lesion Psych- euthymic mood, full affect Neuro- strength and sensation are intact   Wt Readings from Last 3 Encounters:  07/01/20 112.9 kg  06/29/20 112 kg  06/17/20 114 kg    EKG today demonstrates  V paced rhythm with underlying afib Vent. rate 60 BPM QRS duration 172 ms QT/QTc 546/546 ms  Echo 03/01/18 demonstrated  - Left ventricle: The cavity size was normal. Wall thickness was  increased in a pattern of mild LVH. Systolic function was mildly  reduced. The estimated  ejection fraction was in the range of 45%  to 50%. Diffuse hypokinesis.  - Left atrium: The atrium was mildly dilated. Volume/bsa, S: 37.6  ml/m^2.  - Right ventricle: The cavity size was moderately dilated. Wall  thickness was normal. Pacer wire or catheter noted in right  ventricle.   Epic records are reviewed at length today  CHA2DS2-VASc Score = 4  The patient's score is based upon: CHF History: No HTN History: Yes Diabetes History: Yes Stroke History: No Vascular Disease History: No Age Score: 2 Gender Score: 0      ASSESSMENT AND PLAN: 1. Persistent atrial fibrillation  The patient's CHA2DS2-VASc score is 4, indicating a 4.8% annual risk of stroke.   S/p afib ablation 05/21/20 Patient remains in persistent afib despite resuming amiodarone.  Will arrange for DCCV. Recent labwork reviewed.  Continue amiodarone 200 mg daily Continue Coreg 12.5 mg BID Continue Xarelto 20 mg daily with no missed doses for at least 3 months post ablation.  2. Secondary Hypercoagulable State (ICD10:  D68.69) The patient is at significant risk for stroke/thromboembolism based upon his CHA2DS2-VASc Score of 4.  Continue Rivaroxaban (Xarelto).   3. Obesity Body mass index is 31.1 kg/m. Lifestyle modification was discussed and encouraged including regular physical activity and weight reduction.  4. Obstructive sleep apnea Encouraged compliance with CPAP therapy.  5. HTN Stable, no changes today.  6. Tachybradycardia syndrome S/p PPM, followed by Dr Curt Bears and the device clinic.   Follow up in the AF clinic post DCCV.    [facility] [address] [place], [place] [address] [phone] 07/01/2020 4:44 PM

## 2020-07-01 NOTE — H&P (View-Only) (Signed)
Primary Care Physician: [name], MD Primary Electrophysiologist: Dr Curt Bears Referring Physician: Dr Lenetta Quaker Paul Kent is a 80 y.o. male with a history of HTN, HLD, OSA, DM, hypothyroid, SSS s/p PPM, and atrial fibrillation who presents for follow up in the [facility]. Patient is s/p cryo ablation by Dr Phillis Haggis in 2019. He was changed from flecainide to amiodarone. Patient is on Xarelto for a CHADS2VASC score of 4. He underwent RF afib ablation with Dr Curt Bears on 05/21/20. Patient stopped his amiodarone on his own at that time. The device clinic received an alert on 06/03/20 that he was persistently in afib. Amiodarone was resumed 06/17/20.  On follow up today, patient reports he is doing about the same since his last visit. He resumed amiodarone and remains in afib. Overall he feels well but is frustrated that he is in afib. He denies any missed doses of anticoagulation.   Today, he denies symptoms of palpitations, chest pain, orthopnea, PND, lower extremity edema, dizziness, presyncope, syncope, bleeding, or neurologic sequela. The patient is tolerating medications without difficulties and is otherwise without complaint today.    Atrial Fibrillation Risk Factors:  he does have symptoms or diagnosis of sleep apnea. he is compliant with CPAP therapy. he does not have a history of rheumatic fever.   he has a BMI of Body mass index is 31.1 kg/m.Marland Kitchen Filed Weights   07/01/20 1442  Weight: 112.9 kg    Family History  Problem Relation Age of Onset  . Heart attack Mother   . Transient ischemic attack Mother   . Heart Problems Father   . Tuberculosis Brother   . Diabetes type II Other   . Hyperlipidemia Other   . Congestive Heart Failure Other   . Arthritis Other      Atrial Fibrillation Management history:  Previous antiarrhythmic drugs: flecainide, amiodarone Previous cardioversions: 03/22/18 Previous ablations: cryo 2019,  05/21/20 CHADS2VASC score: 4 Anticoagulation history: Xarelto   Past Medical History:  Diagnosis Date  . Acquired bilateral hammer toes   . Arthralgia of left temporomandibular joint   . Atrial fibrillation ([place]) 05/2016  . Bladder cancer ([place])   . Bradycardia    LOW HEART RATE  . Calculus of ureter   . Cellulitis of right lower limb   . CHF (congestive heart failure) ([place]) 02/2018  . Chronic atrial fibrillation ([place])   . Corns and callosities   . Disturbances of salivary secretion   . Epistaxis   . Essential hypertension   . Essential tremor   . H pylori ulcer   . Hesitancy of micturition   . Hyperlipidemia   . Hypothyroidism   . Impacted cerumen, bilateral   . Jaw pain   . Localized edema   . Male erectile disorder   . Malignant neoplasm of overlapping sites of bladder ([place])   . Other constipation   . Other fatigue   . Overweight   . Pain in right finger(s)   . Pain in right lower leg   . Peptic ulcer disease   . Primary insomnia   . Renal stones   . Renal stones   . 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 Shingles 06/2012  . Shortness of breath   . Sick sinus syndrome ([place])   . Sleep apnea    dx 08/2015, CPAP  . Spontaneous ecchymoses   . Squamous cell carcinoma of skin  UNSPECIFIED  . Testicular hypofunction   . Type 2 diabetes mellitus ([place]) 1994   UNCONTROLLED, COMPLICATIONS INCLUDE NEPHROPATHY AND PERIPHERAL NEUROPATHY   Past Surgical History:  Procedure Laterality Date  . ABLATION  06/2017  . ATRIAL FIBRILLATION ABLATION N/A 05/21/2020   Procedure: ATRIAL FIBRILLATION ABLATION;  Surgeon: Constance Haw, MD;  Location: [place];  Service: Cardiovascular;  Laterality: N/A;  . CARDIOVERSION N/A 03/22/2018   Procedure: CARDIOVERSION;  Surgeon: Buford Dresser, MD;  Location: [facility];  Service: Cardiovascular;  Laterality: N/A;  . HEMIARTHROPLASTY HIP  10/31/2013   IT  HIP BIPOLAR  . LUMBAR SPINE SURGERY    . PACEMAKER PLACEMENT  01/29/2017  . SHOULDER SURGERY Right     Current Outpatient Medications  Medication Sig Dispense Refill  . amiodarone (PACERONE) 200 MG tablet Take 1 tablet (200 mg total) by mouth daily. 90 tablet 0  . atorvastatin (LIPITOR) 10 MG tablet TAKE ONE TABLET BY MOUTH EVERY EVENING 90 tablet 2  . carvedilol (COREG) 12.5 MG tablet Take 0.5 tablets (6.26 mg total) by mouth 2 (two) times daily with a meal. 90 tablet 3  . cephALEXin (KEFLEX) 500 MG capsule Take 1 capsule (500 mg total) by mouth 2 (two) times daily. 14 capsule 0  . gabapentin (NEURONTIN) 300 MG capsule TAKE ONE CAPSULE BY MOUTH 3 TIMES DAILY 90 capsule 5  . glipiZIDE (GLUCOTROL XL) 10 MG 24 hr tablet TAKE ONE TABLET BY MOUTH TWICE DAILY 180 tablet 1  . levothyroxine (SYNTHROID) 137 MCG tablet TAKE ONE TABLET BY MOUTH EVERY MORNING 30 tablet 1  . silver sulfADIAZINE (SILVADENE) 1 % cream Apply pea-sized amount to wound daily. 50 g 0  . testosterone cypionate (DEPOTESTOSTERONE CYPIONATE) 200 MG/ML injection INJECT 1 MILLILITER IN THE MUSCLE EVERY 2 WEEKS 10 mL 1  . XARELTO 20 MG TABS tablet TAKE ONE TABLET BY MOUTH EVERY EVENING 90 tablet 1  . zolpidem (AMBIEN) 10 MG tablet Take 1 tablet (10 mg total) by mouth at bedtime as needed for sleep. 30 tablet 1   Current Facility-Administered Medications  Medication Dose Route Frequency Provider Last Rate Last Admin  . triamcinolone acetonide (KENALOG-40) injection 20 mg  20 mg Intra-articular Once [name], MD        Allergies  Allergen Reactions  . Propranolol     Drops HR too low   . Clonidine Derivatives Nausea And Vomiting  . Hydralazine Hcl     Patient had an adverse reaction   . Invokana [Canagliflozin]     Patient had an adverse reaction  . Metformin And Related Diarrhea  . Topamax [Topiramate]     Patient had an adverse reaction    Social History   Socioeconomic History  . Marital status: Married     Spouse name: [name]  . Number of children: 3  . Years of education: Not on file  . Highest education level: Not on file  Occupational History  . Occupation: RETIRED    Comment: school principal  Tobacco Use  . Smoking status: Never Smoker  . Smokeless tobacco: Never Used  Vaping Use  . Vaping Use: Never used  Substance and Sexual Activity  . Alcohol use: Not Currently  . Drug use: Never  . Sexual activity: Not on file  Other Topics Concern  . Not on file  Social History Narrative   Lives with wife   Social Determinants of Health   Financial Resource Strain: Not on file  Food Insecurity: Not on file  Transportation Needs: Not on file  Physical Activity: Not on file  Stress: Not on file  Social Connections: Not on file  Intimate Partner Violence: Not on file     ROS- All systems are reviewed and negative except as per the HPI above.  Physical Exam: Vitals:   07/01/20 1442  BP: (!) 156/72  Pulse: 60  Weight: 112.9 kg  Height: 6\' 3"  (1.905 m)    GEN- The patient is well appearing obese elderly male, alert and oriented x 3 today.   HEENT-head normocephalic, atraumatic, sclera clear, conjunctiva pink, hearing intact, trachea midline. Lungs- Clear to ausculation bilaterally, normal work of breathing Heart- Regular rate and rhythm, no murmurs, rubs or gallops  GI- soft, NT, ND, + BS Extremities- no clubbing, cyanosis, or edema MS- no significant deformity or atrophy Skin- no rash or lesion Psych- euthymic mood, full affect Neuro- strength and sensation are intact   Wt Readings from Last 3 Encounters:  07/01/20 112.9 kg  06/29/20 112 kg  06/17/20 114 kg    EKG today demonstrates  V paced rhythm with underlying afib Vent. rate 60 BPM QRS duration 172 ms QT/QTc 546/546 ms  Echo 03/01/18 demonstrated  - Left ventricle: The cavity size was normal. Wall thickness was  increased in a pattern of mild LVH. Systolic function was mildly  reduced. The estimated  ejection fraction was in the range of 45%  to 50%. Diffuse hypokinesis.  - Left atrium: The atrium was mildly dilated. Volume/bsa, S: 37.6  ml/m^2.  - Right ventricle: The cavity size was moderately dilated. Wall  thickness was normal. Pacer wire or catheter noted in right  ventricle.   Epic records are reviewed at length today  CHA2DS2-VASc Score = 4  The patient's score is based upon: CHF History: No HTN History: Yes Diabetes History: Yes Stroke History: No Vascular Disease History: No Age Score: 2 Gender Score: 0      ASSESSMENT AND PLAN: 1. Persistent atrial fibrillation  The patient's CHA2DS2-VASc score is 4, indicating a 4.8% annual risk of stroke.   S/p afib ablation 05/21/20 Patient remains in persistent afib despite resuming amiodarone.  Will arrange for DCCV. Recent labwork reviewed.  Continue amiodarone 200 mg daily Continue Coreg 12.5 mg BID Continue Xarelto 20 mg daily with no missed doses for at least 3 months post ablation.  2. Secondary Hypercoagulable State (ICD10:  D68.69) The patient is at significant risk for stroke/thromboembolism based upon his CHA2DS2-VASc Score of 4.  Continue Rivaroxaban (Xarelto).   3. Obesity Body mass index is 31.1 kg/m. Lifestyle modification was discussed and encouraged including regular physical activity and weight reduction.  4. Obstructive sleep apnea Encouraged compliance with CPAP therapy.  5. HTN Stable, no changes today.  6. Tachybradycardia syndrome S/p PPM, followed by Dr Curt Bears and the device clinic.   Follow up in the AF clinic post DCCV.    [facility] [address] [place], [place] [address] [phone] 07/01/2020 4:44 PM

## 2020-07-01 NOTE — Progress Notes (Signed)
  Subjective:  Patient ID: Paul Kent, male    DOB: 03/18/1941,  MRN: [id number]  Chief Complaint  Patient presents with  . Nail Problem    Dr Tobie Poet removed the left big toenail due to it being loose   . Foot Ulcer    The right heel is about the same and the brace I got did this    80 y.o. male presents for wound care. Hx confirmed with patient.  Objective:  Physical Exam: Wound Location: right heel Wound Measurement: 2x1 Wound Base: Granular/Healthy Peri-wound: Reddened, Macerated Exudate: Scant/small amount Serosanguinous exudate + induration and no undermining; no fluctuance or crepitus.  Nail avulsion site left hallux nail healing well, fully granular, no warmth, erythema, signs of infection. Assessment:   1. Ulcer of heel, right, limited to breakdown of skin ([place])   2. Ulcer of great toe, left, limited to breakdown of skin ([place])   3. Type 2 diabetes mellitus with other circulatory complication, 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[place])   4. Right foot drop      Plan:  Patient was evaluated and treated and all questions answered.  Wound left great toe s/p avulsion, right heel ulcer  -Ok to soak both areas daily -Continue keflex to completion -Dress both with silvadene and bandage daily. Applied to both today. -Pending vascular studies unsure if these are venous or arterial  Dropfoot -His weakness to the right ankle and concern for dropfoot is markedly improved. I advised he d/c the brace that he has as I think it is causing more harm than good and he does not have a true dropfoot deformity.  No follow-ups on file.

## 2020-07-02 ENCOUNTER — Other Ambulatory Visit (HOSPITAL_COMMUNITY): Payer: Self-pay | Admitting: *Deleted

## 2020-07-02 ENCOUNTER — Encounter: Payer: Self-pay | Admitting: Family Medicine

## 2020-07-02 DIAGNOSIS — E08621 Diabetes mellitus due to underlying condition with foot ulcer: Secondary | ICD-10-CM | POA: Diagnosis not present

## 2020-07-02 DIAGNOSIS — R0989 Other specified symptoms and signs involving the circulatory and respiratory systems: Secondary | ICD-10-CM | POA: Diagnosis not present

## 2020-07-02 DIAGNOSIS — I739 Peripheral vascular disease, unspecified: Secondary | ICD-10-CM | POA: Diagnosis not present

## 2020-07-06 ENCOUNTER — Other Ambulatory Visit (HOSPITAL_COMMUNITY)
Admission: RE | Admit: 2020-07-06 | Discharge: 2020-07-06 | Disposition: A | Discharge status: Home / Self Care | Payer: Medicare Other | Source: Ambulatory Visit | Attending: Cardiology | Admitting: Cardiology

## 2020-07-06 DIAGNOSIS — Z20822 Contact with and (suspected) exposure to covid-19: Secondary | ICD-10-CM | POA: Diagnosis not present

## 2020-07-06 DIAGNOSIS — Z01812 Encounter for preprocedural laboratory examination: Secondary | ICD-10-CM | POA: Insufficient documentation

## 2020-07-07 LAB — SARS CORONAVIRUS 2 (TAT 6-24 HRS): SARS Coronavirus 2: NEGATIVE

## 2020-07-07 NOTE — Progress Notes (Signed)
Pre call done for cardioversion tomorrow. Patient states he has been quarantined since covid test, has not missed any doses of his blood thinner and will take tonight, will be NPO in morning,and confirmed has a ride home post procedure. All questions addressed.

## 2020-07-08 ENCOUNTER — Encounter (HOSPITAL_COMMUNITY)
Admission: RE | Disposition: A | Discharge status: Home / Self Care | Payer: Self-pay | Source: Home / Self Care | Attending: Cardiology

## 2020-07-08 ENCOUNTER — Ambulatory Visit (HOSPITAL_COMMUNITY)
Admission: RE | Admit: 2020-07-08 | Discharge: 2020-07-08 | Disposition: A | Discharge status: Home / Self Care | Payer: Medicare Other | Attending: Cardiology | Admitting: Cardiology

## 2020-07-08 ENCOUNTER — Other Ambulatory Visit: Payer: Self-pay

## 2020-07-08 ENCOUNTER — Ambulatory Visit (HOSPITAL_COMMUNITY): Payer: Medicare Other | Admitting: Anesthesiology

## 2020-07-08 DIAGNOSIS — Z6831 Body mass index (BMI) 31.0-31.9, adult: Secondary | ICD-10-CM | POA: Diagnosis not present

## 2020-07-08 DIAGNOSIS — R0602 Shortness of breath: Secondary | ICD-10-CM | POA: Diagnosis not present

## 2020-07-08 DIAGNOSIS — J810 Acute pulmonary edema: Secondary | ICD-10-CM | POA: Diagnosis not present

## 2020-07-08 DIAGNOSIS — Z7901 Long term (current) use of anticoagulants: Secondary | ICD-10-CM | POA: Insufficient documentation

## 2020-07-08 DIAGNOSIS — Z9989 Dependence on other enabling machines and devices: Secondary | ICD-10-CM | POA: Diagnosis not present

## 2020-07-08 DIAGNOSIS — Z950 Presence of cardiac pacemaker: Secondary | ICD-10-CM | POA: Insufficient documentation

## 2020-07-08 DIAGNOSIS — Z79890 Hormone replacement therapy (postmenopausal): Secondary | ICD-10-CM | POA: Diagnosis not present

## 2020-07-08 DIAGNOSIS — D6869 Other thrombophilia: Secondary | ICD-10-CM | POA: Diagnosis not present

## 2020-07-08 DIAGNOSIS — I4819 Other persistent atrial fibrillation: Secondary | ICD-10-CM | POA: Diagnosis not present

## 2020-07-08 DIAGNOSIS — E669 Obesity, unspecified: Secondary | ICD-10-CM | POA: Diagnosis not present

## 2020-07-08 DIAGNOSIS — I495 Sick sinus syndrome: Secondary | ICD-10-CM | POA: Diagnosis not present

## 2020-07-08 DIAGNOSIS — I110 Hypertensive heart disease with heart failure: Secondary | ICD-10-CM | POA: Diagnosis not present

## 2020-07-08 DIAGNOSIS — G4733 Obstructive sleep apnea (adult) (pediatric): Secondary | ICD-10-CM | POA: Insufficient documentation

## 2020-07-08 DIAGNOSIS — Z79899 Other long term (current) drug therapy: Secondary | ICD-10-CM | POA: Diagnosis not present

## 2020-07-08 DIAGNOSIS — Z7984 Long term (current) use of oral hypoglycemic drugs: Secondary | ICD-10-CM | POA: Diagnosis not present

## 2020-07-08 DIAGNOSIS — J90 Pleural effusion, not elsewhere classified: Secondary | ICD-10-CM | POA: Diagnosis not present

## 2020-07-08 DIAGNOSIS — I10 Essential (primary) hypertension: Secondary | ICD-10-CM | POA: Diagnosis not present

## 2020-07-08 DIAGNOSIS — I517 Cardiomegaly: Secondary | ICD-10-CM | POA: Diagnosis not present

## 2020-07-08 DIAGNOSIS — I509 Heart failure, unspecified: Secondary | ICD-10-CM | POA: Diagnosis not present

## 2020-07-08 DIAGNOSIS — I480 Paroxysmal atrial fibrillation: Secondary | ICD-10-CM | POA: Diagnosis not present

## 2020-07-08 DIAGNOSIS — R0902 Hypoxemia: Secondary | ICD-10-CM | POA: Diagnosis not present

## 2020-07-08 HISTORY — PX: CARDIOVERSION: SHX1299

## 2020-07-08 LAB — GLUCOSE, CAPILLARY: Glucose-Capillary: 100 mg/dL — ABNORMAL HIGH (ref 70–99)

## 2020-07-08 SURGERY — CARDIOVERSION
Anesthesia: General

## 2020-07-08 MED ORDER — SODIUM CHLORIDE 0.9 % IV SOLN: INTRAVENOUS | Status: DC

## 2020-07-08 MED ORDER — PROPOFOL 10 MG/ML IV BOLUS
INTRAVENOUS | Status: DC | PRN
  Administered 2020-07-08: 60 mg via INTRAVENOUS

## 2020-07-08 MED ORDER — LIDOCAINE HCL (CARDIAC) PF 100 MG/5ML IV SOSY
PREFILLED_SYRINGE | INTRAVENOUS | Status: DC | PRN
  Administered 2020-07-08: 60 mg via INTRATRACHEAL

## 2020-07-08 NOTE — CV Procedure (Addendum)
    Electrical Cardioversion Procedure Note Paul Kent [id number] [birth year]  Procedure: Electrical Cardioversion Indications:  Atrial Fibrillation  Time Out: Verified patient identification, verified procedure,medications/allergies/relevent history reviewed, required imaging and test results available.  Performed  Procedure Details  The patient was NPO after midnight. Anesthesia was administered at the beside  by Dr. Doroteo Glassman with propofol.  Cardioversion was performed with synchronized biphasic defibrillation via AP pads with 200 joules.  1 attempt(s) were performed.  The patient converted to normal sinus rhythm. The patient tolerated the procedure well   IMPRESSION:  Successful cardioversion of atrial fibrillation. Biotronik pacer interrogated and normal    Paul Kent 07/08/2020, 9:03 AM

## 2020-07-08 NOTE — Anesthesia Postprocedure Evaluation (Signed)
Anesthesia Post Note  Patient: Paul Kent  Procedure(s) Performed: CARDIOVERSION (N/A )     Patient location during evaluation: PACU Anesthesia Type: General Level of consciousness: awake and alert, oriented and patient cooperative Pain management: pain level controlled Vital Signs Assessment: post-procedure vital signs reviewed and stable Respiratory status: spontaneous breathing, nonlabored ventilation and respiratory function stable Cardiovascular status: blood pressure returned to baseline and stable Postop Assessment: no apparent nausea or vomiting Anesthetic complications: no   No complications documented.  Last Vitals:  Vitals:   07/08/20 0920 07/08/20 0927  BP: (!) 157/82 (!) 162/85  Pulse: 63 66  Resp: 15 15  Temp:    SpO2: 99% 99%    Last Pain:  Vitals:   07/08/20 0927  TempSrc:   PainSc: 0-No pain                 Pervis Hocking

## 2020-07-08 NOTE — Anesthesia Procedure Notes (Signed)
Procedure Name: General with mask airway Date/Time: 07/08/2020 9:00 AM Performed by: Amadeo Garnet, CRNA Pre-anesthesia Checklist: Patient identified, Suction available, Emergency Drugs available and Patient being monitored Patient Re-evaluated:Patient Re-evaluated prior to induction Oxygen Delivery Method: Ambu bag Preoxygenation: Pre-oxygenation with 100% oxygen Induction Type: IV induction Placement Confirmation: positive ETCO2 Dental Injury: Teeth and Oropharynx as per pre-operative assessment

## 2020-07-08 NOTE — Anesthesia Preprocedure Evaluation (Addendum)
Anesthesia Evaluation  Patient identified by MRN, date of birth, ID band Patient awake    Reviewed: Allergy & Precautions, NPO status , Patient's Chart, lab work & pertinent test results  Airway Mallampati: I  TM Distance: >3 FB Neck ROM: Full    Dental  (+) Teeth Intact, Dental Advisory Given   Pulmonary shortness of breath and with exertion, sleep apnea and Continuous Positive Airway Pressure Ventilation ,    Pulmonary exam normal breath sounds clear to auscultation       Cardiovascular hypertension, Pt. on home beta blockers and Pt. on medications +CHF (LVEF 45-50%)  + dysrhythmias (xarelto) Atrial Fibrillation + pacemaker (brady s/p PPM 2018)  Rhythm:Irregular Rate:Normal  Echo 2019: - Left ventricle: The cavity size was normal. Wall thickness was  increased in a pattern of mild LVH. Systolic function was mildly  reduced. The estimated ejection fraction was in the range of 45%  to 50%. Diffuse hypokinesis.  - Left atrium: The atrium was mildly dilated. Volume/bsa, S: 37.6  ml/m^2.  - Right ventricle: The cavity size was moderately dilated. Wall  thickness was normal. Pacer wire or catheter noted in right  ventricle.    Neuro/Psych Essential tremor negative psych ROS   GI/Hepatic Neg liver ROS, PUD,   Endo/Other  diabetes, Well Controlled, Type 2Hypothyroidism A1c 7.6  Renal/GU Renal InsufficiencyRenal diseaseCr 1.55  negative genitourinary   Musculoskeletal negative musculoskeletal ROS (+)   Abdominal   Peds  Hematology negative hematology ROS (+)   Anesthesia Other Findings   Reproductive/Obstetrics negative OB ROS                            Anesthesia Physical Anesthesia Plan  ASA: III  Anesthesia Plan: General   Post-op Pain Management:    Induction: Intravenous  PONV Risk Score and Plan: TIVA and Treatment may vary due to age or medical condition  Airway  Management Planned: Natural Airway and Mask  Additional Equipment: None  Intra-op Plan:   Post-operative Plan:   Informed Consent: I have reviewed the patients History and Physical, chart, labs and discussed the procedure including the risks, benefits and alternatives for the proposed anesthesia with the patient or authorized representative who has indicated his/her understanding and acceptance.       Plan Discussed with: CRNA  Anesthesia Plan Comments:        Anesthesia Quick Evaluation

## 2020-07-08 NOTE — Discharge Instructions (Signed)
Electrical Cardioversion Electrical cardioversion is the delivery of a jolt of electricity to restore a normal rhythm to the heart. A rhythm that is too fast or is not regular keeps the heart from pumping well. In this procedure, sticky patches or metal paddles are placed on the chest to deliver electricity to the heart from a device. This procedure may be done in an emergency if:  There is low or no blood pressure as a result of the heart rhythm.  Normal rhythm must be restored as fast as possible to protect the brain and heart from further damage.  It may save a life. This may also be a scheduled procedure for irregular or fast heart rhythms that are not immediately life-threatening. Tell a health care provider about:  Any allergies you have.  All medicines you are taking, including vitamins, herbs, eye drops, creams, and over-the-counter medicines.  Any problems you or family members have had with anesthetic medicines.  Any blood disorders you have.  Any surgeries you have had.  Any medical conditions you have.  Whether you are pregnant or may be pregnant. What are the risks? Generally, this is a safe procedure. However, problems may occur, including:  Allergic reactions to medicines.  A blood clot that breaks free and travels to other parts of your body.  The possible return of an abnormal heart rhythm within hours or days after the procedure.  Your heart stopping (cardiac arrest). This is rare. What happens before the procedure? Medicines  Your health care provider may have you start taking: ? Blood-thinning medicines (anticoagulants) so your blood does not clot as easily. ? Medicines to help stabilize your heart rate and rhythm.  Ask your health care provider about: ? Changing or stopping your regular medicines. This is especially important if you are taking diabetes medicines or blood thinners. ? Taking medicines such as aspirin and ibuprofen. These medicines can  thin your blood. Do not take these medicines unless your health care provider tells you to take them. ? Taking over-the-counter medicines, vitamins, herbs, and supplements. General instructions  Follow instructions from your health care provider about eating or drinking restrictions.  Plan to have someone take you home from the hospital or clinic.  If you will be going home right after the procedure, plan to have someone with you for 24 hours.  Ask your health care provider what steps will be taken to help prevent infection. These may include washing your skin with a germ-killing soap. What happens during the procedure?  An IV will be inserted into one of your veins.  Sticky patches (electrodes) or metal paddles may be placed on your chest.  You will be given a medicine to help you relax (sedative).  An electrical shock will be delivered. The procedure may vary among health care providers and hospitals.   What can I expect after the procedure?  Your blood pressure, heart rate, breathing rate, and blood oxygen level will be monitored until you leave the hospital or clinic.  Your heart rhythm will be watched to make sure it does not change.  You may have some redness on the skin where the shocks were given. Follow these instructions at home:  Do not drive for 24 hours if you were given a sedative during your procedure.  Take over-the-counter and prescription medicines only as told by your health care provider.  Ask your health care provider how to check your pulse. Check it often.  Rest for 48 hours after the procedure   or as told by your health care provider.  Avoid or limit your caffeine use as told by your health care provider.  Keep all follow-up visits as told by your health care provider. This is important. Contact a health care provider if:  You feel like your heart is beating too quickly or your pulse is not regular.  You have a serious muscle cramp that does not go  away. Get help right away if:  You have discomfort in your chest.  You are dizzy or you feel faint.  You have trouble breathing or you are short of breath.  Your speech is slurred.  You have trouble moving an arm or leg on one side of your body.  Your fingers or toes turn cold or blue. Summary  Electrical cardioversion is the delivery of a jolt of electricity to restore a normal rhythm to the heart.  This procedure may be done right away in an emergency or may be a scheduled procedure if the condition is not an emergency.  Generally, this is a safe procedure.  After the procedure, check your pulse often as told by your health care provider. This information is not intended to replace advice given to you by your health care provider. Make sure you discuss any questions you have with your health care provider. Document Revised: 01/06/2019 Document Reviewed: 01/06/2019 Elsevier Patient Education  2021 [employer]  

## 2020-07-08 NOTE — Transfer of Care (Signed)
Immediate Anesthesia Transfer of Care Note  Patient: Paul Kent  Procedure(s) Performed: CARDIOVERSION (N/A )  Patient Location: Endoscopy Unit  Anesthesia Type:General  Level of Consciousness: awake, alert  and oriented  Airway & Oxygen Therapy: Patient Spontanous Breathing and Patient connected to nasal cannula oxygen  Post-op Assessment: Report given to RN, Post -op Vital signs reviewed and stable and Patient moving all extremities  Post vital signs: Reviewed and stable  Last Vitals:  Vitals Value Taken Time  BP    Temp    Pulse    Resp    SpO2      Last Pain:  Vitals:   07/08/20 0808  TempSrc: Oral  PainSc: 0-No pain         Complications: No complications documented.

## 2020-07-08 NOTE — Interval H&P Note (Signed)
History and Physical Interval Note:  07/08/2020 8:56 AM  Paul Kent  has presented today for surgery, with the diagnosis of AFIB.  The various methods of treatment have been discussed with the patient and family. After consideration of risks, benefits and other options for treatment, the patient has consented to  Procedure(s): CARDIOVERSION (N/A) as a surgical intervention.  The patient's history has been reviewed, patient examined, no change in status, stable for surgery.  I have reviewed the patient's chart and labs.  Questions were answered to the patient's satisfaction.     [employer]

## 2020-07-09 ENCOUNTER — Encounter: Payer: Self-pay | Admitting: Cardiology

## 2020-07-09 DIAGNOSIS — N170 Acute kidney failure with tubular necrosis: Secondary | ICD-10-CM | POA: Diagnosis not present

## 2020-07-09 DIAGNOSIS — I361 Nonrheumatic tricuspid (valve) insufficiency: Secondary | ICD-10-CM | POA: Diagnosis not present

## 2020-07-09 DIAGNOSIS — Z8719 Personal history of other diseases of the digestive system: Secondary | ICD-10-CM | POA: Diagnosis not present

## 2020-07-09 DIAGNOSIS — I4891 Unspecified atrial fibrillation: Secondary | ICD-10-CM | POA: Diagnosis not present

## 2020-07-09 DIAGNOSIS — E7800 Pure hypercholesterolemia, unspecified: Secondary | ICD-10-CM | POA: Diagnosis not present

## 2020-07-09 DIAGNOSIS — I517 Cardiomegaly: Secondary | ICD-10-CM | POA: Diagnosis not present

## 2020-07-09 DIAGNOSIS — I509 Heart failure, unspecified: Secondary | ICD-10-CM | POA: Diagnosis not present

## 2020-07-09 DIAGNOSIS — E039 Hypothyroidism, unspecified: Secondary | ICD-10-CM | POA: Diagnosis not present

## 2020-07-09 DIAGNOSIS — I480 Paroxysmal atrial fibrillation: Secondary | ICD-10-CM | POA: Diagnosis not present

## 2020-07-09 DIAGNOSIS — E119 Type 2 diabetes mellitus without complications: Secondary | ICD-10-CM | POA: Diagnosis not present

## 2020-07-09 DIAGNOSIS — J90 Pleural effusion, not elsewhere classified: Secondary | ICD-10-CM | POA: Diagnosis not present

## 2020-07-09 DIAGNOSIS — J810 Acute pulmonary edema: Secondary | ICD-10-CM | POA: Diagnosis not present

## 2020-07-09 DIAGNOSIS — Z9889 Other specified postprocedural states: Secondary | ICD-10-CM | POA: Diagnosis not present

## 2020-07-09 DIAGNOSIS — J180 Bronchopneumonia, unspecified organism: Secondary | ICD-10-CM | POA: Diagnosis not present

## 2020-07-09 DIAGNOSIS — I110 Hypertensive heart disease with heart failure: Secondary | ICD-10-CM | POA: Diagnosis not present

## 2020-07-09 DIAGNOSIS — R0902 Hypoxemia: Secondary | ICD-10-CM | POA: Diagnosis not present

## 2020-07-09 DIAGNOSIS — Z7902 Long term (current) use of antithrombotics/antiplatelets: Secondary | ICD-10-CM | POA: Diagnosis not present

## 2020-07-09 DIAGNOSIS — Z79899 Other long term (current) drug therapy: Secondary | ICD-10-CM | POA: Diagnosis not present

## 2020-07-09 DIAGNOSIS — I10 Essential (primary) hypertension: Secondary | ICD-10-CM | POA: Diagnosis not present

## 2020-07-09 DIAGNOSIS — I5031 Acute diastolic (congestive) heart failure: Secondary | ICD-10-CM | POA: Diagnosis not present

## 2020-07-09 DIAGNOSIS — Z950 Presence of cardiac pacemaker: Secondary | ICD-10-CM | POA: Diagnosis not present

## 2020-07-09 DIAGNOSIS — R0602 Shortness of breath: Secondary | ICD-10-CM | POA: Diagnosis not present

## 2020-07-10 DIAGNOSIS — I509 Heart failure, unspecified: Secondary | ICD-10-CM | POA: Diagnosis not present

## 2020-07-10 DIAGNOSIS — I4891 Unspecified atrial fibrillation: Secondary | ICD-10-CM | POA: Diagnosis not present

## 2020-07-10 DIAGNOSIS — J180 Bronchopneumonia, unspecified organism: Secondary | ICD-10-CM | POA: Diagnosis not present

## 2020-07-10 DIAGNOSIS — E7800 Pure hypercholesterolemia, unspecified: Secondary | ICD-10-CM | POA: Diagnosis not present

## 2020-07-11 ENCOUNTER — Encounter (HOSPITAL_COMMUNITY): Payer: Self-pay | Admitting: Cardiology

## 2020-07-12 ENCOUNTER — Ambulatory Visit: Payer: Medicare Other | Admitting: Podiatry

## 2020-07-13 ENCOUNTER — Other Ambulatory Visit: Payer: Self-pay

## 2020-07-13 ENCOUNTER — Ambulatory Visit (INDEPENDENT_AMBULATORY_CARE_PROVIDER_SITE_OTHER): Payer: Medicare Other | Admitting: Family Medicine

## 2020-07-13 VITALS — BP 124/64 | HR 72 | Temp 97.8°F | Resp 18 | Ht 75.0 in | Wt 238.0 lb

## 2020-07-13 DIAGNOSIS — L97411 Non-pressure chronic ulcer of right heel and midfoot limited to breakdown of skin: Secondary | ICD-10-CM

## 2020-07-13 DIAGNOSIS — I509 Heart failure, unspecified: Secondary | ICD-10-CM

## 2020-07-13 DIAGNOSIS — I480 Paroxysmal atrial fibrillation: Secondary | ICD-10-CM | POA: Diagnosis not present

## 2020-07-13 DIAGNOSIS — I5032 Chronic diastolic (congestive) heart failure: Secondary | ICD-10-CM | POA: Diagnosis not present

## 2020-07-13 DIAGNOSIS — E08621 Diabetes mellitus due to underlying condition with foot ulcer: Secondary | ICD-10-CM | POA: Diagnosis not present

## 2020-07-13 NOTE — Progress Notes (Signed)
Subjective:  Patient ID: Paul Kent, male    DOB: 1940-08-11  Age: 80 y.o. MRN: [id number]  Chief Complaint  Patient presents with  . pulmonary edema    HPI Pt presents for follow up from hospitalization from 1/20-1/22/2022.  The patient is struggled with recurrent atrial fibrillation despite an ablation and subsequent medications including flecainide and then eventually amiodarone.  One day prior to this admission he underwent an elective cardioversion.  He was discharged home however he began to have shortness of breath particularly lying down.  He denies any chest pain.  The patient was found to be in decompensated heart failure.  Patient also has a pacemaker.  His proBNP was 2810.  His chest x-ray showed cardiomegaly megaly with mild interstitial edema.  CTA of the chest showed no pulmonary emboli but small bilateral pleural effusions.  He had minimal elevation of troponin which is related to the cardioversion that he had yesterday.  He was in sinus rhythm throughout his admission.  Echocardiogram during his admission showed normal EF.  The patient was diuresed with Lasix and his fluid weight dropped by 10 pounds.  Patient's last visit he was found to have an infected left great toenail which I removed and his right heel had a ulceration.  He was given antibiotics and he was referred to podiatry who is now monitoring his wounds.  ABI at that time was normal.  Current Outpatient Medications on File Prior to Visit  Medication Sig Dispense Refill  . furosemide (LASIX) 40 MG tablet Take 40 mg by mouth.    . potassium chloride (KLOR-CON) 20 MEQ packet Take 20 mEq by mouth daily.    Marland Kitchen amiodarone (PACERONE) 200 MG tablet Take 1 tablet (200 mg total) by mouth daily. 90 tablet 0  . atorvastatin (LIPITOR) 10 MG tablet TAKE ONE TABLET BY MOUTH EVERY EVENING 90 tablet 2  . carvedilol (COREG) 12.5 MG tablet Take 0.5 tablets (6.26 mg total) by mouth 2 (two) times daily with a meal. 90 tablet 3  .  gabapentin (NEURONTIN) 300 MG capsule TAKE ONE CAPSULE BY MOUTH 3 TIMES DAILY 90 capsule 5  . glipiZIDE (GLUCOTROL XL) 10 MG 24 hr tablet TAKE ONE TABLET BY MOUTH TWICE DAILY 180 tablet 1  . levothyroxine (SYNTHROID) 137 MCG tablet TAKE ONE TABLET BY MOUTH EVERY MORNING 30 tablet 1  . silver sulfADIAZINE (SILVADENE) 1 % cream Apply pea-sized amount to wound daily. 50 g 0  . testosterone cypionate (DEPOTESTOSTERONE CYPIONATE) 200 MG/ML injection INJECT 1 MILLILITER IN THE MUSCLE EVERY 2 WEEKS 10 mL 1  . XARELTO 20 MG TABS tablet TAKE ONE TABLET BY MOUTH EVERY EVENING 90 tablet 1  . zolpidem (AMBIEN) 10 MG tablet Take 1 tablet (10 mg total) by mouth at bedtime as needed for sleep. 30 tablet 1   Current Facility-Administered Medications on File Prior to Visit  Medication Dose Route Frequency Provider Last Rate Last Admin  . triamcinolone acetonide (KENALOG-40) injection 20 mg  20 mg Intra-articular Once [name], MD       Past Medical History:  Diagnosis Date  . Acquired bilateral hammer toes   . Arthralgia of left temporomandibular joint   . Atrial fibrillation ([place]) 05/2016  . Bladder cancer ([place])   . Bradycardia    LOW HEART RATE  . Calculus of ureter   . Cellulitis of right lower limb   . CHF (congestive heart failure) ([place]) 02/2018  . Chronic atrial fibrillation ([place])   . Corns and  callosities   . Disturbances of salivary secretion   . Epistaxis   . Essential hypertension   . Essential tremor   . H pylori ulcer   . Hesitancy of micturition   . Hyperlipidemia   . Hypothyroidism   . Impacted cerumen, bilateral   . Jaw pain   . Localized edema   . Male erectile disorder   . Malignant neoplasm of overlapping sites of bladder ([place])   . Other constipation   . Other fatigue   . Overweight   . Pain in right finger(s)   . Pain in right lower leg   . Paroxysmal atrial fibrillation ([place]) 06/08/2016  . Peptic ulcer disease   . Persistent atrial fibrillation ([place]) 06/01/2017  .  Primary insomnia   . Renal stones   . Renal stones   . 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 Shingles 06/2012  . Shortness of breath   . Sick sinus syndrome ([place])   . Sinus bradycardia 03/07/2017  . Sleep apnea    dx 08/2015, CPAP  . Spontaneous ecchymoses   . Squamous cell carcinoma of skin    UNSPECIFIED  . Testicular hypofunction   . Type 2 diabetes mellitus ([place]) 1994   UNCONTROLLED, COMPLICATIONS INCLUDE NEPHROPATHY AND PERIPHERAL NEUROPATHY  . Type 2 diabetes mellitus with circulatory disorder, 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[place]) 01/17/2017   Past Surgical History:  Procedure Laterality Date  . ABLATION  06/2017  . ATRIAL FIBRILLATION ABLATION N/A 05/21/2020   Procedure: ATRIAL FIBRILLATION ABLATION;  Surgeon: Constance Haw, MD;  Location: [facility];  Service: Cardiovascular;  Laterality: N/A;  . CARDIOVERSION N/A 03/22/2018   Procedure: CARDIOVERSION;  Surgeon: Buford Dresser, MD;  Location: [facility] ENDOSCOPY;  Service: Cardiovascular;  Laterality: N/A;  . CARDIOVERSION N/A 07/08/2020   Procedure: CARDIOVERSION;  Surgeon: Jerline Pain, MD;  Location: [facility] ENDOSCOPY;  Service: Cardiovascular;  Laterality: N/A;  . HEMIARTHROPLASTY HIP  10/31/2013   IT HIP BIPOLAR  . LUMBAR SPINE SURGERY    . PACEMAKER PLACEMENT  01/29/2017  . SHOULDER SURGERY Right     Family History  Problem Relation Age of Onset  . Heart attack Mother   . Transient ischemic attack Mother   . Heart Problems Father   . Tuberculosis Brother   . Diabetes type II Other   . Hyperlipidemia Other   . Congestive Heart Failure Other   . Arthritis Other    Social History   Socioeconomic History  . Marital status: Married    Spouse name: [name]  . Number of children: 3  . Years of education: Not on file  . Highest education level: Not on file  Occupational History  . Occupation: RETIRED    Comment: school principal   Tobacco Use  . Smoking status: Never Smoker  . Smokeless tobacco: Never Used  Vaping Use  . Vaping Use: Never used  Substance and Sexual Activity  . Alcohol use: Not Currently  . Drug use: Never  . Sexual activity: Not on file  Other Topics Concern  . Not on file  Social History Narrative   Lives with wife   Social Determinants of Health   Financial Resource Strain: Not on file  Food Insecurity: Not on file  Transportation Needs: Not on file  Physical Activity: Not on file  Stress: Not on file  Social Connections: Not on file    Review of Systems  Constitutional: Negative for  chills and fever.  HENT: Negative for congestion, rhinorrhea and sore throat.   Respiratory: Negative for cough and shortness of breath.   Cardiovascular: Negative for chest pain and palpitations.  Gastrointestinal: Positive for constipation. Negative for abdominal pain, diarrhea, nausea and vomiting.  Genitourinary: Negative for dysuria and urgency.  Musculoskeletal: Negative for arthralgias, back pain and myalgias.  Skin: Positive for wound (right heel).  Neurological: Negative for dizziness and headaches.  Psychiatric/Behavioral: Negative for dysphoric mood. The patient is not nervous/anxious.      Objective:  BP 124/64   Pulse 72   Temp 97.8 F (36.6 C)   Resp 18   Ht 6\' 3"  (1.905 m)   Wt 238 lb (108 kg)   BMI 29.75 kg/m   BP/Weight 07/14/2020 07/13/2020 5/39/7673  Systolic BP 419 379 024  Diastolic BP 86 64 85  Wt. (Lbs) 238.6 238 249  BMI 29.82 29.75 31.12    Physical Exam Vitals reviewed.  Constitutional:      Appearance: Normal appearance.  Cardiovascular:     Rate and Rhythm: Normal rate and regular rhythm.     Heart sounds: Normal heart sounds.  Pulmonary:     Effort: Pulmonary effort is normal.     Breath sounds: Normal breath sounds.  Abdominal:     Tenderness: There is no abdominal tenderness.  Musculoskeletal:     Right lower leg: No edema.     Left lower leg:  No edema.  Skin:    Findings: Lesion (Ulcer on left posterior and inferior heel.  Approximately this time is of a $0.50 piece) present.  Neurological:     Mental Status: He is alert and oriented to person, place, and time.  Psychiatric:        Mood and Affect: Mood normal.        Behavior: Behavior normal.     Diabetic Foot Exam - Simple   No data filed      Lab Results  Component Value Date   WBC 5.6 06/30/2020   HGB 16.0 06/30/2020   HCT 48.1 06/30/2020   PLT 151 06/30/2020   GLUCOSE 155 (H) 07/13/2020   CHOL 105 06/30/2020   TRIG 55 06/30/2020   HDL 41 06/30/2020   LDLCALC 51 06/30/2020   ALT 12 07/13/2020   AST 18 07/13/2020   NA 141 07/13/2020   K 4.9 07/13/2020   CL 103 07/13/2020   CREATININE 1.74 (H) 07/13/2020   BUN 29 (H) 07/13/2020   CO2 21 07/13/2020   TSH 3.180 09/22/2019   HGBA1C 7.6 (H) 06/30/2020      Assessment & Plan:   1. 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   2. Chronic diastolic congestive heart failure (HCC) Continue current medicines.  Compensated. - potassium chloride (KLOR-CON) 20 MEQ packet; Take 20 mEq by mouth daily. - furosemide (LASIX) 40 MG tablet; Take 40 mg by mouth. - Comprehensive metabolic panel  3. Paroxysmal atrial fibrillation (HCC) Defer to cardiology for management. They did continue his amiodarone.   Orders Placed This Encounter  Procedures  . Comprehensive metabolic panel    Follow-up: Return in about 3 months (around 10/11/2020) for fasting .  An After Visit Summary was printed and given to the patient.  [name], MD [facility] [phone]

## 2020-07-14 ENCOUNTER — Ambulatory Visit (HOSPITAL_COMMUNITY)
Admission: RE | Admit: 2020-07-14 | Discharge: 2020-07-14 | Disposition: A | Discharge status: Home / Self Care | Payer: Medicare Other | Source: Ambulatory Visit | Attending: Physician Assistant | Admitting: Physician Assistant

## 2020-07-14 ENCOUNTER — Encounter (HOSPITAL_COMMUNITY): Payer: Self-pay | Admitting: Physician Assistant

## 2020-07-14 VITALS — BP 134/86 | HR 85 | Ht 75.0 in | Wt 238.6 lb

## 2020-07-14 DIAGNOSIS — D6869 Other thrombophilia: Secondary | ICD-10-CM | POA: Diagnosis not present

## 2020-07-14 DIAGNOSIS — Z950 Presence of cardiac pacemaker: Secondary | ICD-10-CM | POA: Diagnosis not present

## 2020-07-14 DIAGNOSIS — I4819 Other persistent atrial fibrillation: Secondary | ICD-10-CM | POA: Diagnosis not present

## 2020-07-14 DIAGNOSIS — E785 Hyperlipidemia, unspecified: Secondary | ICD-10-CM | POA: Diagnosis not present

## 2020-07-14 DIAGNOSIS — G4733 Obstructive sleep apnea (adult) (pediatric): Secondary | ICD-10-CM | POA: Diagnosis not present

## 2020-07-14 DIAGNOSIS — I495 Sick sinus syndrome: Secondary | ICD-10-CM | POA: Insufficient documentation

## 2020-07-14 DIAGNOSIS — I10 Essential (primary) hypertension: Secondary | ICD-10-CM | POA: Insufficient documentation

## 2020-07-14 NOTE — Progress Notes (Addendum)
Primary Care Physician: Blane Ohara, MD Primary Electrophysiologist: Dr Elberta Fortis Referring Physician: Dr Lavonne Chick is a 80 y.o. male with a history of HTN, HLD, OSA, DM, hypothyroid, SSS s/p PPM, and atrial fibrillation who presents for follow up in the [facility]. Patient is s/p cryo ablation by Dr Terri Skains in 2019. He was changed from flecainide to amiodarone. Patient is on Xarelto for a CHADS2VASC score of 4. He underwent RF afib ablation with Dr Elberta Fortis on 05/21/20. Patient stopped his amiodarone on his own at that time. The device clinic received an alert on 06/03/20 that he was persistently in afib. Amiodarone was resumed 06/17/20.  On follow up today, patient is s/p DCCV on 07/08/20. He did develop flash pulmonary edema that night and was seen at [facility] for diuresis. He has been doing well since then with no heart racing or palpitations. He feels his is back to his dry weight.   Today, he denies symptoms of palpitations, chest pain, orthopnea, PND, lower extremity edema, dizziness, presyncope, syncope, bleeding, or neurologic sequela. The patient is tolerating medications without difficulties and is otherwise without complaint today.    Atrial Fibrillation Risk Factors:  he does have symptoms or diagnosis of sleep apnea. he is compliant with CPAP therapy. he does not have a history of rheumatic fever.   he has a BMI of Body mass index is 29.82 kg/m.Marland Kitchen Filed Weights   07/14/20 1134  Weight: 108.2 kg    Family History  Problem Relation Age of Onset  . Heart attack Mother   . Transient ischemic attack Mother   . Heart Problems Father   . Tuberculosis Brother   . Diabetes type II Other   . Hyperlipidemia Other   . Congestive Heart Failure Other   . Arthritis Other      Atrial Fibrillation Management history:  Previous antiarrhythmic drugs: flecainide, amiodarone Previous cardioversions: 03/22/18, 07/08/20 Previous  ablations: cryo 2019, 05/21/20 CHADS2VASC score: 4 Anticoagulation history: Xarelto   Past Medical History:  Diagnosis Date  . Acquired bilateral hammer toes   . Arthralgia of left temporomandibular joint   . Atrial fibrillation (HCC) 05/2016  . Bladder cancer (HCC)   . Bradycardia    LOW HEART RATE  . Calculus of ureter   . Cellulitis of right lower limb   . CHF (congestive heart failure) (HCC) 02/2018  . Chronic atrial fibrillation (HCC)   . Corns and callosities   . Disturbances of salivary secretion   . Epistaxis   . Essential hypertension   . Essential tremor   . H pylori ulcer   . Hesitancy of micturition   . Hyperlipidemia   . Hypothyroidism   . Impacted cerumen, bilateral   . Jaw pain   . Localized edema   . Male erectile disorder   . Malignant 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)   . Other constipation   . Other fatigue   . Overweight   . Pain in right finger(s)   . Pain in right lower leg   . Peptic ulcer disease   . Primary insomnia   . Renal stones   . Renal stones   . 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 Shingles 06/2012  . Shortness of breath   . Sick sinus syndrome (HCC)   . Sleep apnea    dx 08/2015, CPAP  . Spontaneous ecchymoses   . Squamous  cell carcinoma of skin    UNSPECIFIED  . Testicular hypofunction   . Type 2 diabetes mellitus ([place]) 1994   UNCONTROLLED, COMPLICATIONS INCLUDE NEPHROPATHY AND PERIPHERAL NEUROPATHY   Past Surgical History:  Procedure Laterality Date  . ABLATION  06/2017  . ATRIAL FIBRILLATION ABLATION N/A 05/21/2020   Procedure: ATRIAL FIBRILLATION ABLATION;  Surgeon: Constance Haw, MD;  Location: [place] CV LAB;  Service: Cardiovascular;  Laterality: N/A;  . CARDIOVERSION N/A 03/22/2018   Procedure: CARDIOVERSION;  Surgeon: Buford Dresser, MD;  Location: [facility] ENDOSCOPY;  Service: Cardiovascular;  Laterality: N/A;  . CARDIOVERSION  N/A 07/08/2020   Procedure: CARDIOVERSION;  Surgeon: Jerline Pain, MD;  Location: [facility] ENDOSCOPY;  Service: Cardiovascular;  Laterality: N/A;  . HEMIARTHROPLASTY HIP  10/31/2013   IT HIP BIPOLAR  . LUMBAR SPINE SURGERY    . PACEMAKER PLACEMENT  01/29/2017  . SHOULDER SURGERY Right     Current Outpatient Medications  Medication Sig Dispense Refill  . amiodarone (PACERONE) 200 MG tablet Take 1 tablet (200 mg total) by mouth daily. 90 tablet 0  . atorvastatin (LIPITOR) 10 MG tablet TAKE ONE TABLET BY MOUTH EVERY EVENING 90 tablet 2  . carvedilol (COREG) 12.5 MG tablet Take 0.5 tablets (6.26 mg total) by mouth 2 (two) times daily with a meal. 90 tablet 3  . furosemide (LASIX) 40 MG tablet Take 40 mg by mouth.    . gabapentin (NEURONTIN) 300 MG capsule TAKE ONE CAPSULE BY MOUTH 3 TIMES DAILY 90 capsule 5  . glipiZIDE (GLUCOTROL XL) 10 MG 24 hr tablet TAKE ONE TABLET BY MOUTH TWICE DAILY 180 tablet 1  . levothyroxine (SYNTHROID) 137 MCG tablet TAKE ONE TABLET BY MOUTH EVERY MORNING 30 tablet 1  . potassium chloride (KLOR-CON) 20 MEQ packet Take 20 mEq by mouth daily.    . silver sulfADIAZINE (SILVADENE) 1 % cream Apply pea-sized amount to wound daily. 50 g 0  . testosterone cypionate (DEPOTESTOSTERONE CYPIONATE) 200 MG/ML injection INJECT 1 MILLILITER IN THE MUSCLE EVERY 2 WEEKS 10 mL 1  . XARELTO 20 MG TABS tablet TAKE ONE TABLET BY MOUTH EVERY EVENING 90 tablet 1  . zolpidem (AMBIEN) 10 MG tablet Take 1 tablet (10 mg total) by mouth at bedtime as needed for sleep. 30 tablet 1   Current Facility-Administered Medications  Medication Dose Route Frequency Provider Last Rate Last Admin  . triamcinolone acetonide (KENALOG-40) injection 20 mg  20 mg Intra-articular Once [name], MD        Allergies  Allergen Reactions  . Propranolol     Drops HR too low   . Clonidine Derivatives Nausea And Vomiting  . Hydralazine Hcl     Patient had an adverse reaction   . Invokana [Canagliflozin]      Patient had an adverse reaction  . Metformin And Related Diarrhea  . Topamax [Topiramate]     Patient had an adverse reaction    Social History   Socioeconomic History  . Marital status: Married    Spouse name: [name]  . Number of children: 3  . Years of education: Not on file  . Highest education level: Not on file  Occupational History  . Occupation: RETIRED    Comment: school principal  Tobacco Use  . Smoking status: Never Smoker  . Smokeless tobacco: Never Used  Vaping Use  . Vaping Use: Never used  Substance and Sexual Activity  . Alcohol use: Not Currently  . Drug use: Never  . Sexual activity: Not on  file  Other Topics Concern  . Not on file  Social History Narrative   Lives with wife   Social Determinants of Health   Financial Resource Strain: Not on file  Food Insecurity: Not on file  Transportation Needs: Not on file  Physical Activity: Not on file  Stress: Not on file  Social Connections: Not on file  Intimate Partner Violence: Not on file     ROS- All systems are reviewed and negative except as per the HPI above.  Physical Exam: Vitals:   07/14/20 1134  BP: 134/86  Pulse: 85  Weight: 108.2 kg  Height: 6\' 3"  (1.905 m)    GEN- The patient is well appearing elderly male, alert and oriented x 3 today.   HEENT-head normocephalic, atraumatic, sclera clear, conjunctiva pink, hearing intact, trachea midline. Lungs- Clear to ausculation bilaterally, normal work of breathing Heart- Regular rate and rhythm, no murmurs, rubs or gallops  GI- soft, NT, ND, + BS Extremities- no clubbing, cyanosis, or edema MS- no significant deformity or atrophy Skin- no rash or lesion Psych- euthymic mood, full affect Neuro- strength and sensation are intact   Wt Readings from Last 3 Encounters:  07/14/20 108.2 kg  07/13/20 108 kg  07/08/20 112.9 kg    EKG today demonstrates  AV dual paced rhythm  Vent. rate 85 BPM PR interval 248 ms QRS duration 174 ms QT/QTc  470/559 ms  Echo 03/01/18 demonstrated  - Left ventricle: The cavity size was normal. Wall thickness was  increased in a pattern of mild LVH. Systolic function was mildly  reduced. The estimated ejection fraction was in the range of 45%  to 50%. Diffuse hypokinesis.  - Left atrium: The atrium was mildly dilated. Volume/bsa, S: 37.6  ml/m^2.  - Right ventricle: The cavity size was moderately dilated. Wall  thickness was normal. Pacer wire or catheter noted in right  ventricle.   Epic records are reviewed at length today  CHA2DS2-VASc Score = 4  The patient's score is based upon: CHF History: No HTN History: Yes Diabetes History: Yes Stroke History: No Vascular Disease History: No Age Score: 2 Gender Score: 0      ASSESSMENT AND PLAN: 1. Persistent atrial fibrillation  The patient's CHA2DS2-VASc score is 4, indicating a 4.8% annual risk of stroke.   S/p afib ablation 05/21/20 S/p DCCV on 07/08/20 Patient appears to be maintaining SR. Continue amiodarone 200 mg daily Continue Coreg 12.5 mg BID Continue Xarelto 20 mg daily with no missed doses for at least 3 months post ablation. Decrease Lasix and KCl to PRN for swelling and weight gain.  2. Secondary Hypercoagulable State (ICD10:  D68.69) The patient is at significant risk for stroke/thromboembolism based upon his CHA2DS2-VASc Score of 4.  Continue Rivaroxaban (Xarelto).   3. Obstructive sleep apnea Encouraged compliance with CPAP therapy.  4. HTN Stable, no changes today.  5. Tachybradycardia syndrome S/p PPM, followed by Dr Curt Bears and the device clinic.   Follow up with Dr Curt Bears as scheduled.    [facility] [address] [place], [place] [address] [phone] 07/14/2020 11:48 AM

## 2020-07-15 ENCOUNTER — Ambulatory Visit: Payer: Medicare Other | Admitting: Podiatry

## 2020-07-15 ENCOUNTER — Other Ambulatory Visit: Payer: Self-pay

## 2020-07-15 ENCOUNTER — Encounter: Payer: Self-pay | Admitting: Podiatry

## 2020-07-15 ENCOUNTER — Ambulatory Visit (INDEPENDENT_AMBULATORY_CARE_PROVIDER_SITE_OTHER): Payer: Medicare Other | Admitting: Podiatry

## 2020-07-15 DIAGNOSIS — L97411 Non-pressure chronic ulcer of right heel and midfoot limited to breakdown of skin: Secondary | ICD-10-CM | POA: Diagnosis not present

## 2020-07-15 DIAGNOSIS — L928 Other granulomatous disorders of the skin and subcutaneous tissue: Secondary | ICD-10-CM

## 2020-07-15 LAB — COMPREHENSIVE METABOLIC PANEL
ALT: 12 IU/L (ref 0–44)
AST: 18 IU/L (ref 0–40)
Albumin/Globulin Ratio: 1.6 (ref 1.2–2.2)
Albumin: 4.1 g/dL (ref 3.6–4.6)
Alkaline Phosphatase: 103 IU/L (ref 44–121)
BUN/Creatinine Ratio: 17 (ref 10–24)
BUN: 29 mg/dL — ABNORMAL HIGH (ref 8–27)
Bilirubin Total: 1.1 mg/dL (ref 0.0–1.2)
CO2: 21 mmol/L (ref 20–29)
Calcium: 8.7 mg/dL (ref 8.6–10.2)
Chloride: 103 mmol/L (ref 96–106)
Creatinine, Ser: 1.74 mg/dL — ABNORMAL HIGH (ref 0.76–1.27)
GFR calc Af Amer: 42 mL/min/{1.73_m2} — ABNORMAL LOW (ref >59)
GFR calc non Af Amer: 36 mL/min/{1.73_m2} — ABNORMAL LOW (ref >59)
Globulin, Total: 2.6 g/dL (ref 1.5–4.5)
Glucose: 155 mg/dL — ABNORMAL HIGH (ref 65–99)
Potassium: 4.9 mmol/L (ref 3.5–5.2)
Sodium: 141 mmol/L (ref 134–144)
Total Protein: 6.7 g/dL (ref 6.0–8.5)

## 2020-07-16 ENCOUNTER — Telehealth: Payer: Self-pay | Admitting: Cardiology

## 2020-07-16 DIAGNOSIS — E663 Overweight: Secondary | ICD-10-CM | POA: Insufficient documentation

## 2020-07-16 DIAGNOSIS — E785 Hyperlipidemia, unspecified: Secondary | ICD-10-CM | POA: Insufficient documentation

## 2020-07-16 DIAGNOSIS — N401 Enlarged prostate with lower urinary tract symptoms: Secondary | ICD-10-CM | POA: Diagnosis not present

## 2020-07-16 DIAGNOSIS — L84 Corns and callosities: Secondary | ICD-10-CM | POA: Insufficient documentation

## 2020-07-16 DIAGNOSIS — M26622 Arthralgia of left temporomandibular joint: Secondary | ICD-10-CM | POA: Insufficient documentation

## 2020-07-16 DIAGNOSIS — E875 Hyperkalemia: Secondary | ICD-10-CM

## 2020-07-16 DIAGNOSIS — K279 Peptic ulcer, site unspecified, unspecified as acute or chronic, without hemorrhage or perforation: Secondary | ICD-10-CM | POA: Insufficient documentation

## 2020-07-16 DIAGNOSIS — M79644 Pain in right finger(s): Secondary | ICD-10-CM | POA: Insufficient documentation

## 2020-07-16 DIAGNOSIS — I482 Chronic atrial fibrillation, unspecified: Secondary | ICD-10-CM | POA: Insufficient documentation

## 2020-07-16 DIAGNOSIS — N529 Male erectile dysfunction, unspecified: Secondary | ICD-10-CM | POA: Insufficient documentation

## 2020-07-16 DIAGNOSIS — K5909 Other constipation: Secondary | ICD-10-CM | POA: Insufficient documentation

## 2020-07-16 DIAGNOSIS — E1169 Type 2 diabetes mellitus with other specified complication: Secondary | ICD-10-CM | POA: Insufficient documentation

## 2020-07-16 DIAGNOSIS — C678 Malignant neoplasm of overlapping sites of bladder: Secondary | ICD-10-CM | POA: Insufficient documentation

## 2020-07-16 DIAGNOSIS — I5032 Chronic diastolic (congestive) heart failure: Secondary | ICD-10-CM

## 2020-07-16 DIAGNOSIS — R3911 Hesitancy of micturition: Secondary | ICD-10-CM | POA: Insufficient documentation

## 2020-07-16 DIAGNOSIS — C679 Malignant neoplasm of bladder, unspecified: Secondary | ICD-10-CM | POA: Insufficient documentation

## 2020-07-16 DIAGNOSIS — L03115 Cellulitis of right lower limb: Secondary | ICD-10-CM | POA: Insufficient documentation

## 2020-07-16 DIAGNOSIS — H6123 Impacted cerumen, bilateral: Secondary | ICD-10-CM | POA: Insufficient documentation

## 2020-07-16 DIAGNOSIS — M2042 Other hammer toe(s) (acquired), left foot: Secondary | ICD-10-CM | POA: Insufficient documentation

## 2020-07-16 DIAGNOSIS — R0602 Shortness of breath: Secondary | ICD-10-CM | POA: Insufficient documentation

## 2020-07-16 DIAGNOSIS — R600 Localized edema: Secondary | ICD-10-CM | POA: Insufficient documentation

## 2020-07-16 DIAGNOSIS — R5383 Other fatigue: Secondary | ICD-10-CM | POA: Insufficient documentation

## 2020-07-16 DIAGNOSIS — K117 Disturbances of salivary secretion: Secondary | ICD-10-CM | POA: Insufficient documentation

## 2020-07-16 DIAGNOSIS — M2041 Other hammer toe(s) (acquired), right foot: Secondary | ICD-10-CM | POA: Insufficient documentation

## 2020-07-16 DIAGNOSIS — Z8551 Personal history of malignant neoplasm of bladder: Secondary | ICD-10-CM | POA: Insufficient documentation

## 2020-07-16 DIAGNOSIS — G4730 Sleep apnea, unspecified: Secondary | ICD-10-CM | POA: Insufficient documentation

## 2020-07-16 DIAGNOSIS — R233 Spontaneous ecchymoses: Secondary | ICD-10-CM | POA: Insufficient documentation

## 2020-07-16 DIAGNOSIS — C4492 Squamous cell carcinoma of skin, unspecified: Secondary | ICD-10-CM | POA: Insufficient documentation

## 2020-07-16 DIAGNOSIS — M79661 Pain in right lower leg: Secondary | ICD-10-CM | POA: Insufficient documentation

## 2020-07-16 DIAGNOSIS — B9681 Helicobacter pylori [H. pylori] as the cause of diseases classified elsewhere: Secondary | ICD-10-CM | POA: Insufficient documentation

## 2020-07-16 DIAGNOSIS — F5101 Primary insomnia: Secondary | ICD-10-CM | POA: Insufficient documentation

## 2020-07-16 DIAGNOSIS — R040 Epistaxis: Secondary | ICD-10-CM | POA: Insufficient documentation

## 2020-07-16 DIAGNOSIS — N201 Calculus of ureter: Secondary | ICD-10-CM | POA: Insufficient documentation

## 2020-07-16 DIAGNOSIS — N200 Calculus of kidney: Secondary | ICD-10-CM | POA: Insufficient documentation

## 2020-07-16 DIAGNOSIS — I10 Essential (primary) hypertension: Secondary | ICD-10-CM

## 2020-07-16 DIAGNOSIS — R001 Bradycardia, unspecified: Secondary | ICD-10-CM | POA: Insufficient documentation

## 2020-07-16 DIAGNOSIS — E291 Testicular hypofunction: Secondary | ICD-10-CM | POA: Insufficient documentation

## 2020-07-16 NOTE — Telephone Encounter (Signed)
LVM for pt to call office to schedule RH follow-up with Dr. Agustin Cree.

## 2020-07-18 ENCOUNTER — Encounter: Payer: Self-pay | Admitting: Family Medicine

## 2020-07-19 ENCOUNTER — Other Ambulatory Visit: Payer: Self-pay

## 2020-07-19 ENCOUNTER — Ambulatory Visit (INDEPENDENT_AMBULATORY_CARE_PROVIDER_SITE_OTHER): Payer: Medicare Other | Admitting: Cardiology

## 2020-07-19 ENCOUNTER — Encounter: Payer: Self-pay | Admitting: Cardiology

## 2020-07-19 VITALS — BP 122/84 | HR 77 | Ht 75.0 in | Wt 239.0 lb

## 2020-07-19 DIAGNOSIS — Z950 Presence of cardiac pacemaker: Secondary | ICD-10-CM

## 2020-07-19 DIAGNOSIS — I495 Sick sinus syndrome: Secondary | ICD-10-CM

## 2020-07-19 DIAGNOSIS — I480 Paroxysmal atrial fibrillation: Secondary | ICD-10-CM | POA: Diagnosis not present

## 2020-07-19 DIAGNOSIS — I5032 Chronic diastolic (congestive) heart failure: Secondary | ICD-10-CM | POA: Diagnosis not present

## 2020-07-19 DIAGNOSIS — Z8551 Personal history of malignant neoplasm of bladder: Secondary | ICD-10-CM | POA: Diagnosis not present

## 2020-07-19 NOTE — Progress Notes (Signed)
  Subjective:  Patient ID: Paul Kent, male    DOB: 1941-04-13,  MRN: [id number]  Chief Complaint  Patient presents with  . Foot Ulcer    The right heel is doing better and the pad does help and my wife has been dressing it and does hurt with shoes     80 y.o. male presents for wound care. Hx confirmed with patient.  Objective:  Physical Exam: Wound Location: right heel Wound Measurement: 2x1 Wound Base: Granular/Healthy Peri-wound: Reddened, Macerated Exudate: Scant/small amount Serosanguinous exudate Assessment:   1. Ulcer of heel, right, limited to breakdown of skin ([place])   2. Other granulomatous disorders of the skin and subcutaneous tissue    Plan:  Patient was evaluated and treated and all questions answered.  Wound left great toe s/p avulsion, right heel ulcer  -Right heel ulcer likely improving measuring 1 x 2 today which is similar to last measurement.  Thinks that it looks better and is draining less.  The wound was cauterized with silver nitrate to promote epithelialization.  No follow-ups on file.

## 2020-07-19 NOTE — Patient Instructions (Signed)
Medication Instructions:  Your physician recommends that you continue on your current medications as directed. Please refer to the Current Medication list given to you today.  *If you need a refill on your cardiac medications before your next appointment, please call your pharmacy*   Lab Work: Your physician recommends that you return for lab work today : pro bnp, bmp   If you have labs (blood work) drawn today and your tests are completely normal, you will receive your results only by: MyChart Message (if you have MyChart) OR A paper copy in the mail If you have any lab test that is abnormal or we need to change your treatment, we will call you to review the results.   Testing/Procedures: None   Follow-Up: At [facility], you and your health needs are our priority.  As part of our continuing mission to provide you with exceptional heart care, we have created designated Provider Care Teams.  These Care Teams include your primary Cardiologist (physician) and Advanced Practice Providers (APPs -  Physician Assistants and Nurse Practitioners) who all work together to provide you with the care you need, when you need it.  We recommend signing up for the patient portal called "MyChart".  Sign up information is provided on this After Visit Summary.  MyChart is used to connect with patients for Virtual Visits (Telemedicine).  Patients are able to view lab/test results, encounter notes, upcoming appointments, etc.  Non-urgent messages can be sent to your provider as well.   To learn more about what you can do with MyChart, go to [email or web address].    Your next appointment:   2 month(s)  The format for your next appointment:   In Person  Provider:   Robert Krasowski, MD   Other Instructions   

## 2020-07-19 NOTE — Progress Notes (Signed)
Cardiology Office Note:    Date:  07/19/2020   ID:  Paul Kent, Paul Kent 1941-04-30, MRN [id number]  PCP:  [name], MD  Cardiologist:  Jenne Campus, MD    Referring MD: [name], MD   Chief Complaint  Patient presents with  . pulmonary edema    History of Present Illness:    Paul Kent is a 80 y.o. male with past medical history significant for diastolic congestive heart failure, paroxysmal atrial fibrillation, status post ablation done few years ago by Dr. Minna Merritts from [facility] [place], then in December he did have another ablation done, shortly after that he went back to atrial fibrillation and then was put on amiodarone.  Couple weeks later he did have cardioversion, however, next day he showed up in our hospital because of shortness of breath, he did have flash pulmonary edema after cardioversion.  He was diuresed echocardiogram being done which showed preserved left ventricle ejection fraction, he did have some diastolic dysfunction with pseudonormal flow pattern.  Diuretics seems to be helping and he was discharged home.  He comes today to my for follow-up.  Overall he is doing well does have some swelling of lower extremities also have unhealing wounds on his right heel.  Denies have any chest pain tightness squeezing pressure burning chest.  Overall he said he feels better he thinks he is in sinus rhythm.  EKG today showed sinus rhythm however I cannot rule out completely possibility of atrial flutter with 2:1 AV conduction.  His ventricle is being paced.  Past Medical History:  Diagnosis Date  . Acquired bilateral hammer toes   . Arthralgia of left temporomandibular joint   . Atrial fibrillation ([place]) 05/2016  . Bladder cancer ([place])   . Bradycardia    LOW HEART RATE  . Calculus of ureter   . Cellulitis of right lower limb   . CHF (congestive heart failure) ([place]) 02/2018  . Chronic atrial fibrillation ([place])   . Corns and callosities   . Disturbances of salivary  secretion   . Epistaxis   . Essential hypertension   . Essential tremor   . H pylori ulcer   . Hesitancy of micturition   . Hyperlipidemia   . Hypothyroidism   . Impacted cerumen, bilateral   . Jaw pain   . Localized edema   . Male erectile disorder   . Malignant neoplasm of overlapping sites of bladder ([place])   . Other constipation   . Other fatigue   . Overweight   . Pain in right finger(s)   . Pain in right lower leg   . Paroxysmal atrial fibrillation ([place]) 06/08/2016  . Peptic ulcer disease   . Persistent atrial fibrillation ([place]) 06/01/2017  . Primary insomnia   . Renal stones   . Renal stones   . 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 Shingles 06/2012  . Shortness of breath   . Sick sinus syndrome ([place])   . Sinus bradycardia 03/07/2017  . Sleep apnea    dx 08/2015, CPAP  . Spontaneous ecchymoses   . Squamous cell carcinoma of skin    UNSPECIFIED  . Testicular hypofunction   . Type 2 diabetes mellitus ([place]) 1994   UNCONTROLLED, COMPLICATIONS INCLUDE NEPHROPATHY AND PERIPHERAL NEUROPATHY  . Type 2 diabetes mellitus with circulatory disorder, 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[place]) 01/17/2017    Past Surgical History:  Procedure Laterality Date  . ABLATION  06/2017  .  ATRIAL FIBRILLATION ABLATION N/A 05/21/2020   Procedure: ATRIAL FIBRILLATION ABLATION;  Surgeon: Constance Haw, MD;  Location: [place] CV LAB;  Service: Cardiovascular;  Laterality: N/A;  . CARDIOVERSION N/A 03/22/2018   Procedure: CARDIOVERSION;  Surgeon: Buford Dresser, MD;  Location: [facility] ENDOSCOPY;  Service: Cardiovascular;  Laterality: N/A;  . CARDIOVERSION N/A 07/08/2020   Procedure: CARDIOVERSION;  Surgeon: Jerline Pain, MD;  Location: [facility] ENDOSCOPY;  Service: Cardiovascular;  Laterality: N/A;  . HEMIARTHROPLASTY HIP  10/31/2013   IT HIP BIPOLAR  . LUMBAR SPINE SURGERY    . PACEMAKER PLACEMENT  01/29/2017  .  SHOULDER SURGERY Right     Current Medications: Current Meds  Medication Sig  . amiodarone (PACERONE) 200 MG tablet Take 1 tablet (200 mg total) by mouth daily.  Marland Kitchen atorvastatin (LIPITOR) 10 MG tablet TAKE ONE TABLET BY MOUTH EVERY EVENING  . carvedilol (COREG) 12.5 MG tablet Take 0.5 tablets (6.26 mg total) by mouth 2 (two) times daily with a meal.  . furosemide (LASIX) 40 MG tablet Take 40 mg by mouth.  . gabapentin (NEURONTIN) 300 MG capsule TAKE ONE CAPSULE BY MOUTH 3 TIMES DAILY  . glipiZIDE (GLUCOTROL XL) 10 MG 24 hr tablet TAKE ONE TABLET BY MOUTH TWICE DAILY  . levofloxacin (LEVAQUIN) 250 MG tablet Take 250 mg by mouth daily.  Marland Kitchen levothyroxine (SYNTHROID) 137 MCG tablet TAKE ONE TABLET BY MOUTH EVERY MORNING  . potassium chloride (KLOR-CON) 20 MEQ packet Take 20 mEq by mouth daily.  Marland Kitchen testosterone cypionate (DEPOTESTOSTERONE CYPIONATE) 200 MG/ML injection INJECT 1 MILLILITER IN THE MUSCLE EVERY 2 WEEKS  . XARELTO 20 MG TABS tablet TAKE ONE TABLET BY MOUTH EVERY EVENING  . zolpidem (AMBIEN) 10 MG tablet Take 1 tablet (10 mg total) by mouth at bedtime as needed for sleep.   Current Facility-Administered Medications for the 07/19/20 encounter (Office Visit) with Park Liter, MD  Medication  . triamcinolone acetonide (KENALOG-40) injection 20 mg     Allergies:   Propranolol, Clonidine derivatives, Hydralazine hcl, Invokana [canagliflozin], Metformin and related, and Topamax [topiramate]   Social History   Socioeconomic History  . Marital status: Married    Spouse name: Paul Kent  . Number of children: 3  . Years of education: Not on file  . Highest education level: Not on file  Occupational History  . Occupation: RETIRED    Comment: school principal  Tobacco Use  . Smoking status: Never Smoker  . Smokeless tobacco: Never Used  Vaping Use  . Vaping Use: Never used  Substance and Sexual Activity  . Alcohol use: Not Currently  . Drug use: Never  . Sexual activity: Not  on file  Other Topics Concern  . Not on file  Social History Narrative   Lives with wife   Social Determinants of Health   Financial Resource Strain: Not on file  Food Insecurity: Not on file  Transportation Needs: Not on file  Physical Activity: Not on file  Stress: Not on file  Social Connections: Not on file     Family History: The patient's family history includes Arthritis in an other family member; Congestive Heart Failure in an other family member; Diabetes type II in an other family member; Heart Problems in his father; Heart attack in his mother; Hyperlipidemia in an other family member; Transient ischemic attack in his mother; Tuberculosis in his brother. ROS:   Please see the history of present illness.    All 14 point review of systems negative except as described  per history of present illness  EKGs/Labs/Other Studies Reviewed:      Recent Labs: 09/22/2019: TSH 3.180 06/30/2020: Hemoglobin 16.0; Platelets 151 07/13/2020: ALT 12; BUN 29; Creatinine, Ser 1.74; Potassium 4.9; Sodium 141  Recent Lipid Panel    Component Value Date/Time   CHOL 105 06/30/2020 0807   TRIG 55 06/30/2020 0807   HDL 41 06/30/2020 0807   CHOLHDL 2.6 06/30/2020 0807   LDLCALC 51 06/30/2020 0807    Physical Exam:    VS:  BP 122/84 (BP Location: Left Arm, Patient Position: Sitting)   Pulse 77   Ht 6\' 3"  (1.905 m)   Wt 239 lb (108.4 kg)   SpO2 95%   BMI 29.87 kg/m     Wt Readings from Last 3 Encounters:  07/19/20 239 lb (108.4 kg)  07/14/20 238 lb 9.6 oz (108.2 kg)  07/13/20 238 lb (108 kg)     GEN:  Well nourished, well developed in no acute distress HEENT: Normal NECK: No JVD; No carotid bruits LYMPHATICS: No lymphadenopathy CARDIAC: RRR, no murmurs, no rubs, no gallops RESPIRATORY:  Clear to auscultation without rales, wheezing or rhonchi  ABDOMEN: Soft, non-tender, non-distended MUSCULOSKELETAL:  No edema; No deformity  SKIN: Warm and dry LOWER EXTREMITIES: Minimal  swelling NEUROLOGIC:  Alert and oriented x 3 PSYCHIATRIC:  Normal affect   ASSESSMENT:    1. Paroxysmal atrial fibrillation (HCC)   2. Chronic diastolic congestive heart failure ([place])   3. Sick sinus syndrome (HCC)   4. Cardiac pacemaker in situ    PLAN:    In order of problems listed above:  1. Paroxysmal atrial fibrillation I will call [employer] rep to talk about pacemaker interrogation trying to see if potentially he is in 2-1, he is anticoagulated which I will continue.  We will continue with amiodarone 200 mg daily. 2. Chronic diastolic congestive heart he seems to be minimally decompensated today.  His dose of diuretic has been decreased because of kidney dysfunction.  I will check his Chem-7 as well as proBNP today.  In the meantime we will continue half of the dose of furosemide as well as potassium full dose as he was instructed by his primary care physician just few days ago. 3. Sick sinus syndrome that being addressed with the pacemaker.  This is a [employer] device normal function will call pacemaker rep to see exactly what the setting is and what is the conduction is.  He is scheduled to see EP at the beginning of March.   Medication Adjustments/Labs and Tests Ordered: Current medicines are reviewed at length with the patient today.  Concerns regarding medicines are outlined above.  No orders of the defined types were placed in this encounter.  Medication changes: No orders of the defined types were placed in this encounter.   Signed, Park Liter, MD, [facility] 07/19/2020 10:44 AM    [place]

## 2020-07-20 ENCOUNTER — Telehealth: Payer: Self-pay | Admitting: Emergency Medicine

## 2020-07-20 DIAGNOSIS — Z79899 Other long term (current) drug therapy: Secondary | ICD-10-CM

## 2020-07-20 DIAGNOSIS — I5032 Chronic diastolic (congestive) heart failure: Secondary | ICD-10-CM

## 2020-07-20 LAB — BASIC METABOLIC PANEL
BUN/Creatinine Ratio: 17 (ref 10–24)
BUN: 26 mg/dL (ref 8–27)
CO2: 23 mmol/L (ref 20–29)
Calcium: 8.8 mg/dL (ref 8.6–10.2)
Chloride: 105 mmol/L (ref 96–106)
Creatinine, Ser: 1.55 mg/dL — ABNORMAL HIGH (ref 0.76–1.27)
GFR calc Af Amer: 48 mL/min/{1.73_m2} — ABNORMAL LOW (ref >59)
GFR calc non Af Amer: 41 mL/min/{1.73_m2} — ABNORMAL LOW (ref >59)
Glucose: 192 mg/dL — ABNORMAL HIGH (ref 65–99)
Potassium: 5.3 mmol/L — ABNORMAL HIGH (ref 3.5–5.2)
Sodium: 139 mmol/L (ref 134–144)

## 2020-07-20 LAB — PRO B NATRIURETIC PEPTIDE: NT-Pro BNP: 1998 pg/mL — ABNORMAL HIGH (ref 0–486)

## 2020-07-20 MED ORDER — POTASSIUM CHLORIDE ER 10 MEQ PO TBCR: 10.0000 meq | EXTENDED_RELEASE_TABLET | Freq: Every day | ORAL | 1 refills | Status: DC

## 2020-07-20 MED ORDER — FUROSEMIDE 40 MG PO TABS: 60.0000 mg | ORAL_TABLET | Freq: Every day | ORAL | 1 refills | Status: DC

## 2020-07-20 NOTE — Telephone Encounter (Signed)
-----   Message from Park Liter, MD sent at 07/20/2020 12:47 PM EST ----- Still some evidence of congestive heart failure, please increase dose of Lasix from 40 to 60 mg daily, potassium is elevated, please cut down his potassium in half, he need to have proBNP as well as Chem-7 done within a week

## 2020-07-29 DIAGNOSIS — Z79899 Other long term (current) drug therapy: Secondary | ICD-10-CM | POA: Diagnosis not present

## 2020-07-29 DIAGNOSIS — I5032 Chronic diastolic (congestive) heart failure: Secondary | ICD-10-CM | POA: Diagnosis not present

## 2020-07-30 LAB — BASIC METABOLIC PANEL
BUN/Creatinine Ratio: 20 (ref 10–24)
BUN: 37 mg/dL — ABNORMAL HIGH (ref 8–27)
CO2: 22 mmol/L (ref 20–29)
Calcium: 8.9 mg/dL (ref 8.6–10.2)
Chloride: 100 mmol/L (ref 96–106)
Creatinine, Ser: 1.84 mg/dL — ABNORMAL HIGH (ref 0.76–1.27)
GFR calc Af Amer: 39 mL/min/{1.73_m2} — ABNORMAL LOW (ref >59)
GFR calc non Af Amer: 34 mL/min/{1.73_m2} — ABNORMAL LOW (ref >59)
Glucose: 173 mg/dL — ABNORMAL HIGH (ref 65–99)
Potassium: 5.5 mmol/L — ABNORMAL HIGH (ref 3.5–5.2)
Sodium: 138 mmol/L (ref 134–144)

## 2020-07-30 LAB — PRO B NATRIURETIC PEPTIDE: NT-Pro BNP: 844 pg/mL — ABNORMAL HIGH (ref 0–486)

## 2020-08-02 NOTE — Telephone Encounter (Signed)
-----   Message from Park Liter, MD sent at 08/02/2020 10:10 AM EST ----- Congestive heart failure seems to be improving based on blood tests, his potassium is elevated, please stop potassium please repeat Chem-7 within next 5 days

## 2020-08-02 NOTE — Telephone Encounter (Signed)
Patient aware of results, agrees with Dr. Agustin Cree plan to stop Potassium and will stop by on Friday to repeat labs. Order completed.

## 2020-08-04 ENCOUNTER — Other Ambulatory Visit: Payer: Self-pay | Admitting: Family Medicine

## 2020-08-04 ENCOUNTER — Other Ambulatory Visit: Payer: Self-pay | Admitting: Physician Assistant

## 2020-08-06 DIAGNOSIS — E875 Hyperkalemia: Secondary | ICD-10-CM | POA: Diagnosis not present

## 2020-08-06 DIAGNOSIS — I5032 Chronic diastolic (congestive) heart failure: Secondary | ICD-10-CM | POA: Diagnosis not present

## 2020-08-06 DIAGNOSIS — I10 Essential (primary) hypertension: Secondary | ICD-10-CM | POA: Diagnosis not present

## 2020-08-06 LAB — BASIC METABOLIC PANEL
BUN/Creatinine Ratio: 20 (ref 10–24)
BUN: 39 mg/dL — ABNORMAL HIGH (ref 8–27)
CO2: 21 mmol/L (ref 20–29)
Calcium: 8.9 mg/dL (ref 8.6–10.2)
Chloride: 101 mmol/L (ref 96–106)
Creatinine, Ser: 1.95 mg/dL — ABNORMAL HIGH (ref 0.76–1.27)
GFR calc Af Amer: 36 mL/min/{1.73_m2} — ABNORMAL LOW (ref >59)
GFR calc non Af Amer: 31 mL/min/{1.73_m2} — ABNORMAL LOW (ref >59)
Glucose: 217 mg/dL — ABNORMAL HIGH (ref 65–99)
Potassium: 4.9 mmol/L (ref 3.5–5.2)
Sodium: 137 mmol/L (ref 134–144)

## 2020-08-09 ENCOUNTER — Telehealth: Payer: Self-pay | Admitting: Cardiology

## 2020-08-09 NOTE — Telephone Encounter (Signed)
Patient returning call for lab results. 

## 2020-08-09 NOTE — Telephone Encounter (Signed)
Left message for patient to return call.

## 2020-08-10 ENCOUNTER — Telehealth: Payer: Self-pay | Admitting: Emergency Medicine

## 2020-08-10 ENCOUNTER — Telehealth: Payer: Self-pay | Admitting: Cardiology

## 2020-08-10 DIAGNOSIS — R7989 Other specified abnormal findings of blood chemistry: Secondary | ICD-10-CM

## 2020-08-10 NOTE — Telephone Encounter (Signed)
Please call with lab results at [phone]  Thank you!

## 2020-08-10 NOTE — Telephone Encounter (Signed)
Patient informed of results and recommendations

## 2020-08-10 NOTE — Telephone Encounter (Signed)
Called patient. Informed him of results he will repeat labs in 3 weeks.

## 2020-08-10 NOTE — Telephone Encounter (Signed)
Patient informed of results.  

## 2020-08-10 NOTE — Telephone Encounter (Signed)
-----   Message from Park Liter, MD sent at 08/09/2020  8:35 AM EST ----- Kidney function mildly elevated but relatively stable.  He need to have another Chem-7 done within the next 3 weeks.  Potassium is normal

## 2020-08-11 ENCOUNTER — Other Ambulatory Visit: Payer: Self-pay | Admitting: Family Medicine

## 2020-08-22 NOTE — Progress Notes (Unsigned)
Electrophysiology Office Note   Date:  08/23/2020   ID:  Vedansh, Kerstetter 04-May-1941, MRN [id number]  PCP:  [name], MD  Cardiologist:  Primary Electrophysiologist:  Will [name], MD    No chief complaint on file.    History of Present Illness: Paul Kent is a 80 y.o. male who is being seen today for the evaluation of atrial fibrillation at the request of Cox, Kirsten, MD. Presenting today for electrophysiology evaluation.    He has a history significant for persistent atrial fibrillation, hypertension, hyperlipidemia, obstructive sleep apnea, type 2 diabetes, sick sinus syndrome status post Biotronik pacemaker.  He had atrial fibrillation cryoablation January 2019.  Post ablation, he was switched from flecainide to amiodarone.  He had a repeat ablation 05/21/2020.  Unfortunately he continued to have episodes of atrial fibrillation requiring cardioversion.  Unfortunately, he had flash pulmonary edema after cardioversion.  He was admitted to [facility]..  Today, denies symptoms of palpitations, chest pain, shortness of breath, orthopnea, PND, lower extremity edema, claudication, dizziness, presyncope, syncope, bleeding, or neurologic sequela. The patient is tolerating medications without difficulties.  He is continued to have episodes of fatigue and weakness as well as some mild chest discomfort and intermittent edema.  Unfortunately he is in an atrial tachycardia today.  He does continue to go to the gym, but has good days and bad days.   Past Medical History:  Diagnosis Date  . Acquired bilateral hammer toes   . Arthralgia of left temporomandibular joint   . Atrial fibrillation ([address] [address]) 05/2016  . Bladder cancer ([place])   . Bradycardia    LOW HEART RATE  . Calculus of ureter   . Cellulitis of right lower limb   . CHF (congestive heart failure) ([place]) 02/2018  . Chronic atrial fibrillation ([place])   . Corns and callosities   . Disturbances of  salivary secretion   . Epistaxis   . Essential hypertension   . Essential tremor   . H pylori ulcer   . Hesitancy of micturition   . Hyperlipidemia   . Hypothyroidism   . Impacted cerumen, bilateral   . Jaw pain   . Localized edema   . Male erectile disorder   . Malignant neoplasm of overlapping sites of bladder ([place])   . Other constipation   . Other fatigue   . Overweight   . Pain in right finger(s)   . Pain in right lower leg   . Paroxysmal atrial fibrillation ([place]) 06/08/2016  . Peptic ulcer disease   . Persistent atrial fibrillation ([place]) 06/01/2017  . Primary insomnia   . Renal stones   . Renal stones   . 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 Shingles 06/2012  . Shortness of breath   . Sick sinus syndrome ([place])   . Sinus bradycardia 03/07/2017  . Sleep apnea    dx 08/2015, CPAP  . Spontaneous ecchymoses   . Squamous cell carcinoma of skin    UNSPECIFIED  . Testicular hypofunction   . Type 2 diabetes mellitus ([place]) 1994   UNCONTROLLED, COMPLICATIONS INCLUDE NEPHROPATHY AND PERIPHERAL NEUROPATHY  . Type 2 diabetes mellitus with circulatory disorder, 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[place]) 01/17/2017   Past Surgical History:  Procedure Laterality Date  . ABLATION  06/2017  . ATRIAL FIBRILLATION ABLATION N/A 05/21/2020   Procedure: ATRIAL FIBRILLATION ABLATION;  Surgeon: Constance Haw, MD;  Location: [facility];  Service: Cardiovascular;  Laterality: N/A;  . CARDIOVERSION N/A 03/22/2018   Procedure: CARDIOVERSION;  Surgeon: Buford Dresser, MD;  Location: [facility] ENDOSCOPY;  Service: Cardiovascular;  Laterality: N/A;  . CARDIOVERSION N/A 07/08/2020   Procedure: CARDIOVERSION;  Surgeon: Jerline Pain, MD;  Location: [facility] ENDOSCOPY;  Service: Cardiovascular;  Laterality: N/A;  . HEMIARTHROPLASTY HIP  10/31/2013   IT HIP BIPOLAR  . LUMBAR SPINE SURGERY    . PACEMAKER PLACEMENT  01/29/2017   . SHOULDER SURGERY Right      Current Outpatient Medications  Medication Sig Dispense Refill  . amiodarone (PACERONE) 200 MG tablet TAKE ONE TABLET BY MOUTH EVERY DAY 90 tablet 0  . atorvastatin (LIPITOR) 10 MG tablet TAKE ONE TABLET BY MOUTH EVERY EVENING 90 tablet 2  . carvedilol (COREG) 12.5 MG tablet Take 0.5 tablets (6.26 mg total) by mouth 2 (two) times daily with a meal. 90 tablet 3  . furosemide (LASIX) 40 MG tablet Take 1.5 tablets (60 mg total) by mouth daily. 45 tablet 1  . gabapentin (NEURONTIN) 300 MG capsule TAKE ONE CAPSULE BY MOUTH 3 TIMES DAILY 90 capsule 5  . glipiZIDE (GLUCOTROL XL) 10 MG 24 hr tablet TAKE ONE TABLET BY MOUTH TWICE DAILY 180 tablet 1  . levothyroxine (SYNTHROID) 137 MCG tablet TAKE ONE TABLET BY MOUTH EVERY MORNING 90 tablet 1  . silver sulfADIAZINE (SILVADENE) 1 % cream Apply pea-sized amount to wound daily. 50 g 0  . testosterone cypionate (DEPOTESTOSTERONE CYPIONATE) 200 MG/ML injection INJECT 1 MILLILITER IN THE MUSCLE EVERY 2 WEEKS 10 mL 1  . XARELTO 20 MG TABS tablet TAKE ONE TABLET BY MOUTH EVERY EVENING 90 tablet 1  . zolpidem (AMBIEN) 10 MG tablet Take 1 tablet (10 mg total) by mouth at bedtime as needed for sleep. 30 tablet 1   Current Facility-Administered Medications  Medication Dose Route Frequency Provider Last Rate Last Admin  . triamcinolone acetonide (KENALOG-40) injection 20 mg  20 mg Intra-articular Once Cox, Kirsten, MD        Allergies:   Propranolol, Clonidine derivatives, Hydralazine hcl, Invokana [canagliflozin], Metformin and related, and Topamax [topiramate]   Social History:  The patient  reports that he has never smoked. He has never used smokeless tobacco. He reports previous alcohol use. He reports that he does not use drugs.   Family History:  The patient's family history includes Arthritis in an other family member; Congestive Heart Failure in an other family member; Diabetes type II in an other family member; Heart  Problems in his father; Heart attack in his mother; Hyperlipidemia in an other family member; Transient ischemic attack in his mother; Tuberculosis in his brother.    ROS:  Please see the history of present illness.   Otherwise, review of systems is positive for none.   All other systems are reviewed and negative.   PHYSICAL EXAM: VS:  BP 128/72   Pulse 68   Ht 6\' 3"  (1.905 m)   Wt 233 lb (105.7 kg)   SpO2 98%   BMI 29.12 kg/m  , BMI Body mass index is 29.12 kg/m. GEN: Well nourished, well developed, in no acute distress  HEENT: normal  Neck: no JVD, carotid bruits, or masses Cardiac: RRR; no murmurs, rubs, or gallops,no edema  Respiratory:  clear to auscultation bilaterally, normal work of breathing GI: soft, nontender, nondistended, + BS MS: no deformity or atrophy  Skin: warm and dry, device site well healed Neuro:  Strength and sensation are intact Psych: euthymic  mood, full affect  EKG:  EKG is ordered today. Personal review of the ekg ordered shows atrial tachycardia is of 3, ventricular paced  Personal review of the device interrogation today. Results in [place]: 09/22/2019: TSH 3.180 06/30/2020: Hemoglobin 16.0; Platelets 151 07/13/2020: ALT 12 07/29/2020: NT-Pro BNP 844 08/06/2020: BUN 39; Creatinine, Ser 1.95; Potassium 4.9; Sodium 137    Lipid Panel     Component Value Date/Time   CHOL 105 06/30/2020 0807   TRIG 55 06/30/2020 0807   HDL 41 06/30/2020 0807   CHOLHDL 2.6 06/30/2020 0807   LDLCALC 51 06/30/2020 0807     Wt Readings from Last 3 Encounters:  08/23/20 233 lb (105.7 kg)  07/19/20 239 lb (108.4 kg)  07/14/20 238 lb 9.6 oz (108.2 kg)      Other studies Reviewed: Additional studies/ records that were reviewed today include: TTE 03/01/18  Review of the above records today demonstrates:  - Left ventricle: The cavity size was normal. Wall thickness was   increased in a pattern of mild LVH. Systolic function was mildly   reduced. The  estimated ejection fraction was in the range of 45%   to 50%. Diffuse hypokinesis. - Left atrium: The atrium was mildly dilated. Volume/bsa, S: 37.6   ml/m^2. - Right ventricle: The cavity size was moderately dilated. Wall   thickness was normal. Pacer wire or catheter noted in right   ventricle.   ASSESSMENT AND PLAN:  1.  Persistent atrial fibrillation: Currently on Xarelto and amiodarone.  High risk medication monitoring.  Status post cryoablation in 06/20/2017.  CHA2DS2-VASc of 4.  Is now status post repeat RF ablation on 05/21/2020.  Unfortunately he had more episodes of atrial fibrillation and required cardioversion.  He is in atrial tachycardia today.  He is currently on amiodarone.  He has had a Foley.  Due to that, we will plan for repeat cardioversion.  2.  Hypertension: Currently well controlled  3.  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  Device functioning appropriately.  No changes at this time.  4.  Obstructive sleep apnea: CPAP compliance encouraged  5.  Morbid obesity: Weight loss with diet and exercise encouraged  6.  Hyperlipidemia: Continue Lipitor   Current medicines are reviewed at length with the patient today.   The patient does not have concerns regarding his medicines.  The following changes were made today: None  Labs/ tests ordered today include:  Orders Placed This Encounter  Procedures  . Basic metabolic panel  . CBC  . EKG 12-Lead   Disposition:   FU with Will Camnitz 3 months  Signed, Will [name], MD  08/23/2020 10:30 AM     [facility] [address] [place] [address] [phone] (office) [phone] (fax)

## 2020-08-23 ENCOUNTER — Encounter: Payer: Self-pay | Admitting: Cardiology

## 2020-08-23 ENCOUNTER — Encounter: Payer: Self-pay | Admitting: *Deleted

## 2020-08-23 ENCOUNTER — Ambulatory Visit (INDEPENDENT_AMBULATORY_CARE_PROVIDER_SITE_OTHER): Payer: Medicare Other | Admitting: Cardiology

## 2020-08-23 ENCOUNTER — Other Ambulatory Visit: Payer: Self-pay

## 2020-08-23 VITALS — BP 128/72 | HR 68 | Ht 75.0 in | Wt 233.0 lb

## 2020-08-23 DIAGNOSIS — I4819 Other persistent atrial fibrillation: Secondary | ICD-10-CM | POA: Diagnosis not present

## 2020-08-23 DIAGNOSIS — Z01812 Encounter for preprocedural laboratory examination: Secondary | ICD-10-CM | POA: Diagnosis not present

## 2020-08-23 DIAGNOSIS — R001 Bradycardia, unspecified: Secondary | ICD-10-CM | POA: Diagnosis not present

## 2020-08-23 NOTE — Patient Instructions (Addendum)
Medication Instructions:  Your physician recommends that you continue on your current medications as directed. Please refer to the Current Medication list given to you today.  *If you need a refill on your cardiac medications before your next appointment, please call your pharmacy*   Lab Work: None ordered If you have labs (blood work) drawn today and your tests are completely normal, you will receive your results only by: Marland Kitchen MyChart Message (if you have MyChart) OR . A paper copy in the mail If you have any lab test that is abnormal or we need to change your treatment, we will call you to review the results.   Testing/Procedures: Your physician has recommended that you have a Cardioversion (DCCV). Electrical Cardioversion uses a jolt of electricity to your heart either through paddles or wired patches attached to your chest. This is a controlled, usually prescheduled, procedure. Defibrillation is done under light anesthesia in the hospital, and you usually go home the day of the procedure. This is done to get your heart back into a normal rhythm. You are not awake for the procedure. Please see the instructions below located under "other instructions".    Follow-Up: At [facility], you and your health needs are our priority.  As part of our continuing mission to provide you with exceptional heart care, we have created designated Provider Care Teams.  These Care Teams include your primary Cardiologist (physician) and Advanced Practice Providers (APPs -  Physician Assistants and Nurse Practitioners) who all work together to provide you with the care you need, when you need it.  Remote monitoring is used to monitor your Pacemaker or ICD from home. This monitoring reduces the number of office visits required to check your device to one time per year. It allows [place] to keep an eye on the functioning of your device to ensure it is working properly. You are scheduled for a device check from home on  09/09/2020. You may send your transmission at any time that day. If you have a wireless device, the transmission will be sent automatically. After your physician reviews your transmission, you will receive a postcard with your next transmission date.  Your next appointment:   3 month(s)  The format for your next appointment:   In Person  Provider:   Allegra Lai, MD   Thank you for choosing [facility]!!   Trinidad Curet, RN [phone]    Other Instructions  STOP by [facility] for Covid screening on Thursday/Friday.  Please give the letter given to you today.  You are scheduled for a Cardioversion on 09/02/2020 with Dr. Agustin Cree.  Please arrive to [facility] at 7:30 am.  DIET: Nothing to eat or drink after midnight except a sip of water with medications (see medication instructions below)  Medication Instructions: Hold all medications the morning of this procedure.  Continue your anticoagulant: Xarelto You will need to continue your anticoagulant after your procedure until you are told by your provider that it is safe to stop   Labs: today - BMET & CBC  You must have a responsible person to drive you home and stay in the waiting area during your procedure. Failure to do so could result in cancellation.  Bring your insurance cards.  *Special Note: Every effort is made to have your procedure done on time. Occasionally there are emergencies that occur at the hospital that may cause delays. Please be patient if a delay does occur.

## 2020-08-24 LAB — BASIC METABOLIC PANEL
BUN/Creatinine Ratio: 15 (ref 10–24)
BUN: 25 mg/dL (ref 8–27)
CO2: 24 mmol/L (ref 20–29)
Calcium: 8.6 mg/dL (ref 8.6–10.2)
Chloride: 101 mmol/L (ref 96–106)
Creatinine, Ser: 1.7 mg/dL — ABNORMAL HIGH (ref 0.76–1.27)
Glucose: 248 mg/dL — ABNORMAL HIGH (ref 65–99)
Potassium: 4.7 mmol/L (ref 3.5–5.2)
Sodium: 139 mmol/L (ref 134–144)
eGFR: 40 mL/min/{1.73_m2} — ABNORMAL LOW (ref >59)

## 2020-08-24 LAB — CBC
Hematocrit: 47.2 % (ref 37.5–51.0)
Hemoglobin: 15.8 g/dL (ref 13.0–17.7)
MCH: 32.4 pg (ref 26.6–33.0)
MCHC: 33.5 g/dL (ref 31.5–35.7)
MCV: 97 fL (ref 79–97)
Platelets: 150 10*3/uL (ref 150–450)
RBC: 4.88 x10E6/uL (ref 4.14–5.80)
RDW: 12.7 % (ref 11.6–15.4)
WBC: 4.9 10*3/uL (ref 3.4–10.8)

## 2020-08-26 DIAGNOSIS — Z20828 Contact with and (suspected) exposure to other viral communicable diseases: Secondary | ICD-10-CM | POA: Diagnosis not present

## 2020-09-01 ENCOUNTER — Telehealth: Payer: Self-pay | Admitting: Cardiology

## 2020-09-01 NOTE — Telephone Encounter (Signed)
Santiago Glad from [facility] called needing recent COVID results for patient. He is scheduled for a procedure at [facility] in the morning at 8:00 AM.  Contact info: Jeani Hawking. [phone] Fax. [phone]

## 2020-09-01 NOTE — Telephone Encounter (Signed)
Spoke to patient he states [facility] did his covid test and they faxed it 3 times to . Called [facility] they confirmed they have faxed 3 times and have confirmation from each time that they will send to [place]. I asked them to refax to the number Becky with [facility] gave me. [place]. [facility] states they will refax. Patient aware of this. Told patient per Jacqlyn Larsen to be at [facility] tomorrow at 630 am and to be NPO after midnight tonight. He understood no further questions.

## 2020-09-01 NOTE — Telephone Encounter (Signed)
Left message for patient to return call.

## 2020-09-02 DIAGNOSIS — I4892 Unspecified atrial flutter: Secondary | ICD-10-CM | POA: Diagnosis not present

## 2020-09-02 DIAGNOSIS — I495 Sick sinus syndrome: Secondary | ICD-10-CM | POA: Diagnosis not present

## 2020-09-02 DIAGNOSIS — Z7984 Long term (current) use of oral hypoglycemic drugs: Secondary | ICD-10-CM | POA: Diagnosis not present

## 2020-09-02 DIAGNOSIS — I471 Supraventricular tachycardia: Secondary | ICD-10-CM | POA: Diagnosis not present

## 2020-09-02 DIAGNOSIS — E119 Type 2 diabetes mellitus without complications: Secondary | ICD-10-CM | POA: Diagnosis not present

## 2020-09-02 DIAGNOSIS — I10 Essential (primary) hypertension: Secondary | ICD-10-CM | POA: Diagnosis not present

## 2020-09-02 DIAGNOSIS — G4733 Obstructive sleep apnea (adult) (pediatric): Secondary | ICD-10-CM | POA: Diagnosis not present

## 2020-09-02 DIAGNOSIS — I480 Paroxysmal atrial fibrillation: Secondary | ICD-10-CM | POA: Diagnosis not present

## 2020-09-02 DIAGNOSIS — I4891 Unspecified atrial fibrillation: Secondary | ICD-10-CM | POA: Diagnosis not present

## 2020-09-02 DIAGNOSIS — I4819 Other persistent atrial fibrillation: Secondary | ICD-10-CM | POA: Diagnosis not present

## 2020-09-02 DIAGNOSIS — E785 Hyperlipidemia, unspecified: Secondary | ICD-10-CM | POA: Diagnosis not present

## 2020-09-02 DIAGNOSIS — Z950 Presence of cardiac pacemaker: Secondary | ICD-10-CM | POA: Diagnosis not present

## 2020-09-03 ENCOUNTER — Other Ambulatory Visit: Payer: Self-pay | Admitting: Cardiology

## 2020-09-03 DIAGNOSIS — I5032 Chronic diastolic (congestive) heart failure: Secondary | ICD-10-CM

## 2020-09-09 ENCOUNTER — Ambulatory Visit (INDEPENDENT_AMBULATORY_CARE_PROVIDER_SITE_OTHER): Payer: Medicare Other

## 2020-09-09 DIAGNOSIS — I495 Sick sinus syndrome: Secondary | ICD-10-CM | POA: Diagnosis not present

## 2020-09-11 LAB — CUP PACEART REMOTE DEVICE CHECK
Date Time Interrogation Session: 20220324181825
Implantable Lead Implant Date: 20180813
Implantable Lead Implant Date: 20180813
Implantable Lead Location: 753859
Implantable Lead Location: 753860
Implantable Lead Model: 377
Implantable Lead Model: 377
Implantable Lead Serial Number: 49893169
Implantable Lead Serial Number: 50011411
Implantable Pulse Generator Implant Date: 20180813
Pulse Gen Model: 407145
Pulse Gen Serial Number: 69158272

## 2020-09-15 ENCOUNTER — Other Ambulatory Visit: Payer: Self-pay

## 2020-09-16 ENCOUNTER — Ambulatory Visit (INDEPENDENT_AMBULATORY_CARE_PROVIDER_SITE_OTHER): Payer: Medicare Other | Admitting: Cardiology

## 2020-09-16 ENCOUNTER — Encounter: Payer: Self-pay | Admitting: Cardiology

## 2020-09-16 ENCOUNTER — Other Ambulatory Visit: Payer: Self-pay

## 2020-09-16 VITALS — BP 100/62 | HR 63 | Ht 75.0 in | Wt 232.0 lb

## 2020-09-16 DIAGNOSIS — R0602 Shortness of breath: Secondary | ICD-10-CM | POA: Diagnosis not present

## 2020-09-16 DIAGNOSIS — I5032 Chronic diastolic (congestive) heart failure: Secondary | ICD-10-CM

## 2020-09-16 DIAGNOSIS — Z950 Presence of cardiac pacemaker: Secondary | ICD-10-CM

## 2020-09-16 DIAGNOSIS — Z9889 Other specified postprocedural states: Secondary | ICD-10-CM

## 2020-09-16 DIAGNOSIS — I480 Paroxysmal atrial fibrillation: Secondary | ICD-10-CM | POA: Diagnosis not present

## 2020-09-16 DIAGNOSIS — Z8679 Personal history of other diseases of the circulatory system: Secondary | ICD-10-CM

## 2020-09-16 DIAGNOSIS — E782 Mixed hyperlipidemia: Secondary | ICD-10-CM

## 2020-09-16 HISTORY — DX: Personal history of other diseases of the circulatory system: Z86.79

## 2020-09-16 HISTORY — DX: Other specified postprocedural states: Z98.890

## 2020-09-16 NOTE — Progress Notes (Signed)
Cardiology Office Note:    Date:  09/16/2020   ID:  Estes, Lehner [birth year], MRN [id number]  PCP:  [name], MD  Cardiologist:  Jenne Campus, MD    Referring MD: [name], MD   Chief Complaint  Patient presents with  . follow up cardioversion    History of Present Illness:    Paul Kent is a 80 y.o. male with past medical history significant for paroxysmal/persistent atrial fibrillation, essential hypertension, hyperlipidemia, obstructive sleep apnea, type 2 diabetes, sick sinus syndrome, status post Biotronik pacemaker implantation.  Status post atrial fibrillation cryoablation done first time in January 2019 post ablation he was switched from flecainide to amiodarone he had repeated ablation done on 21 May 2020.  Unfortunately after that he kept having episode of atrial fibrillation and just recently about a week ago he underwent another cardioversion to sinus rhythm cardioversion was very easily he came today it looks like he is today in atrial tachycardia.  He is very dissatisfied and unhappy about this scenario he said it was quite well before now I have always problems.  Overall he said he felt the same, he did not assess any differences between atrial fibrillation/atrial tachycardia all the way he feels right now.  I think the most important issue is that he is very frustrated with all his clinical scenario and failure of everything we do.  Past Medical History:  Diagnosis Date  . Acquired bilateral hammer toes   . Arthralgia of left temporomandibular joint   . Atrial fibrillation ([place]) 05/2016  . Bladder cancer ([place])   . Bradycardia    LOW HEART RATE  . Calculus of ureter   . 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 Cellulitis of right lower limb   . CHF (congestive heart failure) ([place]) 02/2018  . Chronic atrial fibrillation ([place])   . Corns and callosities   . Deviated septum 07/01/2018  . Diabetes mellitus due to underlying condition with  unspecified complications ([place]) 81/82/9937  . Disturbances of salivary secretion   . Epistaxis   . Essential hypertension   . Essential tremor   . H pylori ulcer   . Herpes zoster with nervous system complication 16/96/7893  . Hesitancy of micturition   . Hyperlipidemia   . Hypothyroidism   . Impacted cerumen, bilateral   . Jaw pain   . Lesion of femoral nerve 04/30/2013  . Localized edema   . Male erectile disorder   . Malignant neoplasm of overlapping sites of bladder ([place])   . Nasal congestion 07/01/2018  . Nasal turbinate hypertrophy 07/01/2018  . Obstructive sleep apnea 01/17/2017  . Other constipation   . Other fatigue   . Overweight   . Pacemaker reprogramming/check 02/12/2017  . Pain in right finger(s)   . Pain in right lower leg   . Paroxysmal atrial fibrillation ([place]) 06/08/2016  . Peptic ulcer disease   . Persistent atrial fibrillation ([place]) 06/01/2017  . Primary insomnia   . Renal stones   . Renal stones   . Secondary hypercoagulable state ([place]) 06/17/2020  . 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 Shingles 06/2012  . Shortness of breath   . Sick sinus syndrome ([place])   . Sinus bradycardia 03/07/2017  . Sleep apnea    dx 08/2015, CPAP  . Spontaneous ecchymoses   . Squamous cell carcinoma of skin    UNSPECIFIED  . Testicular hypofunction   . Thoracic or lumbosacral  neuritis or radiculitis 06/03/2013  . Trigger middle finger of right hand 11/28/2019  . Type 2 diabetes mellitus ([place]) 1994   UNCONTROLLED, COMPLICATIONS INCLUDE NEPHROPATHY AND PERIPHERAL NEUROPATHY  . Type 2 diabetes mellitus with circulatory disorder, 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[place]) 01/17/2017    Past Surgical History:  Procedure Laterality Date  . ABLATION  06/2017  . ATRIAL FIBRILLATION ABLATION N/A 05/21/2020   Procedure: ATRIAL FIBRILLATION ABLATION;  Surgeon: Constance Haw, MD;  Location: [facility] LAB;   Service: Cardiovascular;  Laterality: N/A;  . CARDIOVERSION N/A 03/22/2018   Procedure: CARDIOVERSION;  Surgeon: Buford Dresser, MD;  Location: [facility] ENDOSCOPY;  Service: Cardiovascular;  Laterality: N/A;  . CARDIOVERSION N/A 07/08/2020   Procedure: CARDIOVERSION;  Surgeon: Jerline Pain, MD;  Location: [facility] ENDOSCOPY;  Service: Cardiovascular;  Laterality: N/A;  . HEMIARTHROPLASTY HIP  10/31/2013   IT HIP BIPOLAR  . LUMBAR SPINE SURGERY    . PACEMAKER PLACEMENT  01/29/2017  . SHOULDER SURGERY Right     Current Medications: Current Meds  Medication Sig  . amiodarone (PACERONE) 200 MG tablet TAKE ONE TABLET BY MOUTH EVERY DAY (Patient taking differently: Take 200 mg by mouth daily.)  . atorvastatin (LIPITOR) 10 MG tablet TAKE ONE TABLET BY MOUTH EVERY EVENING (Patient taking differently: Take 10 mg by mouth daily.)  . carvedilol (COREG) 12.5 MG tablet Take 0.5 tablets (6.26 mg total) by mouth 2 (two) times daily with a meal.  . furosemide (LASIX) 40 MG tablet Take 1.5 tablets (60 mg total) by mouth daily. (Patient taking differently: Take 60 mg by mouth as needed for fluid.)  . gabapentin (NEURONTIN) 300 MG capsule TAKE ONE CAPSULE BY MOUTH 3 TIMES DAILY (Patient taking differently: Take 300 mg by mouth 2 (two) times daily.)  . glipiZIDE (GLUCOTROL XL) 10 MG 24 hr tablet TAKE ONE TABLET BY MOUTH TWICE DAILY (Patient taking differently: Take 10 mg by mouth in the morning and at bedtime.)  . levothyroxine (SYNTHROID) 137 MCG tablet TAKE ONE TABLET BY MOUTH EVERY MORNING (Patient taking differently: Take 137 mcg by mouth daily before breakfast.)  . XARELTO 20 MG TABS tablet TAKE ONE TABLET BY MOUTH EVERY EVENING (Patient taking differently: Take 20 mg by mouth daily with supper.)  . zolpidem (AMBIEN) 10 MG tablet Take 1 tablet (10 mg total) by mouth at bedtime as needed for sleep.   Current Facility-Administered Medications for the 09/16/20 encounter (Office Visit) with Park Liter, MD   Medication  . triamcinolone acetonide (KENALOG-40) injection 20 mg     Allergies:   Propranolol, Clonidine derivatives, Hydralazine hcl, Invokana [canagliflozin], Metformin and related, and Topamax [topiramate]   Social History   Socioeconomic History  . Marital status: Married    Spouse name: [name]  . Number of children: 3  . Years of education: Not on file  . Highest education level: Not on file  Occupational History  . Occupation: RETIRED    Comment: school principal  Tobacco Use  . Smoking status: Never Smoker  . Smokeless tobacco: Never Used  Vaping Use  . Vaping Use: Never used  Substance and Sexual Activity  . Alcohol use: Not Currently  . Drug use: Never  . Sexual activity: Not on file  Other Topics Concern  . Not on file  Social History Narrative   Lives with wife   Social Determinants of Health   Financial Resource Strain: Not on file  Food Insecurity: Not on file  Transportation Needs: Not on file  Physical Activity: Not on file  Stress: Not on file  Social Connections: Not on file     Family History: The patient's family history includes Arthritis in an other family member; Congestive Heart Failure in an other family member; Diabetes type II in an other family member; Heart Problems in his father; Heart attack in his mother; Hyperlipidemia in an other family member; Transient ischemic attack in his mother; Tuberculosis in his brother. ROS:   Please see the history of present illness.    All 14 point review of systems negative except as described per history of present illness  EKGs/Labs/Other Studies Reviewed:      Recent Labs: 09/22/2019: TSH 3.180 07/13/2020: ALT 12 07/29/2020: NT-Pro BNP 844 08/23/2020: BUN 25; Creatinine, Ser 1.70; Hemoglobin 15.8; Platelets 150; Potassium 4.7; Sodium 139  Recent Lipid Panel    Component Value Date/Time   CHOL 105 06/30/2020 0807   TRIG 55 06/30/2020 0807   HDL 41 06/30/2020 0807   CHOLHDL 2.6 06/30/2020 0807    LDLCALC 51 06/30/2020 0807    Physical Exam:    VS:  BP 100/62 (BP Location: Right Arm, Patient Position: Sitting)   Pulse 63   Ht 6\' 3"  (1.905 m)   Wt 232 lb (105.2 kg)   SpO2 96%   BMI 29.00 kg/m     Wt Readings from Last 3 Encounters:  09/16/20 232 lb (105.2 kg)  08/23/20 233 lb (105.7 kg)  07/19/20 239 lb (108.4 kg)     GEN:  Well nourished, well developed in no acute distress HEENT: Normal NECK: No JVD; No carotid bruits LYMPHATICS: No lymphadenopathy CARDIAC: RRR, no murmurs, no rubs, no gallops RESPIRATORY:  Clear to auscultation without rales, wheezing or rhonchi  ABDOMEN: Soft, non-tender, non-distended MUSCULOSKELETAL:  No edema; No deformity  SKIN: Warm and dry LOWER EXTREMITIES: no swelling NEUROLOGIC:  Alert and oriented x 3 PSYCHIATRIC:  Normal affect   ASSESSMENT:    1. Paroxysmal atrial fibrillation (HCC)   2. Chronic diastolic congestive heart failure (HCC)   3. Cardiac pacemaker in situ   4. Shortness of breath   5. Mixed hyperlipidemia   6. Status post ablation of atrial fibrillation    PLAN:    In order of problems listed above:  1. Paroxysmal atrial fibrillation.  Today he is in atrial tachycardia 2. Interrogation of the device also revealed that.  He is disappointed frustrated with the situation.  I explained to him how it is possible that still all different can of arrhythmia can happen atrial fibrillation ablation usually within the first 3 months.  However, this is already more than 3 months and he is upset about it. 3. Pacemaker in situ.  His Medtronic device we did interrogate the device during the time of cardioversion. 4. Mixed dyslipidemia, on statin which I will continue. 5. Shortness of breath denies having much of this.  Previously after cardioversion he ended going to pulmonary edema at this time after cardioversion I gave him 40 mg Lasix for 2 days and that kept him away from trouble.   Medication Adjustments/Labs and Tests  Ordered: Current medicines are reviewed at length with the patient today.  Concerns regarding medicines are outlined above.  Orders Placed This Encounter  Procedures  . EKG 12-Lead   Medication changes: No orders of the defined types were placed in this encounter.   Signed, Park Liter, MD, [facility] 09/16/2020 12:15 PM    [place]

## 2020-09-16 NOTE — Patient Instructions (Signed)

## 2020-09-20 ENCOUNTER — Other Ambulatory Visit: Payer: Self-pay

## 2020-09-20 ENCOUNTER — Encounter: Payer: Self-pay | Admitting: Cardiology

## 2020-09-20 ENCOUNTER — Ambulatory Visit (INDEPENDENT_AMBULATORY_CARE_PROVIDER_SITE_OTHER): Payer: Medicare Other | Admitting: Cardiology

## 2020-09-20 VITALS — BP 130/72 | HR 78 | Ht 75.0 in | Wt 234.6 lb

## 2020-09-20 DIAGNOSIS — I480 Paroxysmal atrial fibrillation: Secondary | ICD-10-CM

## 2020-09-20 MED ORDER — CARVEDILOL 12.5 MG PO TABS: 12.5000 mg | ORAL_TABLET | Freq: Two times a day (BID) | ORAL | 1 refills | Status: DC

## 2020-09-20 NOTE — Progress Notes (Signed)
Electrophysiology Office Note   Date:  09/20/2020   ID:  Paul, Kent 1940-07-27, MRN [id number]  PCP:  [name], MD  Cardiologist:  Primary Electrophysiologist:  Darin Redmann [name], MD    No chief complaint on file.    History of Present Illness: Paul Kent is a 80 y.o. male who is being seen today for the evaluation of atrial fibrillation at the request of Cox, Kirsten, MD. Presenting today for electrophysiology evaluation.    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  He had an atrial fibrillation cryoablation January 2019.  Post ablation he was switched from flecainide to amiodarone.  He had a repeat ablation 05/21/2020.  Unfortunately he continued to have episodes of atrial fibrillation and atrial flutter.  He is now status post cardioversion x2, but unfortunately has had more frequent episodes.  Today, denies symptoms of palpitations, chest pain, shortness of breath, orthopnea, PND, lower extremity edema, claudication, dizziness, presyncope, syncope, bleeding, or neurologic sequela. The patient is tolerating medications without difficulties.  He continues to have episodic days of fatigue and weakness.  Most days he feels well.  Device interrogation shows a 4% burden.   Past Medical History:  Diagnosis Date  . Acquired bilateral hammer toes   . Arthralgia of left temporomandibular joint   . Atrial fibrillation ([place]) 05/2016  . Bladder cancer ([place])   . Bradycardia    LOW HEART RATE  . Calculus of ureter   . 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 Cellulitis of right lower limb   . CHF (congestive heart failure) ([place]) 02/2018  . Chronic atrial fibrillation ([place])   . Corns and callosities   . Deviated septum 07/01/2018  . Diabetes mellitus due to underlying condition with unspecified complications ([place]) 40/98/1191  .  Disturbances of salivary secretion   . Epistaxis   . Essential hypertension   . Essential tremor   . H pylori ulcer   . Herpes zoster with nervous system complication 47/82/9562  . Hesitancy of micturition   . Hyperlipidemia   . Hypothyroidism   . Impacted cerumen, bilateral   . Jaw pain   . Lesion of femoral nerve 04/30/2013  . Localized edema   . Male erectile disorder   . Malignant neoplasm of overlapping sites of bladder ([place])   . Nasal congestion 07/01/2018  . Nasal turbinate hypertrophy 07/01/2018  . Obstructive sleep apnea 01/17/2017  . Other constipation   . Other fatigue   . Overweight   . Pacemaker reprogramming/check 02/12/2017  . Pain in right finger(s)   . Pain in right lower leg   . Paroxysmal atrial fibrillation ([place]) 06/08/2016  . Peptic ulcer disease   . Persistent atrial fibrillation ([place]) 06/01/2017  . Primary insomnia   . Renal stones   . Renal stones   . Secondary hypercoagulable state ([place]) 06/17/2020  . 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 Shingles 06/2012  . Shortness of breath   . Sick sinus syndrome ([place])   . Sinus bradycardia 03/07/2017  . Sleep apnea    dx 08/2015, CPAP  . Spontaneous ecchymoses   . Squamous cell carcinoma of skin    UNSPECIFIED  . Testicular hypofunction   . Thoracic or lumbosacral neuritis or radiculitis 06/03/2013  . Trigger middle finger of right hand 11/28/2019  . Type 2 diabetes mellitus ([place])  1994   UNCONTROLLED, COMPLICATIONS INCLUDE NEPHROPATHY AND PERIPHERAL NEUROPATHY  . Type 2 diabetes mellitus with circulatory disorder, 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[place]) 01/17/2017   Past Surgical History:  Procedure Laterality Date  . ABLATION  06/2017  . ATRIAL FIBRILLATION ABLATION N/A 05/21/2020   Procedure: ATRIAL FIBRILLATION ABLATION;  Surgeon: Constance Haw, MD;  Location: [facility];  Service: Cardiovascular;  Laterality: N/A;  .  CARDIOVERSION N/A 03/22/2018   Procedure: CARDIOVERSION;  Surgeon: Buford Dresser, MD;  Location: [facility] ENDOSCOPY;  Service: Cardiovascular;  Laterality: N/A;  . CARDIOVERSION N/A 07/08/2020   Procedure: CARDIOVERSION;  Surgeon: Jerline Pain, MD;  Location: [facility] ENDOSCOPY;  Service: Cardiovascular;  Laterality: N/A;  . HEMIARTHROPLASTY HIP  10/31/2013   IT HIP BIPOLAR  . LUMBAR SPINE SURGERY    . PACEMAKER PLACEMENT  01/29/2017  . SHOULDER SURGERY Right      Current Outpatient Medications  Medication Sig Dispense Refill  . amiodarone (PACERONE) 200 MG tablet TAKE ONE TABLET BY MOUTH EVERY DAY 90 tablet 0  . atorvastatin (LIPITOR) 10 MG tablet TAKE ONE TABLET BY MOUTH EVERY EVENING 90 tablet 2  . carvedilol (COREG) 12.5 MG tablet Take 0.5 tablets (6.26 mg total) by mouth 2 (two) times daily with a meal. 90 tablet 3  . furosemide (LASIX) 40 MG tablet Take 1.5 tablets (60 mg total) by mouth daily. 135 tablet 2  . gabapentin (NEURONTIN) 300 MG capsule TAKE ONE CAPSULE BY MOUTH 3 TIMES DAILY 90 capsule 5  . glipiZIDE (GLUCOTROL XL) 10 MG 24 hr tablet TAKE ONE TABLET BY MOUTH TWICE DAILY 180 tablet 1  . levothyroxine (SYNTHROID) 137 MCG tablet TAKE ONE TABLET BY MOUTH EVERY MORNING 90 tablet 1  . XARELTO 20 MG TABS tablet TAKE ONE TABLET BY MOUTH EVERY EVENING 90 tablet 1  . zolpidem (AMBIEN) 10 MG tablet Take 1 tablet (10 mg total) by mouth at bedtime as needed for sleep. 30 tablet 1   Current Facility-Administered Medications  Medication Dose Route Frequency Provider Last Rate Last Admin  . triamcinolone acetonide (KENALOG-40) injection 20 mg  20 mg Intra-articular Once Cox, Kirsten, MD        Allergies:   Propranolol, Clonidine derivatives, Hydralazine hcl, Invokana [canagliflozin], Metformin and related, and Topamax [topiramate]   Social History:  The patient  reports that he has never smoked. He has never used smokeless tobacco. He reports previous alcohol use. He reports that he  does not use drugs.   Family History:  The patient's family history includes Arthritis in an other family member; Congestive Heart Failure in an other family member; Diabetes type II in an other family member; Heart Problems in his father; Heart attack in his mother; Hyperlipidemia in an other family member; Transient ischemic attack in his mother; Tuberculosis in his brother.   ROS:  Please see the history of present illness.   Otherwise, review of systems is positive for none.   All other systems are reviewed and negative.   PHYSICAL EXAM: VS:  BP 130/72   Pulse 78   Ht 6\' 3"  (1.905 m)   Wt 234 lb 9.6 oz (106.4 kg)   SpO2 98%   BMI 29.32 kg/m  , BMI Body mass index is 29.32 kg/m. GEN: Well nourished, well developed, in no acute distress  HEENT: normal  Neck: no JVD, carotid bruits, or masses Cardiac: RRR; no murmurs, rubs, or gallops,no edema  Respiratory:  clear to auscultation bilaterally, normal work of breathing GI:  soft, nontender, nondistended, + BS MS: no deformity or atrophy  Skin: warm and dry, device site well healed Neuro:  Strength and sensation are intact Psych: euthymic mood, full affect  EKG:  EKG is not ordered today. Personal review of the ekg ordered 09/16/20 shows atrial flutter, ventricular paced  Personal review of the device interrogation today. Results in [place]: 09/22/2019: TSH 3.180 07/13/2020: ALT 12 07/29/2020: NT-Pro BNP 844 08/23/2020: BUN 25; Creatinine, Ser 1.70; Hemoglobin 15.8; Platelets 150; Potassium 4.7; Sodium 139    Lipid Panel     Component Value Date/Time   CHOL 105 06/30/2020 0807   TRIG 55 06/30/2020 0807   HDL 41 06/30/2020 0807   CHOLHDL 2.6 06/30/2020 0807   LDLCALC 51 06/30/2020 0807     Wt Readings from Last 3 Encounters:  09/20/20 234 lb 9.6 oz (106.4 kg)  09/16/20 232 lb (105.2 kg)  08/23/20 233 lb (105.7 kg)      Other studies Reviewed: Additional studies/ records that were reviewed today include: TTE  03/01/18  Review of the above records today demonstrates:  - Left ventricle: The cavity size was normal. Wall thickness was   increased in a pattern of mild LVH. Systolic function was mildly   reduced. The estimated ejection fraction was in the range of 45%   to 50%. Diffuse hypokinesis. - Left atrium: The atrium was mildly dilated. Volume/bsa, S: 37.6   ml/m^2. - Right ventricle: The cavity size was moderately dilated. Wall   thickness was normal. Pacer wire or catheter noted in right   ventricle.   ASSESSMENT AND PLAN:  1.  Persistent atrial fibrillation: Currently on Xarelto and amiodarone.  High risk medication monitoring.  Status post cryoablation on 06/20/2017 with a CHA2DS2-VASc of 4.  High risk medication monitoring.  Now status post repeat RF ablation 05/21/2020.  Unfortunately he has had more frequent episodes of what appears to be atrial flutter.  He is status post cardioversion, but unfortunately did have recurrence.  At this point he would like to hold off on ablation.  We Cecile Guevara increase his Cardizem to see if this makes him feel better.  He has a low burden of atrial tachycardia on the device, though he is symptomatic when he is in it.  2.  Hypertension: Currently well controlled  3.  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  Device functioning appropriately.  No changes at this time.    4.  Obstructive sleep apnea: CPAP compliance encouraged  5.  Morbid obesity: Weight loss with diet and exercise encouraged  6.  Hyperlipidemia: Continue Lipitor   Current medicines are reviewed at length with the patient today.   The patient does not have concerns regarding his medicines.  The following changes were made today: Increase Coreg  Labs/ tests ordered today include:  No orders of the defined types were placed in this encounter.  Disposition:   FU with Ajna Moors 3 months  Signed, Kameelah Minish [name], MD  09/20/2020 11:38 AM      [facility] [address] [place] [address] [phone] (office) [phone] (fax)

## 2020-09-20 NOTE — Patient Instructions (Addendum)
Medication Instructions:  Your physician has recommended you make the following change in your medication:  1. INCREASE Carvedilol to 12.5 mg twice daily  *If you need a refill on your cardiac medications before your next appointment, please call your pharmacy*   Lab Work: None ordered  Testing/Procedures: None ordered   Follow-Up: At [facility], you and your health needs are our priority.  As part of our continuing mission to provide you with exceptional heart care, we have created designated Provider Care Teams.  These Care Teams include your primary Cardiologist (physician) and Advanced Practice Providers (APPs -  Physician Assistants and Nurse Practitioners) who all work together to provide you with the care you need, when you need it.  Remote monitoring is used to monitor your Pacemaker or ICD from home. This monitoring reduces the number of office visits required to check your device to one time per year. It allows [place] to keep an eye on the functioning of your device to ensure it is working properly. You are scheduled for a device check from home on 12/09/2020. You may send your transmission at any time that day. If you have a wireless device, the transmission will be sent automatically. After your physician reviews your transmission, you will receive a postcard with your next transmission date.  Your next appointment:    June 27 at 9:45 am  The format for your next appointment:   In Person  Provider:   Allegra Lai, MD   Thank you for choosing [facility]!!   Trinidad Curet, RN [phone]

## 2020-09-20 NOTE — Progress Notes (Signed)
Remote pacemaker transmission.   

## 2020-09-30 ENCOUNTER — Telehealth (INDEPENDENT_AMBULATORY_CARE_PROVIDER_SITE_OTHER): Payer: Medicare Other | Admitting: Nurse Practitioner

## 2020-09-30 ENCOUNTER — Encounter: Payer: Self-pay | Admitting: Nurse Practitioner

## 2020-09-30 ENCOUNTER — Telehealth: Payer: Self-pay

## 2020-09-30 VITALS — BP 104/76 | HR 70 | Temp 97.3°F | Resp 18 | Ht 74.0 in | Wt 230.0 lb

## 2020-09-30 DIAGNOSIS — I951 Orthostatic hypotension: Secondary | ICD-10-CM | POA: Diagnosis not present

## 2020-09-30 DIAGNOSIS — I482 Chronic atrial fibrillation, unspecified: Secondary | ICD-10-CM

## 2020-09-30 DIAGNOSIS — R0600 Dyspnea, unspecified: Secondary | ICD-10-CM

## 2020-09-30 DIAGNOSIS — I5089 Other heart failure: Secondary | ICD-10-CM | POA: Diagnosis not present

## 2020-09-30 DIAGNOSIS — R0609 Other forms of dyspnea: Secondary | ICD-10-CM

## 2020-09-30 DIAGNOSIS — K5909 Other constipation: Secondary | ICD-10-CM | POA: Diagnosis not present

## 2020-09-30 LAB — COMPREHENSIVE METABOLIC PANEL
ALT: 25 IU/L (ref 0–44)
AST: 20 IU/L (ref 0–40)
Albumin/Globulin Ratio: 1.7 (ref 1.2–2.2)
Albumin: 4.2 g/dL (ref 3.6–4.6)
Alkaline Phosphatase: 117 IU/L (ref 44–121)
BUN/Creatinine Ratio: 18 (ref 10–24)
BUN: 33 mg/dL — ABNORMAL HIGH (ref 8–27)
Bilirubin Total: 0.7 mg/dL (ref 0.0–1.2)
CO2: 22 mmol/L (ref 20–29)
Calcium: 9.1 mg/dL (ref 8.6–10.2)
Chloride: 103 mmol/L (ref 96–106)
Creatinine, Ser: 1.85 mg/dL — ABNORMAL HIGH (ref 0.76–1.27)
Globulin, Total: 2.5 g/dL (ref 1.5–4.5)
Glucose: 341 mg/dL — ABNORMAL HIGH (ref 65–99)
Potassium: 5.7 mmol/L — ABNORMAL HIGH (ref 3.5–5.2)
Sodium: 137 mmol/L (ref 134–144)
Total Protein: 6.7 g/dL (ref 6.0–8.5)
eGFR: 36 mL/min/{1.73_m2} — ABNORMAL LOW (ref >59)

## 2020-09-30 LAB — CBC WITH DIFFERENTIAL/PLATELET
Basophils Absolute: 0.1 10*3/uL (ref 0.0–0.2)
Basos: 2 %
EOS (ABSOLUTE): 0.1 10*3/uL (ref 0.0–0.4)
Eos: 3 %
Hematocrit: 49.6 % (ref 37.5–51.0)
Hemoglobin: 16.5 g/dL (ref 13.0–17.7)
Immature Grans (Abs): 0 10*3/uL (ref 0.0–0.1)
Immature Granulocytes: 0 %
Lymphocytes Absolute: 1 10*3/uL (ref 0.7–3.1)
Lymphs: 22 %
MCH: 32.4 pg (ref 26.6–33.0)
MCHC: 33.3 g/dL (ref 31.5–35.7)
MCV: 97 fL (ref 79–97)
Monocytes Absolute: 0.4 10*3/uL (ref 0.1–0.9)
Monocytes: 9 %
Neutrophils Absolute: 2.9 10*3/uL (ref 1.4–7.0)
Neutrophils: 64 %
Platelets: 143 10*3/uL — ABNORMAL LOW (ref 150–450)
RBC: 5.1 x10E6/uL (ref 4.14–5.80)
RDW: 12.9 % (ref 11.6–15.4)
WBC: 4.6 10*3/uL (ref 3.4–10.8)

## 2020-09-30 NOTE — Progress Notes (Signed)
Acute Office Visit  Subjective:    Patient ID: Paul Kent, male    DOB: [birth year], 80 y.o.   MRN: [id number]  Chief Complaint  Patient presents with  . Abdominal Pain  . Shortness of Breath    HPI Patient is in today for mid-abdominal pain and dyspnea.He is accompanied by his wife that helps supplement history. Vrishank was hospitalized approximately 3-weeks ago with HF exacerbation. He is followed by Dr Agustin Cree, cardiology. He was last seen in cardiology office on 09/20/20 by Dr Curt Bears. Carvedilol was increased to 12.5 mg BID. Jamare has experienced brief episodes of dizziness and lightheadedness when changing positions. Denies falling or syncope.He also c/o generalized abdominal pain. He states he is experiencing less frequent bowel movement but has had "some small ones". States he takes Miralax daily. Denies decreased appetite.   Past Medical History:  Diagnosis Date  . Acquired bilateral hammer toes   . Arthralgia of left temporomandibular joint   . Atrial fibrillation ([place]) 05/2016  . Bladder cancer ([place])   . Bradycardia    LOW HEART RATE  . Calculus of ureter   . 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 Cellulitis of right lower limb   . CHF (congestive heart failure) ([place]) 02/2018  . Chronic atrial fibrillation ([place])   . Corns and callosities   . Deviated septum 07/01/2018  . Diabetes mellitus due to underlying condition with unspecified complications ([place]) 58/02/9832  . Disturbances of salivary secretion   . Epistaxis   . Essential hypertension   . Essential tremor   . H pylori ulcer   . Herpes zoster with nervous system complication 82/50/5397  . Hesitancy of micturition   . Hyperlipidemia   . Hypothyroidism   . Impacted cerumen, bilateral   . Jaw pain   . Lesion of femoral nerve 04/30/2013  . Localized edema   . Male erectile disorder   . Malignant neoplasm of overlapping sites of bladder ([place])   . Nasal congestion 07/01/2018  . Nasal turbinate  hypertrophy 07/01/2018  . Obstructive sleep apnea 01/17/2017  . Other constipation   . Other fatigue   . Overweight   . Pacemaker reprogramming/check 02/12/2017  . Pain in right finger(s)   . Pain in right lower leg   . Paroxysmal atrial fibrillation ([place]) 06/08/2016  . Peptic ulcer disease   . Persistent atrial fibrillation ([place]) 06/01/2017  . Primary insomnia   . Renal stones   . Renal stones   . Secondary hypercoagulable state ([place]) 06/17/2020  . 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 Shingles 06/2012  . Shortness of breath   . Sick sinus syndrome ([place])   . Sinus bradycardia 03/07/2017  . Sleep apnea    dx 08/2015, CPAP  . Spontaneous ecchymoses   . Squamous cell carcinoma of skin    UNSPECIFIED  . Testicular hypofunction   . Thoracic or lumbosacral neuritis or radiculitis 06/03/2013  . Trigger middle finger of right hand 11/28/2019  . Type 2 diabetes mellitus ([place]) 1994   UNCONTROLLED, COMPLICATIONS INCLUDE NEPHROPATHY AND PERIPHERAL NEUROPATHY  . Type 2 diabetes mellitus with circulatory disorder, 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[place]) 01/17/2017    Past Surgical History:  Procedure Laterality Date  . ABLATION  06/2017  . ATRIAL FIBRILLATION ABLATION N/A 05/21/2020   Procedure: ATRIAL FIBRILLATION ABLATION;  Surgeon: Constance Haw, MD;  Location: [facility];  Service: Cardiovascular;  Laterality: N/A;  .  CARDIOVERSION N/A 03/22/2018   Procedure: CARDIOVERSION;  Surgeon: Buford Dresser, MD;  Location: [facility] ENDOSCOPY;  Service: Cardiovascular;  Laterality: N/A;  . CARDIOVERSION N/A 07/08/2020   Procedure: CARDIOVERSION;  Surgeon: Jerline Pain, MD;  Location: [facility] ENDOSCOPY;  Service: Cardiovascular;  Laterality: N/A;  . HEMIARTHROPLASTY HIP  10/31/2013   IT HIP BIPOLAR  . LUMBAR SPINE SURGERY    . PACEMAKER PLACEMENT  01/29/2017  . SHOULDER SURGERY Right     Family History  Problem  Relation Age of Onset  . Heart attack Mother   . Transient ischemic attack Mother   . Heart Problems Father   . Tuberculosis Brother   . Diabetes type II Other   . Hyperlipidemia Other   . Congestive Heart Failure Other   . Arthritis Other     Social History   Socioeconomic History  . Marital status: Married    Spouse name: [name]  . Number of children: 3  . Years of education: Not on file  . Highest education level: Not on file  Occupational History  . Occupation: RETIRED    Comment: school principal  Tobacco Use  . Smoking status: Never Smoker  . Smokeless tobacco: Never Used  Vaping Use  . Vaping Use: Never used  Substance and Sexual Activity  . Alcohol use: Not Currently  . Drug use: Never  . Sexual activity: Not on file  Other Topics Concern  . Not on file  Social History Narrative   Lives with wife   Social Determinants of Health   Financial Resource Strain: Not on file  Food Insecurity: Not on file  Transportation Needs: Not on file  Physical Activity: Not on file  Stress: Not on file  Social Connections: Not on file  Intimate Partner Violence: Not on file    Outpatient Medications Prior to Visit  Medication Sig Dispense Refill  . amiodarone (PACERONE) 200 MG tablet TAKE ONE TABLET BY MOUTH EVERY DAY 90 tablet 0  . atorvastatin (LIPITOR) 10 MG tablet TAKE ONE TABLET BY MOUTH EVERY EVENING 90 tablet 2  . carvedilol (COREG) 12.5 MG tablet Take 1 tablet (12.5 mg total) by mouth 2 (two) times daily. 180 tablet 1  . furosemide (LASIX) 40 MG tablet Take 1.5 tablets (60 mg total) by mouth daily. (Patient taking differently: Take 60 mg by mouth daily as needed.) 135 tablet 2  . gabapentin (NEURONTIN) 300 MG capsule TAKE ONE CAPSULE BY MOUTH 3 TIMES DAILY 90 capsule 5  . glipiZIDE (GLUCOTROL XL) 10 MG 24 hr tablet TAKE ONE TABLET BY MOUTH TWICE DAILY 180 tablet 1  . levothyroxine (SYNTHROID) 137 MCG tablet TAKE ONE TABLET BY MOUTH EVERY MORNING 90 tablet 1  .  XARELTO 20 MG TABS tablet TAKE ONE TABLET BY MOUTH EVERY EVENING 90 tablet 1  . zolpidem (AMBIEN) 10 MG tablet Take 1 tablet (10 mg total) by mouth at bedtime as needed for sleep. 30 tablet 1   Facility-Administered Medications Prior to Visit  Medication Dose Route Frequency Provider Last Rate Last Admin  . triamcinolone acetonide (KENALOG-40) injection 20 mg  20 mg Intra-articular Once [name], MD        Allergies  Allergen Reactions  . Propranolol     Drops HR too low   . Clonidine Derivatives Nausea And Vomiting  . Hydralazine Hcl     Patient had an adverse reaction   . Invokana [Canagliflozin]     Patient had an adverse reaction  . Metformin And Related Diarrhea  .  Topamax [Topiramate]     Patient had an adverse reaction    Review of Systems  Constitutional: Positive for fatigue.  HENT: Negative.   Eyes: Negative.   Respiratory: Positive for shortness of breath (with activity). Negative for cough and chest tightness.   Cardiovascular: Negative for chest pain and palpitations.  Gastrointestinal: Positive for abdominal pain (generalized) and constipation. Negative for blood in stool, diarrhea, nausea and vomiting.  Endocrine: Negative.   Genitourinary: Negative.   Musculoskeletal: Negative.   Skin: Negative.   Neurological: Positive for dizziness, weakness and light-headedness.  Psychiatric/Behavioral: Negative.        Objective:    Physical Exam Vitals reviewed.  Constitutional:      Appearance: He is well-developed.  HENT:     Head: Normocephalic.     Mouth/Throat:     Comments: Oral mucosa dry  Cardiovascular:     Rate and Rhythm: Normal rate. Rhythm irregular.  Pulmonary:     Effort: Pulmonary effort is normal.     Breath sounds: Normal breath sounds.  Abdominal:     Palpations: Abdomen is soft. There is no hepatomegaly, mass or pulsatile mass.     Tenderness: There is generalized abdominal tenderness.  Skin:    General: Skin is warm and dry.      Capillary Refill: Capillary refill takes less than 2 seconds.  Neurological:     General: No focal deficit present.     Mental Status: He is alert and oriented to person, place, and time.  Psychiatric:        Mood and Affect: Mood normal.        Behavior: Behavior normal.     BP 120/70   Pulse 74   Temp (!) 97.3 F (36.3 C)   Resp 18   Ht 6\' 2"  (1.88 m)   Wt 230 lb (104.3 kg)   BMI 29.53 kg/m  Wt Readings from Last 3 Encounters:  09/30/20 230 lb (104.3 kg)  09/20/20 234 lb 9.6 oz (106.4 kg)  09/16/20 232 lb (105.2 kg)    Health Maintenance Due  Topic Date Due  . FOOT EXAM  Never done  . OPHTHALMOLOGY EXAM  Never done  . URINE MICROALBUMIN  Never done  . TETANUS/TDAP  Never done  . PNA vac Low Risk Adult (2 of 2 - PCV13) 06/20/2015     Lab Results  Component Value Date   TSH 3.180 09/22/2019   Lab Results  Component Value Date   WBC 4.9 08/23/2020   HGB 15.8 08/23/2020   HCT 47.2 08/23/2020   MCV 97 08/23/2020   PLT 150 08/23/2020   Lab Results  Component Value Date   NA 139 08/23/2020   K 4.7 08/23/2020   CO2 24 08/23/2020   GLUCOSE 248 (H) 08/23/2020   BUN 25 08/23/2020   CREATININE 1.70 (H) 08/23/2020   BILITOT 1.1 07/13/2020   ALKPHOS 103 07/13/2020   AST 18 07/13/2020   ALT 12 07/13/2020   PROT 6.7 07/13/2020   ALBUMIN 4.1 07/13/2020   CALCIUM 8.6 08/23/2020   Lab Results  Component Value Date   CHOL 105 06/30/2020   Lab Results  Component Value Date   HDL 41 06/30/2020   Lab Results  Component Value Date   LDLCALC 51 06/30/2020   Lab Results  Component Value Date   TRIG 55 06/30/2020   Lab Results  Component Value Date   CHOLHDL 2.6 06/30/2020   Lab Results  Component Value Date   HGBA1C  7.6 (H) 06/30/2020       Assessment & Plan:    1. Chronic atrial fibrillation (HCC) - CBC with Differential/Platelet - Comprehensive metabolic panel -continue Coreg 12.5 mg BID -continue f/u with cardiology as scheduled  2. Other  heart failure (HCC) - Pro b natriuretic peptide  3. Dyspnea on exertion - EKG 12-Lead -Walk with periods of resting  4. Orthostatic hypotension -Increase fluid intake -Change positions slowly -Fall precautions, use cane for ambulation  5. Other constipation Dulcolax laxative tablets as directed    Increase fluid intake Change positions slowly Take laxative for constipation Follow up as needed  Follow-up: As needed  Signed, Rip Harbour, NP

## 2020-09-30 NOTE — Patient Instructions (Signed)
Increase fluid intake Change positions slowly Take laxative for constipation Follow up as needed  Orthostatic Hypotension Blood pressure is a measurement of how strongly, or weakly, your blood is pressing against the walls of your arteries. Orthostatic hypotension is a sudden drop in blood pressure that happens when you quickly change positions, such as when you get up from sitting or lying down. Arteries are blood vessels that carry blood from your heart throughout your body. When blood pressure is too low, you may not get enough blood to your brain or to the rest of your organs. This can cause weakness, light-headedness, rapid heartbeat, and fainting. This can last for just a few seconds or for up to a few minutes. Orthostatic hypotension is usually not a serious problem. However, if it happens frequently or gets worse, it may be a sign of something more serious. What are the causes? This condition may be caused by:  Sudden changes in posture, such as standing up quickly after you have been sitting or lying down.  Blood loss.  Loss of body fluids (dehydration).  Heart problems.  Hormone (endocrine) problems.  Pregnancy.  Severe infection.  Lack of certain nutrients.  Severe allergic reactions (anaphylaxis).  Certain medicines, such as blood pressure medicine or medicines that make the body lose excess fluids (diuretics). Sometimes, this condition can be caused by not taking medicine as directed, such as taking too much of a certain medicine. What increases the risk? The following factors may make you more likely to develop this condition:  Age. Risk increases as you get older.  Conditions that affect the heart or the central nervous system.  Taking certain medicines, such as blood pressure medicine or diuretics.  Being pregnant. What are the signs or symptoms? Symptoms of this condition may include:  Weakness.  Light-headedness.  Dizziness.  Blurred  vision.  Fatigue.  Rapid heartbeat.  Fainting, in severe cases. How is this diagnosed? This condition is diagnosed based on:  Your medical history.  Your symptoms.  Your blood pressure measurement. Your health care provider will check your blood pressure when you are: ? Lying down. ? Sitting. ? Standing. A blood pressure reading is recorded as two numbers, such as "120 over 80" (or 120/80). The first ("top") number is called the systolic pressure. It is a measure of the pressure in your arteries as your heart beats. The second ("bottom") number is called the diastolic pressure. It is a measure of the pressure in your arteries when your heart relaxes between beats. Blood pressure is measured in a unit called mm Hg. Healthy blood pressure for most adults is 120/80. If your blood pressure is below 90/60, you may be diagnosed with hypotension. Other information or tests that may be used to diagnose orthostatic hypotension include:  Your other vital signs, such as your heart rate and temperature.  Blood tests.  Tilt table test. For this test, you will be safely secured to a table that moves you from a lying position to an upright position. Your heart rhythm and blood pressure will be monitored during the test. How is this treated? This condition may be treated by:  Changing your diet. This may involve eating more salt (sodium) or drinking more water.  Taking medicines to raise your blood pressure.  Changing the dosage of certain medicines you are taking that might be lowering your blood pressure.  Wearing compression stockings. These stockings help to prevent blood clots and reduce swelling in your legs. In some cases, you  may need to go to the hospital for:  Fluid replacement. This means you will receive fluids through an IV.  Blood replacement. This means you will receive donated blood through an IV (transfusion).  Treating an infection or heart problems, if this  applies.  Monitoring. You may need to be monitored while medicines that you are taking wear off. Follow these instructions at home: Eating and drinking  Drink enough fluid to keep your urine pale yellow.  Eat a healthy diet, and follow instructions from your health care provider about eating or drinking restrictions. A healthy diet includes: ? Fresh fruits and vegetables. ? Whole grains. ? Lean meats. ? Low-fat dairy products.  Eat extra salt only as directed. Do not add extra salt to your diet unless your health care provider told you to do that.  Eat frequent, small meals.  Avoid standing up suddenly after eating.   Medicines  Take over-the-counter and prescription medicines only as told by your health care provider. ? Follow instructions from your health care provider about changing the dosage of your current medicines, if this applies. ? Do not stop or adjust any of your medicines on your own. General instructions  Wear compression stockings as told by your health care provider.  Get up slowly from lying down or sitting positions. This gives your blood pressure a chance to adjust.  Avoid hot showers and excessive heat as directed by your health care provider.  Return to your normal activities as told by your health care provider. Ask your health care provider what activities are safe for you.  Do not use any products that contain nicotine or tobacco, such as cigarettes, e-cigarettes, and chewing tobacco. If you need help quitting, ask your health care provider.  Keep all follow-up visits as told by your health care provider. This is important.   Contact a health care provider if you:  Vomit.  Have diarrhea.  Have a fever for more than 2-3 days.  Feel more thirsty than usual.  Feel weak and tired. Get help right away if you:  Have chest pain.  Have a fast or irregular heartbeat.  Develop numbness in any part of your body.  Cannot move your arms or your  legs.  Have trouble speaking.  Become sweaty or feel light-headed.  Faint.  Feel short of breath.  Have trouble staying awake.  Feel confused. Summary  Orthostatic hypotension is a sudden drop in blood pressure that happens when you quickly change positions.  Orthostatic hypotension is usually not a serious problem.  It is diagnosed by having your blood pressure taken lying down, sitting, and then standing.  It may be treated by changing your diet or adjusting your medicines. This information is not intended to replace advice given to you by your health care provider. Make sure you discuss any questions you have with your health care provider. Document Revised: 11/29/2017 Document Reviewed: 11/29/2017 Elsevier Patient Education  [place].

## 2020-09-30 NOTE — Telephone Encounter (Signed)
Pt calling states he just does not feel himself. Complains of congestion, chest tightness, nausea, constipation, SOB, fatigue, dizzy when first standing.  Denies fever, diarrhea, body aches, palpitations.  Pt states he was hospitalized 4 weeks ago for pulmonary edema. For constipation pt has taken miralax, omeprazole, and gasX. BP this morning was 110/70 with pulse 75. Pt states he has had covid shots and flu shot. He denies sick exposure.   Pt scheduled for phone visit w/ NP.   Royce Macadamia, [place] 09/30/20 12:01 PM

## 2020-10-01 ENCOUNTER — Telehealth: Payer: Self-pay | Admitting: Cardiology

## 2020-10-01 DIAGNOSIS — Z79899 Other long term (current) drug therapy: Secondary | ICD-10-CM

## 2020-10-01 LAB — PRO B NATRIURETIC PEPTIDE: NT-Pro BNP: 1483 pg/mL — ABNORMAL HIGH (ref 0–486)

## 2020-10-01 NOTE — Telephone Encounter (Signed)
Paul Kent called to say that the patient had abnormal labs for his visit with his PC Dr. please review to determine if he needs to be seen or change medicine.

## 2020-10-01 NOTE — Telephone Encounter (Signed)
Called patient to inform him that Dr. Curt Bears will not be following his lab work.  Dr. Agustin Cree his general cardiologist office will review.  He is agreeable to this plan.

## 2020-10-04 NOTE — Telephone Encounter (Signed)
Called patient. His medication list in our chart is correct besides he no longer is taking any lasix or potassium. Will informed Dr. Fraser Din. He is taking carvedilol and amiodarone.

## 2020-10-05 NOTE — Telephone Encounter (Signed)
Left message for patient to return call.

## 2020-10-06 ENCOUNTER — Telehealth: Payer: Self-pay | Admitting: Cardiology

## 2020-10-06 DIAGNOSIS — R7989 Other specified abnormal findings of blood chemistry: Secondary | ICD-10-CM | POA: Diagnosis not present

## 2020-10-06 LAB — BASIC METABOLIC PANEL
BUN/Creatinine Ratio: 18 (ref 10–24)
BUN: 30 mg/dL — ABNORMAL HIGH (ref 8–27)
CO2: 21 mmol/L (ref 20–29)
Calcium: 8.5 mg/dL — ABNORMAL LOW (ref 8.6–10.2)
Chloride: 103 mmol/L (ref 96–106)
Creatinine, Ser: 1.69 mg/dL — ABNORMAL HIGH (ref 0.76–1.27)
Glucose: 257 mg/dL — ABNORMAL HIGH (ref 65–99)
Potassium: 5.1 mmol/L (ref 3.5–5.2)
Sodium: 136 mmol/L (ref 134–144)
eGFR: 40 mL/min/{1.73_m2} — ABNORMAL LOW (ref >59)

## 2020-10-06 NOTE — Telephone Encounter (Signed)
See additional call

## 2020-10-06 NOTE — Telephone Encounter (Signed)
Pt is returning call to [facility] from 10/05/20

## 2020-10-06 NOTE — Telephone Encounter (Signed)
Called patient. Informed him to have labs drawn per Dr. Agustin Cree he understood no further questions.

## 2020-10-07 ENCOUNTER — Other Ambulatory Visit: Payer: Self-pay | Admitting: Legal Medicine

## 2020-10-07 ENCOUNTER — Other Ambulatory Visit: Payer: Self-pay | Admitting: Family Medicine

## 2020-10-09 ENCOUNTER — Other Ambulatory Visit: Payer: Self-pay | Admitting: Family Medicine

## 2020-10-12 ENCOUNTER — Other Ambulatory Visit: Payer: Self-pay

## 2020-10-12 ENCOUNTER — Ambulatory Visit (INDEPENDENT_AMBULATORY_CARE_PROVIDER_SITE_OTHER): Payer: Medicare Other

## 2020-10-12 ENCOUNTER — Ambulatory Visit (INDEPENDENT_AMBULATORY_CARE_PROVIDER_SITE_OTHER): Payer: Medicare Other | Admitting: Family Medicine

## 2020-10-12 VITALS — BP 130/70 | HR 84 | Temp 97.9°F | Resp 18 | Ht 74.0 in | Wt 239.0 lb

## 2020-10-12 DIAGNOSIS — E039 Hypothyroidism, unspecified: Secondary | ICD-10-CM

## 2020-10-12 DIAGNOSIS — I5032 Chronic diastolic (congestive) heart failure: Secondary | ICD-10-CM | POA: Diagnosis not present

## 2020-10-12 DIAGNOSIS — I5089 Other heart failure: Secondary | ICD-10-CM | POA: Diagnosis not present

## 2020-10-12 DIAGNOSIS — E782 Mixed hyperlipidemia: Secondary | ICD-10-CM | POA: Diagnosis not present

## 2020-10-12 DIAGNOSIS — G250 Essential tremor: Secondary | ICD-10-CM

## 2020-10-12 DIAGNOSIS — R29898 Other symptoms and signs involving the musculoskeletal system: Secondary | ICD-10-CM

## 2020-10-12 DIAGNOSIS — Z950 Presence of cardiac pacemaker: Secondary | ICD-10-CM | POA: Diagnosis not present

## 2020-10-12 DIAGNOSIS — R202 Paresthesia of skin: Secondary | ICD-10-CM | POA: Diagnosis not present

## 2020-10-12 DIAGNOSIS — Z23 Encounter for immunization: Secondary | ICD-10-CM | POA: Diagnosis not present

## 2020-10-12 DIAGNOSIS — L97411 Non-pressure chronic ulcer of right heel and midfoot limited to breakdown of skin: Secondary | ICD-10-CM

## 2020-10-12 DIAGNOSIS — N1832 Chronic kidney disease, stage 3b: Secondary | ICD-10-CM

## 2020-10-12 DIAGNOSIS — I482 Chronic atrial fibrillation, unspecified: Secondary | ICD-10-CM | POA: Diagnosis not present

## 2020-10-12 DIAGNOSIS — R270 Ataxia, unspecified: Secondary | ICD-10-CM

## 2020-10-12 DIAGNOSIS — E08621 Diabetes mellitus due to underlying condition with foot ulcer: Secondary | ICD-10-CM | POA: Diagnosis not present

## 2020-10-12 DIAGNOSIS — K5904 Chronic idiopathic constipation: Secondary | ICD-10-CM

## 2020-10-12 MED ORDER — SILVER SULFADIAZINE 1 % EX CREA: 1.0000 "application " | TOPICAL_CREAM | Freq: Every day | CUTANEOUS | 0 refills | Status: DC

## 2020-10-12 MED ORDER — DAPAGLIFLOZIN PROPANEDIOL 10 MG PO TABS: 10.0000 mg | ORAL_TABLET | Freq: Every day | ORAL | 2 refills | Status: DC

## 2020-10-12 NOTE — Progress Notes (Signed)
   Covid-19 Vaccination Clinic  Name:  Paul Kent    MRN: [id number] DOB: 1941-02-03  10/12/2020  Paul Kent was observed post Covid-19 immunization for 15 minutes without incident. He was provided with Vaccine Information Sheet and instruction to access the V-Safe system.   Paul Kent was instructed to call 911 with any severe reactions post vaccine: Marland Kitchen Difficulty breathing  . Swelling of face and throat  . A fast heartbeat  . A bad rash all over body  . Dizziness and weakness

## 2020-10-12 NOTE — Patient Instructions (Addendum)
Constipation:  Trial on metamucil combined with miralax twice daily.   Diabetes: Start farxiga 10 mg once daily.  Continue glipizide.   Refer to nephrology due to chronic kidney disease.   Silverdene apply once daily to heel.   Refer to physical therapy for leg weakness.   Referral to our pharmacist, Sherre Poot, PHD.

## 2020-10-12 NOTE — Progress Notes (Signed)
Subjective:  Patient ID: Paul Kent, male    DOB: 1940-08-14  Age: 80 y.o. MRN: [id number]  Chief Complaint  Patient presents with  . Diabetes    HPI Diabetes: fell off diet. Checking sugars once daily in am. Last 2 weeks sugars 140s. Sugars running 200-300s. Legs are weak and unsteady. Still going to 24 hour gym. Rides bike. Use several machines. Complaining of difficulty walking due to balance and due to pain. Currently only on glipizide.  Hypertension/atrial fibrillation: Currently on amiodarone, carvedilol, furosemide, and Xarelto. Hyperlipidemia: Currently on atorvastatin 10 mg once daily Hypothyroidism currently on levothyroxine 137 mcg once daily in a.m. Insomnia: Sleeps well with Ambien. Right heel ulcer: started due to use of AFO. ABIS done in 06/2020 normal. Wife using antibiotic ointment on it. Previously had silver sulfadene which worked better.  Left great toenail removed at his last visit with me and it has healed. .  CHF: hospitalized for pulmonary edema. Doing better.   Current Outpatient Medications on File Prior to Visit  Medication Sig Dispense Refill  . amiodarone (PACERONE) 200 MG tablet TAKE ONE TABLET BY MOUTH EVERY DAY 90 tablet 0  . atorvastatin (LIPITOR) 10 MG tablet TAKE ONE TABLET BY MOUTH EVERY EVENING 90 tablet 2  . carvedilol (COREG) 12.5 MG tablet Take 1 tablet (12.5 mg total) by mouth 2 (two) times daily. 180 tablet 1  . furosemide (LASIX) 40 MG tablet Take 1.5 tablets (60 mg total) by mouth daily. (Patient taking differently: Take 60 mg by mouth daily as needed.) 135 tablet 2  . gabapentin (NEURONTIN) 300 MG capsule TAKE ONE CAPSULE BY MOUTH 3 TIMES DAILY 90 capsule 5  . glipiZIDE (GLUCOTROL XL) 10 MG 24 hr tablet TAKE ONE TABLET BY MOUTH TWICE DAILY 180 tablet 1  . levothyroxine (SYNTHROID) 137 MCG tablet TAKE ONE TABLET BY MOUTH EVERY MORNING 90 tablet 1  . XARELTO 20 MG TABS tablet TAKE ONE TABLET BY MOUTH EVERY EVENING 90 tablet 1  . zolpidem  (AMBIEN) 10 MG tablet Take 1 tablet (10 mg total) by mouth at bedtime as needed for sleep. 30 tablet 5   Current Facility-Administered Medications on File Prior to Visit  Medication Dose Route Frequency Provider Last Rate Last Admin  . triamcinolone acetonide (KENALOG-40) injection 20 mg  20 mg Intra-articular Once [name], MD       Past Medical History:  Diagnosis Date  . Acquired bilateral hammer toes   . Arthralgia of left temporomandibular joint   . Atrial fibrillation ([place]) 05/2016  . Bladder cancer ([place])   . Bradycardia    LOW HEART RATE  . Calculus of ureter   . 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 Cellulitis of right lower limb   . CHF (congestive heart failure) ([place]) 02/2018  . Chronic atrial fibrillation ([place])   . Corns and callosities   . Deviated septum 07/01/2018  . Diabetes mellitus due to underlying condition with unspecified complications ([place]) [id number]  . Disturbances of salivary secretion   . Epistaxis   . Essential hypertension   . Essential tremor   . H pylori ulcer   . Herpes zoster with nervous system complication [id number]  . Hesitancy of micturition   . Hyperlipidemia   . Hypothyroidism   . Impacted cerumen, bilateral   . Jaw pain   . Lesion of femoral nerve 04/30/2013  . Localized edema   . Male erectile disorder   . Malignant neoplasm of overlapping sites of bladder ([place])   .  Nasal congestion 07/01/2018  . Nasal turbinate hypertrophy 07/01/2018  . Obstructive sleep apnea 01/17/2017  . Other constipation   . Other fatigue   . Overweight   . Pacemaker reprogramming/check 02/12/2017  . Pain in right finger(s)   . Pain in right lower leg   . Paroxysmal atrial fibrillation ([place]) 06/08/2016  . Peptic ulcer disease   . Persistent atrial fibrillation ([place]) 06/01/2017  . Primary insomnia   . Renal stones   . Renal stones   . Secondary hypercoagulable state ([place]) 06/17/2020  . 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 Shingles 06/2012  . Shortness of breath   . Sick sinus syndrome ([place])   . Sinus bradycardia 03/07/2017  . Sleep apnea    dx 08/2015, CPAP  . Spontaneous ecchymoses   . Squamous cell carcinoma of skin    UNSPECIFIED  . Testicular hypofunction   . Thoracic or lumbosacral neuritis or radiculitis 06/03/2013  . Trigger middle finger of right hand 11/28/2019  . Type 2 diabetes mellitus ([place]) 1994   UNCONTROLLED, COMPLICATIONS INCLUDE NEPHROPATHY AND PERIPHERAL NEUROPATHY  . Type 2 diabetes mellitus with circulatory disorder, 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[place]) 01/17/2017   Past Surgical History:  Procedure Laterality Date  . ABLATION  06/2017  . ATRIAL FIBRILLATION ABLATION N/A 05/21/2020   Procedure: ATRIAL FIBRILLATION ABLATION;  Surgeon: Constance Haw, MD;  Location: [place] CV LAB;  Service: Cardiovascular;  Laterality: N/A;  . CARDIOVERSION N/A 03/22/2018   Procedure: CARDIOVERSION;  Surgeon: Buford Dresser, MD;  Location: [facility] ENDOSCOPY;  Service: Cardiovascular;  Laterality: N/A;  . CARDIOVERSION N/A 07/08/2020   Procedure: CARDIOVERSION;  Surgeon: Jerline Pain, MD;  Location: [facility] ENDOSCOPY;  Service: Cardiovascular;  Laterality: N/A;  . HEMIARTHROPLASTY HIP  10/31/2013   IT HIP BIPOLAR  . LUMBAR SPINE SURGERY    . PACEMAKER PLACEMENT  01/29/2017  . SHOULDER SURGERY Right     Family History  Problem Relation Age of Onset  . Heart attack Mother   . Transient ischemic attack Mother   . Heart Problems Father   . Tuberculosis Brother   . Diabetes type II Other   . Hyperlipidemia Other   . Congestive Heart Failure Other   . Arthritis Other    Social History   Socioeconomic History  . Marital status: Married    Spouse name: [name]  . Number of children: 3  . Years of education: Not on file  . Highest education level: Not on file  Occupational History  . Occupation: RETIRED    Comment: school principal   Tobacco Use  . Smoking status: Never Smoker  . Smokeless tobacco: Never Used  Vaping Use  . Vaping Use: Never used  Substance and Sexual Activity  . Alcohol use: Not Currently  . Drug use: Never  . Sexual activity: Not on file  Other Topics Concern  . Not on file  Social History Narrative   Lives with wife   Social Determinants of Health   Financial Resource Strain: Not on file  Food Insecurity: Not on file  Transportation Needs: Not on file  Physical Activity: Not on file  Stress: Not on file  Social Connections: Not on file    Review of Systems  Constitutional: Negative for chills, fatigue and fever.  HENT: Negative for congestion, ear pain and sore throat.   Respiratory: Negative for cough and shortness of breath.   Cardiovascular: Negative for chest pain.  Gastrointestinal: Positive for abdominal pain and constipation (otc meds have helped. Has BMs every other day.  Hard. Tried miralax which doesn't help.). Negative for diarrhea, nausea and vomiting.  Endocrine: Positive for polyuria. Negative for polydipsia and polyphagia.  Genitourinary: Negative for dysuria and frequency.  Musculoskeletal: Negative for arthralgias and myalgias.  Skin: Positive for wound (Posterior rt heel).  Neurological: Negative for dizziness and headaches.  Psychiatric/Behavioral: Negative for dysphoric mood.       No dysphoria     Objective:  BP 130/70   Pulse 84   Temp 97.9 F (36.6 C)   Resp 18   Ht 6\' 2"  (1.88 m)   Wt 239 lb (108.4 kg)   BMI 30.69 kg/m   BP/Weight 10/12/2020 06/24/2692 01/21/4626  Systolic BP 035 009 381  Diastolic BP 70 76 72  Wt. (Lbs) 239 230 234.6  BMI 30.69 29.53 29.32    Physical Exam Vitals reviewed.  Constitutional:      Appearance: Normal appearance. He is obese.  Neck:     Vascular: No carotid bruit.  Cardiovascular:     Rate and Rhythm: Normal rate and regular rhythm.     Pulses: Normal pulses.     Heart sounds: Normal heart sounds.   Pulmonary:     Effort: Pulmonary effort is normal.     Breath sounds: Normal breath sounds. No wheezing, rhonchi or rales.  Abdominal:     General: Bowel sounds are normal.     Palpations: Abdomen is soft.     Tenderness: There is no abdominal tenderness.  Skin:    Findings: Lesion (rt heel posteriorly wound healed over with skin, but tender to palpation. ) present.  Neurological:     Mental Status: He is alert.     Motor: Weakness (BL LEGS. Left > right. Foot drop BL. ) present.     Coordination: Coordination abnormal (uses cane. Has to pick up knees high to prevent falling from drop feet.).     Gait: Gait abnormal.  Psychiatric:        Mood and Affect: Mood normal.        Behavior: Behavior normal.     Diabetic Foot Exam - Simple   Simple Foot Form Diabetic Foot exam was performed with the following findings: Yes 10/12/2020 10:18 PM  Visual Inspection See comments: Yes Sensation Testing See comments: Yes Pulse Check Posterior Tibialis and Dorsalis pulse intact bilaterally: Yes Comments Ulcer posterior rt heel as previously described.  Decreased sensation on BL feet.       Lab Results  Component Value Date   WBC 4.6 09/30/2020   HGB 16.5 09/30/2020   HCT 49.6 09/30/2020   PLT 143 (L) 09/30/2020   GLUCOSE 257 (H) 10/06/2020   CHOL 105 06/30/2020   TRIG 55 06/30/2020   HDL 41 06/30/2020   LDLCALC 51 06/30/2020   ALT 25 09/30/2020   AST 20 09/30/2020   NA 136 10/06/2020   K 5.1 10/06/2020   CL 103 10/06/2020   CREATININE 1.69 (H) 10/06/2020   BUN 30 (H) 10/06/2020   CO2 21 10/06/2020   TSH 3.180 09/22/2019   HGBA1C 7.6 (H) 06/30/2020      Assessment & Plan:  1. Chronic atrial fibrillation (HCC) - CBC with Differential/Platelet - Comprehensive metabolic panel - AMB Referral to [place]  2. Other heart failure ([place]) - AMB Referral to [place]  3. 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 Recommend check feet daily. Recommend annual eye exams. Medicines: Start farxiga 10 mg once daily.  Continue glipizide.  Continue to work on eating a healthy diet and exercise.  Labs drawn today.   - dapagliflozin propanediol (FARXIGA) 10 MG TABS tablet; Take 1 tablet (10 mg total) by mouth daily before breakfast.  Dispense: 30 tablet; Refill: 2 - Hemoglobin A1c - AMB Referral to [place]  4. Chronic diastolic congestive heart failure ([place]) Start farxiga  5. Essential tremor Worsened.   6. Acquired hypothyroidism - TSH  7. Paresthesia - B12 and Folate Panel - Methylmalonic acid, serum  8. Mixed hyperlipidemia The current medical regimen is effective;  continue present plan and medications. Low fat diet and exercise.  - Lipid panel  9. Cardiac pacemaker in situ  10. Chronic kidney disease, stage 3b ([place]) - Ambulatory referral to Nephrology - AMB Referral to [place]  11. Leg weakness, bilateral - Ambulatory referral to Physical Therapy  12. Ataxia - Ambulatory referral to Physical Therapy  13. Constipation:  Trial on metamucil combined with miralax twice daily.   Meds ordered this encounter  Medications  . dapagliflozin propanediol (FARXIGA) 10 MG TABS tablet    Sig: Take 1 tablet (10 mg total) by mouth daily before breakfast.    Dispense:  30 tablet    Refill:  2  . silver sulfADIAZINE (SILVADENE) 1 % cream    Sig: Apply 1 application topically daily.    Dispense:  50 g    Refill:  0    Orders Placed This Encounter  Procedures  . CBC with Differential/Platelet  . Comprehensive metabolic panel  . Hemoglobin A1c  . Lipid panel  . TSH  . B12 and Folate Panel  . Methylmalonic acid, serum  . Ambulatory referral to Physical Therapy  . Ambulatory referral to Nephrology  . AMB Referral to [facility]  Coordinaton     Follow-up: Return in about 3 months (around 01/11/2021) for fasting.  An After Visit Summary was printed and given to the patient.  [name], MD [facility] [phone]

## 2020-10-14 ENCOUNTER — Telehealth: Payer: Self-pay | Admitting: Family Medicine

## 2020-10-14 NOTE — Progress Notes (Signed)
  Chronic Care Management   Note  10/14/2020 Name: Paul Kent MRN: [id number] DOB: 31-Jan-1941  Paul Kent is a 80 y.o. year old male who is a primary care patient of Cox, Kirsten, MD. I reached out to [name] by phone today in response to a referral sent by Paul Kent PCP, Cox, Kirsten, MD.   Paul Kent was given information about Chronic Care Management services today including:  1. CCM service includes personalized support from designated clinical staff supervised by his physician, including individualized plan of care and coordination with other care providers 2. 24/7 contact phone numbers for assistance for urgent and routine care needs. 3. Service will only be billed when office clinical staff spend 20 minutes or more in a month to coordinate care. 4. Only one practitioner may furnish and bill the service in a calendar month. 5. The patient may stop CCM services at any time (effective at the end of the month) by phone call to the office staff.   Patient agreed to services and verbal consent obtained.   Follow up plan:   Carley Perdue [employer] Scheduler

## 2020-10-17 ENCOUNTER — Encounter: Payer: Self-pay | Admitting: Family Medicine

## 2020-10-18 DIAGNOSIS — I509 Heart failure, unspecified: Secondary | ICD-10-CM | POA: Diagnosis not present

## 2020-10-18 DIAGNOSIS — N1832 Chronic kidney disease, stage 3b: Secondary | ICD-10-CM | POA: Diagnosis not present

## 2020-10-18 DIAGNOSIS — I480 Paroxysmal atrial fibrillation: Secondary | ICD-10-CM | POA: Diagnosis not present

## 2020-10-18 DIAGNOSIS — E785 Hyperlipidemia, unspecified: Secondary | ICD-10-CM | POA: Diagnosis not present

## 2020-10-18 DIAGNOSIS — E1122 Type 2 diabetes mellitus with diabetic chronic kidney disease: Secondary | ICD-10-CM | POA: Diagnosis not present

## 2020-10-18 DIAGNOSIS — R319 Hematuria, unspecified: Secondary | ICD-10-CM | POA: Diagnosis not present

## 2020-10-19 DIAGNOSIS — R2689 Other abnormalities of gait and mobility: Secondary | ICD-10-CM | POA: Diagnosis not present

## 2020-10-19 DIAGNOSIS — M6281 Muscle weakness (generalized): Secondary | ICD-10-CM | POA: Diagnosis not present

## 2020-10-21 ENCOUNTER — Other Ambulatory Visit: Payer: Self-pay | Admitting: Nurse Practitioner

## 2020-10-21 DIAGNOSIS — R29898 Other symptoms and signs involving the musculoskeletal system: Secondary | ICD-10-CM | POA: Diagnosis not present

## 2020-10-21 DIAGNOSIS — R0602 Shortness of breath: Secondary | ICD-10-CM | POA: Diagnosis not present

## 2020-10-21 DIAGNOSIS — L97411 Non-pressure chronic ulcer of right heel and midfoot limited to breakdown of skin: Secondary | ICD-10-CM | POA: Diagnosis not present

## 2020-10-21 DIAGNOSIS — E08621 Diabetes mellitus due to underlying condition with foot ulcer: Secondary | ICD-10-CM

## 2020-10-21 DIAGNOSIS — R202 Paresthesia of skin: Secondary | ICD-10-CM

## 2020-10-21 DIAGNOSIS — E039 Hypothyroidism, unspecified: Secondary | ICD-10-CM

## 2020-10-29 LAB — CARDIOVASCULAR RISK ASSESSMENT

## 2020-10-29 LAB — HEMOGLOBIN A1C
Est. average glucose Bld gHb Est-mCnc: 235 mg/dL
Hgb A1c MFr Bld: 9.8 % — ABNORMAL HIGH (ref 4.8–5.6)

## 2020-10-29 LAB — CBC WITH DIFFERENTIAL/PLATELET
Basophils Absolute: 0.1 10*3/uL (ref 0.0–0.2)
Basos: 2 %
EOS (ABSOLUTE): 0.2 10*3/uL (ref 0.0–0.4)
Eos: 5 %
Hematocrit: 45.8 % (ref 37.5–51.0)
Hemoglobin: 15.5 g/dL (ref 13.0–17.7)
Immature Grans (Abs): 0 10*3/uL (ref 0.0–0.1)
Immature Granulocytes: 1 %
Lymphocytes Absolute: 1 10*3/uL (ref 0.7–3.1)
Lymphs: 22 %
MCH: 33.5 pg — ABNORMAL HIGH (ref 26.6–33.0)
MCHC: 33.8 g/dL (ref 31.5–35.7)
MCV: 99 fL — ABNORMAL HIGH (ref 79–97)
Monocytes Absolute: 0.6 10*3/uL (ref 0.1–0.9)
Monocytes: 14 %
Neutrophils Absolute: 2.5 10*3/uL (ref 1.4–7.0)
Neutrophils: 56 %
Platelets: 206 10*3/uL (ref 150–450)
RBC: 4.63 x10E6/uL (ref 4.14–5.80)
RDW: 13.1 % (ref 11.6–15.4)
WBC: 4.4 10*3/uL (ref 3.4–10.8)

## 2020-10-29 LAB — COMPREHENSIVE METABOLIC PANEL
ALT: 29 IU/L (ref 0–44)
AST: 24 IU/L (ref 0–40)
Albumin/Globulin Ratio: 1.4 (ref 1.2–2.2)
Albumin: 3.9 g/dL (ref 3.6–4.6)
Alkaline Phosphatase: 115 IU/L (ref 44–121)
BUN/Creatinine Ratio: 14 (ref 10–24)
BUN: 28 mg/dL — ABNORMAL HIGH (ref 8–27)
Bilirubin Total: 0.9 mg/dL (ref 0.0–1.2)
CO2: 24 mmol/L (ref 20–29)
Calcium: 8.9 mg/dL (ref 8.6–10.2)
Chloride: 100 mmol/L (ref 96–106)
Creatinine, Ser: 1.99 mg/dL — ABNORMAL HIGH (ref 0.76–1.27)
Globulin, Total: 2.8 g/dL (ref 1.5–4.5)
Glucose: 200 mg/dL — ABNORMAL HIGH (ref 65–99)
Potassium: 4.9 mmol/L (ref 3.5–5.2)
Sodium: 140 mmol/L (ref 134–144)
Total Protein: 6.7 g/dL (ref 6.0–8.5)
eGFR: 33 mL/min/{1.73_m2} — ABNORMAL LOW (ref >59)

## 2020-10-29 LAB — B12 AND FOLATE PANEL
Folate: 10.4 ng/mL (ref >3.0)
Vitamin B-12: 486 pg/mL (ref 232–1245)

## 2020-10-29 LAB — TSH: TSH: 3.64 u[IU]/mL (ref 0.450–4.500)

## 2020-10-29 LAB — LIPID PANEL
Chol/HDL Ratio: 3.1 ratio (ref 0.0–5.0)
Cholesterol, Total: 117 mg/dL (ref 100–199)
HDL: 38 mg/dL — ABNORMAL LOW (ref >39)
LDL Chol Calc (NIH): 62 mg/dL (ref 0–99)
Triglycerides: 86 mg/dL (ref 0–149)
VLDL Cholesterol Cal: 17 mg/dL (ref 5–40)

## 2020-10-29 LAB — METHYLMALONIC ACID, SERUM: Methylmalonic Acid: 426 nmol/L — ABNORMAL HIGH (ref 0–378)

## 2020-11-02 ENCOUNTER — Other Ambulatory Visit: Payer: Self-pay

## 2020-11-09 ENCOUNTER — Other Ambulatory Visit: Payer: Self-pay | Admitting: Family Medicine

## 2020-11-15 NOTE — Progress Notes (Signed)
Subjective:  Patient ID: Paul Kent, male    DOB: [birth year]  Age: 80 y.o. MRN: [id number]  Chief Complaint  Patient presents with   Annual Exam   HPI Encounter for general adult medical examination without abnormal findings  Physical ("At Risk" items are starred): Patient's last physical exam was 1 year ago .  Safety: reviewed ;  Patient wears a seat belt. Patient's home has smoke detectors and carbon monoxide detectors. Patient practices appropriate gun safety Patient wears sunscreen with extended sun exposure. Dental Care: biannual cleanings, brushes and flosses daily. Ophthalmology/Optometry: Annual visit.  Hearing loss: none Vision impairments: none Patient is not afflicted from Stress Incontinence and Urge Incontinence      SDOH Screenings   Alcohol Screen: Low Risk    Last Alcohol Screening Score (AUDIT): 0  Depression (PHQ2-9): Low Risk    PHQ-2 Score: 0  Financial Resource Strain: Not on file  Food Insecurity: Not on file  Housing: Not on file  Physical Activity: Not on file  Social Connections: Not on file  Stress: Not on file  Tobacco Use: Low Risk    Smoking Tobacco Use: Never   Smokeless Tobacco Use: Never  Transportation Needs: Not on file    Fall Risk  11/16/2020 06/29/2020 02/04/2019 02/17/2016  Falls in the past year? 1 0 1 No  Comment - - - Emmi Telephone Survey: data to providers prior to load  Number falls in past yr: 0 0 0 -  Injury with Fall? 1 0 0 -  Risk for fall due to : - Impaired balance/gait - -  Follow up - Falls evaluation completed - -    Depression screen [facility] 2/9 11/16/2020 06/29/2020  Decreased Interest 0 0  Down, Depressed, Hopeless 0 0  PHQ - 2 Score 0 0    Functional Status Survey: Is the patient deaf or have difficulty hearing?: No Does the patient have difficulty seeing, even when wearing glasses/contacts?: No Does the patient have difficulty concentrating, remembering, or making decisions?: No Does the patient have  difficulty walking or climbing stairs?: Yes Does the patient have difficulty dressing or bathing?: No Does the patient have difficulty doing errands alone such as visiting a doctor's office or shopping?: No Is the patient deaf or have difficulty hearing?: No Does the patient have difficulty seeing, even when wearing glasses/contacts?: No Does the patient have difficulty concentrating, remembering, or making decisions?: No Does the patient have difficulty walking or climbing stairs?: Yes Does the patient have difficulty dressing or bathing?: No Does the patient have difficulty doing errands alone such as visiting a doctor's office or shopping?: No   Current Outpatient Medications on File Prior to Visit  Medication Sig Dispense Refill   amiodarone (PACERONE) 200 MG tablet TAKE ONE TABLET BY MOUTH EVERY DAY 90 tablet 1   atorvastatin (LIPITOR) 10 MG tablet TAKE ONE TABLET BY MOUTH EVERY EVENING 90 tablet 2   carvedilol (COREG) 12.5 MG tablet Take 1 tablet (12.5 mg total) by mouth 2 (two) times daily. 180 tablet 1   Cyanocobalamin (VITAMIN B-12) 2500 MCG SUBL Place 2,500 mcg under the tongue. Daily     dapagliflozin propanediol (FARXIGA) 10 MG TABS tablet Take 1 tablet (10 mg total) by mouth daily before breakfast. 30 tablet 2   furosemide (LASIX) 40 MG tablet Take 1.5 tablets (60 mg total) by mouth daily. (Patient taking differently: Take 60 mg by mouth daily as needed.) 135 tablet 2   gabapentin (NEURONTIN) 300 MG capsule TAKE  ONE CAPSULE BY MOUTH 3 TIMES DAILY 90 capsule 5   glipiZIDE (GLUCOTROL XL) 10 MG 24 hr tablet TAKE ONE TABLET BY MOUTH TWICE DAILY 180 tablet 1   levothyroxine (SYNTHROID) 137 MCG tablet TAKE ONE TABLET BY MOUTH EVERY MORNING 90 tablet 1   silver sulfADIAZINE (SILVADENE) 1 % cream Apply 1 application topically daily. 50 g 0   testosterone cypionate (DEPOTESTOSTERONE CYPIONATE) 200 MG/ML injection INJECT 1 MILLILITER IN THE MUSCLE EVERY 2 WEEKS 10 mL 1   XARELTO 20 MG TABS  tablet TAKE ONE TABLET BY MOUTH EVERY EVENING 90 tablet 1   zolpidem (AMBIEN) 10 MG tablet Take 1 tablet (10 mg total) by mouth at bedtime as needed for sleep. 30 tablet 5   Current Facility-Administered Medications on File Prior to Visit  Medication Dose Route Frequency Provider Last Rate Last Admin   triamcinolone acetonide (KENALOG-40) injection 20 mg  20 mg Intra-articular Once Giacomo Valone, Elnita Maxwell, MD        Social Hx   Social History   Socioeconomic History   Marital status: Married    Spouse name: [name]   Number of children: 3   Years of education: Not on file   Highest education level: Not on file  Occupational History   Occupation: RETIRED    Comment: school principal  Tobacco Use   Smoking status: Never   Smokeless tobacco: Never  Vaping Use   Vaping Use: Never used  Substance and Sexual Activity   Alcohol use: Not Currently   Drug use: Never   Sexual activity: Not on file  Other Topics Concern   Not on file  Social History Narrative   Lives with wife   Social Determinants of Health   Financial Resource Strain: Not on file  Food Insecurity: Not on file  Transportation Needs: Not on file  Physical Activity: Not on file  Stress: Not on file  Social Connections: Not on file   Past Medical History:  Diagnosis Date   Acquired bilateral hammer toes    Arthralgia of left temporomandibular joint    Atrial fibrillation ([place]) 05/2016   Bladder cancer ([place])    Bradycardia    LOW HEART RATE   Calculus of ureter    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   Cellulitis of right lower limb    CHF (congestive heart failure) ([place]) 02/2018   Chronic atrial fibrillation ([place])    Corns and callosities    Deviated septum 07/01/2018   Diabetes mellitus due to underlying condition with unspecified complications ([place]) 91/63/8466   Disturbances of salivary secretion    Epistaxis    Essential hypertension    Essential tremor    H pylori ulcer    Herpes zoster with nervous system  complication 59/93/5701   Hesitancy of micturition    Hyperlipidemia    Hypothyroidism    Impacted cerumen, bilateral    Jaw pain    Lesion of femoral nerve 04/30/2013   Localized edema    Male erectile disorder    Malignant neoplasm of overlapping sites of bladder ([place])    Nasal congestion 07/01/2018   Nasal turbinate hypertrophy 07/01/2018   Obstructive sleep apnea 01/17/2017   Other constipation    Other fatigue    Overweight    Pacemaker reprogramming/check 02/12/2017   Pain in right finger(s)    Pain in right lower leg    Paroxysmal atrial fibrillation ([place]) 06/08/2016   Peptic ulcer disease    Persistent atrial fibrillation ([place]) 06/01/2017   Primary  insomnia    Renal stones    Renal stones    Secondary hypercoagulable state ([place]) 06/17/2020   Shingles 9357   WITH COMPLICATIONS (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)   Shingles 06/2012   Shortness of breath    Sick sinus syndrome (H[facility])    Sinus bradycardia 03/07/2017   Sleep apnea    dx 08/2015, CPAP   Spontaneous ecchymoses    Squamous cell carcinoma of skin    UNSPECIFIED   Testicular hypofunction    Thoracic or lumbosacral neuritis or radiculitis 06/03/2013   Trigger middle finger of right hand 11/28/2019   Type 2 diabetes mellitus ([place]) 1994   UNCONTROLLED, COMPLICATIONS INCLUDE NEPHROPATHY AND PERIPHERAL NEUROPATHY   Type 2 diabetes mellitus with circulatory disorder, 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[place]) 01/17/2017   Family History  Problem Relation Age of Onset   Heart attack Mother    Transient ischemic attack Mother    Heart Problems Father    Tuberculosis Brother    Diabetes type II Other    Hyperlipidemia Other    Congestive Heart Failure Other    Arthritis Other     Review of Systems  Constitutional:  Negative for chills, fatigue, fever and unexpected weight change.  HENT:  Negative for congestion, ear pain, sinus pain and sore throat.   Cardiovascular:  Negative for  chest pain and palpitations.  Gastrointestinal:  Negative for abdominal pain, blood in stool, constipation, diarrhea, nausea and vomiting.  Endocrine: Negative for polydipsia.  Genitourinary:  Negative for dysuria.  Musculoskeletal:  Negative for back pain.  Skin:  Negative for rash.  Neurological:  Positive for weakness (ATAXIA AND STRENGTH IMPROVED WITH PHYSICAL THERAPY. ). Negative for headaches.  Psychiatric/Behavioral:  Negative for dysphoric mood. The patient is not nervous/anxious.     Objective:  BP 114/60   Pulse 72   Temp (!) 96.6 F (35.9 C)   Resp 18   Ht 6\' 2"  (1.88 m)   Wt 233 lb 9.6 oz (106 kg)   BMI 29.99 kg/m   BP/Weight 11/16/2020 10/12/2020 0/17/7939  Systolic BP 030 092 330  Diastolic BP 60 70 76  Wt. (Lbs) 233.6 239 230  BMI 29.99 30.69 29.53    Physical Exam Constitutional:      Appearance: Normal appearance.  Cardiovascular:     Rate and Rhythm: Normal rate. Rhythm irregular.     Pulses: Normal pulses.     Heart sounds: Normal heart sounds.  Pulmonary:     Effort: Pulmonary effort is normal.     Breath sounds: Normal breath sounds.  Abdominal:     General: Bowel sounds are normal.     Palpations: Abdomen is soft. There is no mass.     Tenderness: There is no abdominal tenderness.  Neurological:     Mental Status: He is alert and oriented to person, place, and time.     Gait: Gait abnormal (USES WALKING STICK. RIGHT LEG HAS FOOT DROP. ).  Psychiatric:        Mood and Affect: Mood normal.        Behavior: Behavior normal.        Thought Content: Thought content normal.    Lab Results  Component Value Date   WBC 4.4 10/21/2020   HGB 15.5 10/21/2020   HCT 45.8 10/21/2020   PLT 206 10/21/2020   GLUCOSE 200 (H) 10/21/2020   CHOL 117 10/21/2020   TRIG 86 10/21/2020   HDL 38 (L) 10/21/2020  LDLCALC 62 10/21/2020   ALT 29 10/21/2020   AST 24 10/21/2020   NA 140 10/21/2020   K 4.9 10/21/2020   CL 100 10/21/2020   CREATININE 1.99 (H)  10/21/2020   BUN 28 (H) 10/21/2020   CO2 24 10/21/2020   TSH 3.640 10/21/2020   HGBA1C 9.8 (H) 10/21/2020      Assessment & Plan:  1. Encounter for general adult medical examination with abnormal findings 2. BMI 29.0-29.9,adult  These are the goals we discussed: Goals   Recommend continue to work on eating healthy diet and exercise. Fall precautions discussed. Continue balance exercises.  Sugar improve. Tolerating new medicine (farxiga.) Pt has decreased sugar and carbs in diet.  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       This is a list of the screening recommended for you and due dates:  Health Maintenance  Topic Date Due   Eye exam for diabetics  Never done   Urine Protein Check  Never done   Tetanus Vaccine  Never done   Flu Shot  01/17/2021   COVID-19 Vaccine (5 - Booster for Moderna series) 02/11/2021   Hemoglobin A1C  04/23/2021   Complete foot exam   10/12/2021   Pneumonia vaccines  Completed   Zoster (Shingles) Vaccine  Completed   HPV Vaccine  Aged Out    Follow-up: Return in about 2 months (around 01/20/2021) for fasting.   [name], MD [facility] [phone]

## 2020-11-16 ENCOUNTER — Ambulatory Visit (INDEPENDENT_AMBULATORY_CARE_PROVIDER_SITE_OTHER): Payer: Medicare Other | Admitting: Family Medicine

## 2020-11-16 ENCOUNTER — Other Ambulatory Visit: Payer: Self-pay

## 2020-11-16 ENCOUNTER — Encounter: Payer: Self-pay | Admitting: Family Medicine

## 2020-11-16 VITALS — BP 114/60 | HR 72 | Temp 96.6°F | Resp 18 | Ht 74.0 in | Wt 233.6 lb

## 2020-11-16 DIAGNOSIS — Z6829 Body mass index (BMI) 29.0-29.9, adult: Secondary | ICD-10-CM

## 2020-11-16 DIAGNOSIS — Z0001 Encounter for general adult medical examination with abnormal findings: Secondary | ICD-10-CM | POA: Diagnosis not present

## 2020-11-16 NOTE — Patient Instructions (Addendum)
These are the goals we discussed: Goals   Recommend continue to work on eating healthy diet and exercise. Fall precautions discussed. Continue balance exercises.  Sugar improve. Tolerating new medicine (farxiga.) Pt has decreased sugar and carbs in diet.  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   Preventive Care 47 Years and Older, Male Preventive care refers to lifestyle choices and visits with your health care provider that can promote health and wellness. This includes:  A yearly physical exam. This is also called an annual wellness visit.  Regular dental and eye exams.  Immunizations.  Screening for certain conditions.  Healthy lifestyle choices, such as: ? Eating a healthy diet. ? Getting regular exercise. ? Not using drugs or products that contain nicotine and tobacco. ? Limiting alcohol use. What can I expect for my preventive care visit? Physical exam Your health care provider will check your:  Height and weight. These may be used to calculate your BMI (body mass index). BMI is a measurement that tells if you are at a healthy weight.  Heart rate and blood pressure.  Body temperature.  Skin for abnormal spots. Counseling Your health care provider may ask you questions about your:  Past medical problems.  Family's medical history.  Alcohol, tobacco, and drug use.  Emotional well-being.  Home life and relationship well-being.  Sexual activity.  Diet, exercise, and sleep habits.  History of falls.  Memory and ability to understand (cognition).  Work and work Statistician.  Access to firearms. What immunizations do I need? Vaccines are usually given at various ages, according to a schedule. Your health care provider will recommend vaccines for you based on your age, medical history, and lifestyle or other factors, such as travel or where you work.   What tests do I need? Blood  tests  Lipid and cholesterol levels. These may be checked every 5 years, or more often depending on your overall health.  Hepatitis C test.  Hepatitis B test. Screening  Lung cancer screening. You may have this screening every year starting at age 64 if you have a 30-pack-year history of smoking and currently smoke or have quit within the past 15 years.  Colorectal cancer screening. ? All adults should have this screening starting at age 82 and continuing until age 82. ? Your health care provider may recommend screening at age 54 if you are at increased risk. ? You will have tests every 1-10 years, depending on your results and the type of screening test.  Prostate cancer screening. Recommendations will vary depending on your family history and other risks.  Genital exam to check for testicular cancer or hernias.  Diabetes screening. ? This is done by checking your blood sugar (glucose) after you have not eaten for a while (fasting). ? You may have this done every 1-3 years.  Abdominal aortic aneurysm (AAA) screening. You may need this if you are a current or former smoker.  STD (sexually transmitted disease) testing, if you are at risk. Follow these instructions at home: Eating and drinking  Eat a diet that includes fresh fruits and vegetables, whole grains, lean protein, and low-fat dairy products. Limit your intake of foods with high amounts of sugar, saturated fats, and salt.  Take vitamin and mineral supplements as recommended by your health care provider.  Do not drink alcohol if your health care provider tells you not to drink.  If you drink alcohol: ?  Limit how much you have to 0-2 drinks a day. ? Be aware of how much alcohol is in your drink. In the [place] one drink equals one 12 oz bottle of beer (355 mL), one 5 oz glass of wine (148 mL), or one 1 oz glass of hard liquor (44 mL).   Lifestyle  Take daily care of your teeth and gums. Brush your teeth every morning and  night with fluoride toothpaste. Floss one time each day.  Stay active. Exercise for at least 30 minutes 5 or more days each week.  Do not use any products that contain nicotine or tobacco, such as cigarettes, e-cigarettes, and chewing tobacco. If you need help quitting, ask your health care provider.  Do not use drugs.  If you are sexually active, practice safe sex. Use a condom or other form of protection to prevent STIs (sexually transmitted infections).  Talk with your health care provider about taking a low-dose aspirin or statin.  Find healthy ways to cope with stress, such as: ? Meditation, yoga, or listening to music. ? Journaling. ? Talking to a trusted person. ? Spending time with friends and family. Safety  Always wear your seat belt while driving or riding in a vehicle.  Do not drive: ? If you have been drinking alcohol. Do not ride with someone who has been drinking. ? When you are tired or distracted. ? While texting.  Wear a helmet and other protective equipment during sports activities.  If you have firearms in your house, make sure you follow all gun safety procedures. What's next?  Visit your health care provider once a year for an annual wellness visit.  Ask your health care provider how often you should have your eyes and teeth checked.  Stay up to date on all vaccines. This information is not intended to replace advice given to you by your health care provider. Make sure you discuss any questions you have with your health care provider. Document Revised: 03/04/2019 Document Reviewed: 05/30/2018 Elsevier Patient Education  2021 [employer].

## 2020-12-03 ENCOUNTER — Telehealth: Payer: Medicare Other

## 2020-12-08 LAB — CUP PACEART REMOTE DEVICE CHECK
Battery Remaining Percentage: 60 %
Brady Statistic RA Percent Paced: 49 %
Brady Statistic RV Percent Paced: 99 %
Date Time Interrogation Session: 20220622084037
Implantable Lead Implant Date: 20180813
Implantable Lead Implant Date: 20180813
Implantable Lead Location: 753859
Implantable Lead Location: 753860
Implantable Lead Model: 377
Implantable Lead Model: 377
Implantable Lead Serial Number: 49893169
Implantable Lead Serial Number: 50011411
Implantable Pulse Generator Implant Date: 20180813
Lead Channel Impedance Value: 488 Ohm
Lead Channel Impedance Value: 527 Ohm
Lead Channel Sensing Intrinsic Amplitude: 0.3 mV
Lead Channel Sensing Intrinsic Amplitude: 10.2 mV
Lead Channel Setting Pacing Amplitude: 2.4 V
Lead Channel Setting Pacing Amplitude: 2.4 V
Lead Channel Setting Pacing Pulse Width: 0.4 ms
Pulse Gen Model: 407145
Pulse Gen Serial Number: 69158272

## 2020-12-09 ENCOUNTER — Ambulatory Visit (INDEPENDENT_AMBULATORY_CARE_PROVIDER_SITE_OTHER): Payer: Medicare Other

## 2020-12-09 DIAGNOSIS — I495 Sick sinus syndrome: Secondary | ICD-10-CM | POA: Diagnosis not present

## 2020-12-13 ENCOUNTER — Other Ambulatory Visit: Payer: Self-pay

## 2020-12-13 ENCOUNTER — Encounter: Payer: Self-pay | Admitting: Cardiology

## 2020-12-13 ENCOUNTER — Ambulatory Visit (INDEPENDENT_AMBULATORY_CARE_PROVIDER_SITE_OTHER): Payer: Medicare Other | Admitting: Cardiology

## 2020-12-13 VITALS — BP 112/70 | HR 60 | Ht 75.0 in | Wt 234.0 lb

## 2020-12-13 DIAGNOSIS — I4819 Other persistent atrial fibrillation: Secondary | ICD-10-CM

## 2020-12-13 NOTE — Patient Instructions (Signed)
Medication Instructions:  Your physician recommends that you continue on your current medications as directed. Please refer to the Current Medication list given to you today.  *If you need a refill on your cardiac medications before your next appointment, please call your pharmacy*   Lab Work: None ordered   Testing/Procedures: None ordered   Follow-Up: At [facility], you and your health needs are our priority.  As part of our continuing mission to provide you with exceptional heart care, we have created designated Provider Care Teams.  These Care Teams include your primary Cardiologist (physician) and Advanced Practice Providers (APPs -  Physician Assistants and Nurse Practitioners) who all work together to provide you with the care you need, when you need it.  Remote monitoring is used to monitor your Pacemaker or ICD from home. This monitoring reduces the number of office visits required to check your device to one time per year. It allows [place] to keep an eye on the functioning of your device to ensure it is working properly. You are scheduled for a device check from home on 03/10/2021. You may send your transmission at any time that day. If you have a wireless device, the transmission will be sent automatically. After your physician reviews your transmission, you will receive a postcard with your next transmission date.  Your next appointment:   6 month(s)  The format for your next appointment:   In Person  Provider:   Allegra Lai, MD   Thank you for choosing [facility]!!   Trinidad Curet, RN [phone]

## 2020-12-13 NOTE — Progress Notes (Signed)
Electrophysiology Office Note   Date:  12/13/2020   ID:  Paul Kent 11-26-1940, MRN [id number]  PCP:  [name], MD  Cardiologist:  Primary Electrophysiologist:  Joani Cosma [name], MD    No chief complaint on file.    History of Present Illness: Paul Kent is a 80 y.o. male who is being seen today for the evaluation of atrial fibrillation at the request of Cox, Kirsten, MD. Presenting today for electrophysiology evaluation.    He has a history of persistent atrial fibrillation, hypertension, hyperlipidemia, obstructive sleep apnea, type 2 diabetes, sick sinus syndrome status post Biotronik dual-chamber pacemaker.  He had a cryoablation in 2019.  He was switched from flecainide to amiodarone.  He had repeat ablation 05/21/2020.  Unfortunately continued to have episodes of atrial fibrillation and atrial flutter.  He is now status post repeat cardioversion.  Unfortunately has had more frequent episodes and his Cardizem was increased.  Today, denies symptoms of palpitations, chest pain, shortness of breath, orthopnea, PND, lower extremity edema, claudication, dizziness, presyncope, syncope, bleeding, or neurologic sequela. The patient is tolerating medications without difficulties.  He continues to have days where he feels good and days that he feels bad.  He states that in May, he felt quite well without major complaint.  He feels weak and fatigued on other days.  He was having some stomach issues, but those have gotten better.  He continues to go gym, though he states that he sleeps quite a bit.   Past Medical History:  Diagnosis Date   Acquired bilateral hammer toes    Arthralgia of left temporomandibular joint    Atrial fibrillation ([place]) 05/2016   Bladder cancer (Paul Kent)    Bradycardia    LOW HEART RATE   Calculus of ureter    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   Cellulitis of right lower limb    CHF (congestive heart failure) ([place]) 02/2018   Chronic atrial  fibrillation (HCC)    Corns and callosities    Deviated septum 07/01/2018   Diabetes mellitus due to underlying condition with unspecified complications (Paul Kent) 66/44/0347   Disturbances of salivary secretion    Epistaxis    Essential hypertension    Essential tremor    H pylori ulcer    Herpes zoster with nervous system complication 42/59/5638   Hesitancy of micturition    Hyperlipidemia    Hypothyroidism    Impacted cerumen, bilateral    Jaw pain    Lesion of femoral nerve 04/30/2013   Localized edema    Male erectile disorder    Malignant neoplasm of overlapping sites of bladder ([place])    Nasal congestion 07/01/2018   Nasal turbinate hypertrophy 07/01/2018   Obstructive sleep apnea 01/17/2017   Other constipation    Other fatigue    Overweight    Pacemaker reprogramming/check 02/12/2017   Pain in right finger(s)    Pain in right lower leg    Paroxysmal atrial fibrillation (Paul Kent) 06/08/2016   Peptic ulcer disease    Persistent atrial fibrillation (Paul Kent) 06/01/2017   Primary insomnia    Renal stones    Renal stones    Secondary hypercoagulable state ([place]) 06/17/2020   Shingles 7564   WITH COMPLICATIONS (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)   Shingles 06/2012   Shortness of breath    Sick sinus syndrome (HCC)    Sinus bradycardia 03/07/2017   Sleep apnea    dx 08/2015, CPAP   Spontaneous ecchymoses  Squamous cell carcinoma of skin    UNSPECIFIED   Testicular hypofunction    Thoracic or lumbosacral neuritis or radiculitis 06/03/2013   Trigger middle finger of right hand 11/28/2019   Type 2 diabetes mellitus ([place]) 1994   UNCONTROLLED, COMPLICATIONS INCLUDE NEPHROPATHY AND PERIPHERAL NEUROPATHY   Type 2 diabetes mellitus with circulatory disorder, 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Paul Kent) 01/17/2017   Past Surgical History:  Procedure Laterality Date   ABLATION  06/2017   ATRIAL FIBRILLATION ABLATION N/A 05/21/2020   Procedure: ATRIAL FIBRILLATION  ABLATION;  Surgeon: Constance Haw, MD;  Location: [place] [facility];  Service: Cardiovascular;  Laterality: N/A;   CARDIOVERSION N/A 03/22/2018   Procedure: CARDIOVERSION;  Surgeon: Buford Dresser, MD;  Location: [facility] ENDOSCOPY;  Service: Cardiovascular;  Laterality: N/A;   CARDIOVERSION N/A 07/08/2020   Procedure: CARDIOVERSION;  Surgeon: Jerline Pain, MD;  Location: [facility] ENDOSCOPY;  Service: Cardiovascular;  Laterality: N/A;   HEMIARTHROPLASTY HIP  10/31/2013   IT HIP BIPOLAR   LUMBAR SPINE SURGERY     PACEMAKER PLACEMENT  01/29/2017   SHOULDER SURGERY Right      Current Outpatient Medications  Medication Sig Dispense Refill   amiodarone (PACERONE) 200 MG tablet TAKE ONE TABLET BY MOUTH EVERY DAY 90 tablet 1   atorvastatin (LIPITOR) 10 MG tablet TAKE ONE TABLET BY MOUTH EVERY EVENING 90 tablet 2   carvedilol (COREG) 12.5 MG tablet Take 1 tablet (12.5 mg total) by mouth 2 (two) times daily. 180 tablet 1   dapagliflozin propanediol (FARXIGA) 10 MG TABS tablet Take 1 tablet (10 mg total) by mouth daily before breakfast. 30 tablet 2   gabapentin (NEURONTIN) 300 MG capsule TAKE ONE CAPSULE BY MOUTH 3 TIMES DAILY 90 capsule 5   glipiZIDE (GLUCOTROL XL) 10 MG 24 hr tablet TAKE ONE TABLET BY MOUTH TWICE DAILY 180 tablet 1   levothyroxine (SYNTHROID) 137 MCG tablet TAKE ONE TABLET BY MOUTH EVERY MORNING 90 tablet 1   silver sulfADIAZINE (SILVADENE) 1 % cream Apply 1 application topically daily. 50 g 0   testosterone cypionate (DEPOTESTOSTERONE CYPIONATE) 200 MG/ML injection INJECT 1 MILLILITER IN THE MUSCLE EVERY 2 WEEKS 10 mL 1   XARELTO 20 MG TABS tablet TAKE ONE TABLET BY MOUTH EVERY EVENING 90 tablet 1   zolpidem (AMBIEN) 10 MG tablet Take 1 tablet (10 mg total) by mouth at bedtime as needed for sleep. 30 tablet 5   Current Facility-Administered Medications  Medication Dose Route Frequency Provider Last Rate Last Admin   triamcinolone acetonide (KENALOG-40) injection 20 mg  20  mg Intra-articular Once Cox, Kirsten, MD        Allergies:   Propranolol, Clonidine derivatives, Hydralazine hcl, Invokana [canagliflozin], Metformin and related, and Topamax [topiramate]   Social History:  The patient  reports that he has never smoked. He has never used smokeless tobacco. He reports previous alcohol use. He reports that he does not use drugs.   Family History:  The patient's family history includes Arthritis in an other family member; Congestive Heart Failure in an other family member; Diabetes type II in an other family member; Heart Problems in his father; Heart attack in his mother; Hyperlipidemia in an other family member; Transient ischemic attack in his mother; Tuberculosis in his brother.   ROS:  Please see the history of present illness.   Otherwise, review of systems is positive for none.   All other systems are reviewed and negative.   PHYSICAL EXAM: VS:  BP 112/70  Pulse 60   Ht 6\' 3"  (1.905 m)   Wt 234 lb (106.1 kg)   SpO2 98%   BMI 29.25 kg/m  , BMI Body mass index is 29.25 kg/m. GEN: Well nourished, well developed, in no acute distress  HEENT: normal  Neck: no JVD, carotid bruits, or masses Cardiac: RRR; no murmurs, rubs, or gallops,no edema  Respiratory:  clear to auscultation bilaterally, normal work of breathing GI: soft, nontender, nondistended, + BS MS: no deformity or atrophy  Skin: warm and dry, device site well healed Neuro:  Strength and sensation are intact Psych: euthymic mood, full affect  EKG:  EKG is ordered today. Personal review of the ekg ordered shows atrial fibrillation, ventricular paced  Personal review of the device interrogation today. Results in [place]: 09/30/2020: NT-Pro BNP 1,483 10/21/2020: ALT 29; BUN 28; Creatinine, Ser 1.99; Hemoglobin 15.5; Platelets 206; Potassium 4.9; Sodium 140; TSH 3.640    Lipid Panel     Component Value Date/Time   CHOL 117 10/21/2020 0828   TRIG 86 10/21/2020 0828   HDL 38  (L) 10/21/2020 0828   CHOLHDL 3.1 10/21/2020 0828   LDLCALC 62 10/21/2020 0828     Wt Readings from Last 3 Encounters:  12/13/20 234 lb (106.1 kg)  11/16/20 233 lb 9.6 oz (106 kg)  10/12/20 239 lb (108.4 kg)      Other studies Reviewed: Additional studies/ records that were reviewed today include: TTE 03/01/18  Review of the above records today demonstrates:  - Left ventricle: The cavity size was normal. Wall thickness was   increased in a pattern of mild LVH. Systolic function was mildly   reduced. The estimated ejection fraction was in the range of 45%   to 50%. Diffuse hypokinesis. - Left atrium: The atrium was mildly dilated. Volume/bsa, S: 37.6   ml/m^2. - Right ventricle: The cavity size was moderately dilated. Wall   thickness was normal. Pacer wire or catheter noted in right   ventricle.   ASSESSMENT AND PLAN:  Persistent atrial fibrillation: Currently on Xarelto and amiodarone.  High risk medication monitoring.  Status post cryoablation in 2019.  CHA2DS2-VASc of 4.  Is status post RF ablation 05/21/2020.  He has had unfortunately more frequent episodes of atrial flutter versus atrial tachycardia.  He is status post cardioversion but has had recurrence.  His Cardizem was increased at his last visit.  Unfortunately he continues to have symptoms from his atrial fibrillation.  I do think that getting him to stay in rhythm Hilmer Aliberti make him feel quite a bit better.  I did offer ablation.  He Kailin Principato call us back with an answer.  He would like to discuss this further with his wife.  2.  Hypertension: Currently well controlled  3.  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  Device functioning appropriately.  No changes at this time.    4.  Obstructive sleep apnea: CPAP compliance encouraged  5.  Obesity: Diet and exercise encouraged   6.  Hyperlipidemia: Continue Lipitor   Current medicines are reviewed at length with the patient  today.   The patient does not have concerns regarding his medicines.  The following changes were made today: None  Labs/ tests ordered today include:  Orders Placed This Encounter  Procedures   EKG 12-Lead    Disposition:   FU with Debby Clyne 6 months  Signed, Garyn Arlotta [name], MD  12/13/2020 10:20 AM     [facility] [address] [place] [address] [phone] (office) [phone] (fax)

## 2020-12-21 ENCOUNTER — Other Ambulatory Visit: Payer: Self-pay | Admitting: Family Medicine

## 2020-12-21 DIAGNOSIS — L97411 Non-pressure chronic ulcer of right heel and midfoot limited to breakdown of skin: Secondary | ICD-10-CM

## 2020-12-21 DIAGNOSIS — E08621 Diabetes mellitus due to underlying condition with foot ulcer: Secondary | ICD-10-CM

## 2020-12-22 ENCOUNTER — Other Ambulatory Visit: Payer: Self-pay

## 2020-12-23 ENCOUNTER — Encounter: Payer: Self-pay | Admitting: Cardiology

## 2020-12-23 ENCOUNTER — Ambulatory Visit (INDEPENDENT_AMBULATORY_CARE_PROVIDER_SITE_OTHER): Payer: Medicare Other | Admitting: Cardiology

## 2020-12-23 ENCOUNTER — Other Ambulatory Visit: Payer: Self-pay

## 2020-12-23 VITALS — BP 112/60 | HR 60 | Ht 75.0 in | Wt 233.6 lb

## 2020-12-23 DIAGNOSIS — Z9889 Other specified postprocedural states: Secondary | ICD-10-CM | POA: Diagnosis not present

## 2020-12-23 DIAGNOSIS — R001 Bradycardia, unspecified: Secondary | ICD-10-CM | POA: Diagnosis not present

## 2020-12-23 DIAGNOSIS — E088 Diabetes mellitus due to underlying condition with unspecified complications: Secondary | ICD-10-CM

## 2020-12-23 DIAGNOSIS — I5032 Chronic diastolic (congestive) heart failure: Secondary | ICD-10-CM | POA: Diagnosis not present

## 2020-12-23 DIAGNOSIS — Z8679 Personal history of other diseases of the circulatory system: Secondary | ICD-10-CM | POA: Diagnosis not present

## 2020-12-23 DIAGNOSIS — I480 Paroxysmal atrial fibrillation: Secondary | ICD-10-CM

## 2020-12-23 DIAGNOSIS — Z950 Presence of cardiac pacemaker: Secondary | ICD-10-CM

## 2020-12-23 NOTE — Progress Notes (Signed)
Cardiology Office Note:    Date:  12/23/2020   ID:  Paul Kent, Paul Kent 03/29/1941, MRN [id number]  PCP:  [name], MD  Cardiologist:  Jenne Campus, MD    Referring MD: [name], MD   Chief Complaint  Patient presents with   Follow-up  Most of the time and feeling well but I do have some bad days sometimes  History of Present Illness:    Paul Kent is a 80 y.o. male  with past medical history significant for paroxysmal/persistent atrial fibrillation, essential hypertension, hyperlipidemia, obstructive sleep apnea, type 2 diabetes, sick sinus syndrome, status post Biotronik pacemaker implantation.  Status post atrial fibrillation cryoablation done first time in January 2019 post ablation he was switched from flecainide to amiodarone he had repeated ablation done on 21 May 2020.  Unfortunately after that he kept having episode of atrial fibrillation He is coming today to my office for follow-up overall he said majority of time he is doing well every other day he goes to gym and exercise on the regular basis.  However, he describes days when he feels very lousy.  He did meet our EP team and there was consideration given to potentially doing another atrial fibrillation ablation he is not keen on that.  He said he does not want to.  Denies have any swelling of lower extremities no chest pain tightness pressure burning in the chest no dizziness no passing out.  Past Medical History:  Diagnosis Date   Acquired bilateral hammer toes    Arthralgia of left temporomandibular joint    Atrial fibrillation ([place]) 05/2016   Bladder cancer ([place])    Bradycardia    LOW HEART RATE   Calculus of ureter    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   Cellulitis of right lower limb    CHF (congestive heart failure) ([place]) 02/2018   Chronic atrial fibrillation ([place])    Corns and callosities    Deviated septum 07/01/2018   Diabetes mellitus due to underlying condition with unspecified  complications ([place]) 89/38/1017   Disturbances of salivary secretion    Epistaxis    Essential hypertension    Essential tremor    H pylori ulcer    Herpes zoster with nervous system complication 51/07/5850   Hesitancy of micturition    Hyperlipidemia    Hypothyroidism    Impacted cerumen, bilateral    Jaw pain    Lesion of femoral nerve 04/30/2013   Localized edema    Male erectile disorder    Malignant neoplasm of overlapping sites of bladder ([place])    Nasal congestion 07/01/2018   Nasal turbinate hypertrophy 07/01/2018   Obstructive sleep apnea 01/17/2017   Other constipation    Other fatigue    Overweight    Pacemaker reprogramming/check 02/12/2017   Pain in right finger(s)    Pain in right lower leg    Paroxysmal atrial fibrillation ([place]) 06/08/2016   Peptic ulcer disease    Persistent atrial fibrillation ([place]) 06/01/2017   Primary insomnia    Renal stones    Renal stones    Secondary hypercoagulable state ([place]) 06/17/2020   Shingles 7782   WITH COMPLICATIONS (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)   Shingles 06/2012   Shortness of breath    Sick sinus syndrome ([place])    Sinus bradycardia 03/07/2017   Sleep apnea    dx 08/2015, CPAP   Spontaneous ecchymoses    Squamous cell carcinoma of skin    UNSPECIFIED  Status post ablation of atrial fibrillation 09/16/2020   Testicular hypofunction    Thoracic or lumbosacral neuritis or radiculitis 06/03/2013   Trigger middle finger of right hand 11/28/2019   Type 2 diabetes mellitus ([place]) 1994   UNCONTROLLED, COMPLICATIONS INCLUDE NEPHROPATHY AND PERIPHERAL NEUROPATHY   Type 2 diabetes mellitus with circulatory disorder, 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[place]) 01/17/2017    Past Surgical History:  Procedure Laterality Date   ABLATION  06/2017   ATRIAL FIBRILLATION ABLATION N/A 05/21/2020   Procedure: ATRIAL FIBRILLATION ABLATION;  Surgeon: Constance Haw, MD;  Location: [place] [facility];   Service: Cardiovascular;  Laterality: N/A;   CARDIOVERSION N/A 03/22/2018   Procedure: CARDIOVERSION;  Surgeon: Buford Dresser, MD;  Location: [facility] ENDOSCOPY;  Service: Cardiovascular;  Laterality: N/A;   CARDIOVERSION N/A 07/08/2020   Procedure: CARDIOVERSION;  Surgeon: Jerline Pain, MD;  Location: [facility] ENDOSCOPY;  Service: Cardiovascular;  Laterality: N/A;   HEMIARTHROPLASTY HIP  10/31/2013   IT HIP BIPOLAR   LUMBAR SPINE SURGERY     PACEMAKER PLACEMENT  01/29/2017   SHOULDER SURGERY Right     Current Medications: Current Meds  Medication Sig   amiodarone (PACERONE) 200 MG tablet TAKE ONE TABLET BY MOUTH EVERY DAY (Patient taking differently: Take 200 mg by mouth daily.)   atorvastatin (LIPITOR) 10 MG tablet TAKE ONE TABLET BY MOUTH EVERY EVENING (Patient taking differently: Take 10 mg by mouth daily.)   carvedilol (COREG) 12.5 MG tablet Take 1 tablet (12.5 mg total) by mouth 2 (two) times daily.   FARXIGA 10 MG TABS tablet TAKE ONE TABLET BY MOUTH EVERY DAY (Patient taking differently: Take 10 mg by mouth daily.)   gabapentin (NEURONTIN) 300 MG capsule TAKE ONE CAPSULE BY MOUTH 3 TIMES DAILY (Patient taking differently: Take 300 mg by mouth 3 (three) times daily.)   glipiZIDE (GLUCOTROL XL) 10 MG 24 hr tablet TAKE ONE TABLET BY MOUTH TWICE DAILY (Patient taking differently: Take 10 mg by mouth 2 (two) times daily.)   levothyroxine (SYNTHROID) 137 MCG tablet TAKE ONE TABLET BY MOUTH EVERY MORNING (Patient taking differently: Take 137 mcg by mouth daily before breakfast.)   potassium chloride (KLOR-CON) 10 MEQ tablet Take 10 mEq by mouth daily.   silver sulfADIAZINE (SILVADENE) 1 % cream Apply 1 application topically daily.   testosterone cypionate (DEPOTESTOSTERONE CYPIONATE) 200 MG/ML injection INJECT 1 MILLILITER IN THE MUSCLE EVERY 2 WEEKS (Patient taking differently: Inject 100 mg into the muscle every 14 (fourteen) days. INJECT 1 MILLILITER IN THE MUSCLE EVERY 2 WEEKS)   XARELTO  20 MG TABS tablet TAKE ONE TABLET BY MOUTH EVERY EVENING (Patient taking differently: Take 20 mg by mouth daily with supper.)   zolpidem (AMBIEN) 10 MG tablet Take 1 tablet (10 mg total) by mouth at bedtime as needed for sleep.   Current Facility-Administered Medications for the 12/23/20 encounter (Office Visit) with Park Liter, MD  Medication   triamcinolone acetonide (KENALOG-40) injection 20 mg     Allergies:   Propranolol, Clonidine derivatives, Hydralazine hcl, Invokana [canagliflozin], Metformin and related, and Topamax [topiramate]   Social History   Socioeconomic History   Marital status: Married    Spouse name: [name]   Number of children: 3   Years of education: Not on file   Highest education level: Not on file  Occupational History   Occupation: RETIRED    Comment: school principal  Tobacco Use   Smoking status: Never   Smokeless tobacco: Never  Vaping Use  Vaping Use: Never used  Substance and Sexual Activity   Alcohol use: Not Currently   Drug use: Never   Sexual activity: Not on file  Other Topics Concern   Not on file  Social History Narrative   Lives with wife   Social Determinants of Health   Financial Resource Strain: Not on file  Food Insecurity: Not on file  Transportation Needs: Not on file  Physical Activity: Not on file  Stress: Not on file  Social Connections: Not on file     Family History: The patient's family history includes Arthritis in an other family member; Congestive Heart Failure in an other family member; Diabetes type II in an other family member; Heart Problems in his father; Heart attack in his mother; Hyperlipidemia in an other family member; Transient ischemic attack in his mother; Tuberculosis in his brother. ROS:   Please see the history of present illness.    All 14 point review of systems negative except as described per history of present illness  EKGs/Labs/Other Studies Reviewed:      Recent Labs: 09/30/2020:  NT-Pro BNP 1,483 10/21/2020: ALT 29; BUN 28; Creatinine, Ser 1.99; Hemoglobin 15.5; Platelets 206; Potassium 4.9; Sodium 140; TSH 3.640  Recent Lipid Panel    Component Value Date/Time   CHOL 117 10/21/2020 0828   TRIG 86 10/21/2020 0828   HDL 38 (L) 10/21/2020 0828   CHOLHDL 3.1 10/21/2020 0828   LDLCALC 62 10/21/2020 0828    Physical Exam:    VS:  BP 112/60 (BP Location: Left Arm, Patient Position: Sitting)   Pulse 60   Ht 6\' 3"  (1.905 m)   Wt 233 lb 9.6 oz (106 kg)   BMI 29.20 kg/m     Wt Readings from Last 3 Encounters:  12/23/20 233 lb 9.6 oz (106 kg)  12/13/20 234 lb (106.1 kg)  11/16/20 233 lb 9.6 oz (106 kg)     GEN:  Well nourished, well developed in no acute distress HEENT: Normal NECK: No JVD; No carotid bruits LYMPHATICS: No lymphadenopathy CARDIAC: RRR, no murmurs, no rubs, no gallops RESPIRATORY:  Clear to auscultation without rales, wheezing or rhonchi  ABDOMEN: Soft, non-tender, non-distended MUSCULOSKELETAL:  No edema; No deformity  SKIN: Warm and dry LOWER EXTREMITIES: no swelling NEUROLOGIC:  Alert and oriented x 3 PSYCHIATRIC:  Normal affect   ASSESSMENT:    1. Paroxysmal atrial fibrillation (HCC)   2. Chronic diastolic congestive heart failure ([place])   3. Diabetes mellitus due to underlying condition with unspecified complications ([place])   4. Bradycardia   5. Status post ablation of atrial fibrillation   6. Cardiac pacemaker in situ    PLAN:    In order of problems listed above:  Paroxysmal atrial fibrillation on amiodarone to her milligrams daily still some breakthrough arrhythmias.  However burden overall is less than per 10%.  He had discussion about potentially doing another ablation he does not want to do it.  We will continue amiodarone the present dose his thyroid function is normal, we will continue with anticoagulation with Xarelto. Chronic diastolic congestive heart failure appears to be compensated continue present management. Type 2  diabetes that being followed by antimedicine team.  I do have his hemoglobin A1c from May 50 which is 9.8.  I told him this is not sufficiently controlled he need to do better job and control his diabetes. Dyslipidemia I did review his K PN which show me data from 10/21/2020 with LDL of 62 HDL 38 this is  a good cholesterol profile he is on Lipitor 10 which I will continue. Overall we talked at length about what to do with the situation today his EKG demonstrated sinus rhythm.  The plan is if he will became more frequently tired and exhausted we will try to correlate his symptoms with EKG/atrial fibrillation.  I asked him to have a log of these days when he feels lousy we will check pacemaker interrogation after that and see if there is any correlation between fetal healing pad and atrial fibrillation.   Medication Adjustments/Labs and Tests Ordered: Current medicines are reviewed at length with the patient today.  Concerns regarding medicines are outlined above.  No orders of the defined types were placed in this encounter.  Medication changes: No orders of the defined types were placed in this encounter.   Signed, Park Liter, MD, [facility] 12/23/2020 8:50 AM    [place]

## 2020-12-23 NOTE — Patient Instructions (Signed)
Medication Instructions:  Your physician recommends that you continue on your current medications as directed. Please refer to the Current Medication list given to you today.  *If you need a refill on your cardiac medications before your next appointment, please call your pharmacy*   Lab Work: None ordered If you have labs (blood work) drawn today and your tests are completely normal, you will receive your results only by: MyChart Message (if you have MyChart) OR A paper copy in the mail If you have any lab test that is abnormal or we need to change your treatment, we will call you to review the results.   Testing/Procedures: None ordered   Follow-Up: At [facility], you and your health needs are our priority.  As part of our continuing mission to provide you with exceptional heart care, we have created designated Provider Care Teams.  These Care Teams include your primary Cardiologist (physician) and Advanced Practice Providers (APPs -  Physician Assistants and Nurse Practitioners) who all work together to provide you with the care you need, when you need it.  We recommend signing up for the patient portal called "MyChart".  Sign up information is provided on this After Visit Summary.  MyChart is used to connect with patients for Virtual Visits (Telemedicine).  Patients are able to view lab/test results, encounter notes, upcoming appointments, etc.  Non-urgent messages can be sent to your provider as well.   To learn more about what you can do with MyChart, go to [email or web address].    Your next appointment:   4 month(s)  The format for your next appointment:   In Person  Provider:   Robert Krasowski, MD   Other Instructions NA  

## 2020-12-28 NOTE — Progress Notes (Signed)
Remote pacemaker transmission.   

## 2021-01-12 DIAGNOSIS — E113293 Type 2 diabetes mellitus with mild nonproliferative diabetic retinopathy without macular edema, bilateral: Secondary | ICD-10-CM | POA: Diagnosis not present

## 2021-01-14 DIAGNOSIS — Z8551 Personal history of malignant neoplasm of bladder: Secondary | ICD-10-CM | POA: Diagnosis not present

## 2021-01-14 DIAGNOSIS — N401 Enlarged prostate with lower urinary tract symptoms: Secondary | ICD-10-CM | POA: Diagnosis not present

## 2021-01-14 DIAGNOSIS — R972 Elevated prostate specific antigen [PSA]: Secondary | ICD-10-CM | POA: Diagnosis not present

## 2021-01-17 DIAGNOSIS — Z8551 Personal history of malignant neoplasm of bladder: Secondary | ICD-10-CM | POA: Diagnosis not present

## 2021-01-20 ENCOUNTER — Other Ambulatory Visit: Payer: Self-pay

## 2021-01-20 ENCOUNTER — Encounter: Payer: Self-pay | Admitting: Family Medicine

## 2021-01-20 ENCOUNTER — Ambulatory Visit (INDEPENDENT_AMBULATORY_CARE_PROVIDER_SITE_OTHER): Payer: Medicare Other | Admitting: Family Medicine

## 2021-01-20 VITALS — BP 110/64 | HR 84 | Temp 97.4°F | Resp 16 | Ht 75.0 in | Wt 237.0 lb

## 2021-01-20 DIAGNOSIS — E782 Mixed hyperlipidemia: Secondary | ICD-10-CM | POA: Diagnosis not present

## 2021-01-20 DIAGNOSIS — G4733 Obstructive sleep apnea (adult) (pediatric): Secondary | ICD-10-CM

## 2021-01-20 DIAGNOSIS — M7061 Trochanteric bursitis, right hip: Secondary | ICD-10-CM | POA: Diagnosis not present

## 2021-01-20 DIAGNOSIS — Z9989 Dependence on other enabling machines and devices: Secondary | ICD-10-CM | POA: Diagnosis not present

## 2021-01-20 DIAGNOSIS — B351 Tinea unguium: Secondary | ICD-10-CM | POA: Diagnosis not present

## 2021-01-20 DIAGNOSIS — Z6829 Body mass index (BMI) 29.0-29.9, adult: Secondary | ICD-10-CM | POA: Diagnosis not present

## 2021-01-20 DIAGNOSIS — I482 Chronic atrial fibrillation, unspecified: Secondary | ICD-10-CM

## 2021-01-20 DIAGNOSIS — E1142 Type 2 diabetes mellitus with diabetic polyneuropathy: Secondary | ICD-10-CM

## 2021-01-20 DIAGNOSIS — N1832 Chronic kidney disease, stage 3b: Secondary | ICD-10-CM | POA: Diagnosis not present

## 2021-01-20 DIAGNOSIS — K219 Gastro-esophageal reflux disease without esophagitis: Secondary | ICD-10-CM

## 2021-01-20 LAB — POCT UA - MICROALBUMIN: Microalbumin Ur, POC: 80 mg/L

## 2021-01-20 MED ORDER — FAMOTIDINE 40 MG PO TABS: 40.0000 mg | ORAL_TABLET | Freq: Every day | ORAL | 0 refills | Status: DC

## 2021-01-20 NOTE — Progress Notes (Addendum)
Subjective:  Patient ID: Paul Kent, male    DOB: May 21, 1941  Age: 79 y.o. MRN: [id number]  Chief Complaint  Patient presents with   Atrial Fibrillation   Diabetes    HPI Diabetes:  Checks sugars once daily in am. 115-300. Normally it is 110-140. On farxiga 10 mg once daily, glipizide 10 mg one twice a day. ON gabapentin for neuropathy. Pt has no pain in his feet, but are totally numb. Atrial fibrillation: xarelto and amiodarone. ON carvedilol.  Hypothyroidism: on synthroid 137 mcg once daily in am.   GERD: otc prilosec helped. Has not taken any other types of medicines. Has a distant history of H. Pylori. Requesting rx for prilosec. Dr. Gaylyn Cheers recommended nasal surgery,. Pt will have to come off xarelto. Pt is constantly congested.  Pt is on cpap with OSA. Pt is with [facility]. Needs a new machine because it was recalled and has been greater than 5 yrs since first sleep study.  Pt is having night sweats. Sporadic. 4 times a year.  Pt would like to discuss toenail removal. Rt great toenail is loose. Pt has no sensation in his feet.  Rt hip and leg hurting x 2-3 weeks. Aches with exercising. Hurts in right lower back.   Current Outpatient Medications on File Prior to Visit  Medication Sig Dispense Refill   amiodarone (PACERONE) 200 MG tablet TAKE ONE TABLET BY MOUTH EVERY DAY (Patient taking differently: Take 200 mg by mouth daily.) 90 tablet 1   atorvastatin (LIPITOR) 10 MG tablet TAKE ONE TABLET BY MOUTH EVERY EVENING (Patient taking differently: Take 10 mg by mouth daily.) 90 tablet 2   carvedilol (COREG) 12.5 MG tablet Take 1 tablet (12.5 mg total) by mouth 2 (two) times daily. 180 tablet 1   FARXIGA 10 MG TABS tablet TAKE ONE TABLET BY MOUTH EVERY DAY (Patient taking differently: Take 10 mg by mouth daily.) 30 tablet 2   gabapentin (NEURONTIN) 300 MG capsule TAKE ONE CAPSULE BY MOUTH 3 TIMES DAILY (Patient taking differently: Take 300 mg by mouth 3 (three) times  daily.) 90 capsule 5   glipiZIDE (GLUCOTROL XL) 10 MG 24 hr tablet TAKE ONE TABLET BY MOUTH TWICE DAILY (Patient taking differently: Take 10 mg by mouth 2 (two) times daily.) 180 tablet 1   levothyroxine (SYNTHROID) 137 MCG tablet TAKE ONE TABLET BY MOUTH EVERY MORNING (Patient taking differently: Take 137 mcg by mouth daily before breakfast.) 90 tablet 1   potassium chloride (KLOR-CON) 10 MEQ tablet Take 10 mEq by mouth daily.     silver sulfADIAZINE (SILVADENE) 1 % cream Apply 1 application topically daily. 50 g 0   testosterone cypionate (DEPOTESTOSTERONE CYPIONATE) 200 MG/ML injection INJECT 1 MILLILITER IN THE MUSCLE EVERY 2 WEEKS (Patient taking differently: Inject 100 mg into the muscle every 14 (fourteen) days. INJECT 1 MILLILITER IN THE MUSCLE EVERY 2 WEEKS) 10 mL 1   XARELTO 20 MG TABS tablet TAKE ONE TABLET BY MOUTH EVERY EVENING (Patient taking differently: Take 20 mg by mouth daily with supper.) 90 tablet 1   zolpidem (AMBIEN) 10 MG tablet Take 1 tablet (10 mg total) by mouth at bedtime as needed for sleep. 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   Cellulitis of right lower limb    CHF (congestive heart failure) ([place]) 02/2018   Chronic atrial fibrillation (HCC)    Corns and callosities    Deviated septum 07/01/2018   Diabetes mellitus due to underlying condition with unspecified complications ([place]) [id number]   Disturbances of salivary secretion    Epistaxis    Essential hypertension    Essential tremor     H pylori ulcer    Herpes zoster with nervous system complication [id number]   Hesitancy of micturition    Hyperlipidemia    Hypothyroidism    Impacted cerumen, bilateral    Jaw pain    Lesion of femoral nerve 04/30/2013   Localized edema    Male erectile disorder    Malignant neoplasm of overlapping sites of bladder ([place])    Nasal congestion 07/01/2018   Nasal turbinate hypertrophy 07/01/2018   Obstructive sleep apnea 01/17/2017   Other constipation    Other fatigue    Overweight    Pacemaker reprogramming/check 02/12/2017   Pain in right finger(s)    Pain in right lower leg    Paroxysmal atrial fibrillation ([place]) 06/08/2016   Peptic ulcer disease    Persistent atrial fibrillation ([place]) 06/01/2017   Primary insomnia    Renal stones    Renal stones    Secondary hypercoagulable state ([place]) 06/17/2020   Shingles [id number]   WITH COMPLICATIONS (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)   Shingles 06/2012   Shortness of breath    Sick sinus syndrome ([place])    Sinus bradycardia 03/07/2017   Sleep apnea    dx 08/2015, CPAP   Spontaneous ecchymoses    Squamous cell carcinoma of skin    UNSPECIFIED   Status post ablation of atrial fibrillation 09/16/2020   Testicular hypofunction    Thoracic or lumbosacral neuritis or radiculitis 06/03/2013   Trigger middle finger of right hand 11/28/2019   Type 2 diabetes mellitus ([place]) 1994   UNCONTROLLED, COMPLICATIONS INCLUDE NEPHROPATHY AND PERIPHERAL NEUROPATHY   Type 2 diabetes mellitus with circulatory disorder, 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[place]) 01/17/2017   Past Surgical History:  Procedure Laterality Date   ABLATION  06/2017   ATRIAL FIBRILLATION ABLATION N/A 05/21/2020   Procedure: ATRIAL FIBRILLATION ABLATION;  Surgeon: Constance Haw, MD;  Location: [facility];  Service: Cardiovascular;  Laterality: N/A;   CARDIOVERSION N/A 03/22/2018   Procedure: CARDIOVERSION;  Surgeon: Buford Dresser,  MD;  Location: [facility] ENDOSCOPY;  Service: Cardiovascular;  Laterality: N/A;   CARDIOVERSION N/A 07/08/2020   Procedure: CARDIOVERSION;  Surgeon: Jerline Pain, MD;  Location: [facility] ENDOSCOPY;  Service: Cardiovascular;  Laterality: N/A;   HEMIARTHROPLASTY HIP  10/31/2013   IT HIP BIPOLAR   LUMBAR SPINE SURGERY     PACEMAKER PLACEMENT  01/29/2017   SHOULDER SURGERY Right     Family History  Problem Relation Age of Onset   Heart attack Mother    Transient ischemic attack Mother    Heart Problems Father    Tuberculosis Brother    Diabetes type II Other    Hyperlipidemia Other    Congestive Heart Failure Other    Arthritis Other    Social History   Socioeconomic History  Marital status: Married    Spouse name: [name]   Number of children: 3   Years of education: Not on file   Highest education level: Not on file  Occupational History   Occupation: RETIRED    Comment: school principal  Tobacco Use   Smoking status: Never   Smokeless tobacco: Never  Vaping Use   Vaping Use: Never used  Substance and Sexual Activity   Alcohol use: Not Currently   Drug use: Never   Sexual activity: Not on file  Other Topics Concern   Not on file  Social History Narrative   Lives with wife   Social Determinants of Health   Financial Resource Strain: Not on file  Food Insecurity: Not on file  Transportation Needs: Not on file  Physical Activity: Not on file  Stress: Not on file  Social Connections: Not on file    Review of Systems  Constitutional:  Positive for fatigue. Negative for chills and fever.  HENT:  Negative for congestion, rhinorrhea and sore throat.   Respiratory:  Negative for cough and shortness of breath.   Cardiovascular:  Negative for chest pain and palpitations.  Gastrointestinal:  Positive for abdominal pain (improved with Prilosec OTC). Negative for constipation, diarrhea, nausea and vomiting.  Endocrine: Positive for polydipsia. Negative for polyphagia and polyuria.   Genitourinary:  Negative for dysuria and urgency.  Musculoskeletal:  Positive for arthralgias (right hip and leg pain). Negative for back pain and myalgias.  Neurological:  Positive for numbness. Negative for dizziness and headaches.  Psychiatric/Behavioral:  Negative for dysphoric mood. The patient is not nervous/anxious.     Objective:  BP 110/64   Pulse 84   Temp (!) 97.4 F (36.3 C)   Resp 16   Ht '6\' 3"'$  (1.905 m)   Wt 237 lb (107.5 kg)   BMI 29.62 kg/m   BP/Weight 01/27/2021 [id number]  Systolic BP [id number]  Diastolic BP 72 64 60  Wt. (Lbs) 237 237 233.6  BMI 29.62 29.62 29.2    Physical Exam Vitals reviewed.  Constitutional:      Appearance: Normal appearance. He is obese.  Neck:     Vascular: No carotid bruit.  Cardiovascular:     Rate and Rhythm: Normal rate. Rhythm irregular.     Pulses: Normal pulses.     Heart sounds: Normal heart sounds.  Pulmonary:     Effort: Pulmonary effort is normal.     Breath sounds: Normal breath sounds. No wheezing, rhonchi or rales.  Abdominal:     General: Bowel sounds are normal.     Palpations: Abdomen is soft.     Tenderness: There is no abdominal tenderness.  Musculoskeletal:        General: Tenderness (rt trochanteric bursa.) present.  Neurological:     Mental Status: He is alert.  Psychiatric:        Mood and Affect: Mood normal.        Behavior: Behavior normal.    Diabetic Foot Exam - Simple   Simple Foot Form  01/20/2021  8:12 PM  Visual Inspection No deformities, no ulcerations, no other skin breakdown bilaterally: Yes Sensation Testing See comments: Yes Pulse Check Posterior Tibialis and Dorsalis pulse intact bilaterally: Yes Comments BL feet fully numb. Onychomycosis.  Rt great toenail loose. Not tender.      Lab Results  Component Value Date   WBC 4.4 01/20/2021   HGB 16.9 01/20/2021   HCT 51.9 (H) 01/20/2021   PLT 137 (  L) 01/20/2021   GLUCOSE 108 (H) 01/20/2021   CHOL 118 01/20/2021    TRIG 70 01/20/2021   HDL 37 (L) 01/20/2021   LDLCALC 66 01/20/2021   ALT 15 01/20/2021   AST 19 01/20/2021   NA 141 01/20/2021   K 4.5 01/20/2021   CL 105 01/20/2021   CREATININE 1.70 (H) 01/20/2021   BUN 20 01/20/2021   CO2 25 01/20/2021   TSH 3.640 10/21/2020   HGBA1C 8.1 (H) 01/20/2021   MICROALBUR 80 01/20/2021      Assessment & Plan:   1. Chronic atrial fibrillation (HCC) The current medical regimen is effective;  continue present plan and medications.  2. Mixed hyperlipidemia Well controlled.  No changes to medicines.  Continue to work on eating a healthy diet and exercise.  Labs drawn today.  - CBC with Differential/Platelet - Comprehensive metabolic panel - Lipid panel  3. Diabetic polyneuropathy associated with type 2 diabetes mellitus ([place]) Control: improved, but not controlled. Recommend check sugars fasting daily. Recommend check feet daily. Recommend annual eye exams. Medicines: Recommend start ozempic 0.25 mg once weekly.  Continue to work on eating a healthy diet and exercise.  Labs drawn today.   Night sweats: Patient to check his sugars when this occurs to determine if it is hypoglycemia. - POCT UA - Microalbumin - Hemoglobin A1c  4. Chronic kidney disease, stage 3b (HCC) Stable.  5. Gastroesophageal reflux disease without esophagitis Pepcid 40 mg once daily. If not helping and I will send rx for omeprazole   6. Trochanteric bursitis of right hip Right hip pain likely trochanteric bursitis.  Recommend Voltaren gel 4 times a day OTC.  If not improving will have patient return for steroid injection.  Also use Tylenol.  7. Onychomycosis: rt toenail lifting up.  Return for removal.   8. OSA on CPAP Home sleep study ordered.    Needs a new CPAP machine.  9. BMI 29.0-29.9,adult  Recommend continue to work on eating healthy diet and exercise.  Meds ordered this encounter  Medications   famotidine (PEPCID) 40 MG tablet    Sig: Take 1  tablet (40 mg total) by mouth daily.    Dispense:  90 tablet    Refill:  0    Orders Placed This Encounter  Procedures   CBC with Differential/Platelet   Hemoglobin A1c   Comprehensive metabolic panel   Lipid panel   Cardiovascular Risk Assessment   POCT UA - Microalbumin   Home sleep test     Follow-up: Return in about 3 months (around 04/22/2021) for Fasting.  An After Visit Summary was printed and given to the patient.  [name], MD [facility] [phone]

## 2021-01-20 NOTE — Patient Instructions (Addendum)
Reflux: Pepcid 40 mg once daily. If not helping and I will send rx for omeprazole  Right hip pain likely trochanteric bursitis.  Recommend Voltaren gel 4 times a day OTC.  If not improving will have patient return for steroid injection.  Also use Tylenol.  Night sweats: Patient to check his sugars when this occurs to determine if it is hypoglycemia.  OSA: Ordering a new sleep study.  Needs a new CPAP machine.  Abnormal toenail: Patient to return for removal.

## 2021-01-21 LAB — HEMOGLOBIN A1C
Est. average glucose Bld gHb Est-mCnc: 186 mg/dL
Hgb A1c MFr Bld: 8.1 % — ABNORMAL HIGH (ref 4.8–5.6)

## 2021-01-21 LAB — COMPREHENSIVE METABOLIC PANEL
ALT: 15 IU/L (ref 0–44)
AST: 19 IU/L (ref 0–40)
Albumin/Globulin Ratio: 1.9 (ref 1.2–2.2)
Albumin: 4.2 g/dL (ref 3.6–4.6)
Alkaline Phosphatase: 98 IU/L (ref 44–121)
BUN/Creatinine Ratio: 12 (ref 10–24)
BUN: 20 mg/dL (ref 8–27)
Bilirubin Total: 0.7 mg/dL (ref 0.0–1.2)
CO2: 25 mmol/L (ref 20–29)
Calcium: 8.7 mg/dL (ref 8.6–10.2)
Chloride: 105 mmol/L (ref 96–106)
Creatinine, Ser: 1.7 mg/dL — ABNORMAL HIGH (ref 0.76–1.27)
Globulin, Total: 2.2 g/dL (ref 1.5–4.5)
Glucose: 108 mg/dL — ABNORMAL HIGH (ref 65–99)
Potassium: 4.5 mmol/L (ref 3.5–5.2)
Sodium: 141 mmol/L (ref 134–144)
Total Protein: 6.4 g/dL (ref 6.0–8.5)
eGFR: 40 mL/min/{1.73_m2} — ABNORMAL LOW (ref >59)

## 2021-01-21 LAB — LIPID PANEL
Chol/HDL Ratio: 3.2 ratio (ref 0.0–5.0)
Cholesterol, Total: 118 mg/dL (ref 100–199)
HDL: 37 mg/dL — ABNORMAL LOW (ref >39)
LDL Chol Calc (NIH): 66 mg/dL (ref 0–99)
Triglycerides: 70 mg/dL (ref 0–149)
VLDL Cholesterol Cal: 15 mg/dL (ref 5–40)

## 2021-01-21 LAB — CBC WITH DIFFERENTIAL/PLATELET
Basophils Absolute: 0.1 10*3/uL (ref 0.0–0.2)
Basos: 2 %
EOS (ABSOLUTE): 0.1 10*3/uL (ref 0.0–0.4)
Eos: 3 %
Hematocrit: 51.9 % — ABNORMAL HIGH (ref 37.5–51.0)
Hemoglobin: 16.9 g/dL (ref 13.0–17.7)
Immature Grans (Abs): 0 10*3/uL (ref 0.0–0.1)
Immature Granulocytes: 0 %
Lymphocytes Absolute: 1.2 10*3/uL (ref 0.7–3.1)
Lymphs: 27 %
MCH: 32.1 pg (ref 26.6–33.0)
MCHC: 32.6 g/dL (ref 31.5–35.7)
MCV: 99 fL — ABNORMAL HIGH (ref 79–97)
Monocytes Absolute: 0.6 10*3/uL (ref 0.1–0.9)
Monocytes: 13 %
Neutrophils Absolute: 2.4 10*3/uL (ref 1.4–7.0)
Neutrophils: 55 %
Platelets: 137 10*3/uL — ABNORMAL LOW (ref 150–450)
RBC: 5.27 x10E6/uL (ref 4.14–5.80)
RDW: 12.9 % (ref 11.6–15.4)
WBC: 4.4 10*3/uL (ref 3.4–10.8)

## 2021-01-21 LAB — CARDIOVASCULAR RISK ASSESSMENT

## 2021-01-24 ENCOUNTER — Other Ambulatory Visit: Payer: Self-pay

## 2021-01-26 ENCOUNTER — Telehealth: Payer: Self-pay | Admitting: Family Medicine

## 2021-01-26 NOTE — Chronic Care Management (AMB) (Signed)
  Chronic Care Management   Outreach Note  01/26/2021 Name: Paul Kent MRN: [id number] DOB: [birth year]  Referred by: [name], MD Reason for referral : No chief complaint on file.   An unsuccessful telephone outreach was attempted today. The patient was referred to the pharmacist for assistance with care management and care coordination.   Follow Up Plan:  LEFT VM TO SEE IF PT WANTS TO R/S REFERRAL APT  Tatjana Dellinger [employer] Scheduler

## 2021-01-27 ENCOUNTER — Encounter: Payer: Self-pay | Admitting: Family Medicine

## 2021-01-27 ENCOUNTER — Other Ambulatory Visit: Payer: Self-pay

## 2021-01-27 ENCOUNTER — Ambulatory Visit (INDEPENDENT_AMBULATORY_CARE_PROVIDER_SITE_OTHER): Payer: Medicare Other | Admitting: Family Medicine

## 2021-01-27 VITALS — BP 130/72 | HR 100 | Temp 97.4°F | Resp 18 | Ht 75.0 in | Wt 237.0 lb

## 2021-01-27 DIAGNOSIS — G4739 Other sleep apnea: Secondary | ICD-10-CM

## 2021-01-29 ENCOUNTER — Encounter: Payer: Self-pay | Admitting: Family Medicine

## 2021-02-02 ENCOUNTER — Other Ambulatory Visit: Payer: Self-pay

## 2021-02-02 MED ORDER — LEVOTHYROXINE SODIUM 137 MCG PO TABS: 137.0000 ug | ORAL_TABLET | Freq: Every morning | ORAL | 3 refills | Status: DC

## 2021-02-17 DIAGNOSIS — R0602 Shortness of breath: Secondary | ICD-10-CM | POA: Diagnosis not present

## 2021-02-17 DIAGNOSIS — G4733 Obstructive sleep apnea (adult) (pediatric): Secondary | ICD-10-CM | POA: Diagnosis not present

## 2021-02-18 DIAGNOSIS — R0602 Shortness of breath: Secondary | ICD-10-CM | POA: Diagnosis not present

## 2021-02-18 DIAGNOSIS — G4733 Obstructive sleep apnea (adult) (pediatric): Secondary | ICD-10-CM | POA: Diagnosis not present

## 2021-02-22 ENCOUNTER — Other Ambulatory Visit: Payer: Self-pay

## 2021-02-22 MED ORDER — CARVEDILOL 12.5 MG PO TABS: 12.5000 mg | ORAL_TABLET | Freq: Two times a day (BID) | ORAL | 1 refills | Status: DC

## 2021-02-26 DIAGNOSIS — Z23 Encounter for immunization: Secondary | ICD-10-CM | POA: Diagnosis not present

## 2021-03-07 DIAGNOSIS — H6123 Impacted cerumen, bilateral: Secondary | ICD-10-CM | POA: Diagnosis not present

## 2021-03-07 DIAGNOSIS — H61303 Acquired stenosis of external ear canal, unspecified, bilateral: Secondary | ICD-10-CM | POA: Diagnosis not present

## 2021-03-07 DIAGNOSIS — H9190 Unspecified hearing loss, unspecified ear: Secondary | ICD-10-CM | POA: Diagnosis not present

## 2021-03-10 ENCOUNTER — Ambulatory Visit (INDEPENDENT_AMBULATORY_CARE_PROVIDER_SITE_OTHER): Payer: Medicare Other

## 2021-03-10 ENCOUNTER — Telehealth: Payer: Self-pay

## 2021-03-10 DIAGNOSIS — I480 Paroxysmal atrial fibrillation: Secondary | ICD-10-CM | POA: Diagnosis not present

## 2021-03-10 LAB — CUP PACEART REMOTE DEVICE CHECK
Date Time Interrogation Session: 20220922122306
Implantable Lead Implant Date: 20180813
Implantable Lead Implant Date: 20180813
Implantable Lead Location: 753859
Implantable Lead Location: 753860
Implantable Lead Model: 377
Implantable Lead Model: 377
Implantable Lead Serial Number: 49893169
Implantable Lead Serial Number: 50011411
Implantable Pulse Generator Implant Date: 20180813
Pulse Gen Model: 407145
Pulse Gen Serial Number: 69158272

## 2021-03-10 NOTE — Telephone Encounter (Signed)
[facility] alert received for long/ on-going AF episode-  AF appears to have started 03/08/21 ~ 1530, ongoing at time of transmission 03/10/21 0152.    Pt has known Persistent AF, has undergone 3 ablations.  At last office visit 12/13/20, pt offered repeat ablation to which he was to notify our office if he would like to do.    Current meds include: Carvedilol 12.5mg  BID, Amiodarone 200mg  daily, Xarelto 20mg  every evening.    Spoke with pt, he reports that he was symptomatic yesterday, feeling lousy most of the day, however today he feels great, he is on his way to the gym currently.    Pt does not wish to make any changes currently.  Advised patient that we will continue to monitor, he should notify us if he is symptomatic.

## 2021-03-16 NOTE — Progress Notes (Signed)
Remote pacemaker transmission.   

## 2021-03-19 ENCOUNTER — Other Ambulatory Visit: Payer: Self-pay | Admitting: Family Medicine

## 2021-03-19 DIAGNOSIS — E08621 Diabetes mellitus due to underlying condition with foot ulcer: Secondary | ICD-10-CM

## 2021-03-19 DIAGNOSIS — L97411 Non-pressure chronic ulcer of right heel and midfoot limited to breakdown of skin: Secondary | ICD-10-CM

## 2021-03-21 NOTE — Telephone Encounter (Signed)
Refill sent to pharmacy.   

## 2021-03-25 ENCOUNTER — Telehealth: Payer: Medicare Other

## 2021-04-06 ENCOUNTER — Ambulatory Visit (INDEPENDENT_AMBULATORY_CARE_PROVIDER_SITE_OTHER): Payer: Medicare Other | Admitting: Nurse Practitioner

## 2021-04-06 ENCOUNTER — Encounter: Payer: Self-pay | Admitting: Nurse Practitioner

## 2021-04-06 ENCOUNTER — Other Ambulatory Visit: Payer: Self-pay | Admitting: Family Medicine

## 2021-04-06 VITALS — BP 110/60 | HR 59 | Temp 97.4°F | Ht 75.0 in | Wt 248.0 lb

## 2021-04-06 DIAGNOSIS — D696 Thrombocytopenia, unspecified: Secondary | ICD-10-CM | POA: Diagnosis not present

## 2021-04-06 DIAGNOSIS — I130 Hypertensive heart and chronic kidney disease with heart failure and stage 1 through stage 4 chronic kidney disease, or unspecified chronic kidney disease: Secondary | ICD-10-CM | POA: Diagnosis not present

## 2021-04-06 DIAGNOSIS — I509 Heart failure, unspecified: Secondary | ICD-10-CM | POA: Diagnosis not present

## 2021-04-06 DIAGNOSIS — R300 Dysuria: Secondary | ICD-10-CM | POA: Diagnosis not present

## 2021-04-06 DIAGNOSIS — R0602 Shortness of breath: Secondary | ICD-10-CM | POA: Diagnosis not present

## 2021-04-06 DIAGNOSIS — Z8551 Personal history of malignant neoplasm of bladder: Secondary | ICD-10-CM | POA: Diagnosis not present

## 2021-04-06 DIAGNOSIS — R319 Hematuria, unspecified: Secondary | ICD-10-CM | POA: Diagnosis not present

## 2021-04-06 DIAGNOSIS — N3001 Acute cystitis with hematuria: Secondary | ICD-10-CM | POA: Diagnosis not present

## 2021-04-06 DIAGNOSIS — I482 Chronic atrial fibrillation, unspecified: Secondary | ICD-10-CM | POA: Diagnosis not present

## 2021-04-06 DIAGNOSIS — N200 Calculus of kidney: Secondary | ICD-10-CM | POA: Diagnosis not present

## 2021-04-06 DIAGNOSIS — Z79899 Other long term (current) drug therapy: Secondary | ICD-10-CM | POA: Diagnosis not present

## 2021-04-06 DIAGNOSIS — N183 Chronic kidney disease, stage 3 unspecified: Secondary | ICD-10-CM | POA: Diagnosis not present

## 2021-04-06 DIAGNOSIS — E039 Hypothyroidism, unspecified: Secondary | ICD-10-CM | POA: Diagnosis not present

## 2021-04-06 DIAGNOSIS — I5032 Chronic diastolic (congestive) heart failure: Secondary | ICD-10-CM

## 2021-04-06 DIAGNOSIS — R509 Fever, unspecified: Secondary | ICD-10-CM | POA: Diagnosis not present

## 2021-04-06 DIAGNOSIS — K8020 Calculus of gallbladder without cholecystitis without obstruction: Secondary | ICD-10-CM | POA: Diagnosis not present

## 2021-04-06 DIAGNOSIS — N179 Acute kidney failure, unspecified: Secondary | ICD-10-CM | POA: Diagnosis not present

## 2021-04-06 DIAGNOSIS — Z20822 Contact with and (suspected) exposure to covid-19: Secondary | ICD-10-CM | POA: Diagnosis not present

## 2021-04-06 DIAGNOSIS — E1122 Type 2 diabetes mellitus with diabetic chronic kidney disease: Secondary | ICD-10-CM | POA: Diagnosis not present

## 2021-04-06 DIAGNOSIS — R1011 Right upper quadrant pain: Secondary | ICD-10-CM | POA: Diagnosis not present

## 2021-04-06 DIAGNOSIS — Z7984 Long term (current) use of oral hypoglycemic drugs: Secondary | ICD-10-CM | POA: Diagnosis not present

## 2021-04-06 DIAGNOSIS — I517 Cardiomegaly: Secondary | ICD-10-CM | POA: Diagnosis not present

## 2021-04-06 DIAGNOSIS — I110 Hypertensive heart disease with heart failure: Secondary | ICD-10-CM | POA: Diagnosis not present

## 2021-04-06 DIAGNOSIS — Z7902 Long term (current) use of antithrombotics/antiplatelets: Secondary | ICD-10-CM | POA: Diagnosis not present

## 2021-04-06 DIAGNOSIS — Z87442 Personal history of urinary calculi: Secondary | ICD-10-CM

## 2021-04-06 DIAGNOSIS — K429 Umbilical hernia without obstruction or gangrene: Secondary | ICD-10-CM | POA: Diagnosis not present

## 2021-04-06 LAB — POCT URINALYSIS DIPSTICK
Bilirubin, UA: NEGATIVE
Glucose, UA: POSITIVE — AB
Ketones, UA: NEGATIVE
Nitrite, UA: POSITIVE
Protein, UA: POSITIVE — AB
Spec Grav, UA: 1.015 (ref 1.010–1.025)
Urobilinogen, UA: 0.2 E.U./dL
pH, UA: 5.5 (ref 5.0–8.0)

## 2021-04-06 MED ORDER — SULFAMETHOXAZOLE-TRIMETHOPRIM 800-160 MG PO TABS: 1.0000 | ORAL_TABLET | Freq: Two times a day (BID) | ORAL | 0 refills | Status: AC

## 2021-04-06 NOTE — Progress Notes (Signed)
Acute Office Visit  Subjective:    Patient ID: Paul Kent, male    DOB: 25-Jul-1940, 80 y.o.   MRN: [id number]  Chief Complaint  Patient presents with   Dysuria    HPI:  Paul Kent is an 80 year old Caucasian male that presents for evaluation of dysuria, fever, and intermittent confusion. Paul Kent is accompanied by his spouse who helps supplement health history. States Paul Kent had 104 F fever yesterday, has resolved today. Onset of symptoms was a week ago. Treatment has included pushing fluids. Paul Kent has a past medical history of HF, a-fib, Type 2 diabetes, kidney stones, and bladder cancer.   Advanced directives discussed with patient and spouse. Stated they do have notarized documents in writing. Copy requested for medical record.    Patient is in today for Urinary symptoms  Paul Kent reports new onset dysuria, urinary frequency, and urinary urgency. The current episode started about a week ago and is staying constant. Patient states symptoms are 5/10 in intensity, occurring intermittently. Paul Kent  has not been recently treated for similar symptoms.  Has a hx of kidney stones.   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   Cellulitis of right lower limb    CHF (congestive heart failure) ([place]) 02/2018   Chronic atrial fibrillation ([place])    Corns and callosities    Deviated septum 07/01/2018   Diabetes mellitus due to underlying condition with unspecified complications ([place]) 40/98/1191   Disturbances of salivary secretion    Epistaxis    Essential hypertension    Essential  tremor    H pylori ulcer    Herpes zoster with nervous system complication 47/82/9562   Hesitancy of micturition    Hyperlipidemia    Hypothyroidism    Impacted cerumen, bilateral    Jaw pain    Lesion of femoral nerve 04/30/2013   Localized edema    Male erectile disorder    Malignant neoplasm of overlapping sites of bladder ([place])    Nasal congestion 07/01/2018   Nasal turbinate hypertrophy 07/01/2018   Obstructive sleep apnea 01/17/2017   Other constipation    Other fatigue    Overweight    Pacemaker reprogramming/check 02/12/2017   Pain in right finger(s)    Pain in right lower leg    Paroxysmal atrial fibrillation ([place]) 06/08/2016   Peptic ulcer disease    Persistent atrial fibrillation ([place]) 06/01/2017   Primary insomnia    Renal stones    Renal stones    Secondary hypercoagulable state ([place]) 06/17/2020   Shingles 1308   WITH COMPLICATIONS (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)   Shingles 06/2012   Shortness of breath    Sick sinus syndrome ([place])    Sinus bradycardia 03/07/2017   Sleep apnea    dx 08/2015, CPAP   Spontaneous ecchymoses    Squamous cell carcinoma of skin    UNSPECIFIED   Status post ablation of atrial fibrillation 09/16/2020   Testicular hypofunction    Thoracic or lumbosacral neuritis  or radiculitis 06/03/2013   Trigger middle finger of right hand 11/28/2019   Type 2 diabetes mellitus ([place]) 1994   UNCONTROLLED, COMPLICATIONS INCLUDE NEPHROPATHY AND PERIPHERAL NEUROPATHY   Type 2 diabetes mellitus with circulatory disorder, 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[place]) 01/17/2017    Past Surgical History:  Procedure Laterality Date   ABLATION  06/2017   ATRIAL FIBRILLATION ABLATION N/A 05/21/2020   Procedure: ATRIAL FIBRILLATION ABLATION;  Surgeon: Constance Haw, MD;  Location: [facility] LAB;  Service: Cardiovascular;  Laterality: N/A;   CARDIOVERSION N/A 03/22/2018   Procedure: CARDIOVERSION;  Surgeon: Buford Dresser, MD;  Location: [facility] ENDOSCOPY;  Service: Cardiovascular;  Laterality: N/A;   CARDIOVERSION N/A 07/08/2020   Procedure: CARDIOVERSION;  Surgeon: Jerline Pain, MD;  Location: [facility] ENDOSCOPY;  Service: Cardiovascular;  Laterality: N/A;   HEMIARTHROPLASTY HIP  10/31/2013   IT HIP BIPOLAR   LUMBAR SPINE SURGERY     PACEMAKER PLACEMENT  01/29/2017   SHOULDER SURGERY Right     Family History  Problem Relation Age of Onset   Heart attack Mother    Transient ischemic attack Mother    Heart Problems Father    Tuberculosis Brother    Diabetes type II Other    Hyperlipidemia Other    Congestive Heart Failure Other    Arthritis Other     Social History   Socioeconomic History   Marital status: Married    Spouse name: [name]   Number of children: 3   Years of education: Not on file   Highest education level: Not on file  Occupational History   Occupation: RETIRED    Comment: school principal  Tobacco Use   Smoking status: Never   Smokeless tobacco: Never  Vaping Use   Vaping Use: Never used  Substance and Sexual Activity   Alcohol use: Not Currently   Drug use: Never   Sexual activity: Not on file  Other Topics Concern   Not on file  Social History Narrative   Lives with wife   Social Determinants of Health   Financial Resource Strain: Not on file  Food Insecurity: Not on file  Transportation Needs: Not on file  Physical Activity: Not on file  Stress: Not on file  Social Connections: Not on file  Intimate Partner Violence: Not on file    Outpatient Medications Prior to Visit  Medication Sig Dispense Refill   amiodarone (PACERONE) 200 MG tablet TAKE ONE TABLET BY MOUTH EVERY DAY (Patient taking differently: Take 200 mg by mouth daily.) 90 tablet 1   atorvastatin (LIPITOR) 10 MG tablet TAKE ONE TABLET BY MOUTH EVERY EVENING (Patient taking differently: Take 10 mg by mouth daily.) 90 tablet 2   carvedilol (COREG) 12.5 MG tablet Take 1 tablet (12.5 mg total) by  mouth 2 (two) times daily. 180 tablet 1   famotidine (PEPCID) 40 MG tablet Take 1 tablet (40 mg total) by mouth daily. 90 tablet 0   FARXIGA 10 MG TABS tablet TAKE ONE TABLET BY MOUTH EVERY DAY 30 tablet 2   Finerenone 10 MG TABS Take 10 mg by mouth daily.     gabapentin (NEURONTIN) 300 MG capsule TAKE ONE CAPSULE BY MOUTH 3 TIMES DAILY (Patient taking differently: Take 300 mg by mouth 3 (three) times daily.) 90 capsule 5   glipiZIDE (GLUCOTROL XL) 10 MG 24 hr tablet TAKE ONE TABLET BY MOUTH TWICE DAILY 180 tablet 1   levothyroxine (SYNTHROID) 137 MCG tablet Take 1 tablet (137 mcg total)  by mouth every morning. 90 tablet 3   potassium chloride (KLOR-CON) 10 MEQ tablet Take 10 mEq by mouth daily.     Semaglutide (OZEMPIC, 0.25 OR 0.5 MG/DOSE, [place]) Inject 0.25 mg into the skin once a week.     silver sulfADIAZINE (SILVADENE) 1 % cream Apply 1 application topically daily. 50 g 0   testosterone cypionate (DEPOTESTOSTERONE CYPIONATE) 200 MG/ML injection INJECT 1 MILLILITER IN THE MUSCLE EVERY 2 WEEKS (Patient taking differently: Inject 100 mg into the muscle every 14 (fourteen) days. INJECT 1 MILLILITER IN THE MUSCLE EVERY 2 WEEKS) 10 mL 1   XARELTO 20 MG TABS tablet TAKE ONE TABLET BY MOUTH EVERY EVENING (Patient taking differently: Take 20 mg by mouth daily with supper.) 90 tablet 1   zolpidem (AMBIEN) 10 MG tablet Take 1 tablet (10 mg total) by mouth at bedtime as needed for sleep. 30 tablet 5   Facility-Administered Medications Prior to Visit  Medication Dose Route Frequency Provider Last Rate Last Admin   triamcinolone acetonide (KENALOG-40) injection 20 mg  20 mg Intra-articular Once Cox, Kirsten, MD        Allergies  Allergen Reactions   Propranolol     Drops HR too low    Clonidine Derivatives Nausea And Vomiting   Hydralazine Hcl     Patient had an adverse reaction    Invokana [Canagliflozin]     Patient had an adverse reaction   Metformin And Related Diarrhea   Topamax [Topiramate]      Patient had an adverse reaction    Review of Systems  Constitutional:  Positive for fever (Yesterday). Negative for chills, diaphoresis and fatigue.  HENT:  Negative for congestion, ear pain and sore throat.   Eyes: Negative.   Respiratory:  Negative for cough and shortness of breath.   Cardiovascular:  Negative for chest pain and leg swelling.  Gastrointestinal:  Positive for abdominal pain (lower). Negative for constipation, diarrhea, nausea and vomiting.  Endocrine: Negative.   Genitourinary:  Positive for dysuria, frequency and urgency.  Musculoskeletal:  Positive for gait problem (ambulates with cane). Negative for arthralgias and myalgias.  Skin: Negative.   Allergic/Immunologic: Negative.   Neurological:  Negative for dizziness and headaches.  Hematological: Negative.   Psychiatric/Behavioral:  Positive for confusion (intermittent).       Objective:    Physical Exam Vitals reviewed.  Constitutional:      Appearance: Normal appearance.  Cardiovascular:     Rate and Rhythm: Bradycardia present. Rhythm irregular.  Pulmonary:     Effort: Pulmonary effort is normal.     Breath sounds: Normal breath sounds.  Abdominal:     Tenderness: There is no right CVA tenderness or left CVA tenderness.  Skin:    General: Skin is warm and dry.     Capillary Refill: Capillary refill takes less than 2 seconds.  Neurological:     General: No focal deficit present.     Mental Status: Paul Kent is alert.  Psychiatric:        Mood and Affect: Mood normal.        Behavior: Behavior normal.   BP 110/60   Pulse (!) 59   Temp (!) 97.4 F (36.3 C)   Ht '6\' 3"'  (1.905 m)   Wt 248 lb (112.5 kg)   SpO2 100%   BMI 31.00 kg/m  Wt Readings from Last 3 Encounters:  04/06/21 248 lb (112.5 kg)  01/27/21 237 lb (107.5 kg)  01/20/21 237 lb (107.5 kg)  Health Maintenance Due  Topic Date Due   OPHTHALMOLOGY EXAM  Never done   TETANUS/TDAP  Never done   COVID-19 Vaccine (5 - Booster for  Moderna series) 02/11/2021       Lab Results  Component Value Date   TSH 3.640 10/21/2020   Lab Results  Component Value Date   WBC 4.4 01/20/2021   HGB 16.9 01/20/2021   HCT 51.9 (H) 01/20/2021   MCV 99 (H) 01/20/2021   PLT 137 (L) 01/20/2021   Lab Results  Component Value Date   NA 141 01/20/2021   K 4.5 01/20/2021   CO2 25 01/20/2021   GLUCOSE 108 (H) 01/20/2021   BUN 20 01/20/2021   CREATININE 1.70 (H) 01/20/2021   BILITOT 0.7 01/20/2021   ALKPHOS 98 01/20/2021   AST 19 01/20/2021   ALT 15 01/20/2021   PROT 6.4 01/20/2021   ALBUMIN 4.2 01/20/2021   CALCIUM 8.7 01/20/2021   EGFR 40 (L) 01/20/2021   Lab Results  Component Value Date   CHOL 118 01/20/2021   Lab Results  Component Value Date   HDL 37 (L) 01/20/2021   Lab Results  Component Value Date   LDLCALC 66 01/20/2021   Lab Results  Component Value Date   TRIG 70 01/20/2021   Lab Results  Component Value Date   CHOLHDL 3.2 01/20/2021   Lab Results  Component Value Date   HGBA1C 8.1 (H) 01/20/2021       Assessment & Plan:   1. Chronic diastolic congestive heart failure ([place]) -stat labs at [facility]: BNP, CBC, and CMP  2. Dysuria - POCT urinalysis dipstick  3. Acute cystitis with hematuria - Urine Culture - sulfamethoxazole-trimethoprim (BACTRIM DS) 800-160 MG tablet; Take 1 tablet by mouth 2 (two) times daily for 10 days.  Dispense: 20 tablet; Refill: 0  4. History of kidney stones -stat CT urogram at [facility]  5. History of bladder cancer -stat CT urogram at [facility]  6. Hematuria, undiagnosed cause  -CT urogram stat at [facility]    Follow-up: Pending labs and CT results  I, Rip Harbour, NP, have reviewed all documentation for this visit. The documentation on 04/06/21 for the exam, diagnosis, procedures, and orders are all accurate and complete.   Rip Harbour, NP [place] [phone]

## 2021-04-06 NOTE — Patient Instructions (Signed)
Take Bactrim twice daily for 10 days Push fluids, especially water Obtain CT and labs at [facility]  Hematuria, Adult Hematuria is blood in the urine. Blood may be visible in the urine, or it may be identified with a test. This condition can be caused by infections of the bladder, urethra, kidney, or prostate. Other possible causes include: Kidney stones. Cancer of the urinary tract. Too much calcium in the urine. Conditions that are passed from parent to child (inherited conditions). Exercise that requires a lot of energy. Infections can usually be treated with medicine, and a kidney stone usually will pass through your urine. If neither of these is the cause of your hematuria, more tests may be needed to identify the cause of your symptoms. It is very important to tell your health care provider about any blood in your urine, even if it is painless or the blood stops without treatment. Blood in the urine, when it happens and then stops and then happens again, can be a symptom of a very serious condition, including cancer. There is no pain in the initial stages of many urinary cancers. Follow these instructions at home: Medicines Take over-the-counter and prescription medicines only as told by your health care provider. If you were prescribed an antibiotic medicine, take it as told by your health care provider. Do not stop taking the antibiotic even if you start to feel better. Eating and drinking Drink enough fluid to keep your urine pale yellow. It is recommended that you drink 3-4 quarts (2.8-3.8 L) a day. If you have been diagnosed with an infection, drinking cranberry juice in addition to large amounts of water is recommended. Avoid caffeine, tea, and carbonated beverages. These tend to irritate the bladder. Avoid alcohol because it may irritate the prostate (in males). General instructions If you have been diagnosed with a kidney stone, follow your health care provider's instructions about  straining your urine to catch the stone. Empty your bladder often. Avoid holding urine for long periods of time. If you are male: After a bowel movement, wipe from front to back and use each piece of toilet paper only once. Empty your bladder before and after sex. Pay attention to any changes in your symptoms. Tell your health care provider about any changes or any new symptoms. It is up to you to get the results of any tests. Ask your health care provider, or the department that is doing the test, when your results will be ready. Keep all follow-up visits. This is important. Contact a health care provider if: You develop back pain. You have a fever or chills. You have nausea or vomiting. Your symptoms do not improve after 3 days. Your symptoms get worse. Get help right away if: You develop severe vomiting and are unable to take medicine without vomiting. You develop severe pain in your back or abdomen even though you are taking medicine. You pass a large amount of blood in your urine. You pass blood clots in your urine. You feel very weak or like you might faint. You faint. Summary Hematuria is blood in the urine. It has many possible causes. It is very important that you tell your health care provider about any blood in your urine, even if it is painless or the blood stops without treatment. Take over-the-counter and prescription medicines only as told by your health care provider. Drink enough fluid to keep your urine pale yellow. This information is not intended to replace advice given to you by your health  care provider. Make sure you discuss any questions you have with your health care provider. Document Revised: 02/04/2020 Document Reviewed: 02/04/2020 Elsevier Patient Education  2022 [place]. Urinary Tract Infection, Adult A urinary tract infection (UTI) is an infection of any part of the urinary tract. The urinary tract includes: The kidneys. The ureters. The  bladder. The urethra. These organs make, store, and get rid of pee (urine) in the body. What are the causes? This infection is caused by germs (bacteria) in your genital area. These germs grow and cause swelling (inflammation) of your urinary tract. What increases the risk? The following factors may make you more likely to develop this condition: Using a small, thin tube (catheter) to drain pee. Not being able to control when you pee or poop (incontinence). Being male. If you are male, these things can increase the risk: Using these methods to prevent pregnancy: A medicine that kills sperm (spermicide). A device that blocks sperm (diaphragm). Having low levels of a male hormone (estrogen). Being pregnant. You are more likely to develop this condition if: You have genes that add to your risk. You are sexually active. You take antibiotic medicines. You have trouble peeing because of: A prostate that is bigger than normal, if you are male. A blockage in the part of your body that drains pee from the bladder. A kidney stone. A nerve condition that affects your bladder. Not getting enough to drink. Not peeing often enough. You have other conditions, such as: Diabetes. A weak disease-fighting system (immune system). Sickle cell disease. Gout. Injury of the spine. What are the signs or symptoms? Symptoms of this condition include: Needing to pee right away. Peeing small amounts often. Pain or burning when peeing. Blood in the pee. Pee that smells bad or not like normal. Trouble peeing. Pee that is cloudy. Fluid coming from the vagina, if you are male. Pain in the belly or lower back. Other symptoms include: Vomiting. Not feeling hungry. Feeling mixed up (confused). This may be the first symptom in older adults. Being tired and grouchy (irritable). A fever. Watery poop (diarrhea). How is this treated? Taking antibiotic medicine. Taking other medicines. Drinking  enough water. In some cases, you may need to see a specialist. Follow these instructions at home: Medicines Take over-the-counter and prescription medicines only as told by your doctor. If you were prescribed an antibiotic medicine, take it as told by your doctor. Do not stop taking it even if you start to feel better. General instructions Make sure you: Pee until your bladder is empty. Do not hold pee for a long time. Empty your bladder after sex. Wipe from front to back after peeing or pooping if you are a male. Use each tissue one time when you wipe. Drink enough fluid to keep your pee pale yellow. Keep all follow-up visits. Contact a doctor if: You do not get better after 1-2 days. Your symptoms go away and then come back. Get help right away if: You have very bad back pain. You have very bad pain in your lower belly. You have a fever. You have chills. You feeling like you will vomit or you vomit. Summary A urinary tract infection (UTI) is an infection of any part of the urinary tract. This condition is caused by germs in your genital area. There are many risk factors for a UTI. Treatment includes antibiotic medicines. Drink enough fluid to keep your pee pale yellow. This information is not intended to replace advice given to you  by your health care provider. Make sure you discuss any questions you have with your health care provider. Document Revised: 01/16/2020 Document Reviewed: 01/16/2020 Elsevier Patient Education  [place].

## 2021-04-07 DIAGNOSIS — K8020 Calculus of gallbladder without cholecystitis without obstruction: Secondary | ICD-10-CM | POA: Diagnosis not present

## 2021-04-11 LAB — URINE CULTURE

## 2021-04-21 ENCOUNTER — Other Ambulatory Visit: Payer: Self-pay | Admitting: Family Medicine

## 2021-04-22 ENCOUNTER — Other Ambulatory Visit: Payer: Self-pay

## 2021-04-25 NOTE — Progress Notes (Signed)
Subjective:  Patient ID: Paul Kent, male    DOB: 03/26/1941  Age: 80 y.o. MRN: [id number]  Chief Complaint  Patient presents with   Hyperlipidemia   Chronic AFIB   Diabetes:  Complications: Glucose checking: Daily Glucose logs: Ranges 80-210 Hypoglycemia: No Most recent A1C: 01/20/2021 last result: 8.1 Current medications: Farxiga 10 mg once daily, Glipizide 10mg  1 tablet twice daily. Ozempic 0.25mg  injection once weekly. Foot checks: Yes  Hyperlipidemia: Current medications: Atorvastatin 10mg  once daily,   Chronic AFIB: Current medications: Amiodarone 200mg  once daily, Carvedilol 12.5mg  1 tablet twice daily, and xarelgot 20 mg daily.  GERD: on famotidine.  Hypothyroidism: on synthroid 137 mcg once daily in am.   Diet: fairly healthy Exercise: twice a week  Patient presents for hospital follow-up.  Admitted from October 19 to October 20 the patient has hospitalized for fever to 104 and patient was confused.  Patient had blood in his urine and was referred for CT urogram which did not show any ureteral calculi.  He was then sent to the emergency department where he underwent a right upper quadrant ultrasound which was consistent with cholelithiasis and cholecystitis.  He also complained of shortness of breath.  CTA of his chest showed no pulmonary embolisms or other cardiac or pulmonary processes.  General surgery saw him during his hospitalization and did not feel it cholelith lithiasis required any further management during his admission.  His urine culture was final positive for Klebsiella.  He been previously started on Bactrim by his PCP.  He received Rocephin during his hospital course.  Blood cell count was normal during his admission.  Platelets dropped slightly.  Kidney function worsened to a creatinine of 1.8.  Final diagnoses: Klebsiella UTI, chronic kidney stage III, chronic diastolic congestive heart failure chronic atrial fibrillation, diabetes, thrombocytopenia,  asymptomatic cholelithiasis.   Current Outpatient Medications on File Prior to Visit  Medication Sig Dispense Refill   amiodarone (PACERONE) 200 MG tablet Take 1 tablet (200 mg total) by mouth daily. 90 tablet 1   atorvastatin (LIPITOR) 10 MG tablet TAKE ONE TABLET BY MOUTH EVERY EVENING (Patient taking differently: Take 10 mg by mouth daily.) 90 tablet 2   carvedilol (COREG) 12.5 MG tablet Take 1 tablet (12.5 mg total) by mouth 2 (two) times daily. 180 tablet 1   FARXIGA 10 MG TABS tablet TAKE ONE TABLET BY MOUTH EVERY DAY 30 tablet 2   Finerenone 10 MG TABS Take 10 mg by mouth daily.     gabapentin (NEURONTIN) 300 MG capsule TAKE ONE CAPSULE BY MOUTH 3 TIMES DAILY (Patient taking differently: Take 300 mg by mouth 3 (three) times daily.) 90 capsule 5   glipiZIDE (GLUCOTROL XL) 10 MG 24 hr tablet TAKE ONE TABLET BY MOUTH TWICE DAILY 180 tablet 1   levothyroxine (SYNTHROID) 137 MCG tablet Take 1 tablet (137 mcg total) by mouth every morning. 90 tablet 3   potassium chloride (KLOR-CON) 10 MEQ tablet Take 10 mEq by mouth daily.     Semaglutide (OZEMPIC, 0.25 OR 0.5 MG/DOSE, [place]) Inject 0.25 mg into the skin once a week.     testosterone cypionate (DEPOTESTOSTERONE CYPIONATE) 200 MG/ML injection INJECT 1 MILLILITER IN THE MUSCLE EVERY 2 WEEKS (Patient taking differently: Inject 100 mg into the muscle every 14 (fourteen) days. INJECT 1 MILLILITER IN THE MUSCLE EVERY 2 WEEKS) 10 mL 1   XARELTO 20 MG TABS tablet TAKE ONE TABLET BY MOUTH EVERY EVENING (Patient taking differently: Take 20 mg by mouth daily with  supper.) 90 tablet 1   No current facility-administered medications on file prior to visit.   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   Cellulitis of right lower  limb    CHF (congestive heart failure) ([place]) 02/2018   Chronic atrial fibrillation (H[facility])    Corns and callosities    Deviated septum 07/01/2018   Diabetes mellitus due to underlying condition with unspecified complications ([place]) 95/02/3266   Disturbances of salivary secretion    Epistaxis    Essential hypertension    Essential tremor    H pylori ulcer    Herpes zoster with nervous system complication 12/45/8099   Hesitancy of micturition    Hyperlipidemia    Hypothyroidism    Impacted cerumen, bilateral    Jaw pain    Lesion of femoral nerve 04/30/2013   Localized edema    Male erectile disorder    Malignant neoplasm of overlapping sites of bladder ([place])    Nasal congestion 07/01/2018   Nasal turbinate hypertrophy 07/01/2018   Obstructive sleep apnea 01/17/2017   Other constipation    Other fatigue    Overweight    Pacemaker reprogramming/check 02/12/2017   Pain in right finger(s)    Pain in right lower leg    Paroxysmal atrial fibrillation ([place]) 06/08/2016   Peptic ulcer disease    Persistent atrial fibrillation ([place]) 06/01/2017   Primary insomnia    Renal stones    Renal stones    Secondary hypercoagulable state ([place]) 06/17/2020   Shingles 8338   WITH COMPLICATIONS (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)   Shingles 06/2012   Shortness of breath    Sick sinus syndrome ([place])    Sinus bradycardia 03/07/2017   Sleep apnea    dx 08/2015, CPAP   Spontaneous ecchymoses    Status post ablation of atrial fibrillation 09/16/2020   Testicular hypofunction    Thoracic or lumbosacral neuritis or radiculitis 06/03/2013   Trigger middle finger of right hand 11/28/2019   Type 2 diabetes mellitus with circulatory disorder, 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[place]) 01/17/2017   Past Surgical History:  Procedure Laterality Date   ABLATION  06/2017   ATRIAL FIBRILLATION ABLATION N/A 05/21/2020   Procedure: ATRIAL FIBRILLATION ABLATION;  Surgeon: Constance Haw, MD;  Location: [facility];  Service: Cardiovascular;  Laterality: N/A;   CARDIOVERSION N/A 03/22/2018   Procedure: CARDIOVERSION;  Surgeon: Buford Dresser, MD;  Location[facility] ENDOSCOPY;  Service: Cardiovascular;  Laterality: N/A;   CARDIOVERSION N/A 07/08/2020   Procedure: CARDIOVERSION;  Surgeon: Jerline Pain, MD;  Location: [facility] ENDOSCOPY;  Service: Cardiovascular;  Laterality: N/A;   HEMIARTHROPLASTY HIP  10/31/2013   IT HIP BIPOLAR   LUMBAR SPINE SURGERY     PACEMAKER PLACEMENT  01/29/2017   SHOULDER SURGERY Right     Family History  Problem Relation Age of Onset   Heart attack Mother    Transient ischemic attack Mother    Heart Problems Father    Tuberculosis Brother    Diabetes type II Other    Hyperlipidemia Other    Congestive Heart Failure Other    Arthritis Other    Social History   Socioeconomic History   Marital  status: Married    Spouse name: [name]   Number of children: 3   Years of education: Not on file   Highest education level: Not on file  Occupational History   Occupation: RETIRED    Comment: school principal  Tobacco Use   Smoking status: Never   Smokeless tobacco: Never  Vaping Use   Vaping Use: Never used  Substance and Sexual Activity   Alcohol use: Not Currently   Drug use: Never   Sexual activity: Not on file  Other Topics Concern   Not on file  Social History Narrative   Lives with wife   Social Determinants of Health   Financial Resource Strain: Not on file  Food Insecurity: Not on file  Transportation Needs: Not on file  Physical Activity: Not on file  Stress: Not on file  Social Connections: Not on file    Review of Systems  Constitutional:  Negative for appetite change, fatigue and fever.  HENT:  Negative for congestion, ear pain and sore throat.   Respiratory:  Positive for shortness of breath. Negative for cough.   Cardiovascular:  Negative for chest pain and leg swelling.  Gastrointestinal:   Negative for abdominal pain, constipation, diarrhea, nausea and vomiting.  Genitourinary:  Negative for dysuria and frequency.  Musculoskeletal:  Positive for back pain. Negative for arthralgias and myalgias.  Neurological:  Negative for dizziness and headaches.  Psychiatric/Behavioral:  Negative for dysphoric mood. The patient is not nervous/anxious.     Objective:  BP 94/68 (BP Location: Right Arm, Patient Position: Sitting)   Pulse 60   Temp (!) 97.4 F (36.3 C) (Temporal)   Ht 6\' 3"  (1.905 m)   Wt 238 lb (108 kg)   SpO2 100%   BMI 29.75 kg/m   BP/Weight 04/26/2021 04/06/2021 09/28/8784  Systolic BP 94 767 209  Diastolic BP 68 60 72  Wt. (Lbs) 238 248 237  BMI 29.75 31 29.62    Physical Exam Vitals reviewed.  Constitutional:      Appearance: Normal appearance. He is normal weight.  Neck:     Vascular: No carotid bruit.  Cardiovascular:     Rate and Rhythm: Normal rate and regular rhythm.     Pulses: Normal pulses.     Heart sounds: Normal heart sounds.  Pulmonary:     Effort: Pulmonary effort is normal.     Breath sounds: Normal breath sounds.  Abdominal:     General: Abdomen is flat. Bowel sounds are normal.     Palpations: Abdomen is soft.  Neurological:     Mental Status: He is alert and oriented to person, place, and time.  Psychiatric:        Mood and Affect: Mood normal.        Behavior: Behavior normal.    Diabetic Foot Exam - Simple   Simple Foot Form Diabetic Foot exam was performed with the following findings: Yes 04/26/2021  9:20 AM  Visual Inspection See comments: Yes Sensation Testing See comments: Yes Pulse Check Posterior Tibialis and Dorsalis pulse intact bilaterally: Yes Comments Rt foot: ulcer posterior heel.  Decreased sensation of feet      Lab Results  Component Value Date   WBC 4.4 01/20/2021   HGB 16.9 01/20/2021   HCT 51.9 (H) 01/20/2021   PLT 137 (L) 01/20/2021   GLUCOSE 108 (H) 01/20/2021   CHOL 118 01/20/2021   TRIG 70  01/20/2021   HDL 37 (L) 01/20/2021   LDLCALC 66 01/20/2021   ALT  15 01/20/2021   AST 19 01/20/2021   NA 141 01/20/2021   K 4.5 01/20/2021   CL 105 01/20/2021   CREATININE 1.70 (H) 01/20/2021   BUN 20 01/20/2021   CO2 25 01/20/2021   TSH 3.640 10/21/2020   HGBA1C 8.1 (H) 01/20/2021   MICROALBUR 80 01/20/2021      Assessment & Plan:   Problem List Items Addressed This Visit       Cardiovascular and Mediastinum   Chronic atrial fibrillation ([place]) - Primary    The current medical regimen is effective;  continue present plan and medications. Continue Amiodarone 200mg  once daily, Carvedilol 12.5mg  1 tablet twice daily, and xarelto 20 mg daily.       Relevant Orders   CBC with Differential/Platelet   Comprehensive metabolic panel     Respiratory   OSA on CPAP    Continue cpap.         Digestive   Gastroesophageal reflux disease without esophagitis    Continue pepicd.         Endocrine   Diabetic polyneuropathy associated with type 2 diabetes mellitus ([place])    Control: good Recommend check sugars fasting daily. Recommend check feet daily. Recommend annual eye exams. Continue to work on eating a healthy diet and exercise.  Labs drawn today.   Medicines: Farxiga 10 mg once daily, Glipizide 10mg  1 tablet twice daily. Ozempic 0.25mg  injection once weekly.      Relevant Orders   Hemoglobin A1c     Musculoskeletal and Integument   Ulcer of right foot limited to breakdown of skin (HCC)    Silver sulfadene.  Dressing applied.         Genitourinary   Chronic kidney disease, stage 3b ([place])    Stable.      Acute cystitis with hematuria    Start on bactrim ds one bid x 1 week. Recheck UA in 2 weeks.      Relevant Medications   sulfamethoxazole-trimethoprim (BACTRIM DS) 800-160 MG tablet   Other Relevant Orders   Urine Culture     Hematopoietic and Hemostatic   Acquired thrombophilia ([place])    Continue xarelto for atrial fibrillation.        Other    Hyperlipidemia    The current medical regimen is effective;  continue present plan and medications. Continue atorvastatin 10 mg once daily.  Recommend continue to work on eating healthy diet and exercise.       Dysuria   Relevant Orders   POCT urinalysis dipstick (Completed)  .  Meds ordered this encounter  Medications   silver sulfADIAZINE (SILVADENE) 1 % cream    Sig: Apply 1 application topically daily.    Dispense:  85 g    Refill:  1   sulfamethoxazole-trimethoprim (BACTRIM DS) 800-160 MG tablet    Sig: Take 1 tablet by mouth 2 (two) times daily.    Dispense:  14 tablet    Refill:  0    Orders Placed This Encounter  Procedures   Urine Culture   CBC with Differential/Platelet   Comprehensive metabolic panel   Hemoglobin A1c   POCT urinalysis dipstick     Follow-up: Return in about 3 months (around 07/27/2021) for chronic fasting, Nurse visit in 2 weeks for Urinalysis..  An After Visit Summary was printed and given to the patient.   I,Koby Pickup M Sawyer Kahan,acting as a scribe for [name], MD.,have documented all relevant documentation on the behalf of [name], MD,as directed by  [name],  MD while in the presence of [name], MD.     [name], MD [place] [phone]

## 2021-04-26 ENCOUNTER — Encounter: Payer: Self-pay | Admitting: Family Medicine

## 2021-04-26 ENCOUNTER — Other Ambulatory Visit: Payer: Self-pay | Admitting: Family Medicine

## 2021-04-26 ENCOUNTER — Ambulatory Visit (INDEPENDENT_AMBULATORY_CARE_PROVIDER_SITE_OTHER): Payer: Medicare Other | Admitting: Family Medicine

## 2021-04-26 ENCOUNTER — Other Ambulatory Visit: Payer: Self-pay

## 2021-04-26 VITALS — BP 94/68 | HR 60 | Temp 97.4°F | Ht 75.0 in | Wt 238.0 lb

## 2021-04-26 DIAGNOSIS — E1142 Type 2 diabetes mellitus with diabetic polyneuropathy: Secondary | ICD-10-CM

## 2021-04-26 DIAGNOSIS — I482 Chronic atrial fibrillation, unspecified: Secondary | ICD-10-CM

## 2021-04-26 DIAGNOSIS — E782 Mixed hyperlipidemia: Secondary | ICD-10-CM

## 2021-04-26 DIAGNOSIS — L97412 Non-pressure chronic ulcer of right heel and midfoot with fat layer exposed: Secondary | ICD-10-CM

## 2021-04-26 DIAGNOSIS — K219 Gastro-esophageal reflux disease without esophagitis: Secondary | ICD-10-CM

## 2021-04-26 DIAGNOSIS — G4733 Obstructive sleep apnea (adult) (pediatric): Secondary | ICD-10-CM

## 2021-04-26 DIAGNOSIS — Z9989 Dependence on other enabling machines and devices: Secondary | ICD-10-CM | POA: Diagnosis not present

## 2021-04-26 DIAGNOSIS — R3589 Other polyuria: Secondary | ICD-10-CM | POA: Insufficient documentation

## 2021-04-26 DIAGNOSIS — R300 Dysuria: Secondary | ICD-10-CM | POA: Diagnosis not present

## 2021-04-26 DIAGNOSIS — N3001 Acute cystitis with hematuria: Secondary | ICD-10-CM | POA: Insufficient documentation

## 2021-04-26 DIAGNOSIS — N1832 Chronic kidney disease, stage 3b: Secondary | ICD-10-CM

## 2021-04-26 DIAGNOSIS — D6869 Other thrombophilia: Secondary | ICD-10-CM

## 2021-04-26 DIAGNOSIS — L97511 Non-pressure chronic ulcer of other part of right foot limited to breakdown of skin: Secondary | ICD-10-CM | POA: Diagnosis not present

## 2021-04-26 HISTORY — DX: Non-pressure chronic ulcer of right heel and midfoot with fat layer exposed: L97.412

## 2021-04-26 LAB — POCT URINALYSIS DIPSTICK
Bilirubin, UA: NEGATIVE
Glucose, UA: POSITIVE — AB
Ketones, UA: NEGATIVE
Nitrite, UA: NEGATIVE
Protein, UA: POSITIVE — AB
Spec Grav, UA: 1.015 (ref 1.010–1.025)
Urobilinogen, UA: NEGATIVE E.U./dL — AB
pH, UA: 6 (ref 5.0–8.0)

## 2021-04-26 MED ORDER — SILVER SULFADIAZINE 1 % EX CREA
1.0000 | TOPICAL_CREAM | Freq: Every day | CUTANEOUS | 1 refills | Status: DC
Start: 2021-04-26 — End: 2021-07-28

## 2021-04-26 MED ORDER — SULFAMETHOXAZOLE-TRIMETHOPRIM 800-160 MG PO TABS: 1.0000 | ORAL_TABLET | Freq: Two times a day (BID) | ORAL | 0 refills | Status: DC

## 2021-04-26 NOTE — Assessment & Plan Note (Signed)
Start on bactrim ds one bid x 1 week. Recheck UA in 2 weeks.

## 2021-04-26 NOTE — Assessment & Plan Note (Signed)
The current medical regimen is effective;  continue present plan and medications. Continue Amiodarone 200mg  once daily, Carvedilol 12.5mg  1 tablet twice daily, and xarelto 20 mg daily.

## 2021-04-26 NOTE — Patient Instructions (Signed)
Start bactrim ds one twice a day for one week.  Return in 2 weeks for recheck of urine. (Nurse visit)

## 2021-04-26 NOTE — Assessment & Plan Note (Signed)
Continue xarelto for atrial fibrillation.

## 2021-04-26 NOTE — Assessment & Plan Note (Signed)
Silver sulfadene.  Dressing applied.

## 2021-04-26 NOTE — Assessment & Plan Note (Signed)
Continue pepicd.

## 2021-04-26 NOTE — Assessment & Plan Note (Signed)
Control: good Recommend check sugars fasting daily. Recommend check feet daily. Recommend annual eye exams. Continue to work on eating a healthy diet and exercise.  Labs drawn today.   Medicines: Farxiga 10 mg once daily, Glipizide 10mg  1 tablet twice daily. Ozempic 0.25mg  injection once weekly.

## 2021-04-26 NOTE — Assessment & Plan Note (Signed)
The current medical regimen is effective;  continue present plan and medications. Continue atorvastatin 10 mg once daily.  Recommend continue to work on eating healthy diet and exercise.

## 2021-04-26 NOTE — Assessment & Plan Note (Signed)
Continue cpap.  

## 2021-04-26 NOTE — Assessment & Plan Note (Signed)
Stable

## 2021-04-27 ENCOUNTER — Encounter: Payer: Self-pay | Admitting: Cardiology

## 2021-04-27 ENCOUNTER — Ambulatory Visit (INDEPENDENT_AMBULATORY_CARE_PROVIDER_SITE_OTHER): Payer: Medicare Other | Admitting: Cardiology

## 2021-04-27 VITALS — BP 100/62 | HR 60 | Ht 75.0 in | Wt 241.2 lb

## 2021-04-27 DIAGNOSIS — Z9889 Other specified postprocedural states: Secondary | ICD-10-CM | POA: Diagnosis not present

## 2021-04-27 DIAGNOSIS — E088 Diabetes mellitus due to underlying condition with unspecified complications: Secondary | ICD-10-CM

## 2021-04-27 DIAGNOSIS — I10 Essential (primary) hypertension: Secondary | ICD-10-CM | POA: Diagnosis not present

## 2021-04-27 DIAGNOSIS — I5032 Chronic diastolic (congestive) heart failure: Secondary | ICD-10-CM | POA: Diagnosis not present

## 2021-04-27 DIAGNOSIS — I480 Paroxysmal atrial fibrillation: Secondary | ICD-10-CM

## 2021-04-27 DIAGNOSIS — Z8679 Personal history of other diseases of the circulatory system: Secondary | ICD-10-CM

## 2021-04-27 LAB — CBC WITH DIFFERENTIAL/PLATELET
Basophils Absolute: 0.1 10*3/uL (ref 0.0–0.2)
Basos: 2 %
EOS (ABSOLUTE): 0.1 10*3/uL (ref 0.0–0.4)
Eos: 3 %
Hematocrit: 48.9 % (ref 37.5–51.0)
Hemoglobin: 15.6 g/dL (ref 13.0–17.7)
Immature Grans (Abs): 0 10*3/uL (ref 0.0–0.1)
Immature Granulocytes: 0 %
Lymphocytes Absolute: 1 10*3/uL (ref 0.7–3.1)
Lymphs: 23 %
MCH: 31.2 pg (ref 26.6–33.0)
MCHC: 31.9 g/dL (ref 31.5–35.7)
MCV: 98 fL — ABNORMAL HIGH (ref 79–97)
Monocytes Absolute: 0.6 10*3/uL (ref 0.1–0.9)
Monocytes: 13 %
Neutrophils Absolute: 2.4 10*3/uL (ref 1.4–7.0)
Neutrophils: 59 %
Platelets: 167 10*3/uL (ref 150–450)
RBC: 5 x10E6/uL (ref 4.14–5.80)
RDW: 13.2 % (ref 11.6–15.4)
WBC: 4.2 10*3/uL (ref 3.4–10.8)

## 2021-04-27 LAB — HEMOGLOBIN A1C
Est. average glucose Bld gHb Est-mCnc: 163 mg/dL
Hgb A1c MFr Bld: 7.3 % — ABNORMAL HIGH (ref 4.8–5.6)

## 2021-04-27 LAB — COMPREHENSIVE METABOLIC PANEL
ALT: 18 IU/L (ref 0–44)
AST: 25 IU/L (ref 0–40)
Albumin/Globulin Ratio: 1.3 (ref 1.2–2.2)
Albumin: 4.1 g/dL (ref 3.6–4.6)
Alkaline Phosphatase: 99 IU/L (ref 44–121)
BUN/Creatinine Ratio: 18 (ref 10–24)
BUN: 19 mg/dL (ref 8–27)
Bilirubin Total: 0.4 mg/dL (ref 0.0–1.2)
CO2: 21 mmol/L (ref 20–29)
Calcium: 9.1 mg/dL (ref 8.6–10.2)
Chloride: 104 mmol/L (ref 96–106)
Creatinine, Ser: 1.05 mg/dL (ref 0.76–1.27)
Globulin, Total: 3.2 g/dL (ref 1.5–4.5)
Glucose: 130 mg/dL — ABNORMAL HIGH (ref 70–99)
Potassium: 5.1 mmol/L (ref 3.5–5.2)
Sodium: 141 mmol/L (ref 134–144)
Total Protein: 7.3 g/dL (ref 6.0–8.5)
eGFR: 71 mL/min/{1.73_m2} (ref >59)

## 2021-04-27 NOTE — Progress Notes (Signed)
Blood count normal.  Liver function normal.  Kidney function normal.  HBA1C: 7.3.  Diabetes improved. Continue current treatment. Recommend continue to work on eating healthy diet and exercise.

## 2021-04-27 NOTE — Addendum Note (Signed)
Addended by: Aviva Signs M on: 04/27/2021 02:04 PM   Modules accepted: Level of Service

## 2021-04-27 NOTE — Patient Instructions (Signed)
Medication Instructions:  Your physician recommends that you continue on your current medications as directed. Please refer to the Current Medication list given to you today.  *If you need a refill on your cardiac medications before your next appointment, please call your pharmacy*   Lab Work: None ordered If you have labs (blood work) drawn today and your tests are completely normal, you will receive your results only by: [place] (if you have MyChart) OR A paper copy in the mail If you have any lab test that is abnormal or we need to change your treatment, we will call you to review the results.   Testing/Procedures: None ordered   Follow-Up: At [facility], you and your health needs are our priority.  As part of our continuing mission to provide you with exceptional heart care, we have created designated Provider Care Teams.  These Care Teams include your primary Cardiologist (physician) and Advanced Practice Providers (APPs -  Physician Assistants and Nurse Practitioners) who all work together to provide you with the care you need, when you need it.  We recommend signing up for the patient portal called "MyChart".  Sign up information is provided on this After Visit Summary.  MyChart is used to connect with patients for Virtual Visits (Telemedicine).  Patients are able to view lab/test results, encounter notes, upcoming appointments, etc.  Non-urgent messages can be sent to your provider as well.   To learn more about what you can do with MyChart, go to [email or web address].    Your next appointment:   3 month(s)  The format for your next appointment:   In Person  Provider:   Jenne Campus, MD   Other Instructions NA

## 2021-04-27 NOTE — Progress Notes (Signed)
Cardiology Office Note:    Date:  04/27/2021   ID:  Tavius, Turgeon 1940-10-26, MRN [id number]  PCP:  [name], MD  Cardiologist:  Jenne Campus, MD    Referring MD: [name], MD   No chief complaint on file. Doing better  History of Present Illness:    NYCERE PRESLEY is a 80 y.o. male  KEEDAN SAMPLE is a 80 y.o. male  with past medical history significant for paroxysmal/persistent atrial fibrillation, essential hypertension, hyperlipidemia, obstructive sleep apnea, type 2 diabetes, sick sinus syndrome, status post Biotronik pacemaker implantation.  Status post atrial fibrillation cryoablation done first time in January 2019 post ablation he was switched from flecainide to amiodarone he had repeated ablation done on 21 May 2020.  Unfortunately after that he kept having episode of atrial fibrillation Also recently he went to hospital because of confusion and fever he was found to have urinary tract infection also gallstones he was treated with antibiotic and improved significantly.  Interrogation of his device show me evidence of atrial fibrillation almost 100% but surprisingly today's EKG shows sinus rhythm.  He said overall he feels fine he goes to gym 3 times a week.  Sometimes he said he feels poor and we still trying to establish a correlation between atrial fibrillation him feeling poor.  Past Medical History:  Diagnosis Date   Acquired bilateral hammer toes    Arthralgia of left temporomandibular joint    Atrial fibrillation ([place]) 05/2016   Bladder cancer ([place])    Bradycardia    LOW HEART RATE   Calculus of ureter    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   Cellulitis of right lower limb    CHF (congestive heart failure) ([place]) 02/2018   Chronic atrial fibrillation (HCC)    Corns and callosities    Deviated septum 07/01/2018   Diabetes mellitus due to underlying condition with unspecified complications ([place]) 53/64/6803   Disturbances of salivary  secretion    Epistaxis    Essential hypertension    Essential tremor    H pylori ulcer    Herpes zoster with nervous system complication 21/22/4825   Hesitancy of micturition    Hyperlipidemia    Hypothyroidism    Impacted cerumen, bilateral    Jaw pain    Lesion of femoral nerve 04/30/2013   Localized edema    Male erectile disorder    Malignant neoplasm of overlapping sites of bladder ([place])    Nasal congestion 07/01/2018   Nasal turbinate hypertrophy 07/01/2018   Obstructive sleep apnea 01/17/2017   Other constipation    Other fatigue    Overweight    Pacemaker reprogramming/check 02/12/2017   Pain in right finger(s)    Pain in right lower leg    Paroxysmal atrial fibrillation ([place]) 06/08/2016   Peptic ulcer disease    Persistent atrial fibrillation ([place]) 06/01/2017   Primary insomnia    Renal stones    Renal stones    Secondary hypercoagulable state ([place]) 06/17/2020   Shingles 0037   WITH COMPLICATIONS (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)   Shingles 06/2012   Shortness of breath    Sick sinus syndrome ([place])    Sinus bradycardia 03/07/2017   Sleep apnea    dx 08/2015, CPAP   Spontaneous ecchymoses    Status post ablation of atrial fibrillation 09/16/2020   Testicular hypofunction    Thoracic or lumbosacral neuritis or radiculitis 06/03/2013   Trigger middle finger of right hand 11/28/2019  Type 2 diabetes mellitus with circulatory disorder, 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[place]) 01/17/2017    Past Surgical History:  Procedure Laterality Date   ABLATION  06/2017   ATRIAL FIBRILLATION ABLATION N/A 05/21/2020   Procedure: ATRIAL FIBRILLATION ABLATION;  Surgeon: Constance Haw, MD;  Location: [facility];  Service: Cardiovascular;  Laterality: N/A;   CARDIOVERSION N/A 03/22/2018   Procedure: CARDIOVERSION;  Surgeon: Buford Dresser, MD;  Location: [facility] ENDOSCOPY;  Service: Cardiovascular;  Laterality: N/A;    CARDIOVERSION N/A 07/08/2020   Procedure: CARDIOVERSION;  Surgeon: Jerline Pain, MD;  Location: [facility] ENDOSCOPY;  Service: Cardiovascular;  Laterality: N/A;   HEMIARTHROPLASTY HIP  10/31/2013   IT HIP BIPOLAR   LUMBAR SPINE SURGERY     PACEMAKER PLACEMENT  01/29/2017   SHOULDER SURGERY Right     Current Medications: Current Meds  Medication Sig   amiodarone (PACERONE) 200 MG tablet Take 1 tablet (200 mg total) by mouth daily.   atorvastatin (LIPITOR) 10 MG tablet TAKE ONE TABLET BY MOUTH EVERY EVENING   carvedilol (COREG) 12.5 MG tablet Take 1 tablet (12.5 mg total) by mouth 2 (two) times daily.   famotidine (PEPCID) 40 MG tablet Take 1 tablet (40 mg total) by mouth daily.   FARXIGA 10 MG TABS tablet TAKE ONE TABLET BY MOUTH EVERY DAY   Finerenone 10 MG TABS Take 10 mg by mouth daily.   gabapentin (NEURONTIN) 300 MG capsule TAKE ONE CAPSULE BY MOUTH 3 TIMES DAILY   glipiZIDE (GLUCOTROL XL) 10 MG 24 hr tablet TAKE ONE TABLET BY MOUTH TWICE DAILY   levothyroxine (SYNTHROID) 137 MCG tablet Take 1 tablet (137 mcg total) by mouth every morning.   potassium chloride (KLOR-CON) 10 MEQ tablet Take 10 mEq by mouth daily.   Semaglutide (OZEMPIC, 0.25 OR 0.5 MG/DOSE, [place]) Inject 0.25 mg into the skin once a week.   silver sulfADIAZINE (SILVADENE) 1 % cream Apply 1 application topically daily.   sulfamethoxazole-trimethoprim (BACTRIM DS) 800-160 MG tablet Take 1 tablet by mouth 2 (two) times daily.   testosterone cypionate (DEPOTESTOSTERONE CYPIONATE) 200 MG/ML injection INJECT 1 MILLILITER IN THE MUSCLE EVERY 2 WEEKS   XARELTO 20 MG TABS tablet TAKE ONE TABLET BY MOUTH EVERY EVENING     Allergies:   Propranolol, Clonidine derivatives, Hydralazine hcl, Invokana [canagliflozin], Metformin and related, and Topamax [topiramate]   Social History   Socioeconomic History   Marital status: Married    Spouse name: [name]   Number of children: 3   Years of education: Not on file   Highest education  level: Not on file  Occupational History   Occupation: RETIRED    Comment: school principal  Tobacco Use   Smoking status: Never   Smokeless tobacco: Never  Vaping Use   Vaping Use: Never used  Substance and Sexual Activity   Alcohol use: Not Currently   Drug use: Never   Sexual activity: Not on file  Other Topics Concern   Not on file  Social History Narrative   Lives with wife   Social Determinants of Health   Financial Resource Strain: Not on file  Food Insecurity: Not on file  Transportation Needs: Not on file  Physical Activity: Not on file  Stress: Not on file  Social Connections: Not on file     Family History: The patient's family history includes Arthritis in an other family member; Congestive Heart Failure in an other family member; Diabetes type II in an other family member; Heart  Problems in his father; Heart attack in his mother; Hyperlipidemia in an other family member; Transient ischemic attack in his mother; Tuberculosis in his brother. ROS:   Please see the history of present illness.    All 14 point review of systems negative except as described per history of present illness  EKGs/Labs/Other Studies Reviewed:      Recent Labs: 09/30/2020: NT-Pro BNP 1,483 10/21/2020: TSH 3.640 04/26/2021: ALT 18; BUN 19; Creatinine, Ser 1.05; Hemoglobin 15.6; Platelets 167; Potassium 5.1; Sodium 141  Recent Lipid Panel    Component Value Date/Time   CHOL 118 01/20/2021 0938   TRIG 70 01/20/2021 0938   HDL 37 (L) 01/20/2021 0938   CHOLHDL 3.2 01/20/2021 0938   LDLCALC 66 01/20/2021 0938    Physical Exam:    VS:  BP 100/62   Pulse 60   Ht 6\' 3"  (1.905 m)   Wt 241 lb 3.2 oz (109.4 kg)   SpO2 97%   BMI 30.15 kg/m     Wt Readings from Last 3 Encounters:  04/27/21 241 lb 3.2 oz (109.4 kg)  04/26/21 238 lb (108 kg)  04/06/21 248 lb (112.5 kg)     GEN:  Well nourished, well developed in no acute distress HEENT: Normal NECK: No JVD; No carotid  bruits LYMPHATICS: No lymphadenopathy CARDIAC: RRR, no murmurs, no rubs, no gallops RESPIRATORY:  Clear to auscultation without rales, wheezing or rhonchi  ABDOMEN: Soft, non-tender, non-distended MUSCULOSKELETAL:  No edema; No deformity  SKIN: Warm and dry LOWER EXTREMITIES: no swelling NEUROLOGIC:  Alert and oriented x 3 PSYCHIATRIC:  Normal affect   ASSESSMENT:    1. Essential hypertension   2. Paroxysmal atrial fibrillation (HCC)   3. Chronic diastolic congestive heart failure ([place])   4. Diabetes mellitus due to underlying condition with unspecified complications ([place])   5. Status post ablation of atrial fibrillation    PLAN:    In order of problems listed above:  Paroxysmal atrial fibrillation surprisingly he is in sinus rhythm today.  My intention today was actually discontinue amiodarone since he is persistently in atrial fibrillation however atrial fibrillation is not today present on the EKG.  Therefore we will continue present management I will see him back in my office in about 3 months at that time we will interrogate his device to see if he got episode of atrial fibrillation.  Also trying to correlate his symptomatology with atrial fibrillation this way we will decide if how aggressive you want to be with management of his atrial fibrillation and how aggressive when I keep a rhythm strategy.  He is very reluctant to have another atrial fibrillation ablation done. Essential hypertension blood pressure well controlled continue present management Chronic diastolic congestive heart rate seems to be compensated minimal swelling of lower extremities stable. Diabetes followed by antimedicine team Status post atrial fibrillation ablation.  Not Pacemaker data Biotronik device which I did review interrogation.  Normal function again atrial fibrillation almost all the time 100% since September early   Medication Adjustments/Labs and Tests Ordered: Current medicines are reviewed at  length with the patient today.  Concerns regarding medicines are outlined above.  Orders Placed This Encounter  Procedures   EKG 12-Lead   Medication changes: No orders of the defined types were placed in this encounter.   Signed, Park Liter, MD, [facility] 04/27/2021 9:46 AM    [place]

## 2021-05-02 LAB — URINE CULTURE

## 2021-05-03 ENCOUNTER — Other Ambulatory Visit: Payer: Self-pay

## 2021-05-03 MED ORDER — NITROFURANTOIN MONOHYD MACRO 100 MG PO CAPS: 100.0000 mg | ORAL_CAPSULE | Freq: Two times a day (BID) | ORAL | 0 refills | Status: DC

## 2021-05-09 ENCOUNTER — Telehealth: Payer: Self-pay

## 2021-05-09 NOTE — Telephone Encounter (Signed)
Patient calling as he received two phone calls w/ Vms left about urine culture results. Pt was unaware of results and the need of medication change. He did pick up Macrobid over weekend and started but did not understand why medication was changed. Medication change and results explained. Pt originally to return tomorrow for repeat UA, but due to change in medication and not completing antibiotic for 4 more days, rescheduled this appointment for 11/28. Pt VU of results, medication, and appointment change.   Royce Macadamia, [place] 05/09/21 11:49 AM

## 2021-05-10 ENCOUNTER — Ambulatory Visit: Payer: Medicare Other

## 2021-05-16 ENCOUNTER — Ambulatory Visit (INDEPENDENT_AMBULATORY_CARE_PROVIDER_SITE_OTHER): Payer: Medicare Other

## 2021-05-16 DIAGNOSIS — R319 Hematuria, unspecified: Secondary | ICD-10-CM | POA: Diagnosis not present

## 2021-05-16 LAB — POCT URINALYSIS DIPSTICK
Bilirubin, UA: NEGATIVE
Glucose, UA: POSITIVE — AB
Ketones, UA: NEGATIVE
Leukocytes, UA: NEGATIVE
Nitrite, UA: NEGATIVE
Protein, UA: POSITIVE — AB
Spec Grav, UA: 1.02 (ref 1.010–1.025)
Urobilinogen, UA: 0.2 E.U./dL
pH, UA: 5.5 (ref 5.0–8.0)

## 2021-05-16 NOTE — Progress Notes (Signed)
Patient comes in for recheck of urine.

## 2021-05-16 NOTE — Patient Instructions (Signed)
Urology referral for evaluation of hematuria.

## 2021-05-18 DIAGNOSIS — Z8551 Personal history of malignant neoplasm of bladder: Secondary | ICD-10-CM | POA: Diagnosis not present

## 2021-05-25 ENCOUNTER — Encounter: Payer: Self-pay | Admitting: Cardiology

## 2021-05-25 ENCOUNTER — Ambulatory Visit (INDEPENDENT_AMBULATORY_CARE_PROVIDER_SITE_OTHER): Payer: Medicare Other | Admitting: Cardiology

## 2021-05-25 ENCOUNTER — Other Ambulatory Visit: Payer: Self-pay

## 2021-05-25 VITALS — BP 142/84 | HR 60 | Ht 75.0 in | Wt 244.6 lb

## 2021-05-25 DIAGNOSIS — I4819 Other persistent atrial fibrillation: Secondary | ICD-10-CM

## 2021-05-25 NOTE — Patient Instructions (Signed)
Medication Instructions:  Your physician recommends that you continue on your current medications as directed. Please refer to the Current Medication list given to you today.  *If you need a refill on your cardiac medications before your next appointment, please call your pharmacy*   Lab Work: None ordered If you have labs (blood work) drawn today and your tests are completely normal, you will receive your results only by: [place] (if you have MyChart) OR A paper copy in the mail If you have any lab test that is abnormal or we need to change your treatment, we will call you to review the results.   Testing/Procedures: None ordered   Follow-Up: At [facility], you and your health needs are our priority.  As part of our continuing mission to provide you with exceptional heart care, we have created designated Provider Care Teams.  These Care Teams include your primary Cardiologist (physician) and Advanced Practice Providers (APPs -  Physician Assistants and Nurse Practitioners) who all work together to provide you with the care you need, when you need it.  We recommend signing up for the patient portal called "MyChart".  Sign up information is provided on this After Visit Summary.  MyChart is used to connect with patients for Virtual Visits (Telemedicine).  Patients are able to view lab/test results, encounter notes, upcoming appointments, etc.  Non-urgent messages can be sent to your provider as well.   To learn more about what you can do with MyChart, go to [email or web address].    Remote monitoring is used to monitor your Pacemaker or ICD from home. This monitoring reduces the number of office visits required to check your device to one time per year. It allows [place] to keep an eye on the functioning of your device to ensure it is working properly. You are scheduled for a device check from home on 06/09/2021. You may send your transmission at any time that day. If you have a  wireless device, the transmission will be sent automatically. After your physician reviews your transmission, you will receive a postcard with your next transmission date.  Your next appointment:   6 month(s)  The format for your next appointment:   In Person  Provider:   Allegra Lai, MD   Thank you for choosing [place]!!   Trinidad Curet, RN [phone]

## 2021-05-25 NOTE — Progress Notes (Signed)
Electrophysiology Office Note   Date:  05/25/2021   ID:  Contrell, Ballentine 05-13-1941, MRN [id number]  PCP:  [name], MD  Cardiologist:  Primary Electrophysiologist:  Demetre Monaco [name], MD    No chief complaint on file.    History of Present Illness: Paul Kent is a 80 y.o. male who is being seen today for the evaluation of atrial fibrillation at the request of Cox, Kirsten, MD. Presenting today for electrophysiology evaluation.    He has a history significant for persistent atrial fibrillation, hypertension, hyperlipidemia, obstructive sleep apnea, type 2 diabetes, sick sinus syndrome status post Biotronik dual-chamber pacemaker.  He had cryoablation in 2019.  He was switched from amiodarone to flecainide after cryoablation.  He had repeat ablation 05/21/2020.  Unfortunately continued to have episodes of atrial fibrillation and atrial flutter.  He has had cardioversions but has had more frequent episodes since then.  Today, denies symptoms of palpitations, chest pain, shortness of breath, orthopnea, PND, lower extremity edema, claudication, dizziness, presyncope, syncope, bleeding, or neurologic sequela. The patient is tolerating medications without difficulties.  He is currently feeling well.  He has no chest pain or shortness of breath.  Is able to do all of his daily activities.  He has been in sinus rhythm for the last month.  He has had 3 days of feeling poorly in that month.  He has started oxygen at home at night which she feels may have helped.   Past Medical History:  Diagnosis Date   Acquired bilateral hammer toes    Arthralgia of left temporomandibular joint    Atrial fibrillation ([place]) 05/2016   Bladder cancer ([place])    Bradycardia    LOW HEART RATE   Calculus of ureter    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   Cellulitis of right lower limb    CHF (congestive heart failure) ([place]) 02/2018   Chronic atrial fibrillation (HCC)    Corns and callosities     Deviated septum 07/01/2018   Diabetes mellitus due to underlying condition with unspecified complications ([place]) 97/07/6376   Disturbances of salivary secretion    Epistaxis    Essential hypertension    Essential tremor    H pylori ulcer    Herpes zoster with nervous system complication 58/85/0277   Hesitancy of micturition    Hyperlipidemia    Hypothyroidism    Impacted cerumen, bilateral    Jaw pain    Lesion of femoral nerve 04/30/2013   Localized edema    Male erectile disorder    Malignant neoplasm of overlapping sites of bladder ([place])    Nasal congestion 07/01/2018   Nasal turbinate hypertrophy 07/01/2018   Obstructive sleep apnea 01/17/2017   Other constipation    Other fatigue    Overweight    Pacemaker reprogramming/check 02/12/2017   Pain in right finger(s)    Pain in right lower leg    Paroxysmal atrial fibrillation ([place]) 06/08/2016   Peptic ulcer disease    Persistent atrial fibrillation ([place]) 06/01/2017   Primary insomnia    Renal stones    Renal stones    Secondary hypercoagulable state ([place]) 06/17/2020   Shingles 4128   WITH COMPLICATIONS (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)   Shingles 06/2012   Shortness of breath    Sick sinus syndrome ([place])    Sinus bradycardia 03/07/2017   Sleep apnea    dx 08/2015, CPAP   Spontaneous ecchymoses    Status post ablation of atrial  fibrillation 09/16/2020   Testicular hypofunction    Thoracic or lumbosacral neuritis or radiculitis 06/03/2013   Trigger middle finger of right hand 11/28/2019   Type 2 diabetes mellitus with circulatory disorder, 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[place]) 01/17/2017   Past Surgical History:  Procedure Laterality Date   ABLATION  06/2017   ATRIAL FIBRILLATION ABLATION N/A 05/21/2020   Procedure: ATRIAL FIBRILLATION ABLATION;  Surgeon: Constance Haw, MD;  Location: [place] [facility];  Service: Cardiovascular;  Laterality: N/A;   CARDIOVERSION N/A  03/22/2018   Procedure: CARDIOVERSION;  Surgeon: Buford Dresser, MD;  Location: [facility] ENDOSCOPY;  Service: Cardiovascular;  Laterality: N/A;   CARDIOVERSION N/A 07/08/2020   Procedure: CARDIOVERSION;  Surgeon: Jerline Pain, MD;  Location: [facility] ENDOSCOPY;  Service: Cardiovascular;  Laterality: N/A;   HEMIARTHROPLASTY HIP  10/31/2013   IT HIP BIPOLAR   LUMBAR SPINE SURGERY     PACEMAKER PLACEMENT  01/29/2017   SHOULDER SURGERY Right      Current Outpatient Medications  Medication Sig Dispense Refill   amiodarone (PACERONE) 200 MG tablet Take 1 tablet (200 mg total) by mouth daily. 90 tablet 1   atorvastatin (LIPITOR) 10 MG tablet TAKE ONE TABLET BY MOUTH EVERY EVENING 90 tablet 2   carvedilol (COREG) 12.5 MG tablet Take 1 tablet (12.5 mg total) by mouth 2 (two) times daily. 180 tablet 1   famotidine (PEPCID) 40 MG tablet Take 1 tablet (40 mg total) by mouth daily. 90 tablet 0   FARXIGA 10 MG TABS tablet TAKE ONE TABLET BY MOUTH EVERY DAY 30 tablet 2   Finerenone 10 MG TABS Take 10 mg by mouth daily.     gabapentin (NEURONTIN) 300 MG capsule TAKE ONE CAPSULE BY MOUTH 3 TIMES DAILY 90 capsule 5   glipiZIDE (GLUCOTROL XL) 10 MG 24 hr tablet TAKE ONE TABLET BY MOUTH TWICE DAILY 180 tablet 1   levothyroxine (SYNTHROID) 137 MCG tablet Take 1 tablet (137 mcg total) by mouth every morning. 90 tablet 3   nitrofurantoin, macrocrystal-monohydrate, (MACROBID) 100 MG capsule Take 1 capsule (100 mg total) by mouth 2 (two) times daily. 20 capsule 0   potassium chloride (KLOR-CON) 10 MEQ tablet Take 10 mEq by mouth daily.     Semaglutide (OZEMPIC, 0.25 OR 0.5 MG/DOSE, [place]) Inject 0.25 mg into the skin once a week.     silver sulfADIAZINE (SILVADENE) 1 % cream Apply 1 application topically daily. 85 g 1   testosterone cypionate (DEPOTESTOSTERONE CYPIONATE) 200 MG/ML injection INJECT 1 MILLILITER IN THE MUSCLE EVERY 2 WEEKS 10 mL 1   XARELTO 20 MG TABS tablet TAKE ONE TABLET BY MOUTH EVERY EVENING 90  tablet 1   zolpidem (AMBIEN) 10 MG tablet Take 10 mg by mouth at bedtime as needed for sleep.     No current facility-administered medications for this visit.    Allergies:   Propranolol, Clonidine derivatives, Hydralazine hcl, Invokana [canagliflozin], Metformin and related, and Topamax [topiramate]   Social History:  The patient  reports that he has never smoked. He has never used smokeless tobacco. He reports that he does not currently use alcohol. He reports that he does not use drugs.   Family History:  The patient's family history includes Arthritis in an other family member; Congestive Heart Failure in an other family member; Diabetes type II in an other family member; Heart Problems in his father; Heart attack in his mother; Hyperlipidemia in an other family member; Transient ischemic attack in his mother; Tuberculosis in  his brother.   ROS:  Please see the history of present illness.   Otherwise, review of systems is positive for none.   All other systems are reviewed and negative.   PHYSICAL EXAM: VS:  BP (!) 142/84   Pulse 60   Ht 6\' 3"  (1.905 m)   Wt 244 lb 9.6 oz (110.9 kg)   SpO2 97%   BMI 30.57 kg/m  , BMI Body mass index is 30.57 kg/m. GEN: Well nourished, well developed, in no acute distress  HEENT: normal  Neck: no JVD, carotid bruits, or masses Cardiac: RRR; no murmurs, rubs, or gallops,no edema  Respiratory:  clear to auscultation bilaterally, normal work of breathing GI: soft, nontender, nondistended, + BS MS: no deformity or atrophy  Skin: warm and dry, device site well healed Neuro:  Strength and sensation are intact Psych: euthymic mood, full affect  EKG:  EKG is ordered today. Personal review of the ekg ordered shows AV paced  Personal review of the device interrogation today. Results in [place]: 09/30/2020: NT-Pro BNP 1,483 10/21/2020: TSH 3.640 04/26/2021: ALT 18; BUN 19; Creatinine, Ser 1.05; Hemoglobin 15.6; Platelets 167; Potassium  5.1; Sodium 141    Lipid Panel     Component Value Date/Time   CHOL 118 01/20/2021 0938   TRIG 70 01/20/2021 0938   HDL 37 (L) 01/20/2021 0938   CHOLHDL 3.2 01/20/2021 0938   LDLCALC 66 01/20/2021 0938     Wt Readings from Last 3 Encounters:  05/25/21 244 lb 9.6 oz (110.9 kg)  04/27/21 241 lb 3.2 oz (109.4 kg)  04/26/21 238 lb (108 kg)      Other studies Reviewed: Additional studies/ records that were reviewed today include: TTE 03/01/18  Review of the above records today demonstrates:  - Left ventricle: The cavity size was normal. Wall thickness was   increased in a pattern of mild LVH. Systolic function was mildly   reduced. The estimated ejection fraction was in the range of 45%   to 50%. Diffuse hypokinesis. - Left atrium: The atrium was mildly dilated. Volume/bsa, S: 37.6   ml/m^2. - Right ventricle: The cavity size was moderately dilated. Wall   thickness was normal. Pacer wire or catheter noted in right   ventricle.   ASSESSMENT AND PLAN:  1.  Persistent atrial fibrillation: Currently on Xarelto 20 mg, amiodarone 200 mg daily.  High risk medication monitoring for amiodarone.  Status post cryoablation in 2019.  CHA2DS2-VASc of 4.  Status post RF ablation 05/21/2020.  He is unfortunately had more frequent episodes of atrial fibrillation and atrial tachycardia.  Status post cardioversion but has had recurrence.  He has been normal rhythm for the last month.  He is currently feeling well.  2.  Hypertension:well controlled  3.  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  Device functioning appropriately.  No changes at this time.  4.  Obstructive sleep apnea: CPAP compliance encouraged  5.  Obesity: Diet and exercise encouraged  6.  Hyperlipidemia: Continue Lipitor per primary cardiology  Current medicines are reviewed at length with the patient today.   The patient does not have concerns regarding his medicines.  The  following changes were made today: none  Labs/ tests ordered today include:  Orders Placed This Encounter  Procedures   EKG 12-Lead     Disposition:   FU with Brynlee Pennywell 6 months  Signed, Loraine Bhullar [name], MD  05/25/2021 9:38 AM     [facility]  [address] [place] [address] [phone] (office) [phone] (fax)

## 2021-05-30 ENCOUNTER — Ambulatory Visit: Payer: Medicare Other | Admitting: Cardiology

## 2021-05-30 ENCOUNTER — Encounter: Payer: Self-pay | Admitting: Nurse Practitioner

## 2021-05-30 ENCOUNTER — Other Ambulatory Visit: Payer: Self-pay

## 2021-05-30 ENCOUNTER — Ambulatory Visit (INDEPENDENT_AMBULATORY_CARE_PROVIDER_SITE_OTHER): Payer: Medicare Other | Admitting: Nurse Practitioner

## 2021-05-30 VITALS — BP 132/72 | HR 67 | Temp 97.0°F | Ht 75.0 in | Wt 241.0 lb

## 2021-05-30 DIAGNOSIS — L97411 Non-pressure chronic ulcer of right heel and midfoot limited to breakdown of skin: Secondary | ICD-10-CM

## 2021-05-30 DIAGNOSIS — E08621 Diabetes mellitus due to underlying condition with foot ulcer: Secondary | ICD-10-CM | POA: Diagnosis not present

## 2021-05-30 NOTE — Progress Notes (Signed)
Acute Office Visit  Subjective:    Patient ID: Paul Kent, male    DOB: 1940-12-25, 80 y.o.   MRN: [id number]  Chief Complaint  Patient presents with   Diabetes   Chronic right heel wound    HPI: Paul Kent is an 80 year old Caucasian male that presents for evaluation of recurrent right heel diabetic foot ulcer. He is accompanied by his wife. Onset was approximately 47-month ago. Treatment has included Silvadene cream and covering with large band aid.Spouse states the wound began approximately 3-years ago that has healed intermittently. 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   Cellulitis of right lower limb    CHF (congestive heart failure) ([facility] 02/2018   Chronic atrial fibrillation (HCC)    Corns and callosities    Deviated septum 07/01/2018   Diabetes mellitus due to underlying condition with unspecified complications ([facility] 116/06/930  Disturbances of salivary secretion    Epistaxis    Essential hypertension    Essential tremor    H pylori ulcer    Herpes zoster with nervous system complication 135/57/3220  Hesitancy of micturition    Hyperlipidemia    Hypothyroidism    Impacted cerumen, bilateral    Jaw pain    Lesion of femoral nerve 04/30/2013   Localized edema    Male erectile disorder    Malignant neoplasm of overlapping sites of bladder ([facility]    Nasal congestion 07/01/2018   Nasal turbinate hypertrophy 07/01/2018   Obstructive sleep apnea 01/17/2017   Other constipation    Other fatigue    Overweight    Pacemaker reprogramming/check 02/12/2017   Pain in right finger(s)    Pain in right lower leg    Paroxysmal atrial fibrillation ([facility] 06/08/2016   Peptic ulcer  disease    Persistent atrial fibrillation ([facility] 06/01/2017   Primary insomnia    Renal stones    Renal stones    Secondary hypercoagulable state ([facility] 06/17/2020   Shingles 22542  WITH COMPLICATIONS (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)   Shingles 06/2012   Shortness of breath    Sick sinus syndrome ([facility]    Sinus bradycardia 03/07/2017   Sleep apnea    dx 08/2015, CPAP   Spontaneous ecchymoses    Status post ablation of atrial fibrillation 09/16/2020   Testicular hypofunction    Thoracic or lumbosacral neuritis or radiculitis 06/03/2013   Trigger middle finger of right hand 11/28/2019   Type 2 diabetes mellitus with circulatory disorder, 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HGy[place] 01/17/2017    Past Surgical History:  Procedure Laterality Date   ABLATION  06/2017   ATRIAL FIBRILLATION ABLATION N/A 05/21/2020   Procedure: ATRIAL FIBRILLATION ABLATION;  Surgeon: CConstance Haw MD;  [facility];  Service: Cardiovascular;  Laterality: N/A;   CARDIOVERSION N/A 03/22/2018   Procedure: CARDIOVERSION;  Surgeon: CBuford Dresser MD;  [facility]  Service: Cardiovascular;  Laterality: N/A;   CARDIOVERSION N/A 07/08/2020   Procedure: CARDIOVERSION;  Surgeon: SJerline Pain MD;  [facility];  Service: Cardiovascular;  Laterality: N/A;   HEMIARTHROPLASTY HIP  10/31/2013   IT HIP  BIPOLAR   LUMBAR SPINE SURGERY     PACEMAKER PLACEMENT  01/29/2017   SHOULDER SURGERY Right     Family History  Problem Relation Age of Onset   Heart attack Mother    Transient ischemic attack Mother    Heart Problems Father    Tuberculosis Brother    Diabetes type II Other    Hyperlipidemia Other    Congestive Heart Failure Other    Arthritis Other     Social History   Socioeconomic History   Marital status: Married    Spouse name: Paul Kent   Number of children: 3   Years of education: Not on file   Highest education  level: Not on file  Occupational History   Occupation: RETIRED    Comment: school principal  Tobacco Use   Smoking status: Never   Smokeless tobacco: Never  Vaping Use   Vaping Use: Never used  Substance and Sexual Activity   Alcohol use: Not Currently   Drug use: Never   Sexual activity: Not on file  Other Topics Concern   Not on file  Social History Narrative   Lives with wife   Social Determinants of Health   Financial Resource Strain: Not on file  Food Insecurity: Not on file  Transportation Needs: Not on file  Physical Activity: Not on file  Stress: Not on file  Social Connections: Not on file  Intimate Partner Violence: Not on file    Outpatient Medications Prior to Visit  Medication Sig Dispense Refill   amiodarone (PACERONE) 200 MG tablet Take 1 tablet (200 mg total) by mouth daily. 90 tablet 1   atorvastatin (LIPITOR) 10 MG tablet TAKE ONE TABLET BY MOUTH EVERY EVENING 90 tablet 2   carvedilol (COREG) 12.5 MG tablet Take 1 tablet (12.5 mg total) by mouth 2 (two) times daily. 180 tablet 1   famotidine (PEPCID) 40 MG tablet Take 1 tablet (40 mg total) by mouth daily. 90 tablet 0   FARXIGA 10 MG TABS tablet TAKE ONE TABLET BY MOUTH EVERY DAY 30 tablet 2   Finerenone 10 MG TABS Take 10 mg by mouth daily.     gabapentin (NEURONTIN) 300 MG capsule TAKE ONE CAPSULE BY MOUTH 3 TIMES DAILY 90 capsule 5   glipiZIDE (GLUCOTROL XL) 10 MG 24 hr tablet TAKE ONE TABLET BY MOUTH TWICE DAILY 180 tablet 1   levothyroxine (SYNTHROID) 137 MCG tablet Take 1 tablet (137 mcg total) by mouth every morning. 90 tablet 3   nitrofurantoin, macrocrystal-monohydrate, (MACROBID) 100 MG capsule Take 1 capsule (100 mg total) by mouth 2 (two) times daily. 20 capsule 0   potassium chloride (KLOR-CON) 10 MEQ tablet Take 10 mEq by mouth daily.     Semaglutide (OZEMPIC, 0.25 OR 0.5 MG/DOSE, [place]) Inject 0.25 mg into the skin once a week.     silver sulfADIAZINE (SILVADENE) 1 % cream Apply 1 application  topically daily. 85 g 1   testosterone cypionate (DEPOTESTOSTERONE CYPIONATE) 200 MG/ML injection INJECT 1 MILLILITER IN THE MUSCLE EVERY 2 WEEKS 10 mL 1   XARELTO 20 MG TABS tablet TAKE ONE TABLET BY MOUTH EVERY EVENING 90 tablet 1   zolpidem (AMBIEN) 10 MG tablet Take 10 mg by mouth at bedtime as needed for sleep.     No facility-administered medications prior to visit.    Allergies  Allergen Reactions   Propranolol     Drops HR too low    Clonidine Derivatives Nausea And Vomiting   Hydralazine Hcl  Patient had an adverse reaction    Invokana [Canagliflozin]     Patient had an adverse reaction   Metformin And Related Diarrhea   Topamax [Topiramate]     Patient had an adverse reaction    Review of Systems  Constitutional:  Negative for chills, diaphoresis, fatigue and fever.  HENT:  Negative for congestion, ear pain and sore throat.   Respiratory:  Negative for cough and shortness of breath.   Cardiovascular:  Negative for chest pain and leg swelling.  Musculoskeletal:  Negative for arthralgias and myalgias.  Skin:  Positive for wound (Rt posterior heel).      Objective:    Physical Exam Vitals reviewed.  Constitutional:      Appearance: Normal appearance.  Pulmonary:     Effort: Pulmonary effort is normal.  Musculoskeletal:        General: Normal range of motion.  Skin:    General: Skin is warm and dry.     Capillary Refill: Capillary refill takes less than 2 seconds.     Findings: Wound present.     Comments: Right heel wound approximately 3 mm in diameter without drainage, tender to touch  Neurological:     General: No focal deficit present.     Mental Status: He is alert and oriented to person, place, and time.  Psychiatric:        Mood and Affect: Mood normal.        Behavior: Behavior normal.   Pulse 67   Temp (!) 97 F (36.1 C)   Ht '6\' 3"'  (1.905 m)   Wt 241 lb (109.3 kg)   SpO2 97%   BMI 30.12 kg/m  BP 132/72   Pulse 67   Temp (!) 97 F  (36.1 C)   Ht '6\' 3"'  (1.905 m)   Wt 241 lb (109.3 kg)   SpO2 97%   BMI 30.12 kg/m   Wt Readings from Last 3 Encounters:  05/30/21 241 lb (109.3 kg)  05/25/21 244 lb 9.6 oz (110.9 kg)  04/27/21 241 lb 3.2 oz (109.4 kg)    Health Maintenance Due  Topic Date Due   OPHTHALMOLOGY EXAM  Never done   TETANUS/TDAP  Never done   COVID-19 Vaccine (4 - Booster for Moderna series) 12/07/2020    Lab Results  Component Value Date   TSH 3.640 10/21/2020   Lab Results  Component Value Date   WBC 4.2 04/26/2021   HGB 15.6 04/26/2021   HCT 48.9 04/26/2021   MCV 98 (H) 04/26/2021   PLT 167 04/26/2021   Lab Results  Component Value Date   NA 141 04/26/2021   K 5.1 04/26/2021   CO2 21 04/26/2021   GLUCOSE 130 (H) 04/26/2021   BUN 19 04/26/2021   CREATININE 1.05 04/26/2021   BILITOT 0.4 04/26/2021   ALKPHOS 99 04/26/2021   AST 25 04/26/2021   ALT 18 04/26/2021   PROT 7.3 04/26/2021   ALBUMIN 4.1 04/26/2021   CALCIUM 9.1 04/26/2021   EGFR 71 04/26/2021   Lab Results  Component Value Date   CHOL 118 01/20/2021   Lab Results  Component Value Date   HDL 37 (L) 01/20/2021   Lab Results  Component Value Date   LDLCALC 66 01/20/2021   Lab Results  Component Value Date   TRIG 70 01/20/2021   Lab Results  Component Value Date   CHOLHDL 3.2 01/20/2021   Lab Results  Component Value Date   HGBA1C 7.3 (H) 04/26/2021  Assessment & Plan:    1. 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  I, Rip Harbour, NP, have reviewed all documentation for this visit. The documentation on 05/30/21 for the exam, diagnosis, procedures, and orders are all accurate and  complete.   Rip Harbour, NP [place] [phone]

## 2021-06-02 ENCOUNTER — Other Ambulatory Visit: Payer: Self-pay | Admitting: Family Medicine

## 2021-06-02 DIAGNOSIS — I509 Heart failure, unspecified: Secondary | ICD-10-CM | POA: Diagnosis not present

## 2021-06-02 DIAGNOSIS — Z7901 Long term (current) use of anticoagulants: Secondary | ICD-10-CM | POA: Diagnosis not present

## 2021-06-02 DIAGNOSIS — N183 Chronic kidney disease, stage 3 unspecified: Secondary | ICD-10-CM | POA: Diagnosis not present

## 2021-06-02 DIAGNOSIS — L89613 Pressure ulcer of right heel, stage 3: Secondary | ICD-10-CM | POA: Diagnosis not present

## 2021-06-02 DIAGNOSIS — Z79899 Other long term (current) drug therapy: Secondary | ICD-10-CM | POA: Diagnosis not present

## 2021-06-02 DIAGNOSIS — I130 Hypertensive heart and chronic kidney disease with heart failure and stage 1 through stage 4 chronic kidney disease, or unspecified chronic kidney disease: Secondary | ICD-10-CM | POA: Diagnosis not present

## 2021-06-02 DIAGNOSIS — E1122 Type 2 diabetes mellitus with diabetic chronic kidney disease: Secondary | ICD-10-CM | POA: Diagnosis not present

## 2021-06-09 ENCOUNTER — Ambulatory Visit (INDEPENDENT_AMBULATORY_CARE_PROVIDER_SITE_OTHER): Payer: Medicare Other

## 2021-06-09 DIAGNOSIS — I495 Sick sinus syndrome: Secondary | ICD-10-CM | POA: Diagnosis not present

## 2021-06-09 DIAGNOSIS — E1122 Type 2 diabetes mellitus with diabetic chronic kidney disease: Secondary | ICD-10-CM | POA: Diagnosis not present

## 2021-06-09 DIAGNOSIS — I509 Heart failure, unspecified: Secondary | ICD-10-CM | POA: Diagnosis not present

## 2021-06-09 DIAGNOSIS — L89613 Pressure ulcer of right heel, stage 3: Secondary | ICD-10-CM | POA: Diagnosis not present

## 2021-06-09 DIAGNOSIS — Z7901 Long term (current) use of anticoagulants: Secondary | ICD-10-CM | POA: Diagnosis not present

## 2021-06-09 DIAGNOSIS — I130 Hypertensive heart and chronic kidney disease with heart failure and stage 1 through stage 4 chronic kidney disease, or unspecified chronic kidney disease: Secondary | ICD-10-CM | POA: Diagnosis not present

## 2021-06-09 DIAGNOSIS — N183 Chronic kidney disease, stage 3 unspecified: Secondary | ICD-10-CM | POA: Diagnosis not present

## 2021-06-09 LAB — CUP PACEART REMOTE DEVICE CHECK
Date Time Interrogation Session: 20221222074904
Implantable Lead Implant Date: 20180813
Implantable Lead Implant Date: 20180813
Implantable Lead Location: 753859
Implantable Lead Location: 753860
Implantable Lead Model: 377
Implantable Lead Model: 377
Implantable Lead Serial Number: 49893169
Implantable Lead Serial Number: 50011411
Implantable Pulse Generator Implant Date: 20180813
Pulse Gen Model: 407145
Pulse Gen Serial Number: 69158272

## 2021-06-14 ENCOUNTER — Other Ambulatory Visit: Payer: Self-pay

## 2021-06-14 MED ORDER — OZEMPIC (0.25 OR 0.5 MG/DOSE) 2 MG/1.5ML ~~LOC~~ SOPN: 0.2500 mg | PEN_INJECTOR | SUBCUTANEOUS | 0 refills | Status: DC

## 2021-06-16 DIAGNOSIS — E1122 Type 2 diabetes mellitus with diabetic chronic kidney disease: Secondary | ICD-10-CM | POA: Diagnosis not present

## 2021-06-16 DIAGNOSIS — Z7901 Long term (current) use of anticoagulants: Secondary | ICD-10-CM | POA: Diagnosis not present

## 2021-06-16 DIAGNOSIS — I130 Hypertensive heart and chronic kidney disease with heart failure and stage 1 through stage 4 chronic kidney disease, or unspecified chronic kidney disease: Secondary | ICD-10-CM | POA: Diagnosis not present

## 2021-06-16 DIAGNOSIS — L89613 Pressure ulcer of right heel, stage 3: Secondary | ICD-10-CM | POA: Diagnosis not present

## 2021-06-16 DIAGNOSIS — N183 Chronic kidney disease, stage 3 unspecified: Secondary | ICD-10-CM | POA: Diagnosis not present

## 2021-06-16 DIAGNOSIS — I509 Heart failure, unspecified: Secondary | ICD-10-CM | POA: Diagnosis not present

## 2021-06-17 ENCOUNTER — Other Ambulatory Visit: Payer: Self-pay | Admitting: Family Medicine

## 2021-06-17 ENCOUNTER — Other Ambulatory Visit: Payer: Self-pay | Admitting: Legal Medicine

## 2021-06-17 DIAGNOSIS — L97411 Non-pressure chronic ulcer of right heel and midfoot limited to breakdown of skin: Secondary | ICD-10-CM

## 2021-06-17 NOTE — Progress Notes (Signed)
Remote pacemaker transmission.   

## 2021-07-20 DIAGNOSIS — E785 Hyperlipidemia, unspecified: Secondary | ICD-10-CM | POA: Diagnosis not present

## 2021-07-20 DIAGNOSIS — I10 Essential (primary) hypertension: Secondary | ICD-10-CM | POA: Diagnosis not present

## 2021-07-20 DIAGNOSIS — Z79899 Other long term (current) drug therapy: Secondary | ICD-10-CM | POA: Diagnosis not present

## 2021-07-20 DIAGNOSIS — L89613 Pressure ulcer of right heel, stage 3: Secondary | ICD-10-CM | POA: Diagnosis not present

## 2021-07-20 DIAGNOSIS — E119 Type 2 diabetes mellitus without complications: Secondary | ICD-10-CM | POA: Diagnosis not present

## 2021-07-20 DIAGNOSIS — I4891 Unspecified atrial fibrillation: Secondary | ICD-10-CM | POA: Diagnosis not present

## 2021-07-20 DIAGNOSIS — G4730 Sleep apnea, unspecified: Secondary | ICD-10-CM | POA: Diagnosis not present

## 2021-07-20 DIAGNOSIS — Z7901 Long term (current) use of anticoagulants: Secondary | ICD-10-CM | POA: Diagnosis not present

## 2021-07-27 ENCOUNTER — Telehealth: Payer: Self-pay

## 2021-07-27 DIAGNOSIS — G4730 Sleep apnea, unspecified: Secondary | ICD-10-CM | POA: Diagnosis not present

## 2021-07-27 DIAGNOSIS — I10 Essential (primary) hypertension: Secondary | ICD-10-CM | POA: Diagnosis not present

## 2021-07-27 DIAGNOSIS — E119 Type 2 diabetes mellitus without complications: Secondary | ICD-10-CM | POA: Diagnosis not present

## 2021-07-27 DIAGNOSIS — L89613 Pressure ulcer of right heel, stage 3: Secondary | ICD-10-CM | POA: Diagnosis not present

## 2021-07-27 DIAGNOSIS — I4891 Unspecified atrial fibrillation: Secondary | ICD-10-CM | POA: Diagnosis not present

## 2021-07-27 DIAGNOSIS — E785 Hyperlipidemia, unspecified: Secondary | ICD-10-CM | POA: Diagnosis not present

## 2021-07-27 DIAGNOSIS — N401 Enlarged prostate with lower urinary tract symptoms: Secondary | ICD-10-CM | POA: Diagnosis not present

## 2021-07-27 DIAGNOSIS — Z8551 Personal history of malignant neoplasm of bladder: Secondary | ICD-10-CM | POA: Diagnosis not present

## 2021-07-27 NOTE — Telephone Encounter (Signed)
[facility] requesting at patient's visit a PSA be drawn.   Patient will be in office 2/10 for 3 month fasting.   Harrell Lark 07/27/21 4:23 PM

## 2021-07-28 ENCOUNTER — Encounter: Payer: Self-pay | Admitting: Cardiology

## 2021-07-28 ENCOUNTER — Other Ambulatory Visit: Payer: Self-pay

## 2021-07-28 ENCOUNTER — Ambulatory Visit (INDEPENDENT_AMBULATORY_CARE_PROVIDER_SITE_OTHER): Payer: Medicare Other | Admitting: Cardiology

## 2021-07-28 VITALS — BP 122/68 | HR 60 | Ht 75.0 in | Wt 246.8 lb

## 2021-07-28 DIAGNOSIS — E1159 Type 2 diabetes mellitus with other circulatory complications: Secondary | ICD-10-CM

## 2021-07-28 DIAGNOSIS — I480 Paroxysmal atrial fibrillation: Secondary | ICD-10-CM

## 2021-07-28 DIAGNOSIS — Z950 Presence of cardiac pacemaker: Secondary | ICD-10-CM | POA: Diagnosis not present

## 2021-07-28 DIAGNOSIS — Z8679 Personal history of other diseases of the circulatory system: Secondary | ICD-10-CM

## 2021-07-28 DIAGNOSIS — I10 Essential (primary) hypertension: Secondary | ICD-10-CM

## 2021-07-28 DIAGNOSIS — Z9889 Other specified postprocedural states: Secondary | ICD-10-CM | POA: Diagnosis not present

## 2021-07-28 NOTE — Progress Notes (Signed)
Cardiology Office Note:    Date:  07/28/2021   ID:  Paul Kent, Paul Kent [birth year], MRN [id number]  PCP:  [name], MD  Cardiologist:  Jenne Campus, MD    Referring MD: [name], MD   Chief Complaint  Patient presents with   Follow-up  Doing fine cardiac wise  History of Present Illness:    Paul Kent is a 81 y.o. male    with past medical history significant for paroxysmal/persistent atrial fibrillation, essential hypertension, hyperlipidemia, obstructive sleep apnea, type 2 diabetes, sick sinus syndrome, status post Biotronik pacemaker implantation.  Status post atrial fibrillation cryoablation done first time in January 2019 post ablation he was switched from flecainide to amiodarone he had repeated ablation done on 21 May 2020.  He comes today to my office in follow-up.  Cardiac wise doing well.  Denies of any palpitations, no chest pain, tightness, pressure, burning in the chest.  I did review interrogation of his device which showed last atrial fibrillation episode sometimes in October since that time during our activation of the problem.  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   Cellulitis of right lower limb    CHF (congestive heart failure) ([place]) 02/2018   Chronic atrial fibrillation ([place])    Corns and callosities    Deviated septum 07/01/2018   Diabetes mellitus due to underlying condition with unspecified complications ([place]) 01/30/4817   Disturbances of salivary secretion    Epistaxis    Essential hypertension    Essential tremor    H pylori ulcer    Herpes zoster with nervous system complication 56/31/4970   Hesitancy of micturition    Hyperlipidemia    Hypothyroidism    Impacted cerumen, bilateral    Jaw  pain    Lesion of femoral nerve 04/30/2013   Localized edema    Male erectile disorder    Malignant neoplasm of overlapping sites of bladder ([place])    Nasal congestion 07/01/2018   Nasal turbinate hypertrophy 07/01/2018   Obstructive sleep apnea 01/17/2017   Other constipation    Other fatigue    Overweight    Pacemaker reprogramming/check 02/12/2017   Pain in right finger(s)    Pain in right lower leg    Paroxysmal atrial fibrillation ([place]) 06/08/2016   Peptic ulcer disease    Persistent atrial fibrillation ([place]) 06/01/2017   Primary insomnia    Renal stones    Renal stones    Secondary hypercoagulable state ([place]) 06/17/2020   Shingles 2637   WITH COMPLICATIONS (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)   Shingles 06/2012   Shortness of breath    Sick sinus syndrome ([place])    Sinus bradycardia 03/07/2017   Sleep apnea    dx 08/2015, CPAP   Spontaneous ecchymoses    Status post ablation of atrial fibrillation 09/16/2020   Testicular hypofunction    Thoracic or lumbosacral neuritis or radiculitis 06/03/2013   Trigger middle finger of right hand 11/28/2019   Type 2 diabetes mellitus with circulatory disorder, 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[place]) 01/17/2017    Past Surgical History:  Procedure Laterality Date   ABLATION  06/2017   ATRIAL FIBRILLATION ABLATION N/A 05/21/2020   Procedure: ATRIAL  FIBRILLATION ABLATION;  Surgeon: Constance Haw, MD;  Location: [place] [facility];  Service: Cardiovascular;  Laterality: N/A;   CARDIOVERSION N/A 03/22/2018   Procedure: CARDIOVERSION;  Surgeon: Buford Dresser, MD;  Location[facility];  Service: Cardiovascular;  Laterality: N/A;   CARDIOVERSION N/A 07/08/2020   Procedure: CARDIOVERSION;  Surgeon: Jerline Pain, MD;  Location: [facility] ENDOSCOPY;  Service: Cardiovascular;  Laterality: N/A;   HEMIARTHROPLASTY HIP  10/31/2013   IT HIP BIPOLAR   LUMBAR SPINE SURGERY     PACEMAKER PLACEMENT   01/29/2017   SHOULDER SURGERY Right     Current Medications: Current Meds  Medication Sig   amiodarone (PACERONE) 200 MG tablet Take 1 tablet (200 mg total) by mouth daily.   atorvastatin (LIPITOR) 10 MG tablet TAKE ONE TABLET BY MOUTH EVERY EVENING (Patient taking differently: Take 10 mg by mouth daily.)   carvedilol (COREG) 12.5 MG tablet Take 1 tablet (12.5 mg total) by mouth 2 (two) times daily.   famotidine (PEPCID) 40 MG tablet Take 1 tablet (40 mg total) by mouth daily.   FARXIGA 10 MG TABS tablet TAKE ONE TABLET BY MOUTH EVERY DAY (Patient taking differently: Take 10 mg by mouth daily.)   Finerenone 10 MG TABS Take 10 mg by mouth daily.   gabapentin (NEURONTIN) 300 MG capsule TAKE ONE CAPSULE BY MOUTH 3 TIMES DAILY (Patient taking differently: Take 300 mg by mouth 3 (three) times daily.)   glipiZIDE (GLUCOTROL XL) 10 MG 24 hr tablet TAKE ONE TABLET BY MOUTH TWICE DAILY (Patient taking differently: Take 10 mg by mouth daily with breakfast.)   levothyroxine (SYNTHROID) 137 MCG tablet Take 1 tablet (137 mcg total) by mouth every morning.   Semaglutide,0.25 or 0.5MG /DOS, (OZEMPIC, 0.25 OR 0.5 MG/DOSE,) 2 MG/1.5ML SOPN Inject 0.25 mg into the skin once a week.   testosterone cypionate (DEPOTESTOSTERONE CYPIONATE) 200 MG/ML injection INJECT 1 MILLILITER IN THE MUSCLE EVERY 2 WEEKS (Patient taking differently: Inject 100 mg into the muscle every 14 (fourteen) days. INJECT 1 MILLILITER IN THE MUSCLE EVERY 2 WEEKS)   XARELTO 20 MG TABS tablet TAKE ONE TABLET BY MOUTH EVERY EVENING (Patient taking differently: Take 20 mg by mouth daily with supper.)     Allergies:   Propranolol, Clonidine derivatives, Hydralazine hcl, Invokana [canagliflozin], Metformin and related, and Topamax [topiramate]   Social History   Socioeconomic History   Marital status: Married    Spouse name: [name]   Number of children: 3   Years of education: Not on file   Highest education level: Not on file  Occupational  History   Occupation: RETIRED    Comment: school principal  Tobacco Use   Smoking status: Never   Smokeless tobacco: Never  Vaping Use   Vaping Use: Never used  Substance and Sexual Activity   Alcohol use: Not Currently   Drug use: Never   Sexual activity: Not on file  Other Topics Concern   Not on file  Social History Narrative   Lives with wife   Social Determinants of Health   Financial Resource Strain: Not on file  Food Insecurity: Not on file  Transportation Needs: Not on file  Physical Activity: Not on file  Stress: Not on file  Social Connections: Not on file     Family History: The patient's family history includes Arthritis in an other family member; Congestive Heart Failure in an other family member; Diabetes type II in an other family member; Heart Problems in his father; Heart attack in  his mother; Hyperlipidemia in an other family member; Transient ischemic attack in his mother; Tuberculosis in his brother. ROS:   Please see the history of present illness.    All 14 point review of systems negative except as described per history of present illness  EKGs/Labs/Other Studies Reviewed:      Recent Labs: 09/30/2020: NT-Pro BNP 1,483 10/21/2020: TSH 3.640 04/26/2021: ALT 18; BUN 19; Creatinine, Ser 1.05; Hemoglobin 15.6; Platelets 167; Potassium 5.1; Sodium 141  Recent Lipid Panel    Component Value Date/Time   CHOL 118 01/20/2021 0938   TRIG 70 01/20/2021 0938   HDL 37 (L) 01/20/2021 0938   CHOLHDL 3.2 01/20/2021 0938   LDLCALC 66 01/20/2021 0938    Physical Exam:    VS:  BP 122/68 (BP Location: Left Arm, Patient Position: Sitting)    Pulse 60    Ht 6\' 3"  (1.905 m)    Wt 246 lb 12.8 oz (111.9 kg)    SpO2 96%    BMI 30.85 kg/m     Wt Readings from Last 3 Encounters:  07/28/21 246 lb 12.8 oz (111.9 kg)  05/30/21 241 lb (109.3 kg)  05/25/21 244 lb 9.6 oz (110.9 kg)     GEN:  Well nourished, well developed in no acute distress HEENT: Normal NECK: No  JVD; No carotid bruits LYMPHATICS: No lymphadenopathy CARDIAC: RRR, no murmurs, no rubs, no gallops RESPIRATORY:  Clear to auscultation without rales, wheezing or rhonchi  ABDOMEN: Soft, non-tender, non-distended MUSCULOSKELETAL:  No edema; No deformity  SKIN: Warm and dry LOWER EXTREMITIES: no swelling NEUROLOGIC:  Alert and oriented x 3 PSYCHIATRIC:  Normal affect   ASSESSMENT:    1. Paroxysmal atrial fibrillation (HCC)   2. Cardiac pacemaker in situ   3. Status post ablation of atrial fibrillation   4. Type 2 diabetes mellitus with other circulatory complication, 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[place])   5. Essential hypertension    PLAN:    In order of problems listed above:  Paroxysmal atrial fibrillation.  Seems to maintain sinus rhythm.  She is on amiodarone 200 mg daily which I continue I wanted him to have liver function test as well as thyroid check today however he said that tomorrow he is going to his primary care physician who will do the test.  We will continue anticoagulation. Cardiopulmonary spica in situ I did review interrogations of Biotronik device normal function. Status post ablation of atrial fibrillation.  Noted. Type 2 diabetes.  Hemoglobin A1c last number I see is from November of last year shows 7.3.  Need to be better controlled. Dyslipidemia I did review his K PN which will be LDL of 66 HDL 37 this is from 01/20/2021.  He is on Lipitor 10 which I will continue. He does have nonhealing wound on his heel.  Apparently he did have some device for dropfoot put on that irritated he is healing and I am having also there.  Apparently circulation to his leg has been checked and is in the good he is follow-up wound care center.   Medication Adjustments/Labs and Tests Ordered: Current medicines are reviewed at length with the patient today.  Concerns regarding medicines are outlined above.  No orders of the defined types were placed in this  encounter.  Medication changes: No orders of the defined types were placed in this encounter.   Signed, Park Liter, MD, [facility] 07/28/2021 2:06 PM    [place]

## 2021-07-28 NOTE — Patient Instructions (Signed)

## 2021-07-29 ENCOUNTER — Encounter: Payer: Self-pay | Admitting: Family Medicine

## 2021-07-29 ENCOUNTER — Ambulatory Visit (INDEPENDENT_AMBULATORY_CARE_PROVIDER_SITE_OTHER): Payer: Medicare Other | Admitting: Family Medicine

## 2021-07-29 VITALS — BP 116/68 | HR 74 | Temp 97.4°F | Resp 16 | Ht 72.0 in | Wt 245.0 lb

## 2021-07-29 DIAGNOSIS — I482 Chronic atrial fibrillation, unspecified: Secondary | ICD-10-CM

## 2021-07-29 DIAGNOSIS — E08621 Diabetes mellitus due to underlying condition with foot ulcer: Secondary | ICD-10-CM

## 2021-07-29 DIAGNOSIS — K219 Gastro-esophageal reflux disease without esophagitis: Secondary | ICD-10-CM

## 2021-07-29 DIAGNOSIS — G5722 Lesion of femoral nerve, left lower limb: Secondary | ICD-10-CM

## 2021-07-29 DIAGNOSIS — N401 Enlarged prostate with lower urinary tract symptoms: Secondary | ICD-10-CM

## 2021-07-29 DIAGNOSIS — Z950 Presence of cardiac pacemaker: Secondary | ICD-10-CM

## 2021-07-29 DIAGNOSIS — E039 Hypothyroidism, unspecified: Secondary | ICD-10-CM | POA: Diagnosis not present

## 2021-07-29 DIAGNOSIS — L97411 Non-pressure chronic ulcer of right heel and midfoot limited to breakdown of skin: Secondary | ICD-10-CM

## 2021-07-29 DIAGNOSIS — D6869 Other thrombophilia: Secondary | ICD-10-CM | POA: Diagnosis not present

## 2021-07-29 DIAGNOSIS — G4733 Obstructive sleep apnea (adult) (pediatric): Secondary | ICD-10-CM

## 2021-07-29 DIAGNOSIS — E782 Mixed hyperlipidemia: Secondary | ICD-10-CM

## 2021-07-29 DIAGNOSIS — E1142 Type 2 diabetes mellitus with diabetic polyneuropathy: Secondary | ICD-10-CM

## 2021-07-29 DIAGNOSIS — F5101 Primary insomnia: Secondary | ICD-10-CM

## 2021-07-29 DIAGNOSIS — T50905A Adverse effect of unspecified drugs, medicaments and biological substances, initial encounter: Secondary | ICD-10-CM

## 2021-07-29 DIAGNOSIS — I5032 Chronic diastolic (congestive) heart failure: Secondary | ICD-10-CM | POA: Diagnosis not present

## 2021-07-29 DIAGNOSIS — N138 Other obstructive and reflux uropathy: Secondary | ICD-10-CM

## 2021-07-29 DIAGNOSIS — I10 Essential (primary) hypertension: Secondary | ICD-10-CM

## 2021-07-29 DIAGNOSIS — Z9989 Dependence on other enabling machines and devices: Secondary | ICD-10-CM

## 2021-07-29 DIAGNOSIS — G250 Essential tremor: Secondary | ICD-10-CM | POA: Diagnosis not present

## 2021-07-29 DIAGNOSIS — L97412 Non-pressure chronic ulcer of right heel and midfoot with fat layer exposed: Secondary | ICD-10-CM

## 2021-07-29 DIAGNOSIS — N1832 Chronic kidney disease, stage 3b: Secondary | ICD-10-CM

## 2021-07-29 DIAGNOSIS — I130 Hypertensive heart and chronic kidney disease with heart failure and stage 1 through stage 4 chronic kidney disease, or unspecified chronic kidney disease: Secondary | ICD-10-CM

## 2021-07-29 DIAGNOSIS — E291 Testicular hypofunction: Secondary | ICD-10-CM

## 2021-07-29 DIAGNOSIS — E1159 Type 2 diabetes mellitus with other circulatory complications: Secondary | ICD-10-CM | POA: Diagnosis not present

## 2021-07-29 DIAGNOSIS — J9611 Chronic respiratory failure with hypoxia: Secondary | ICD-10-CM | POA: Insufficient documentation

## 2021-07-29 MED ORDER — OZEMPIC (1 MG/DOSE) 4 MG/3ML ~~LOC~~ SOPN: 1.0000 mg | PEN_INJECTOR | SUBCUTANEOUS | 0 refills | Status: DC

## 2021-07-29 NOTE — Progress Notes (Signed)
Subjective:  Patient ID: Paul Kent, male    DOB: 1940-11-26  Age: 81 y.o. 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 On gabapentin 300 mg three times a day.  Range 130-220. Checking once daily In am. Patient has gained 10 lbs. Still battling ulcer in right heel. Seeing wound care. Has been over a year. Asking about hyperbaric oxygen chamber.  Hypertension: He takes Carvedilol 12.5 mg twice daily. 120/80s usually.  Afib: Taking Xarelto 20 mg daily, amiodarone 200 mg daily. Hyperlipidemia: He is on Atorvastatin 10 mg daily. Hypothyroidism: synthroid 137 mcg once daily in am. GERD: on pepcid 40 mg daily.  OSA/Chronic Respiratory Failure on 2 L O2 at night: has cpap with oxygen at 2 L. Patient is more rested. Has not had any flare ups of atrial fibrillation.  Still going to gym. Using recumbent bicycle.  History of Bladder Cancer: Follows with [facility] for routine cystoscopies for monitoring. NO recurrences.  Hypogonadism: on testosterone 100 mg Im every 14 days.    Current Outpatient Medications on File Prior to Visit  Medication Sig Dispense Refill   FARXIGA 10 MG TABS tablet TAKE ONE TABLET BY MOUTH EVERY DAY (Patient taking differently: Take 10 mg by mouth daily.) 90 tablet 1   gabapentin (NEURONTIN) 300 MG capsule TAKE ONE CAPSULE BY MOUTH 3 TIMES DAILY (Patient taking differently: Take 300 mg by mouth 3 (three) times daily.) 270 capsule 1   glipiZIDE (GLUCOTROL XL) 10 MG 24 hr tablet TAKE ONE TABLET BY MOUTH TWICE DAILY (Patient taking differently: Take 10 mg by mouth daily with breakfast.) 180 tablet 1   testosterone cypionate (DEPOTESTOSTERONE CYPIONATE) 200 MG/ML injection INJECT 1 MILLILITER IN THE MUSCLE EVERY 2 WEEKS (Patient taking  differently: Inject 100 mg into the muscle every 14 (fourteen) days. INJECT 1 MILLILITER IN THE MUSCLE EVERY 2 WEEKS) 10 mL 1   XARELTO 20 MG TABS tablet TAKE ONE TABLET BY MOUTH EVERY EVENING (Patient taking differently: Take 20 mg by mouth daily with supper.) 90 tablet 1   amiodarone (PACERONE) 200 MG tablet Take 1 tablet (200 mg total) by mouth daily. 90 tablet 1   carvedilol (COREG) 12.5 MG tablet Take 1 tablet (12.5 mg total) by mouth 2 (two) times daily. 180 tablet 1   famotidine (PEPCID) 40 MG tablet Take 1 tablet (40 mg total) by mouth daily. 90 tablet 0   Finerenone 10 MG TABS Take 10 mg by mouth daily.     levothyroxine (SYNTHROID) 137 MCG tablet Take 1 tablet (137 mcg total) by mouth every morning. 90 tablet 3   No current facility-administered medications on file prior to visit.   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   Cellulitis of right lower limb    CHF (congestive heart failure) ([place]) 02/2018   Chronic atrial fibrillation (HCC)    Corns and callosities    Deviated septum 07/01/2018   Deviated septum 07/01/2018   Diabetes mellitus due to underlying condition with  unspecified complications ([place]) 84/16/6063   Disturbances of salivary secretion    Epistaxis    Essential hypertension    Essential tremor    H pylori ulcer    H pylori ulcer    Herpes zoster with nervous system complication 01/60/1093   Hesitancy of micturition    Hyperlipidemia    Hypothyroidism    Impacted cerumen, bilateral    Jaw pain    Lesion of femoral nerve 04/30/2013   Localized edema    Male erectile disorder    Malignant neoplasm of overlapping sites of bladder ([place])    Nasal congestion 07/01/2018   Nasal turbinate hypertrophy 07/01/2018   Obstructive sleep  apnea 01/17/2017   Other constipation    Other fatigue    Overweight    Pacemaker reprogramming/check 02/12/2017   Pain in right finger(s)    Pain in right lower leg    Paroxysmal atrial fibrillation ([place]) 06/08/2016   Peptic ulcer disease    Persistent atrial fibrillation ([place]) 06/01/2017   Primary insomnia    Renal stones    Renal stones    Secondary hypercoagulable state ([place]) 06/17/2020   Shingles 2355   WITH COMPLICATIONS (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)   Shingles 06/2012   Shortness of breath    Sick sinus syndrome ([place])    Sinus bradycardia 03/07/2017   Sleep apnea    dx 08/2015, CPAP   Spontaneous ecchymoses    Status post ablation of atrial fibrillation 09/16/2020   Testicular hypofunction    Thoracic or lumbosacral neuritis or radiculitis 06/03/2013   Trigger middle finger of right hand 11/28/2019   Trigger middle finger of right hand 11/28/2019   Type 2 diabetes mellitus with circulatory disorder, 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[place]) 01/17/2017   Past Surgical History:  Procedure Laterality Date   ABLATION  06/2017   ATRIAL FIBRILLATION ABLATION N/A 05/21/2020   Procedure: ATRIAL FIBRILLATION ABLATION;  Surgeon: Constance Haw, MD;  Location: [place] [facility];  Service: Cardiovascular;  Laterality: N/A;   CARDIOVERSION N/A 03/22/2018   Procedure: CARDIOVERSION;  Surgeon: Buford Dresser, MD;  Location: [facility] ENDOSCOPY;  Service: Cardiovascular;  Laterality: N/A;   CARDIOVERSION N/A 07/08/2020   Procedure: CARDIOVERSION;  Surgeon: Jerline Pain, MD;  Location: [facility] ENDOSCOPY;  Service: Cardiovascular;  Laterality: N/A;   HEMIARTHROPLASTY HIP  10/31/2013   IT HIP BIPOLAR   LUMBAR SPINE SURGERY     PACEMAKER PLACEMENT  01/29/2017   SHOULDER SURGERY Right     Family History  Problem Relation Age of Onset   Heart attack Mother    Transient ischemic attack Mother    Heart Problems Father    Tuberculosis Brother     Diabetes type II Other    Hyperlipidemia Other    Congestive Heart Failure Other    Arthritis Other    Social History   Socioeconomic History   Marital status: Married    Spouse name: [name]   Number of children: 3   Years of education: Not on file   Highest education level: Not on file  Occupational History   Occupation: RETIRED    Comment: school principal  Tobacco Use   Smoking status: Never   Smokeless tobacco: Never  Vaping Use   Vaping Use: Never used  Substance and Sexual Activity   Alcohol use: Not Currently   Drug use: Never   Sexual activity: Not on file  Other Topics Concern   Not on file  Social History  Narrative   Lives with wife   Social Determinants of Health   Financial Resource Strain: Not on file  Food Insecurity: Not on file  Transportation Needs: Not on file  Physical Activity: Not on file  Stress: Not on file  Social Connections: Not on file    Review of Systems  Constitutional:  Negative for chills, fatigue, fever and unexpected weight change.  HENT:  Negative for congestion, ear pain, sinus pain and sore throat.   Respiratory:  Negative for cough and shortness of breath.   Cardiovascular:  Negative for chest pain and palpitations.  Gastrointestinal:  Negative for abdominal pain, blood in stool, constipation, diarrhea, nausea and vomiting.  Endocrine: Negative for polydipsia.  Genitourinary:  Negative for dysuria.  Musculoskeletal:  Negative for back pain.  Skin:  Positive for wound (right foot). Negative for rash.  Neurological:  Negative for headaches.       Balance issues   Psychiatric/Behavioral:  Negative for dysphoric mood. The patient is not nervous/anxious.     Objective:  BP 116/68    Pulse 74    Temp (!) 97.4 F (36.3 C)    Resp 16    Ht 6' (1.829 m)    Wt 245 lb (111.1 kg)    BMI 33.23 kg/m   BP/Weight 07/29/2021 07/28/2021 45/80/9983  Systolic BP 382 505 397  Diastolic BP 68 68 72  Wt. (Lbs) 245 246.8 241  BMI 33.23 30.85  30.12    Physical Exam Vitals reviewed.  Constitutional:      Appearance: Normal appearance.  Neck:     Vascular: No carotid bruit.  Cardiovascular:     Rate and Rhythm: Normal rate and regular rhythm.     Pulses: Normal pulses.     Heart sounds: Normal heart sounds.  Pulmonary:     Effort: Pulmonary effort is normal.     Breath sounds: Normal breath sounds. No wheezing, rhonchi or rales.  Abdominal:     General: Bowel sounds are normal.     Palpations: Abdomen is soft.     Tenderness: There is no abdominal tenderness.  Musculoskeletal:     Right lower leg: No edema.     Left lower leg: No edema.  Neurological:     Mental Status: He is alert and oriented to person, place, and time.  Psychiatric:        Mood and Affect: Mood normal.        Behavior: Behavior normal.    Diabetic Foot Exam - Simple   Simple Foot Form Diabetic Foot exam was performed with the following findings: Yes 07/29/2021  9:18 AM  Visual Inspection See comments: Yes Sensation Testing See comments: Yes Pulse Check Posterior Tibialis and Dorsalis pulse intact bilaterally: Yes Comments Ulcer stage 2 on posterior right heel. Improving very slowly.  Poor sensation BL feet.  Onychomycosis.       Lab Results  Component Value Date   WBC 4.2 04/26/2021   HGB 15.6 04/26/2021   HCT 48.9 04/26/2021   PLT 167 04/26/2021   GLUCOSE 130 (H) 04/26/2021   CHOL 118 01/20/2021   TRIG 70 01/20/2021   HDL 37 (L) 01/20/2021   LDLCALC 66 01/20/2021   ALT 18 04/26/2021   AST 25 04/26/2021   NA 141 04/26/2021   K 5.1 04/26/2021   CL 104 04/26/2021   CREATININE 1.05 04/26/2021   BUN 19 04/26/2021   CO2 21 04/26/2021   TSH 3.640 10/21/2020   HGBA1C 7.3 (H) 04/26/2021  MICROALBUR 80 01/20/2021      Assessment & Plan:   Problem List Items Addressed This Visit       Cardiovascular and Mediastinum   RESOLVED: Type 2 diabetes mellitus with circulatory disorder, without long-term current use of insulin  (HCC) - Primary   Relevant Medications   Semaglutide, 1 MG/DOSE, (OZEMPIC, 1 MG/DOSE,) 4 MG/3ML SOPN   atorvastatin (LIPITOR) 10 MG tablet   Other Relevant Orders   Hemoglobin A1c   Microalbumin / creatinine urine ratio   RESOLVED: Essential hypertension   Relevant Medications   atorvastatin (LIPITOR) 10 MG tablet   Other Relevant Orders   Comprehensive metabolic panel   CBC with Differential/Platelet   Chronic atrial fibrillation (HCC)    Rate controlled . Management per specialist. Sees Dr. Curt Bears. Continue xarelto 20 mg daily, amiodarone 200 mg daily, and carvedilol 12.5 mg one twice daily.       Relevant Medications   atorvastatin (LIPITOR) 10 MG tablet   RESOLVED: CHF (congestive heart failure) (HCC)   Relevant Medications   atorvastatin (LIPITOR) 10 MG tablet   Hypertensive heart and renal disease, stage 1-4 or unspecified chronic kidney disease, with heart failure ([place])    The current medical regimen is effective;  continue present plan and medications. Stable, Continue carvedilol.       Relevant Medications   atorvastatin (LIPITOR) 10 MG tablet     Respiratory   OSA on CPAP    Continue cpap.  Compliant with cpap use.  Patient benefits from use of cpap.       Chronic respiratory failure with hypoxia (HCC)    Improved on O2 2L at night.        Digestive   Gastroesophageal reflux disease without esophagitis    The current medical regimen is effective;  continue present plan and medications. Continue pepcid 40 mg daily.         Endocrine   Hypothyroidism    The current medical regimen is effective;  continue present plan and medications.       Relevant Orders   TSH   Testicular hypofunction    The current medical regimen is effective;  continue present plan and medications.       Relevant Orders   Testosterone,Free and Total   Diabetic polyneuropathy associated with type 2 diabetes mellitus ([place])    Control: Await labs/testing for assessment  and recommendations. Recommend check sugars fasting daily. Recommend check feet daily. Recommend annual eye exams. Medicines: Recommend continue to work on eating healthy diet and exercise. Increase ozempic to 1 mg weekly. If sugar drops, stop glipizide. This will help with weight loss.  Continue to work on eating a healthy diet and exercise.  Labs drawn today.         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       Relevant Medications   Semaglutide, 1 MG/DOSE, (OZEMPIC, 1 MG/DOSE,) 4 MG/3ML SOPN   atorvastatin (LIPITOR) 10 MG tablet     Nervous and Auditory   Essential tremor    On carvedilol. Previously on propranolol but was changed by cardiology at some point.       Lesion of femoral nerve    Chronic issue.         Genitourinary  Chronic kidney disease, stage 3b (HCC)    Stable.       Enlarged prostate with urinary obstruction    Check psa level      Relevant Orders   PSA     Hematopoietic and Hemostatic   Acquired thrombophilia ([place])    Secondary to xarelto.        Other   Primary insomnia    Continue ambien.       Hyperlipidemia    Well controlled.  No changes to medicines. Continue lipitor 10 mg daily.  Continue to work on eating a healthy diet and exercise.  Labs drawn today.        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 March Rummage.  Patient will consider referral to [facility] for possible hyperbaric oxygen chamber.       Cardiac pacemaker in situ  .  Meds ordered this encounter  Medications   Semaglutide, 1 MG/DOSE, (OZEMPIC, 1 MG/DOSE,) 4 MG/3ML SOPN    Sig:  Inject 1 mg into the skin once a week.    Dispense:  9 mL    Refill:  0   atorvastatin (LIPITOR) 10 MG tablet    Sig: Take 1 tablet (10 mg total) by mouth every evening.    Dispense:  90 tablet    Refill:  2    This prescription was filled on 06/02/2021. Any refills authorized will be placed on file.    Orders Placed This Encounter  Procedures   Comprehensive metabolic panel   Hemoglobin A1c   Lipid panel   CBC with Differential/Platelet   Microalbumin / creatinine urine ratio   TSH   Testosterone,Free and Total   PSA     Follow-up: Return in about 3 months (around 10/26/2021) for chronic fasting.  An After Visit Summary was printed and given to the patient.  [name], MD [facility] [phone]

## 2021-07-29 NOTE — Patient Instructions (Signed)
Recommend continue to work on eating healthy diet and exercise. Increase ozempic to 1 mg weekly. If sugar drops, stop glipizide.  Consider referral to wound care center in [facility] for hyperbaric oxygen chamber treatments.

## 2021-07-31 ENCOUNTER — Encounter: Payer: Self-pay | Admitting: Family Medicine

## 2021-07-31 DIAGNOSIS — I130 Hypertensive heart and chronic kidney disease with heart failure and stage 1 through stage 4 chronic kidney disease, or unspecified chronic kidney disease: Secondary | ICD-10-CM | POA: Insufficient documentation

## 2021-07-31 DIAGNOSIS — E08621 Diabetes mellitus due to underlying condition with foot ulcer: Secondary | ICD-10-CM

## 2021-07-31 DIAGNOSIS — N138 Other obstructive and reflux uropathy: Secondary | ICD-10-CM | POA: Insufficient documentation

## 2021-07-31 DIAGNOSIS — L97411 Non-pressure chronic ulcer of right heel and midfoot limited to breakdown of skin: Secondary | ICD-10-CM | POA: Insufficient documentation

## 2021-07-31 DIAGNOSIS — N401 Enlarged prostate with lower urinary tract symptoms: Secondary | ICD-10-CM | POA: Insufficient documentation

## 2021-07-31 HISTORY — DX: Non-pressure chronic ulcer of right heel and midfoot limited to breakdown of skin: E08.621

## 2021-07-31 MED ORDER — ATORVASTATIN CALCIUM 10 MG PO TABS: 10.0000 mg | ORAL_TABLET | Freq: Every evening | ORAL | 2 refills | Status: DC

## 2021-07-31 NOTE — Assessment & Plan Note (Signed)
The current medical regimen is effective;  continue present plan and medications. Stable, Continue carvedilol.

## 2021-07-31 NOTE — Assessment & Plan Note (Signed)
The current medical regimen is effective;  continue present plan and medications.  

## 2021-07-31 NOTE — Assessment & Plan Note (Signed)
Secondary to xarelto.   

## 2021-07-31 NOTE — Assessment & Plan Note (Signed)
On carvedilol. Previously on propranolol but was changed by cardiology at some point.

## 2021-07-31 NOTE — Assessment & Plan Note (Signed)
Continued treatment with Dr. March Rummage.  Patient will consider referral to [facility] for possible hyperbaric oxygen chamber.

## 2021-07-31 NOTE — Assessment & Plan Note (Signed)
Control: Await labs/testing for assessment and recommendations. Recommend check sugars fasting daily. Recommend check feet daily. Recommend annual eye exams. Medicines: Recommend continue to work on eating healthy diet and exercise. Increase ozempic to 1 mg weekly. If sugar drops, stop glipizide. This will help with weight loss.  Continue to work on eating a healthy diet and exercise.  Labs drawn today.

## 2021-07-31 NOTE — Assessment & Plan Note (Signed)
Rate controlled . Management per specialist. Sees Dr. Curt Bears. Continue xarelto 20 mg daily, amiodarone 200 mg daily, and carvedilol 12.5 mg one twice daily.

## 2021-07-31 NOTE — Assessment & Plan Note (Signed)
Chronic issue.  

## 2021-07-31 NOTE — Assessment & Plan Note (Signed)
See above

## 2021-07-31 NOTE — Assessment & Plan Note (Signed)
Continue ambien

## 2021-07-31 NOTE — Assessment & Plan Note (Signed)
Well controlled.  No changes to medicines. Continue lipitor 10 mg daily.  Continue to work on eating a healthy diet and exercise.  Labs drawn today.

## 2021-07-31 NOTE — Assessment & Plan Note (Signed)
Stable

## 2021-07-31 NOTE — Assessment & Plan Note (Signed)
The current medical regimen is effective;  continue present plan and medications. 

## 2021-07-31 NOTE — Assessment & Plan Note (Signed)
Improved on O2 2L at night.

## 2021-07-31 NOTE — Assessment & Plan Note (Signed)
Check psa level.  

## 2021-07-31 NOTE — Assessment & Plan Note (Signed)
Continue cpap.  Compliant with cpap use.  Patient benefits from use of cpap.

## 2021-08-03 ENCOUNTER — Other Ambulatory Visit: Payer: Self-pay | Admitting: Family Medicine

## 2021-08-03 DIAGNOSIS — I4891 Unspecified atrial fibrillation: Secondary | ICD-10-CM | POA: Diagnosis not present

## 2021-08-03 DIAGNOSIS — G4730 Sleep apnea, unspecified: Secondary | ICD-10-CM | POA: Diagnosis not present

## 2021-08-03 DIAGNOSIS — E119 Type 2 diabetes mellitus without complications: Secondary | ICD-10-CM | POA: Diagnosis not present

## 2021-08-03 DIAGNOSIS — K219 Gastro-esophageal reflux disease without esophagitis: Secondary | ICD-10-CM

## 2021-08-03 DIAGNOSIS — L89613 Pressure ulcer of right heel, stage 3: Secondary | ICD-10-CM | POA: Diagnosis not present

## 2021-08-03 DIAGNOSIS — I10 Essential (primary) hypertension: Secondary | ICD-10-CM | POA: Diagnosis not present

## 2021-08-03 DIAGNOSIS — E785 Hyperlipidemia, unspecified: Secondary | ICD-10-CM | POA: Diagnosis not present

## 2021-08-03 NOTE — Telephone Encounter (Signed)
Refill sent to pharmacy.   

## 2021-08-04 LAB — CARDIOVASCULAR RISK ASSESSMENT

## 2021-08-04 LAB — CBC WITH DIFFERENTIAL/PLATELET
Basophils Absolute: 0.1 10*3/uL (ref 0.0–0.2)
Basos: 2 %
EOS (ABSOLUTE): 0.1 10*3/uL (ref 0.0–0.4)
Eos: 3 %
Hematocrit: 49.1 % (ref 37.5–51.0)
Hemoglobin: 16.3 g/dL (ref 13.0–17.7)
Immature Grans (Abs): 0 10*3/uL (ref 0.0–0.1)
Immature Granulocytes: 0 %
Lymphocytes Absolute: 0.9 10*3/uL (ref 0.7–3.1)
Lymphs: 23 %
MCH: 32.7 pg (ref 26.6–33.0)
MCHC: 33.2 g/dL (ref 31.5–35.7)
MCV: 99 fL — ABNORMAL HIGH (ref 79–97)
Monocytes Absolute: 0.6 10*3/uL (ref 0.1–0.9)
Monocytes: 15 %
Neutrophils Absolute: 2.4 10*3/uL (ref 1.4–7.0)
Neutrophils: 57 %
Platelets: 133 10*3/uL — ABNORMAL LOW (ref 150–450)
RBC: 4.98 x10E6/uL (ref 4.14–5.80)
RDW: 13.7 % (ref 11.6–15.4)
WBC: 4.1 10*3/uL (ref 3.4–10.8)

## 2021-08-04 LAB — COMPREHENSIVE METABOLIC PANEL
ALT: 16 IU/L (ref 0–44)
AST: 20 IU/L (ref 0–40)
Albumin/Globulin Ratio: 1.5 (ref 1.2–2.2)
Albumin: 3.9 g/dL (ref 3.6–4.6)
Alkaline Phosphatase: 107 IU/L (ref 44–121)
BUN/Creatinine Ratio: 11 (ref 10–24)
BUN: 21 mg/dL (ref 8–27)
Bilirubin Total: 0.9 mg/dL (ref 0.0–1.2)
CO2: 22 mmol/L (ref 20–29)
Calcium: 8.7 mg/dL (ref 8.6–10.2)
Chloride: 106 mmol/L (ref 96–106)
Creatinine, Ser: 1.83 mg/dL — ABNORMAL HIGH (ref 0.76–1.27)
Globulin, Total: 2.6 g/dL (ref 1.5–4.5)
Glucose: 157 mg/dL — ABNORMAL HIGH (ref 70–99)
Potassium: 5.1 mmol/L (ref 3.5–5.2)
Sodium: 142 mmol/L (ref 134–144)
Total Protein: 6.5 g/dL (ref 6.0–8.5)
eGFR: 36 mL/min/{1.73_m2} — ABNORMAL LOW (ref >59)

## 2021-08-04 LAB — LIPID PANEL
Chol/HDL Ratio: 2.6 ratio (ref 0.0–5.0)
Cholesterol, Total: 103 mg/dL (ref 100–199)
HDL: 40 mg/dL (ref >39)
LDL Chol Calc (NIH): 47 mg/dL (ref 0–99)
Triglycerides: 79 mg/dL (ref 0–149)
VLDL Cholesterol Cal: 16 mg/dL (ref 5–40)

## 2021-08-04 LAB — PSA: Prostate Specific Ag, Serum: 2.1 ng/mL (ref 0.0–4.0)

## 2021-08-04 LAB — HEMOGLOBIN A1C
Est. average glucose Bld gHb Est-mCnc: 183 mg/dL
Hgb A1c MFr Bld: 8 % — ABNORMAL HIGH (ref 4.8–5.6)

## 2021-08-04 LAB — MICROALBUMIN / CREATININE URINE RATIO
Creatinine, Urine: 66 mg/dL
Microalb/Creat Ratio: 85 mg/g creat — ABNORMAL HIGH (ref 0–29)
Microalbumin, Urine: 56 ug/mL

## 2021-08-04 LAB — TSH: TSH: 4.99 u[IU]/mL — ABNORMAL HIGH (ref 0.450–4.500)

## 2021-08-04 LAB — TESTOSTERONE,FREE AND TOTAL
Testosterone, Free: 13 pg/mL (ref 6.6–18.1)
Testosterone: 836 ng/dL (ref 264–916)

## 2021-08-05 NOTE — Progress Notes (Signed)
Blood count normal.  Liver function normal.  Kidney function abnormal. Spilling protein in urine. I believe kidney doctor started him on kerendia already. Forward labs.  Thyroid function abnormal. Recommend increase synthroid to 150 mcg once daily in am.  Cholesterol: good.  HBA1C: 8. Increased ozempic to 1 mg weekly.  Testosterone levels and psa levels good.

## 2021-08-08 ENCOUNTER — Other Ambulatory Visit: Payer: Self-pay

## 2021-08-08 MED ORDER — LEVOTHYROXINE SODIUM 150 MCG PO TABS: 150.0000 ug | ORAL_TABLET | Freq: Every day | ORAL | 1 refills | Status: DC

## 2021-08-10 DIAGNOSIS — I4891 Unspecified atrial fibrillation: Secondary | ICD-10-CM | POA: Diagnosis not present

## 2021-08-10 DIAGNOSIS — E119 Type 2 diabetes mellitus without complications: Secondary | ICD-10-CM | POA: Diagnosis not present

## 2021-08-10 DIAGNOSIS — I10 Essential (primary) hypertension: Secondary | ICD-10-CM | POA: Diagnosis not present

## 2021-08-10 DIAGNOSIS — L89613 Pressure ulcer of right heel, stage 3: Secondary | ICD-10-CM | POA: Diagnosis not present

## 2021-08-10 DIAGNOSIS — G4730 Sleep apnea, unspecified: Secondary | ICD-10-CM | POA: Diagnosis not present

## 2021-08-10 DIAGNOSIS — E785 Hyperlipidemia, unspecified: Secondary | ICD-10-CM | POA: Diagnosis not present

## 2021-08-12 ENCOUNTER — Encounter: Payer: Self-pay | Admitting: Sports Medicine

## 2021-08-12 ENCOUNTER — Ambulatory Visit (INDEPENDENT_AMBULATORY_CARE_PROVIDER_SITE_OTHER): Payer: Medicare Other | Admitting: Sports Medicine

## 2021-08-12 DIAGNOSIS — E1159 Type 2 diabetes mellitus with other circulatory complications: Secondary | ICD-10-CM

## 2021-08-12 DIAGNOSIS — L97411 Non-pressure chronic ulcer of right heel and midfoot limited to breakdown of skin: Secondary | ICD-10-CM | POA: Diagnosis not present

## 2021-08-12 DIAGNOSIS — M21371 Foot drop, right foot: Secondary | ICD-10-CM

## 2021-08-12 NOTE — Progress Notes (Signed)
Subjective: Paul Kent is a 81 y.o. male patient seen in office for evaluation of ulceration of the good the right heel.  Patient reports he is going to the wound care center weekly and they have been putting honey on it.  Patient reports that is not draining anymore and less sore.  States that his blood sugar this morning was 102 and his last A1c was 8.  Patient denies numbness tingling burning or any other constitutional symptoms at this time.  Patient does report that he has been struggling with this ulcer for over 2 years secondary to a brace that rub the foot that Dr. March Rummage had made for him because of the dropfoot and states that he has lost many nights of sleep because of concern for this wound.  Patient denies any other pedal complaints at this time.  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 ([place]) 04/26/2021   Diabetic polyneuropathy associated with type 2 diabetes mellitus ([place]) 04/26/2021   Acquired thrombophilia ([place]) 04/26/2021   Status post ablation of atrial fibrillation 09/16/2020   Testicular hypofunction    Primary insomnia    Overweight    Male erectile disorder    Hyperlipidemia    Corns and callosities    Chronic atrial fibrillation (HCC)    History of bladder cancer    Arthralgia of left temporomandibular joint    Acquired bilateral hammer toes    Nasal turbinate hypertrophy 07/01/2018   Jaw pain 06/18/2017    Hypothyroidism 03/07/2017   Pacemaker reprogramming/check 02/12/2017   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   OSA on CPAP 01/17/2017   Sick sinus syndrome ([place]) 01/17/2017   Essential tremor 06/03/2013   Herpes zoster with nervous system complication 99/37/1696   Thoracic or lumbosacral neuritis or radiculitis 06/03/2013   Lesion of femoral nerve 04/30/2013   Current Outpatient Medications on File Prior to Visit  Medication Sig Dispense Refill   amiodarone (PACERONE) 200 MG tablet Take 1 tablet (200 mg total) by mouth daily. 90 tablet 1   amoxicillin (AMOXIL) 500 MG capsule SMARTSIG:4 Capsule(s) By Mouth     amoxicillin-clavulanate (AUGMENTIN) 500-125 MG tablet Take 1 tablet by mouth 2 (two) times daily.     atorvastatin (LIPITOR) 10 MG tablet Take 1 tablet (10 mg total) by mouth every evening. 90 tablet 2   carvedilol (COREG) 12.5 MG tablet Take 1 tablet (12.5 mg total) by mouth 2 (two) times daily. 180 tablet 1   famotidine (PEPCID) 40 MG tablet Take 1 tablet (40 mg total) by mouth daily. 90 tablet 0   FARXIGA 10 MG TABS tablet TAKE ONE TABLET BY MOUTH EVERY DAY (Patient taking differently: Take 10 mg by mouth daily.) 90 tablet 1   Finerenone 10 MG TABS Take 10 mg by mouth daily.     gabapentin (NEURONTIN) 300 MG capsule TAKE ONE CAPSULE BY MOUTH 3 TIMES DAILY (Patient taking differently: Take 300 mg by mouth 3 (three) times daily.) 270 capsule 1   glipiZIDE (GLUCOTROL  XL) 10 MG 24 hr tablet TAKE ONE TABLET BY MOUTH TWICE DAILY (Patient taking differently: Take 10 mg by mouth daily with breakfast.) 180 tablet 1   levothyroxine (SYNTHROID) 150 MCG tablet Take 1 tablet (150 mcg total) by mouth daily. 90 tablet 1   Semaglutide, 1 MG/DOSE, (OZEMPIC, 1 MG/DOSE,) 4 MG/3ML SOPN Inject 1 mg into the skin once a week. 9 mL 0   testosterone cypionate (DEPOTESTOSTERONE CYPIONATE) 200 MG/ML injection INJECT 1 MILLILITER IN THE MUSCLE EVERY 2 WEEKS (Patient taking differently: Inject 100 mg into the  muscle every 14 (fourteen) days. INJECT 1 MILLILITER IN THE MUSCLE EVERY 2 WEEKS) 10 mL 1   XARELTO 20 MG TABS tablet TAKE ONE TABLET BY MOUTH EVERY EVENING (Patient taking differently: Take 20 mg by mouth daily with supper.) 90 tablet 1   No current facility-administered medications on file prior to visit.   Allergies  Allergen Reactions   Propranolol     Drops HR too low    Clonidine Derivatives Nausea And Vomiting   Hydralazine Hcl     Patient had an adverse reaction    Invokana [Canagliflozin]     Patient had an adverse reaction   Metformin And Related Diarrhea   Topamax [Topiramate]     Patient had an adverse reaction    Recent Results (from the past 2160 hour(s))  POCT urinalysis dipstick     Status: Abnormal   Collection Time: 05/16/21  1:07 PM  Result Value Ref Range   Color, UA     Clarity, UA     Glucose, UA Positive (A) Negative   Bilirubin, UA negative    Ketones, UA negative    Spec Grav, UA 1.020 1.010 - 1.025   Blood, UA 3+    pH, UA 5.5 5.0 - 8.0   Protein, UA Positive (A) Negative   Urobilinogen, UA 0.2 0.2 or 1.0 E.U./dL   Nitrite, UA negative    Leukocytes, UA Negative Negative   Appearance     Odor    CUP PACEART REMOTE DEVICE CHECK     Status: None   Collection Time: 06/09/21  7:49 AM  Result Value Ref Range   Pulse Generator Manufacturer BITR    Date Time Interrogation Session [id number]    Pulse Gen Model 248250 Edora 8 DR-T    Pulse Gen Serial Number [id number]    Clinic Name [facility]    Implantable Pulse Generator Type Implantable Pulse Generator    Implantable Pulse Generator Implant Date [id number]    Implantable Lead Manufacturer BITR    Implantable Lead Model 377 177 [address] S 53    Implantable Lead Serial Number [id number]    Implantable Lead Implant Date [id number]    Implantable Lead Location Detail 1 UNKNOWN    Implantable Lead Location [id number]    Implantable Lead Manufacturer BITR    Implantable Lead Model 377 179 [address]ia S 60     Implantable Lead Serial Number [id number]    Implantable Lead Implant Date [id number]    Implantable Lead Location Detail 1 UNKNOWN    Implantable Lead Location [id number]   Comprehensive metabolic panel     Status: Abnormal   Collection Time: 07/29/21  9:21 AM  Result Value Ref Range   Glucose 157 (H) 70 - 99 mg/dL   BUN 21 8 - 27 mg/dL   Creatinine, Ser 1.83 (H) 0.76 - 1.27 mg/dL   eGFR 36 (L) >59 mL/min/1.73   BUN/Creatinine Ratio 11 10 - 24  Sodium 142 134 - 144 mmol/L   Potassium 5.1 3.5 - 5.2 mmol/L   Chloride 106 96 - 106 mmol/L   CO2 22 20 - 29 mmol/L   Calcium 8.7 8.6 - 10.2 mg/dL   Total Protein 6.5 6.0 - 8.5 g/dL   Albumin 3.9 3.6 - 4.6 g/dL   Globulin, Total 2.6 1.5 - 4.5 g/dL   Albumin/Globulin Ratio 1.5 1.2 - 2.2   Bilirubin Total 0.9 0.0 - 1.2 mg/dL   Alkaline Phosphatase 107 44 - 121 IU/L   AST 20 0 - 40 IU/L   ALT 16 0 - 44 IU/L  Hemoglobin A1c     Status: Abnormal   Collection Time: 07/29/21  9:21 AM  Result Value Ref Range   Hgb A1c MFr Bld 8.0 (H) 4.8 - 5.6 %    Comment:          Prediabetes: 5.7 - 6.4          Diabetes: >6.4          Glycemic control for adults with diabetes: <7.0    Est. average glucose Bld gHb Est-mCnc 183 mg/dL  Lipid panel     Status: None   Collection Time: 07/29/21  9:21 AM  Result Value Ref Range   Cholesterol, Total 103 100 - 199 mg/dL   Triglycerides 79 0 - 149 mg/dL   HDL 40 >39 mg/dL   VLDL Cholesterol Cal 16 5 - 40 mg/dL   LDL Chol Calc (NIH) 47 0 - 99 mg/dL   Chol/HDL Ratio 2.6 0.0 - 5.0 ratio    Comment:                                   T. Chol/HDL Ratio                                             Men  Women                               1/2 Avg.Risk  3.4    3.3                                   Avg.Risk  5.0    4.4                                2X Avg.Risk  9.6    7.1                                3X Avg.Risk 23.4   11.0   CBC with Differential/Platelet     Status: Abnormal   Collection Time: 07/29/21  9:21 AM   Result Value Ref Range   WBC 4.1 3.4 - 10.8 x10E3/uL   RBC 4.98 4.14 - 5.80 x10E6/uL   Hemoglobin 16.3 13.0 - 17.7 g/dL   Hematocrit 49.1 37.5 - 51.0 %   MCV 99 (H) 79 - 97 fL   MCH 32.7 26.6 - 33.0 pg   MCHC 33.2 31.5 - 35.7 g/dL   RDW 13.7 11.6 -  15.4 %   Platelets 133 (L) 150 - 450 x10E3/uL   Neutrophils 57 Not Estab. %   Lymphs 23 Not Estab. %   Monocytes 15 Not Estab. %   Eos 3 Not Estab. %   Basos 2 Not Estab. %   Neutrophils Absolute 2.4 1.4 - 7.0 x10E3/uL   Lymphocytes Absolute 0.9 0.7 - 3.1 x10E3/uL   Monocytes Absolute 0.6 0.1 - 0.9 x10E3/uL   EOS (ABSOLUTE) 0.1 0.0 - 0.4 x10E3/uL   Basophils Absolute 0.1 0.0 - 0.2 x10E3/uL   Immature Granulocytes 0 Not Estab. %   Immature Grans (Abs) 0.0 0.0 - 0.1 x10E3/uL  Microalbumin / creatinine urine ratio     Status: Abnormal   Collection Time: 07/29/21  9:21 AM  Result Value Ref Range   Creatinine, Urine 66.0 Not Estab. mg/dL   Microalbumin, Urine 56.0 Not Estab. ug/mL   Microalb/Creat Ratio 85 (H) 0 - 29 mg/g creat    Comment:                        Normal:                0 -  29                        Moderately increased: 30 - 300                        Severely increased:       >300   TSH     Status: Abnormal   Collection Time: 07/29/21  9:21 AM  Result Value Ref Range   TSH 4.990 (H) 0.450 - 4.500 uIU/mL  Testosterone,Free and Total     Status: None   Collection Time: 07/29/21  9:21 AM  Result Value Ref Range   Testosterone 836 264 - 916 ng/dL    Comment: Adult male reference interval is based on a population of healthy nonobese males (BMI <30) between 80 and 28 years old. [place], [place] [phone]. PMID: [id number].    Testosterone, Free 13.0 6.6 - 18.1 pg/mL  PSA     Status: None   Collection Time: 07/29/21  9:21 AM  Result Value Ref Range   Prostate Specific Ag, Serum 2.1 0.0 - 4.0 ng/mL    Comment: Roche ECLIA methodology. According to the American Urological Association, Serum PSA  should decrease and remain at undetectable levels after radical prostatectomy. The AUA defines biochemical recurrence as an initial PSA value 0.2 ng/mL or greater followed by a subsequent confirmatory PSA value 0.2 ng/mL or greater. Values obtained with different assay methods or kits cannot be used interchangeably. Results cannot be interpreted as absolute evidence of the presence or absence of malignant disease.   Cardiovascular Risk Assessment     Status: None   Collection Time: 07/29/21  9:21 AM  Result Value Ref Range   Interpretation Note     Comment: Supplemental report is available.    Objective: There were no vitals filed for this visit.  General: Patient is awake, alert, oriented x 3 and in no acute distress.  Dermatology: Skin is warm and dry bilateral with a partial thickness ulceration present plantar posterior right heel ulceration measures 1.7 cm x 0.8 cm x 0.1 cm. There is a minimal keratotic border with a granular base. The ulceration does notprobe to bone. There is no malodor, no active drainage, no erythema, no edema. No  acute signs of infection.   Vascular: Dorsalis Pedis pulse = 1/4 Bilateral,  Posterior Tibial pulse =1 /4 Bilateral,  Capillary Fill Time < 5 seconds  Neurologic: Gross sensation present via light touch.  Musculosketal: There is minimal pain to palpation to ulceration plantar posterior right heel.  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   No results for input(s): GRAMSTAIN, LABORGA in the last 8760 hours.  Assessment and Plan:  Problem List Items Addressed This Visit   None Visit Diagnoses     Ulcer of heel, right, limited to breakdown of skin ([place])    -  Primary   Right foot drop       Type 2 diabetes mellitus with other circulatory complication, 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HCC)            -Examined patient and discussed the progression of the wound and treatment alternatives. - Excisionally dedbrided ulceration at right plantar  posterior heel to healthy bleeding borders removing nonviable tissue using a sterile chisel blade. Wound measures post debridement as above.  Wound was debrided to the level of the dermis with viable wound base exposed to promote healing. Hemostasis was achieved with manuel pressure. Patient tolerated procedure well without any discomfort or anesthesia necessary for this wound debridement.  -Applied Medihoney and dry sterile dressing and instructed patient to continue with daily dressings at home consisting of same daily with help from wife -Applied U-shaped offloading padding tissue and gave patient extra to replace as needed on at least 1 monthly basis - Advised patient to go to the ER or return to office if the wound worsens or if constitutional symptoms are present. -Patient to continue with [facility] wound care center follow-up weekly return to office if wound fails to continue to improve  Landis Martins, DPM

## 2021-08-14 ENCOUNTER — Other Ambulatory Visit: Payer: Self-pay | Admitting: Family Medicine

## 2021-08-17 DIAGNOSIS — L89613 Pressure ulcer of right heel, stage 3: Secondary | ICD-10-CM | POA: Diagnosis not present

## 2021-08-19 ENCOUNTER — Other Ambulatory Visit: Payer: Self-pay

## 2021-08-19 MED ORDER — CARVEDILOL 12.5 MG PO TABS: 12.5000 mg | ORAL_TABLET | Freq: Two times a day (BID) | ORAL | 2 refills | Status: DC

## 2021-08-22 DIAGNOSIS — G8929 Other chronic pain: Secondary | ICD-10-CM | POA: Diagnosis not present

## 2021-08-22 DIAGNOSIS — M25512 Pain in left shoulder: Secondary | ICD-10-CM | POA: Diagnosis not present

## 2021-08-24 DIAGNOSIS — L89613 Pressure ulcer of right heel, stage 3: Secondary | ICD-10-CM | POA: Diagnosis not present

## 2021-08-31 DIAGNOSIS — L89613 Pressure ulcer of right heel, stage 3: Secondary | ICD-10-CM | POA: Diagnosis not present

## 2021-09-07 DIAGNOSIS — L89613 Pressure ulcer of right heel, stage 3: Secondary | ICD-10-CM | POA: Diagnosis not present

## 2021-09-08 ENCOUNTER — Ambulatory Visit (INDEPENDENT_AMBULATORY_CARE_PROVIDER_SITE_OTHER): Payer: Medicare Other

## 2021-09-08 ENCOUNTER — Telehealth: Payer: Self-pay | Admitting: Family Medicine

## 2021-09-08 DIAGNOSIS — I495 Sick sinus syndrome: Secondary | ICD-10-CM

## 2021-09-08 DIAGNOSIS — I480 Paroxysmal atrial fibrillation: Secondary | ICD-10-CM

## 2021-09-08 LAB — CUP PACEART REMOTE DEVICE CHECK
Date Time Interrogation Session: 20230323064841
Implantable Lead Implant Date: 20180813
Implantable Lead Implant Date: 20180813
Implantable Lead Location: 753859
Implantable Lead Location: 753860
Implantable Lead Model: 377
Implantable Lead Model: 377
Implantable Lead Serial Number: 49893169
Implantable Lead Serial Number: 50011411
Implantable Pulse Generator Implant Date: 20180813
Pulse Gen Model: 407145
Pulse Gen Serial Number: 69158272

## 2021-09-08 NOTE — Chronic Care Management (AMB) (Signed)
?  Chronic Care Management  ? ?Note ? ?09/08/2021 ?Name: Paul Kent MRN: [id number] DOB: 1941/03/09 ? ?Paul Kent is a 81 y.o. year old male who is a primary care patient of Cox, Kirsten, MD. I reached out to [name] by phone today in response to a referral sent by Paul Kent's PCP, [name], MD.  ? ?Paul Kent was given information about Chronic Care Management services today including:  ?CCM service includes personalized support from designated clinical staff supervised by his physician, including individualized plan of care and coordination with other care providers ?24/7 contact phone numbers for assistance for urgent and routine care needs. ?Service will only be billed when office clinical staff spend 20 minutes or more in a month to coordinate care. ?Only one practitioner may furnish and bill the service in a calendar month. ?The patient may stop CCM services at any time (effective at the end of the month) by phone call to the office staff. ? ? ?Patient agreed to services and verbal consent obtained.  ? ?Follow up plan: ? ? ?Tatjana Dellinger ?Upstream Scheduler  ?

## 2021-09-13 DIAGNOSIS — M25562 Pain in left knee: Secondary | ICD-10-CM | POA: Diagnosis not present

## 2021-09-14 DIAGNOSIS — L89613 Pressure ulcer of right heel, stage 3: Secondary | ICD-10-CM | POA: Diagnosis not present

## 2021-09-15 NOTE — Progress Notes (Signed)
Remote pacemaker transmission.   

## 2021-09-17 ENCOUNTER — Other Ambulatory Visit: Payer: Self-pay | Admitting: Family Medicine

## 2021-09-20 ENCOUNTER — Telehealth: Payer: Self-pay

## 2021-09-20 ENCOUNTER — Telehealth: Payer: Medicare Other

## 2021-09-20 NOTE — Telephone Encounter (Signed)
Patient is unsure if he would like to proceed with DCCV. He would like to speak with his wife who is not home at this time but would like a call back. Would also like to know when he went back into AF when someone returns call. ?

## 2021-09-20 NOTE — Telephone Encounter (Signed)
Biotronik alert received for increase in AF burden per trends. ? ?Patient reports of fatigue and sleeping more often. Denies any other symptoms. Reports compliance with medication on file and doses. Routing with Dr. Curt Bears and Dr. Agustin Cree for review and recommendations. ? ? ? ?

## 2021-09-21 DIAGNOSIS — L89613 Pressure ulcer of right heel, stage 3: Secondary | ICD-10-CM | POA: Diagnosis not present

## 2021-09-21 NOTE — Telephone Encounter (Signed)
Spoke with patient informed him that he had went back into AF persistently around first of Feburary, patient stated that he has had 4 cardioversion and an AF ablation and still he goes into AF patient is still undecided about going through with another cardioversion. Informed patient that is ok the most important with AF is keep taking the xarelto patient stated that he does not miss a dose of xarelto. Patient asked a number to be sent to him through MyChart for which he could call back.  ?

## 2021-09-29 ENCOUNTER — Other Ambulatory Visit: Payer: Self-pay

## 2021-09-29 ENCOUNTER — Other Ambulatory Visit: Payer: Self-pay | Admitting: Legal Medicine

## 2021-09-29 ENCOUNTER — Telehealth: Payer: Self-pay

## 2021-09-29 DIAGNOSIS — R230 Cyanosis: Secondary | ICD-10-CM

## 2021-09-29 DIAGNOSIS — L89613 Pressure ulcer of right heel, stage 3: Secondary | ICD-10-CM | POA: Diagnosis not present

## 2021-09-29 DIAGNOSIS — I739 Peripheral vascular disease, unspecified: Secondary | ICD-10-CM

## 2021-09-29 NOTE — Telephone Encounter (Signed)
Patient was seen today at the wound care center.  Paul Felix, NP called to request we order vascular imaging to bilateral lower extremities due to a noticeable change in color.  She noted his left foot was more purple today than before.  Patient's last OV with Dr Tobie Poet was 07/29/21.  Please advise and add order. ?

## 2021-09-29 NOTE — Telephone Encounter (Signed)
Scheduled for arterial dopplers LE ?lp ?

## 2021-09-29 NOTE — Telephone Encounter (Signed)
Order is in.

## 2021-10-04 DIAGNOSIS — I739 Peripheral vascular disease, unspecified: Secondary | ICD-10-CM | POA: Diagnosis not present

## 2021-10-05 DIAGNOSIS — L89613 Pressure ulcer of right heel, stage 3: Secondary | ICD-10-CM | POA: Diagnosis not present

## 2021-10-06 ENCOUNTER — Other Ambulatory Visit: Payer: Self-pay

## 2021-10-06 DIAGNOSIS — I739 Peripheral vascular disease, unspecified: Secondary | ICD-10-CM

## 2021-10-06 NOTE — Progress Notes (Signed)
ABIs normal ?lp

## 2021-10-12 DIAGNOSIS — L89613 Pressure ulcer of right heel, stage 3: Secondary | ICD-10-CM | POA: Diagnosis not present

## 2021-10-13 DIAGNOSIS — H109 Unspecified conjunctivitis: Secondary | ICD-10-CM | POA: Diagnosis not present

## 2021-10-15 ENCOUNTER — Other Ambulatory Visit: Payer: Self-pay | Admitting: Family Medicine

## 2021-10-17 ENCOUNTER — Other Ambulatory Visit: Payer: Self-pay | Admitting: Family Medicine

## 2021-10-17 DIAGNOSIS — K219 Gastro-esophageal reflux disease without esophagitis: Secondary | ICD-10-CM

## 2021-10-19 DIAGNOSIS — L89613 Pressure ulcer of right heel, stage 3: Secondary | ICD-10-CM | POA: Diagnosis not present

## 2021-10-28 ENCOUNTER — Other Ambulatory Visit: Payer: Self-pay | Admitting: Family Medicine

## 2021-10-28 DIAGNOSIS — E1159 Type 2 diabetes mellitus with other circulatory complications: Secondary | ICD-10-CM

## 2021-10-31 ENCOUNTER — Telehealth: Payer: Self-pay

## 2021-10-31 NOTE — Telephone Encounter (Signed)
Alert received for long atrial episode. ? ?Pt previously contacted in April 2023 and advised of increase in atrial fibrillation.  Pt was symptomatic at that time. ? ?Pt referred to afib clinic in April 2023 to schedule DCCV but refused. ? ?Outreach made to Pt.  Per Pt he has not noticed that he has been in atrial fibrillation.  He denies any symptoms.  States he has felt well. ?He confirms he is taking amiodarone and Xarelto. ? ?F/u appt scheduled with Dr. Curt Bears for December 12, 2021 at 2:15 pm. ? ? ?

## 2021-11-01 ENCOUNTER — Ambulatory Visit (INDEPENDENT_AMBULATORY_CARE_PROVIDER_SITE_OTHER): Payer: Medicare Other | Admitting: Family Medicine

## 2021-11-01 VITALS — BP 110/64 | HR 60 | Temp 97.2°F | Resp 16 | Ht 72.0 in | Wt 229.0 lb

## 2021-11-01 DIAGNOSIS — I482 Chronic atrial fibrillation, unspecified: Secondary | ICD-10-CM | POA: Diagnosis not present

## 2021-11-01 DIAGNOSIS — E08621 Diabetes mellitus due to underlying condition with foot ulcer: Secondary | ICD-10-CM

## 2021-11-01 DIAGNOSIS — D6869 Other thrombophilia: Secondary | ICD-10-CM | POA: Diagnosis not present

## 2021-11-01 DIAGNOSIS — K219 Gastro-esophageal reflux disease without esophagitis: Secondary | ICD-10-CM

## 2021-11-01 DIAGNOSIS — E782 Mixed hyperlipidemia: Secondary | ICD-10-CM | POA: Diagnosis not present

## 2021-11-01 DIAGNOSIS — I1310 Hypertensive heart and chronic kidney disease without heart failure, with stage 1 through stage 4 chronic kidney disease, or unspecified chronic kidney disease: Secondary | ICD-10-CM | POA: Diagnosis not present

## 2021-11-01 DIAGNOSIS — N1832 Chronic kidney disease, stage 3b: Secondary | ICD-10-CM | POA: Diagnosis not present

## 2021-11-01 DIAGNOSIS — I130 Hypertensive heart and chronic kidney disease with heart failure and stage 1 through stage 4 chronic kidney disease, or unspecified chronic kidney disease: Secondary | ICD-10-CM

## 2021-11-01 DIAGNOSIS — I10 Essential (primary) hypertension: Secondary | ICD-10-CM | POA: Diagnosis not present

## 2021-11-01 DIAGNOSIS — E1159 Type 2 diabetes mellitus with other circulatory complications: Secondary | ICD-10-CM

## 2021-11-01 DIAGNOSIS — Z9989 Dependence on other enabling machines and devices: Secondary | ICD-10-CM

## 2021-11-01 DIAGNOSIS — L97411 Non-pressure chronic ulcer of right heel and midfoot limited to breakdown of skin: Secondary | ICD-10-CM

## 2021-11-01 DIAGNOSIS — E1142 Type 2 diabetes mellitus with diabetic polyneuropathy: Secondary | ICD-10-CM

## 2021-11-01 DIAGNOSIS — E291 Testicular hypofunction: Secondary | ICD-10-CM | POA: Diagnosis not present

## 2021-11-01 DIAGNOSIS — J9611 Chronic respiratory failure with hypoxia: Secondary | ICD-10-CM

## 2021-11-01 DIAGNOSIS — E039 Hypothyroidism, unspecified: Secondary | ICD-10-CM | POA: Diagnosis not present

## 2021-11-01 DIAGNOSIS — G4733 Obstructive sleep apnea (adult) (pediatric): Secondary | ICD-10-CM | POA: Diagnosis not present

## 2021-11-01 DIAGNOSIS — Z950 Presence of cardiac pacemaker: Secondary | ICD-10-CM

## 2021-11-01 NOTE — Progress Notes (Signed)
? ?Subjective:  ?Patient ID: Paul Kent, male    DOB: [birth year]  Age: 81 y.o. MRN: [id number] ? ?Chief Complaint  ?Patient presents with  ? Diabetes  ? ? ?HPI ?Diabetes complicated by polyneuropathy, ulcer of right foot, CKD stage 3b: Patient is taking Farxiga 10 mg daily, Glipizide 10 mg twice daily. Stopped Ozempic 1 mg weekly (had to stop due to bowel upset.). On gabapentin 300 mg three times a day.  Range 130-200.  Checking once daily In am. Ulcer on right heel has improved. Seeing wound care once a week.  ?Hypertension: He takes Carvedilol 12.5 mg twice daily. 120/80s usually.  ?Afib: Taking Xarelto 20 mg daily, amiodarone 200 mg daily. Cannot tell he is on atrial fibrillation. ?Hyperlipidemia: He is on Atorvastatin 10 mg daily. ?Hypothyroidism: synthroid 150 mcg once daily in am. ?GERD: on pepcid 40 mg daily.  ?OSA/Chronic Respiratory Failure on 2 L O2 at night: has cpap with oxygen at 2 L. Patient feels better. Sleeping better. Feels more rested. Off ambien due to severe reaction. ?Still going to gym. Using recumbent bicycle.  ?History of Bladder Cancer: Follows with [facility] for routine cystoscopies for monitoring. NO recurrences.  ?Hypogonadism: on testosterone 200 mg Im every 14 days.  ? ?Above reviewed and updated.  ? ?Current Outpatient Medications on File Prior to Visit  ?Medication Sig Dispense Refill  ? amiodarone (PACERONE) 200 MG tablet TAKE ONE TABLET BY MOUTH EVERY DAY 90 tablet 1  ? atorvastatin (LIPITOR) 10 MG tablet Take 1 tablet (10 mg total) by mouth every evening. 90 tablet 2  ? carvedilol (COREG) 12.5 MG tablet Take 1 tablet (12.5 mg total) by mouth 2 (two) times daily. 180 tablet 2  ? famotidine (PEPCID) 40 MG tablet Take 1 tablet (40 mg total) by mouth daily. 90 tablet 0  ? FARXIGA 10 MG TABS tablet TAKE ONE TABLET BY MOUTH EVERY DAY (Patient taking differently: Take 10 mg by mouth daily.) 90 tablet 1  ? gabapentin (NEURONTIN) 300 MG capsule TAKE ONE CAPSULE BY MOUTH  3 TIMES DAILY (Patient taking differently: Take 300 mg by mouth 3 (three) times daily.) 270 capsule 1  ? glipiZIDE (GLUCOTROL XL) 10 MG 24 hr tablet TAKE ONE TABLET BY MOUTH TWICE DAILY 180 tablet 1  ? levothyroxine (SYNTHROID) 150 MCG tablet Take 1 tablet (150 mcg total) by mouth daily. 90 tablet 1  ? testosterone cypionate (DEPOTESTOSTERONE CYPIONATE) 200 MG/ML injection INJECT 1 MILLILITER IN THE MUSCLE EVERY 2 WEEKS (Patient taking differently: Inject 100 mg into the muscle every 14 (fourteen) days. INJECT 1 MILLILITER IN THE MUSCLE EVERY 2 WEEKS) 10 mL 1  ? XARELTO 20 MG TABS tablet TAKE ONE TABLET BY MOUTH EVERY EVENING (Patient taking differently: Take 20 mg by mouth daily with supper.) 90 tablet 1  ? ?No current facility-administered medications on file prior to visit.  ? ?Past Medical History:  ?Diagnosis Date  ? Acquired bilateral hammer toes   ? Arthralgia of left temporomandibular joint   ? Atrial fibrillation ([place]) 05/2016  ? Bladder cancer ([place])   ? Bradycardia   ? LOW HEART RATE  ? Calculus of ureter   ? 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  ? Cellulitis of right lower limb   ? CHF (congestive heart failure) ([place]) 02/2018  ? Chronic atrial fibrillation ([facility])   ? Corns and callosities   ? Deviated septum 07/01/2018  ? Deviated septum 07/01/2018  ? Diabetes mellitus due to underlying condition with unspecified complications ([place]) 33/29/5188  ?  Disturbances of salivary secretion   ? Epistaxis   ? Essential hypertension   ? Essential tremor   ? H pylori ulcer   ? H pylori ulcer   ? Herpes zoster with nervous system complication 04/12/8526  ? Hesitancy of micturition   ? Hyperlipidemia   ? Hypothyroidism   ? Impacted cerumen, bilateral   ? Jaw pain   ? Lesion of femoral nerve 04/30/2013  ? Localized edema   ? Male erectile disorder   ? Malignant neoplasm of overlapping sites of bladder [facility])   ? Nasal congestion 07/01/2018  ? Nasal turbinate hypertrophy 07/01/2018  ? Obstructive sleep apnea 01/17/2017  ?  Other constipation   ? Other fatigue   ? Overweight   ? Pacemaker reprogramming/check 02/12/2017  ? Pain in right finger(s)   ? Pain in right lower leg   ? Paroxysmal atrial fibrillation ([place]) 06/08/2016  ? Peptic ulcer disease   ? Persistent atrial fibrillation ([place]) 06/01/2017  ? Primary insomnia   ? Renal stones   ? Renal stones   ? Secondary hypercoagulable state ([place]) 06/17/2020  ? Shingles 2014  ? 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 Shingles 06/2012  ? Shortness of breath   ? Sick sinus syndrome ([place])   ? Sinus bradycardia 03/07/2017  ? Sleep apnea   ? dx 08/2015, CPAP  ? Spontaneous ecchymoses   ? Status post ablation of atrial fibrillation 09/16/2020  ? Testicular hypofunction   ? Thoracic or lumbosacral neuritis or radiculitis 06/03/2013  ? Trigger middle finger of right hand 11/28/2019  ? Trigger middle finger of right hand 11/28/2019  ? Type 2 diabetes mellitus with circulatory disorder, 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[place]) 01/17/2017  ? ?Past Surgical History:  ?Procedure Laterality Date  ? ABLATION  06/2017  ? ATRIAL FIBRILLATION ABLATION N/A 05/21/2020  ? Procedure: ATRIAL FIBRILLATION ABLATION;  Surgeon: Constance Haw, MD;  Location: [facility];  Service: Cardiovascular;  Laterality: N/A;  ? CARDIOVERSION N/A 03/22/2018  ? Procedure: CARDIOVERSION;  Surgeon: Buford Dresser, MD;  Location: [place];  Service: Cardiovascular;  Laterality: N/A;  ? CARDIOVERSION N/A 07/08/2020  ? Procedure: CARDIOVERSION;  Surgeon: Jerline Pain, MD;  Location: [facility];  Service: Cardiovascular;  Laterality: N/A;  ? HEMIARTHROPLASTY HIP  10/31/2013  ? IT HIP BIPOLAR  ? LUMBAR SPINE SURGERY    ? PACEMAKER PLACEMENT  01/29/2017  ? SHOULDER SURGERY Right   ?  ?Family History  ?Problem Relation Age of Onset  ? Heart attack Mother   ? Transient ischemic attack Mother   ? Heart Problems Father   ? Tuberculosis Brother   ? Diabetes type II  Other   ? Hyperlipidemia Other   ? Congestive Heart Failure Other   ? Arthritis Other   ? ?Social History  ? ?Socioeconomic History  ? Marital status: Married  ?  Spouse name: [name]  ? Number of children: 3  ? Years of education: Not on file  ? Highest education level: Not on file  ?Occupational History  ? 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 Smoking status: Never  ? Smokeless tobacco: Never  ?Vaping Use  ? Vaping Use: Never used  ?Substance and Sexual Activity  ? Alcohol use: Not Currently  ? Drug use: Never  ? Sexual activity: Not on file  ?Other Topics Concern  ? Not on file  ?Social History Narrative  ? Lives with wife  ? ?  Social Determinants of Health  ? ?Financial Resource Strain: Not on file  ?Food Insecurity: Not on file  ?Transportation Needs: Not on file  ?Physical Activity: Not on file  ?Stress: Not on file  ?Social Connections: Not on file  ? ? ?Review of Systems  ?Constitutional:  Negative for chills and fever.  ?HENT:  Negative for congestion, rhinorrhea and sore throat.   ?Respiratory:  Negative for cough and shortness of breath.   ?Cardiovascular:  Negative for chest pain and palpitations.  ?Gastrointestinal:  Negative for abdominal pain, constipation, diarrhea, nausea and vomiting.  ?Genitourinary:  Negative for dysuria and urgency.  ?Musculoskeletal:  Negative for arthralgias, back pain and myalgias.  ?Skin:  Positive for wound (right heel pain).  ?Neurological:  Positive for weakness (left leg). Negative for dizziness and headaches.  ?Psychiatric/Behavioral:  Negative for dysphoric mood. The patient is not nervous/anxious.   ? ? ?Objective:  ?BP 110/64   Pulse 60   Temp (!) 97.2 ?F (36.2 ?C)   Resp 16   Ht 6' (1.829 m)   Wt 229 lb (103.9 kg)   BMI 31.06 kg/m?  ? ? ?  11/01/2021  ?  8:20 AM 07/29/2021  ?  8:23 AM 07/28/2021  ?  1:53 PM  ?BP/Weight  ?Systolic BP 122 482 500  ?Diastolic BP 64 68 68  ?Wt. (Lbs) 229 245 246.8  ?BMI 31.06 kg/m2 33.23 kg/m2 30.85 kg/m2   ? ? ?Physical Exam ?Vitals reviewed.  ?Constitutional:   ?   Appearance: Normal appearance.  ?Neck:  ?   Vascular: No carotid bruit.  ?Cardiovascular:  ?   Rate and Rhythm: Normal rate and regular rhythm.  ?   Hear

## 2021-11-02 ENCOUNTER — Encounter: Payer: Self-pay | Admitting: Family Medicine

## 2021-11-02 DIAGNOSIS — L89613 Pressure ulcer of right heel, stage 3: Secondary | ICD-10-CM | POA: Diagnosis not present

## 2021-11-02 LAB — CBC WITH DIFFERENTIAL/PLATELET
Basophils Absolute: 0.1 10*3/uL (ref 0.0–0.2)
Basos: 2 %
EOS (ABSOLUTE): 0.1 10*3/uL (ref 0.0–0.4)
Eos: 2 %
Hematocrit: 52.2 % — ABNORMAL HIGH (ref 37.5–51.0)
Hemoglobin: 17 g/dL (ref 13.0–17.7)
Immature Grans (Abs): 0 10*3/uL (ref 0.0–0.1)
Immature Granulocytes: 0 %
Lymphocytes Absolute: 0.8 10*3/uL (ref 0.7–3.1)
Lymphs: 18 %
MCH: 32.2 pg (ref 26.6–33.0)
MCHC: 32.6 g/dL (ref 31.5–35.7)
MCV: 99 fL — ABNORMAL HIGH (ref 79–97)
Monocytes Absolute: 0.6 10*3/uL (ref 0.1–0.9)
Monocytes: 12 %
Neutrophils Absolute: 2.9 10*3/uL (ref 1.4–7.0)
Neutrophils: 66 %
Platelets: 156 10*3/uL (ref 150–450)
RBC: 5.28 x10E6/uL (ref 4.14–5.80)
RDW: 14.6 % (ref 11.6–15.4)
WBC: 4.5 10*3/uL (ref 3.4–10.8)

## 2021-11-02 LAB — LIPID PANEL
Chol/HDL Ratio: 2.7 ratio (ref 0.0–5.0)
Cholesterol, Total: 118 mg/dL (ref 100–199)
HDL: 44 mg/dL (ref >39)
LDL Chol Calc (NIH): 59 mg/dL (ref 0–99)
Triglycerides: 75 mg/dL (ref 0–149)
VLDL Cholesterol Cal: 15 mg/dL (ref 5–40)

## 2021-11-02 LAB — COMPREHENSIVE METABOLIC PANEL
ALT: 22 IU/L (ref 0–44)
AST: 20 IU/L (ref 0–40)
Albumin/Globulin Ratio: 1.6 (ref 1.2–2.2)
Albumin: 3.9 g/dL (ref 3.6–4.6)
Alkaline Phosphatase: 117 IU/L (ref 44–121)
BUN/Creatinine Ratio: 15 (ref 10–24)
BUN: 25 mg/dL (ref 8–27)
Bilirubin Total: 1.2 mg/dL (ref 0.0–1.2)
CO2: 23 mmol/L (ref 20–29)
Calcium: 8.7 mg/dL (ref 8.6–10.2)
Chloride: 103 mmol/L (ref 96–106)
Creatinine, Ser: 1.72 mg/dL — ABNORMAL HIGH (ref 0.76–1.27)
Globulin, Total: 2.4 g/dL (ref 1.5–4.5)
Glucose: 131 mg/dL — ABNORMAL HIGH (ref 70–99)
Potassium: 4.7 mmol/L (ref 3.5–5.2)
Sodium: 141 mmol/L (ref 134–144)
Total Protein: 6.3 g/dL (ref 6.0–8.5)
eGFR: 39 mL/min/{1.73_m2} — ABNORMAL LOW (ref >59)

## 2021-11-02 LAB — HEMOGLOBIN A1C
Est. average glucose Bld gHb Est-mCnc: 183 mg/dL
Hgb A1c MFr Bld: 8 % — ABNORMAL HIGH (ref 4.8–5.6)

## 2021-11-02 LAB — MICROALBUMIN / CREATININE URINE RATIO
Creatinine, Urine: 92.5 mg/dL
Microalb/Creat Ratio: 146 mg/g creat — ABNORMAL HIGH (ref 0–29)
Microalbumin, Urine: 134.6 ug/mL

## 2021-11-02 LAB — CARDIOVASCULAR RISK ASSESSMENT

## 2021-11-02 LAB — TSH: TSH: 2.17 u[IU]/mL (ref 0.450–4.500)

## 2021-11-02 NOTE — Assessment & Plan Note (Signed)
On xarelto. Needed for atrial fibrillation.  

## 2021-11-02 NOTE — Assessment & Plan Note (Signed)
Requires oxygen 2 L at night.  ?

## 2021-11-02 NOTE — Assessment & Plan Note (Signed)
The current medical regimen is effective;  continue present plan and medications.  ?Continue Carvedilol 12.5 mg twice daily,Taking Xarelto 20 mg daily, and amiodarone 200 mg daily.  ?

## 2021-11-02 NOTE — Assessment & Plan Note (Signed)
Uses cpap with oxygen at 2 L.  ?

## 2021-11-02 NOTE — Assessment & Plan Note (Signed)
Well controlled.  ?No changes to medicines. Continue Atorvastatin 10 mg daily. ?Continue to work on eating a healthy diet and exercise.  ?Labs drawn today.  ? ?

## 2021-11-02 NOTE — Assessment & Plan Note (Signed)
Continue testosterone 200 mg IM every 14 days.  ?

## 2021-11-02 NOTE — Assessment & Plan Note (Signed)
The current medical regimen is effective;  continue present plan and medications. Continue pepcid  

## 2021-11-02 NOTE — Assessment & Plan Note (Signed)
Well controlled.  ?No changes to medicines. Continue  Carvedilol 12.5 mg twice daily ?Continue to work on eating a healthy diet and exercise.  ?Labs drawn today.  ? ?

## 2021-11-02 NOTE — Assessment & Plan Note (Addendum)
Control: not at goal.  ?Recommend check sugars fasting daily. ?Recommend check feet daily. ?Recommend annual eye exams. ?Medicines:  continue farxiga 10 mg daily, glipizide 10 mg twice daily. Recommend start toujeo 10 U daily ? ?Continue to work on eating a healthy diet and exercise.  ?Labs drawn today.   ? ?

## 2021-11-02 NOTE — Assessment & Plan Note (Addendum)
I would recommend kerendia 10 mg daily.  ?Follow up with nephrology.  ?

## 2021-11-02 NOTE — Assessment & Plan Note (Signed)
Continue synthroid 150 mcg once daily in am. ?

## 2021-11-02 NOTE — Assessment & Plan Note (Signed)
Control: not at goal ?Recommend check sugars fasting daily. ?Recommend check feet daily. ?Recommend annual eye exams. ?Medicines: continue farxiga 10 mg daily, glipizide 10 mg twice daily. Recommend start toujeo 10 U daily ?Continue to work on eating a healthy diet and exercise.  ?Labs drawn today.   ? ?

## 2021-11-02 NOTE — Progress Notes (Signed)
Blood count normal.  ?Liver function normal.  ?Thyroid function normal.  ?Cholesterol: great. ?HBA1C: 8. Recommend add toujeo 20 U daily ?Kidney function abnormal. Spilling protein in urine. I had patient on [place]. I am not sure if it was not approved or if it was too expensive.  ?

## 2021-11-03 ENCOUNTER — Other Ambulatory Visit: Payer: Self-pay

## 2021-11-03 DIAGNOSIS — E1159 Type 2 diabetes mellitus with other circulatory complications: Secondary | ICD-10-CM

## 2021-11-03 MED ORDER — TOUJEO SOLOSTAR 300 UNIT/ML ~~LOC~~ SOPN: 20.0000 [IU] | PEN_INJECTOR | Freq: Every day | SUBCUTANEOUS | 1 refills | Status: DC

## 2021-11-03 NOTE — Telephone Encounter (Signed)
Refill sent to pharmacy.   

## 2021-11-09 DIAGNOSIS — L89613 Pressure ulcer of right heel, stage 3: Secondary | ICD-10-CM | POA: Diagnosis not present

## 2021-11-23 DIAGNOSIS — L89613 Pressure ulcer of right heel, stage 3: Secondary | ICD-10-CM | POA: Diagnosis not present

## 2021-11-24 ENCOUNTER — Ambulatory Visit (INDEPENDENT_AMBULATORY_CARE_PROVIDER_SITE_OTHER): Payer: Medicare Other

## 2021-11-24 VITALS — BP 132/72 | HR 68 | Resp 16 | Ht 72.0 in | Wt 235.0 lb

## 2021-11-24 DIAGNOSIS — Z0000 Encounter for general adult medical examination without abnormal findings: Secondary | ICD-10-CM | POA: Diagnosis not present

## 2021-11-24 DIAGNOSIS — Z6831 Body mass index (BMI) 31.0-31.9, adult: Secondary | ICD-10-CM | POA: Diagnosis not present

## 2021-11-24 NOTE — Progress Notes (Signed)
Subjective:   Paul Kent is a 81 y.o. male who presents for Medicare Annual/Subsequent preventive examination.  This wellness visit is conducted by a nurse.  The patient's medications were reviewed and reconciled since the patient's last visit.  History details were provided by the patient.  The history appears to be reliable.    Medical History: Patient history and Family history was reviewed  Medications, Allergies, and preventative health maintenance was reviewed and updated.  Cardiac Risk Factors include: advanced age (>45mn, >>70women);diabetes mellitus;dyslipidemia;male gender;obesity (BMI >30kg/m2)     Objective:    Today's Vitals   11/24/21 0855  BP: 132/72  Pulse: 68  Resp: 16  Weight: 235 lb (106.6 kg)  Height: 6' (1.829 m)  PainSc: 0-No pain   Body mass index is 31.87 kg/m.     07/08/2020    8:09 AM 06/29/2020    2:22 PM 05/21/2020    5:56 AM 03/22/2018    9:33 AM  Advanced Directives  Does Patient Have a Medical Advance Directive? Yes Yes Yes Yes  Type of Advance Directive Living will [place] will  Copy of [place] Chart?    No - copy requested    Current Medications (verified) Outpatient Encounter Medications as of 11/24/2021  Medication Sig   amiodarone (PACERONE) 200 MG tablet TAKE ONE TABLET BY MOUTH EVERY DAY   atorvastatin (LIPITOR) 10 MG tablet Take 1 tablet (10 mg total) by mouth every evening.   B-D UF III MINI PEN NEEDLES 31G X 5 MM MISC SMARTSIG:injection Daily   carvedilol (COREG) 12.5 MG tablet Take 1 tablet (12.5 mg total) by mouth 2 (two) times daily.   famotidine (PEPCID) 40 MG tablet Take 1 tablet (40 mg total) by mouth daily.   famotidine (PEPCID) 40 MG tablet Take 1 tablet by mouth daily.   FARXIGA 10 MG TABS tablet TAKE ONE TABLET BY MOUTH EVERY DAY (Patient taking differently: Take 10 mg by mouth daily.)    FINERENONE PO Take by mouth.   gabapentin (NEURONTIN) 300 MG capsule TAKE ONE CAPSULE BY MOUTH 3 TIMES DAILY (Patient taking differently: Take 300 mg by mouth 3 (three) times daily.)   glipiZIDE (GLUCOTROL XL) 10 MG 24 hr tablet TAKE ONE TABLET BY MOUTH TWICE DAILY   insulin glargine, 1 Unit Dial, (TOUJEO SOLOSTAR) 300 UNIT/ML Solostar Pen Inject 20 Units into the skin daily at 6 (six) AM.   levothyroxine (SYNTHROID) 150 MCG tablet Take 1 tablet (150 mcg total) by mouth daily.   neomycin-polymyxin b-dexamethasone (MAXITROL) 3.5-10000-0.1 SUSP Place into the right eye.   testosterone cypionate (DEPOTESTOSTERONE CYPIONATE) 200 MG/ML injection INJECT 1 MILLILITER IN THE MUSCLE EVERY 2 WEEKS (Patient taking differently: Inject 100 mg into the muscle every 14 (fourteen) days. INJECT 1 MILLILITER IN THE MUSCLE EVERY 2 WEEKS)   XARELTO 20 MG TABS tablet TAKE ONE TABLET BY MOUTH EVERY EVENING (Patient taking differently: Take 20 mg by mouth daily with supper.)   [DISCONTINUED] zolpidem (AMBIEN) 10 MG tablet Take 1 tablet by mouth at bedtime as needed. (Patient not taking: Reported on 11/24/2021)   No facility-administered encounter medications on file as of 11/24/2021.    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   Cellulitis of right lower limb    CHF (congestive heart failure) ([place]) 02/2018   Chronic atrial fibrillation (H[facility])    Corns and callosities    Deviated septum 07/01/2018   Deviated septum 07/01/2018   Diabetes mellitus due to underlying  condition with unspecified complications ([place]) 16/03/9603   Disturbances of salivary secretion    Epistaxis    Essential hypertension    Essential tremor    H pylori ulcer    H pylori ulcer    Herpes zoster with nervous system complication 54/02/8118   Hesitancy of micturition    Hyperlipidemia    Hypothyroidism    Impacted cerumen, bilateral    Jaw pain    Lesion of femoral nerve 04/30/2013   Localized edema    Male erectile disorder    Malignant neoplasm of overlapping sites of bladder ([place])    Nasal congestion 07/01/2018   Nasal turbinate hypertrophy 07/01/2018   Obstructive sleep apnea 01/17/2017   Other constipation    Other fatigue    Overweight    Pacemaker reprogramming/check 02/12/2017   Pain in right finger(s)    Pain in right lower leg    Paroxysmal atrial fibrillation (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)   Shingles 06/2012   Shortness of breath    Sick sinus syndrome ([place])    Sinus bradycardia 03/07/2017   Sleep apnea    dx 08/2015, CPAP   Spontaneous ecchymoses    Status post ablation of atrial fibrillation 09/16/2020   Testicular hypofunction    Thoracic or lumbosacral neuritis or radiculitis 06/03/2013   Trigger middle finger of right hand 11/28/2019   Trigger middle finger of right hand 11/28/2019   Type 2 diabetes mellitus with circulatory disorder, 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[place]) 01/17/2017   Past Surgical History:  Procedure Laterality Date   ABLATION  06/2017   ATRIAL FIBRILLATION ABLATION N/A 05/21/2020   Procedure: ATRIAL FIBRILLATION ABLATION;  Surgeon: Constance Haw, MD;  Location: [facility];  Service: Cardiovascular;  Laterality: N/A;   CARDIOVERSION N/A  03/22/2018   Procedure: CARDIOVERSION;  Surgeon: Buford Dresser, MD;  Location[facility] ENDOSCOPY;  Service: Cardiovascular;  Laterality: N/A;   CARDIOVERSION N/A 07/08/2020   Procedure: CARDIOVERSION;  Surgeon: Jerline Pain, MD;  Location: [facility] ENDOSCOPY;  Service: Cardiovascular;  Laterality: N/A;   HEMIARTHROPLASTY HIP  10/31/2013   IT HIP BIPOLAR   LUMBAR SPINE SURGERY     PACEMAKER PLACEMENT  01/29/2017   SHOULDER SURGERY Right    Family History  Problem Relation Age of Onset   Heart attack Mother    Transient ischemic attack Mother    Heart Problems Father    Tuberculosis Brother    Diabetes type II Other    Hyperlipidemia Other    Congestive Heart Failure Other    Arthritis Other    Social History   Socioeconomic History   Marital status: Married    Spouse name: [name]   Number of children: 3  Years of education: Not on file   Highest education level: Not on file  Occupational History   Occupation: RETIRED    Comment: school principal  Tobacco Use   Smoking status: Never   Smokeless tobacco: Never  Vaping Use   Vaping Use: Never used  Substance and Sexual Activity   Alcohol use: Not Currently   Drug use: Never   Sexual activity: Not on file  Other Topics Concern   Not on file  Social History Narrative   Lives with wife   Social Determinants of Health   Financial Resource Strain: Low Risk  (11/24/2021)   Overall Financial Resource Strain (CARDIA)    Difficulty of Paying Living Expenses: Not hard at all  Food Insecurity: No Food Insecurity (11/24/2021)   Hunger Vital Sign    Worried About Running Out of Food in the Last Year: Never true    [place] in the Last Year: Never true  Transportation Needs: No Transportation Needs (11/24/2021)   PRAPARE - Hydrologist (Medical): No    Lack of Transportation (Non-Medical): No  Physical Activity: Sufficiently Active (11/24/2021)   Exercise Vital Sign    Days of Exercise per Week: 4  days    Minutes of Exercise per Session: 60 min  Stress: No Stress Concern Present (11/24/2021)   [place] of Stress : Not at all  Social Connections: [place] (11/24/2021)   Social Connection and Isolation Panel [NHANES]    Frequency of Communication with Friends and Family: More than three times a week    Frequency of Social Gatherings with Friends and Family: More than three times a week    Attends Religious Services: More than 4 times per year    Active Member of [employer] or Organizations: Yes    Attends Music therapist: More than 4 times per year    Marital Status: Married    Tobacco Counseling Counseling given: Patient does not use tobacco products   Clinical Intake:  Pre-visit preparation completed: Yes Pain : No/denies pain Pain Score: 0-No pain   BMI - recorded: 31.87 Nutritional Status: BMI > 30  Obese Nutritional Risks: None Diabetes: Yes (A1C 8.0) CBG done?: No Did pt. bring in CBG monitor from home?: No How often do you need to have someone help you when you read instructions, pamphlets, or other written materials from your doctor or pharmacy?: 2 - Rarely Interpreter Needed?: No Information entered by :: Kim  Activities of Daily Living    11/24/2021    9:09 AM  In your present state of health, do you have any difficulty performing the following activities:  Hearing? 0  Vision? 0  Difficulty concentrating or making decisions? 0  Walking or climbing stairs? 1  Dressing or bathing? 0  Doing errands, shopping? 0  Preparing Food and eating ? N  Using the Toilet? N  In the past six months, have you accidently leaked urine? Y  Do you have problems with loss of bowel control? N  Managing your Medications? N  Managing your Finances? N  Housekeeping or managing your Housekeeping? N    Patient Care Team: [name], MD as PCP - General (Internal  Medicine) Constance Haw, MD as PCP - Electrophysiology (Cardiology) Joie Bimler, MD as Consulting Physician (Urology) Leonard Downing, MD (Orthopedic Surgery) Yvone Neu, MD as Referring Physician (Family Medicine) Alen Blew, [place] (Optometry) Merrilyn Puma,  Arlester Marker, [facility] as Pharmacist (Pharmacist)     Assessment:   This is a routine wellness examination for Shar[place]ff.  Dietary issues and exercise activities discussed: Current Exercise Habits: Home exercise routine, Type of exercise: Other - see comments;strength training/weights;stretching (bicycle), Time (Minutes): 40, Frequency (Times/Week): 4, Weekly Exercise (Minutes/Week): 160, Intensity: Moderate  Depression Screen    11/24/2021    9:00 AM 04/06/2021   10:56 AM 01/20/2021    8:51 AM 11/16/2020    9:21 AM 06/29/2020    2:22 PM  PHQ 2/9 Scores  PHQ - 2 Score 0 0 0 0 0    Fall Risk    11/24/2021    9:06 AM 07/29/2021    8:27 AM 05/30/2021   11:16 AM 04/06/2021   10:56 AM 01/20/2021    8:50 AM  Fall Risk   Falls in the past year? 0 0 0 0 1  Number falls in past yr: 0 0 0 0 1  Injury with Fall? 0 0 0 0 1  Risk for fall due to : Impaired balance/gait  Impaired balance/gait Impaired balance/gait   Follow up Falls evaluation completed;Education provided Falls evaluation completed Falls evaluation completed Falls evaluation completed Falls prevention discussed    FALL RISK PREVENTION PERTAINING TO THE HOME:  Any stairs in or around the home? No  If so, are there any without handrails? No  Home free of loose throw rugs in walkways, pet beds, electrical cords, etc? Yes  Adequate lighting in your home to reduce risk of falls? Yes   ASSISTIVE DEVICES UTILIZED TO PREVENT FALLS:  Use of a cane, walker or w/c? Yes  Grab bars in the bathroom? Yes  Shower chair or bench in shower? Yes  Elevated toilet seat or a handicapped toilet? Yes   Gait slow and steady with assistive device  Cognitive Function:        11/24/2021     9:10 AM  6CIT Screen  What Year? 0 points  What month? 0 points  What time? 0 points  Count back from 20 0 points  Months in reverse 0 points  Repeat phrase 0 points  Total Score 0 points    Immunizations Immunization History  Administered Date(s) Administered   Fluad Quad(high Dose 65+) 03/22/2020   Influenza-Unspecified 02/26/2021   Moderna SARS-COV2 Booster Vaccination 10/12/2020   Moderna Sars-Covid-2 Vaccination 07/11/2019, 08/04/2019, 04/13/2020   Pneumococcal Conjugate-13 03/23/2015   Pneumococcal Polysaccharide-23 04/25/2017   Pneumococcal-Unspecified 06/19/2014, 06/19/2014   Zoster Recombinat (Shingrix) 06/19/2013, 08/22/2017   Zoster, Live 06/19/2013    TDAP status: Due, Education has been provided regarding the importance of this vaccine. Advised may receive this vaccine at local pharmacy or Health Dept. Aware to provide a copy of the vaccination record if obtained from local pharmacy or Health Dept. Verbalized acceptance and understanding.  Flu Vaccine status: Up to date  Pneumococcal vaccine status: Up to date  Covid-19 vaccine status: Completed vaccines  Qualifies for Shingles Vaccine? Yes   Zostavax completed Yes   Shingrix Completed?: Yes  Screening Tests Health Maintenance  Topic Date Due   OPHTHALMOLOGY EXAM  Never done   TETANUS/TDAP  Never done   COVID-19 Vaccine (4 - Booster for Moderna series) 12/07/2020   INFLUENZA VACCINE  01/17/2022   HEMOGLOBIN A1C  05/04/2022   FOOT EXAM  07/29/2022   URINE MICROALBUMIN  11/02/2022   Pneumonia Vaccine 82+ Years old  Completed   Zoster Vaccines- Shingrix  Completed   HPV VACCINES  Aged Out  Health Maintenance  Health Maintenance Due  Topic Date Due   OPHTHALMOLOGY EXAM  Never done   TETANUS/TDAP  Never done   COVID-19 Vaccine (4 - Booster for Moderna series) 12/07/2020    Colorectal cancer screening: No longer required.   Lung Cancer Screening: (Low Dose CT Chest recommended if Age 71-80  years, 30 pack-year currently smoking OR have quit w/in 15years.) does not qualify.   Additional Screening:  Vision Screening: Recommended annual ophthalmology exams for early detection of glaucoma and other disorders of the eye. Is the patient up to date with their annual eye exam?  Yes  Who is the provider or what is the name of the office in which the patient attends annual eye exams? [place] Screening: Recommended annual dental exams for proper oral hygiene     Plan:    1- Continue healthy diet and exercise 2- Flu Vaccine due in the Fall 3- Bring a copy of your healthcare power of attorney for your medical record   I have personally reviewed and noted the following in the patient's chart:   Medical and social history Use of alcohol, tobacco or illicit drugs  Current medications and supplements including opioid prescriptions.  Functional ability and status Nutritional status Physical activity Advanced directives List of other physicians Hospitalizations, surgeries, and ER visits in previous 12 months Vitals Screenings to include cognitive, depression, and falls Referrals and appointments  In addition, I have reviewed and discussed with patient certain preventive protocols, quality metrics, and best practice recommendations. A written personalized care plan for preventive services as well as general preventive health recommendations were provided to patient.     Erie Noe, LPN   01/18/9561

## 2021-11-24 NOTE — Patient Instructions (Signed)
Paul Kent , Thank you for taking time to come for your Medicare Wellness Visit. I appreciate your ongoing commitment to your health goals. Please review the following plan we discussed and let me know if I can assist you in the future.   Screening recommendations/referrals: Health Maintenance  Topic Date Due   TETANUS/TDAP  Never done   INFLUENZA VACCINE  01/17/2022   HEMOGLOBIN A1C  05/04/2022   FOOT EXAM  07/29/2022   URINE MICROALBUMIN  11/02/2022   Pneumonia Vaccine 41+ Years old  Completed   Zoster Vaccines- Shingrix  Completed    -Recommended yearly ophthalmology/optometry visit for glaucoma screening and checkup -Recommended yearly dental visit for hygiene and checkup    Preventive Care 44 Years and Older, Male Preventive care refers to lifestyle choices and visits with your health care provider that can promote health and wellness. What does preventive care include? A yearly physical exam. This is also called an annual well check. Dental exams once or twice a year. Routine eye exams. Ask your health care provider how often you should have your eyes checked. Personal lifestyle choices, including: Daily care of your teeth and gums. Regular physical activity. Eating a healthy diet. Avoiding tobacco and drug use. Limiting alcohol use. Practicing safe sex. Taking low doses of aspirin every day. Taking vitamin and mineral supplements as recommended by your health care provider. What happens during an annual well check? The services and screenings done by your health care provider during your annual well check will depend on your age, overall health, lifestyle risk factors, and family history of disease. Counseling  Your health care provider may ask you questions about your: Alcohol use. Tobacco use. Drug use. Emotional well-being. Home and relationship well-being. Sexual activity. Eating habits. History of falls. Memory and ability to understand (cognition). Work  and work Statistician. Screening  You may have the following tests or measurements: Height, weight, and BMI. Blood pressure. Lipid and cholesterol levels. These may be checked every 5 years, or more frequently if you are over 60 years old. Skin check. Lung cancer screening. You may have this screening every year starting at age 32 if you have a 30-pack-year history of smoking and currently smoke or have quit within the past 15 years. Fecal occult blood test (FOBT) of the stool. You may have this test every year starting at age 20. Flexible sigmoidoscopy or colonoscopy. You may have a sigmoidoscopy every 5 years or a colonoscopy every 10 years starting at age 7. Prostate cancer screening. Recommendations will vary depending on your family history and other risks. Hepatitis C blood test. Hepatitis B blood test. Sexually transmitted disease (STD) testing. Diabetes screening. This is done by checking your blood sugar (glucose) after you have not eaten for a while (fasting). You may have this done every 1-3 years. Abdominal aortic aneurysm (AAA) screening. You may need this if you are a current or former smoker. Osteoporosis. You may be screened starting at age 44 if you are at high risk. Talk with your health care provider about your test results, treatment options, and if necessary, the need for more tests. Vaccines  Your health care provider may recommend certain vaccines, such as: Influenza vaccine. This is recommended every year. Tetanus, diphtheria, and acellular pertussis (Tdap, Td) vaccine. You may need a Td booster every 10 years. Zoster vaccine. You may need this after age 40. Pneumococcal 13-valent conjugate (PCV13) vaccine. One dose is recommended after age 10. Pneumococcal polysaccharide (PPSV23) vaccine. One dose is recommended  after age 73. Talk to your health care provider about which screenings and vaccines you need and how often you need them. This information is not intended  to replace advice given to you by your health care provider. Make sure you discuss any questions you have with your health care provider. Document Released: 07/02/2015 Document Revised: 02/23/2016 Document Reviewed: 04/06/2015 Elsevier Interactive Patient Education  2017 [place] Prevention in the Home Falls can cause injuries. They can happen to people of all ages. There are many things you can do to make your home safe and to help prevent falls. What can I do on the outside of my home? Regularly fix the edges of walkways and driveways and fix any cracks. Remove anything that might make you trip as you walk through a door, such as a raised step or threshold. Trim any bushes or trees on the path to your home. Use bright outdoor lighting. Clear any walking paths of anything that might make someone trip, such as rocks or tools. Regularly check to see if handrails are loose or broken. Make sure that both sides of any steps have handrails. Any raised decks and porches should have guardrails on the edges. Have any leaves, snow, or ice cleared regularly. Use sand or salt on walking paths during winter. Clean up any spills in your garage right away. This includes oil or grease spills. What can I do in the bathroom? Use night lights. Install grab bars by the toilet and in the tub and shower. Do not use towel bars as grab bars. Use non-skid mats or decals in the tub or shower. If you need to sit down in the shower, use a plastic, non-slip stool. Keep the floor dry. Clean up any water that spills on the floor as soon as it happens. Remove soap buildup in the tub or shower regularly. Attach bath mats securely with double-sided non-slip rug tape. Do not have throw rugs and other things on the floor that can make you trip. What can I do in the bedroom? Use night lights. Make sure that you have a light by your bed that is easy to reach. Do not use any sheets or blankets that are too big  for your bed. They should not hang down onto the floor. Have a firm chair that has side arms. You can use this for support while you get dressed. Do not have throw rugs and other things on the floor that can make you trip. What can I do in the kitchen? Clean up any spills right away. Avoid walking on wet floors. Keep items that you use a lot in easy-to-reach places. If you need to reach something above you, use a strong step stool that has a grab bar. Keep electrical cords out of the way. Do not use floor polish or wax that makes floors slippery. If you must use wax, use non-skid floor wax. Do not have throw rugs and other things on the floor that can make you trip. What can I do with my stairs? Do not leave any items on the stairs. Make sure that there are handrails on both sides of the stairs and use them. Fix handrails that are broken or loose. Make sure that handrails are as long as the stairways. Check any carpeting to make sure that it is firmly attached to the stairs. Fix any carpet that is loose or worn. Avoid having throw rugs at the top or bottom of the stairs. If you  do have throw rugs, attach them to the floor with carpet tape. Make sure that you have a light switch at the top of the stairs and the bottom of the stairs. If you do not have them, ask someone to add them for you. What else can I do to help prevent falls? Wear shoes that: Do not have high heels. Have rubber bottoms. Are comfortable and fit you well. Are closed at the toe. Do not wear sandals. If you use a stepladder: Make sure that it is fully opened. Do not climb a closed stepladder. Make sure that both sides of the stepladder are locked into place. Ask someone to hold it for you, if possible. Clearly mark and make sure that you can see: Any grab bars or handrails. First and last steps. Where the edge of each step is. Use tools that help you move around (mobility aids) if they are needed. These  include: Canes. Walkers. Scooters. Crutches. Turn on the lights when you go into a dark area. Replace any light bulbs as soon as they burn out. Set up your furniture so you have a clear path. Avoid moving your furniture around. If any of your floors are uneven, fix them. If there are any pets around you, be aware of where they are. Review your medicines with your doctor. Some medicines can make you feel dizzy. This can increase your chance of falling. Ask your doctor what other things that you can do to help prevent falls. This information is not intended to replace advice given to you by your health care provider. Make sure you discuss any questions you have with your health care provider. Document Released: 04/01/2009 Document Revised: 11/11/2015 Document Reviewed: 07/10/2014 Elsevier Interactive Patient Education  2017 [employer].

## 2021-11-26 NOTE — Addendum Note (Signed)
Addended byRochel Brome on: 11/26/2021 01:19 PM   Modules accepted: Level of Service

## 2021-11-30 DIAGNOSIS — L89613 Pressure ulcer of right heel, stage 3: Secondary | ICD-10-CM | POA: Diagnosis not present

## 2021-12-07 DIAGNOSIS — L89613 Pressure ulcer of right heel, stage 3: Secondary | ICD-10-CM | POA: Diagnosis not present

## 2021-12-07 LAB — CUP PACEART REMOTE DEVICE CHECK
Battery Remaining Percentage: 50 %
Brady Statistic RA Percent Paced: 14 %
Brady Statistic RV Percent Paced: 98 %
Date Time Interrogation Session: 20230621143431
Implantable Lead Implant Date: 20180813
Implantable Lead Implant Date: 20180813
Implantable Lead Location: 753859
Implantable Lead Location: 753860
Implantable Lead Model: 377
Implantable Lead Model: 377
Implantable Lead Serial Number: 49893169
Implantable Lead Serial Number: 50011411
Implantable Pulse Generator Implant Date: 20180813
Lead Channel Impedance Value: 488 Ohm
Lead Channel Impedance Value: 527 Ohm
Lead Channel Sensing Intrinsic Amplitude: 0.3 mV
Lead Channel Sensing Intrinsic Amplitude: 8.9 mV
Lead Channel Setting Pacing Amplitude: 2.4 V
Lead Channel Setting Pacing Amplitude: 2.4 V
Lead Channel Setting Pacing Pulse Width: 0.4 ms
Pulse Gen Model: 407145
Pulse Gen Serial Number: 69158272

## 2021-12-08 ENCOUNTER — Ambulatory Visit (INDEPENDENT_AMBULATORY_CARE_PROVIDER_SITE_OTHER): Payer: Medicare Other

## 2021-12-08 DIAGNOSIS — I495 Sick sinus syndrome: Secondary | ICD-10-CM | POA: Diagnosis not present

## 2021-12-12 ENCOUNTER — Other Ambulatory Visit: Payer: Self-pay | Admitting: Family Medicine

## 2021-12-12 ENCOUNTER — Encounter: Payer: Medicare Other | Admitting: Cardiology

## 2021-12-12 DIAGNOSIS — K219 Gastro-esophageal reflux disease without esophagitis: Secondary | ICD-10-CM

## 2021-12-14 ENCOUNTER — Other Ambulatory Visit: Payer: Self-pay

## 2021-12-14 DIAGNOSIS — L89613 Pressure ulcer of right heel, stage 3: Secondary | ICD-10-CM | POA: Diagnosis not present

## 2021-12-14 MED ORDER — TESTOSTERONE CYPIONATE 200 MG/ML IM SOLN: 200.0000 mg | INTRAMUSCULAR | 0 refills | Status: DC

## 2021-12-15 NOTE — Progress Notes (Signed)
Remote pacemaker transmission.   

## 2021-12-27 ENCOUNTER — Other Ambulatory Visit: Payer: Self-pay | Admitting: Family Medicine

## 2021-12-27 DIAGNOSIS — E08621 Diabetes mellitus due to underlying condition with foot ulcer: Secondary | ICD-10-CM

## 2022-01-06 DIAGNOSIS — L89613 Pressure ulcer of right heel, stage 3: Secondary | ICD-10-CM | POA: Diagnosis not present

## 2022-01-13 DIAGNOSIS — L89613 Pressure ulcer of right heel, stage 3: Secondary | ICD-10-CM | POA: Diagnosis not present

## 2022-01-16 DIAGNOSIS — E113293 Type 2 diabetes mellitus with mild nonproliferative diabetic retinopathy without macular edema, bilateral: Secondary | ICD-10-CM | POA: Diagnosis not present

## 2022-01-16 DIAGNOSIS — H25812 Combined forms of age-related cataract, left eye: Secondary | ICD-10-CM | POA: Diagnosis not present

## 2022-01-16 LAB — HM DIABETES EYE EXAM

## 2022-01-17 ENCOUNTER — Encounter: Payer: Self-pay | Admitting: Family Medicine

## 2022-01-19 DIAGNOSIS — E11621 Type 2 diabetes mellitus with foot ulcer: Secondary | ICD-10-CM | POA: Diagnosis not present

## 2022-01-19 DIAGNOSIS — L89613 Pressure ulcer of right heel, stage 3: Secondary | ICD-10-CM | POA: Diagnosis not present

## 2022-01-19 DIAGNOSIS — L97509 Non-pressure chronic ulcer of other part of unspecified foot with unspecified severity: Secondary | ICD-10-CM | POA: Diagnosis not present

## 2022-01-20 ENCOUNTER — Other Ambulatory Visit: Payer: Self-pay | Admitting: Family Medicine

## 2022-01-20 DIAGNOSIS — L89613 Pressure ulcer of right heel, stage 3: Secondary | ICD-10-CM | POA: Diagnosis not present

## 2022-01-20 DIAGNOSIS — E11621 Type 2 diabetes mellitus with foot ulcer: Secondary | ICD-10-CM | POA: Diagnosis not present

## 2022-01-20 DIAGNOSIS — L97509 Non-pressure chronic ulcer of other part of unspecified foot with unspecified severity: Secondary | ICD-10-CM | POA: Diagnosis not present

## 2022-01-23 ENCOUNTER — Encounter: Payer: Self-pay | Admitting: Cardiology

## 2022-01-23 ENCOUNTER — Ambulatory Visit: Payer: Medicare Other | Admitting: Cardiology

## 2022-01-23 ENCOUNTER — Ambulatory Visit (INDEPENDENT_AMBULATORY_CARE_PROVIDER_SITE_OTHER): Payer: Medicare Other | Admitting: Cardiology

## 2022-01-23 VITALS — BP 124/68 | HR 65 | Ht 72.0 in | Wt 246.2 lb

## 2022-01-23 DIAGNOSIS — D6869 Other thrombophilia: Secondary | ICD-10-CM | POA: Diagnosis not present

## 2022-01-23 DIAGNOSIS — I482 Chronic atrial fibrillation, unspecified: Secondary | ICD-10-CM

## 2022-01-23 DIAGNOSIS — Z950 Presence of cardiac pacemaker: Secondary | ICD-10-CM | POA: Diagnosis not present

## 2022-01-23 DIAGNOSIS — I495 Sick sinus syndrome: Secondary | ICD-10-CM | POA: Diagnosis not present

## 2022-01-23 NOTE — Patient Instructions (Signed)
Medication Instructions:  Your physician recommends that you continue on your current medications as directed. Please refer to the Current Medication list given to you today.  *If you need a refill on your cardiac medications before your next appointment, please call your pharmacy*   Lab Work: None ordered.  If you have labs (blood work) drawn today and your tests are completely normal, you will receive your results only by: [place] (if you have MyChart) OR A paper copy in the mail If you have any lab test that is abnormal or we need to change your treatment, we will call you to review the results.   Testing/Procedures: None ordered.    Follow-Up: At [facility], you and your health needs are our priority.  As part of our continuing mission to provide you with exceptional heart care, we have created designated Provider Care Teams.  These Care Teams include your primary Cardiologist (physician) and Advanced Practice Providers (APPs -  Physician Assistants and Nurse Practitioners) who all work together to provide you with the care you need, when you need it.  We recommend signing up for the patient portal called "MyChart".  Sign up information is provided on this After Visit Summary.  MyChart is used to connect with patients for Virtual Visits (Telemedicine).  Patients are able to view lab/test results, encounter notes, upcoming appointments, etc.  Non-urgent messages can be sent to your provider as well.   To learn more about what you can do with MyChart, go to [email or web address].    Your next appointment:   6 months with Dr Curt Bears  Important Information About Sugar

## 2022-01-23 NOTE — Progress Notes (Signed)
Electrophysiology Office Note   Date:  01/23/2022   ID:  Paul, Kent 1941/05/15, MRN [id number]  PCP:  [name], MD  Cardiologist:  Primary Electrophysiologist:  Janson Lamar [name], MD    No chief complaint on file.     History of Present Illness: Paul Kent is a 81 y.o. male who is being seen today for the evaluation of atrial fibrillation at the request of Cox, Kirsten, MD. Presenting today for electrophysiology evaluation.    He has a history significant for persistent atrial fibrillation, hypertension, hyperlipidemia, obstructive sleep apnea, type 2 diabetes, sick sinus syndrome status post Biotronik dual-chamber pacemaker.  He is post cryoablation in 2019 with atrial fibrillation ablation 05/21/2020.  He continued to have episodes of atrial fibrillation and atrial flutter.  He has had multiple cardioversions since that time.  Today, denies symptoms of palpitations, chest pain, shortness of breath, orthopnea, PND, lower extremity edema, claudication, dizziness, presyncope, syncope, bleeding, or neurologic sequela. The patient is tolerating medications without difficulties.  He feels well today.  Over the last few months, he has been more short of breath.  This is mainly with exertion.  3 days ago, he did convert to sinus rhythm and is now AV paced.  He is overall feeling well without major complaints at this time.   Past Medical History:  Diagnosis Date   Acquired bilateral hammer toes    Arthralgia of left temporomandibular joint    Atrial fibrillation ([place]) 05/2016   Bladder cancer ([place])    Bradycardia    LOW HEART RATE   Calculus of ureter    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   Cellulitis of right lower limb    CHF (congestive heart failure) ([place]) 02/2018   Chronic atrial fibrillation ([facility])    Corns and callosities    Deviated septum 07/01/2018   Deviated septum 07/01/2018   Diabetes mellitus due to underlying condition with unspecified  complications ([place]) 37/62/8315   Disturbances of salivary secretion    Epistaxis    Essential hypertension    Essential tremor    H pylori ulcer    H pylori ulcer    Herpes zoster with nervous system complication 17/61/6073   Hesitancy of micturition    Hyperlipidemia    Hypothyroidism    Impacted cerumen, bilateral    Jaw pain    Lesion of femoral nerve 04/30/2013   Localized edema    Male erectile disorder    Malignant neoplasm of overlapping sites of bladder ([place])    Nasal congestion 07/01/2018   Nasal turbinate hypertrophy 07/01/2018   Obstructive sleep apnea 01/17/2017   Other constipation    Other fatigue    Overweight    Pacemaker reprogramming/check 02/12/2017   Pain in right finger(s)    Pain in right lower leg    Paroxysmal atrial fibrillation ([place]) 06/08/2016   Peptic ulcer disease    Persistent atrial fibrillation ([place]) 06/01/2017   Primary insomnia    Renal stones    Renal stones    Secondary hypercoagulable state ([place]) 06/17/2020   Shingles 7106   WITH COMPLICATIONS (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)   Shingles 06/2012   Shortness of breath    Sick sinus syndrome (H[facility])    Sinus bradycardia 03/07/2017   Sleep apnea    dx 08/2015, CPAP   Spontaneous ecchymoses    Status post ablation of atrial fibrillation 09/16/2020   Testicular hypofunction    Thoracic or lumbosacral neuritis or radiculitis  06/03/2013   Trigger middle finger of right hand 11/28/2019   Trigger middle finger of right hand 11/28/2019   Type 2 diabetes mellitus with circulatory disorder, 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[place]) 01/17/2017   Past Surgical History:  Procedure Laterality Date   ABLATION  06/2017   ATRIAL FIBRILLATION ABLATION N/A 05/21/2020   Procedure: ATRIAL FIBRILLATION ABLATION;  Surgeon: Constance Haw, MD;  Location: [facility];  Service: Cardiovascular;  Laterality: N/A;   CARDIOVERSION N/A 03/22/2018   Procedure:  CARDIOVERSION;  Surgeon: Buford Dresser, MD;  Location: [facility] ENDOSCOPY;  Service: Cardiovascular;  Laterality: N/A;   CARDIOVERSION N/A 07/08/2020   Procedure: CARDIOVERSION;  Surgeon: Jerline Pain, MD;  Location: [facility] ENDOSCOPY;  Service: Cardiovascular;  Laterality: N/A;   HEMIARTHROPLASTY HIP  10/31/2013   IT HIP BIPOLAR   LUMBAR SPINE SURGERY     PACEMAKER PLACEMENT  01/29/2017   SHOULDER SURGERY Right      Current Outpatient Medications  Medication Sig Dispense Refill   amiodarone (PACERONE) 200 MG tablet TAKE ONE TABLET BY MOUTH EVERY DAY 90 tablet 1   atorvastatin (LIPITOR) 10 MG tablet Take 1 tablet (10 mg total) by mouth every evening. 90 tablet 2   B-D UF III MINI PEN NEEDLES 31G X 5 MM MISC SMARTSIG:injection Daily     carvedilol (COREG) 12.5 MG tablet Take 1 tablet (12.5 mg total) by mouth 2 (two) times daily. 180 tablet 2   dapagliflozin propanediol (FARXIGA) 10 MG TABS tablet Take 1 tablet (10 mg total) by mouth daily. 90 tablet 0   famotidine (PEPCID) 40 MG tablet Take 1 tablet (40 mg total) by mouth daily. 90 tablet 1   FINERENONE PO Take by mouth.     gabapentin (NEURONTIN) 300 MG capsule Take 1 capsule (300 mg total) by mouth 3 (three) times daily. 270 capsule 1   glipiZIDE (GLUCOTROL XL) 10 MG 24 hr tablet TAKE ONE TABLET BY MOUTH TWICE DAILY 180 tablet 1   insulin glargine, 1 Unit Dial, (TOUJEO SOLOSTAR) 300 UNIT/ML Solostar Pen Inject 20 Units into the skin daily at 6 (six) AM. 1.5 mL 1   levothyroxine (SYNTHROID) 150 MCG tablet TAKE ONE TABLET BY MOUTH EVERY MORNING 90 tablet 1   neomycin-polymyxin b-dexamethasone (MAXITROL) 3.5-10000-0.1 SUSP Place into the right eye.     testosterone cypionate (DEPOTESTOSTERONE CYPIONATE) 200 MG/ML injection Inject 1 mL (200 mg total) into the muscle every 14 (fourteen) days. INJECT 1 MILLILITER IN THE MUSCLE EVERY 2 WEEKS 10 mL 0   XARELTO 20 MG TABS tablet TAKE ONE TABLET BY MOUTH EVERY EVENING 90 tablet 1   No current  facility-administered medications for this visit.    Allergies:   Propranolol, Canagliflozin, Clonidine derivatives, Hydralazine hcl, Metformin and related, Topiramate, Ambien [zolpidem], and Ozempic (0.25 or 0.5 mg-dose) [semaglutide(0.25 or 0.'5mg'$ -dos)]   Social History:  The patient  reports that he has never smoked. He has never used smokeless tobacco. He reports that he does not currently use alcohol. He reports that he does not use drugs.   Family History:  The patient's family history includes Arthritis in an other family member; Congestive Heart Failure in an other family member; Diabetes type II in an other family member; Heart Problems in his father; Heart attack in his mother; Hyperlipidemia in an other family member; Transient ischemic attack in his mother; Tuberculosis in his brother.   ROS:  Please see the history of present illness.   Otherwise, review of systems is positive  for none.   All other systems are reviewed and negative.   PHYSICAL EXAM: VS:  BP 124/68   Pulse 65   Ht 6' (1.829 m)   Wt 246 lb 3.2 oz (111.7 kg)   SpO2 96%   BMI 33.39 kg/m  , BMI Body mass index is 33.39 kg/m. GEN: Well nourished, well developed, in no acute distress  HEENT: normal  Neck: no JVD, carotid bruits, or masses Cardiac: RRR; no murmurs, rubs, or gallops,no edema  Respiratory:  clear to auscultation bilaterally, normal work of breathing GI: soft, nontender, nondistended, + BS MS: no deformity or atrophy  Skin: warm and dry, device site well healed Neuro:  Strength and sensation are intact Psych: euthymic mood, full affect  EKG:  EKG is ordered today. Personal review of the ekg ordered shows AV paced  Personal review of the device interrogation today. Results in [place]: 11/01/2021: ALT 22; BUN 25; Creatinine, Ser 1.72; Hemoglobin 17.0; Platelets 156; Potassium 4.7; Sodium 141; TSH 2.170    Lipid Panel     Component Value Date/Time   CHOL 118 11/01/2021 0913    TRIG 75 11/01/2021 0913   HDL 44 11/01/2021 0913   CHOLHDL 2.7 11/01/2021 0913   LDLCALC 59 11/01/2021 0913     Wt Readings from Last 3 Encounters:  01/23/22 246 lb 3.2 oz (111.7 kg)  11/24/21 235 lb (106.6 kg)  11/01/21 229 lb (103.9 kg)      Other studies Reviewed: Additional studies/ records that were reviewed today include: TTE 03/01/18  Review of the above records today demonstrates:  - Left ventricle: The cavity size was normal. Wall thickness was   increased in a pattern of mild LVH. Systolic function was mildly   reduced. The estimated ejection fraction was in the range of 45%   to 50%. Diffuse hypokinesis. - Left atrium: The atrium was mildly dilated. Volume/bsa, S: 37.6   ml/m^2. - Right ventricle: The cavity size was moderately dilated. Wall   thickness was normal. Pacer wire or catheter noted in right   ventricle.   ASSESSMENT AND PLAN:  1.  Persistent atrial fibrillation: Currently on Xarelto 20 mg daily, amiodarone 200 mg daily.  High risk medication monitoring amiodarone.  Post cryoablation in 2019 with RF ablation 05/21/2020.  CHA2DS2-VASc of 4.  He was previously in atrial fibrillation with significant shortness of breath but converted to sinus rhythm 3 days ago.  He feels much improved with less shortness of breath and fatigue.  Paul Kent continue with current management.  2.  Hypertension: currently well controlled  3.  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  Device function appropriately.  No changes.  4.  Obstructive sleep apnea: CPAP appliance encouraged  5.  Hyperlipidemia: Continue Lipitor per primary cardiology  6.  Secondary hypercoagulable state: Currently on Xarelto for atrial fibrillation as above   Current medicines are reviewed at length with the patient today.   The patient does not have concerns regarding his medicines.  The following changes were made today: None  Labs/ tests ordered today  include:  Orders Placed This Encounter  Procedures   EKG 12-Lead     Disposition:   FU with Alexa Golebiewski 6 months  Signed, Mandie Crabbe [name], MD  01/23/2022 10:40 AM     [facility] [address] [place] [address] [phone] (office) [phone] (fax)

## 2022-01-24 DIAGNOSIS — E11621 Type 2 diabetes mellitus with foot ulcer: Secondary | ICD-10-CM | POA: Diagnosis not present

## 2022-01-24 DIAGNOSIS — L97509 Non-pressure chronic ulcer of other part of unspecified foot with unspecified severity: Secondary | ICD-10-CM | POA: Diagnosis not present

## 2022-01-24 DIAGNOSIS — L89613 Pressure ulcer of right heel, stage 3: Secondary | ICD-10-CM | POA: Diagnosis not present

## 2022-01-25 ENCOUNTER — Ambulatory Visit: Payer: Medicare Other | Admitting: Cardiology

## 2022-01-25 DIAGNOSIS — N401 Enlarged prostate with lower urinary tract symptoms: Secondary | ICD-10-CM | POA: Diagnosis not present

## 2022-01-25 DIAGNOSIS — B3789 Other sites of candidiasis: Secondary | ICD-10-CM | POA: Diagnosis not present

## 2022-01-25 DIAGNOSIS — Z8551 Personal history of malignant neoplasm of bladder: Secondary | ICD-10-CM | POA: Diagnosis not present

## 2022-02-01 DIAGNOSIS — E11621 Type 2 diabetes mellitus with foot ulcer: Secondary | ICD-10-CM | POA: Diagnosis not present

## 2022-02-01 DIAGNOSIS — L97509 Non-pressure chronic ulcer of other part of unspecified foot with unspecified severity: Secondary | ICD-10-CM | POA: Diagnosis not present

## 2022-02-01 DIAGNOSIS — L89613 Pressure ulcer of right heel, stage 3: Secondary | ICD-10-CM | POA: Diagnosis not present

## 2022-02-02 ENCOUNTER — Other Ambulatory Visit: Payer: Self-pay | Admitting: Family Medicine

## 2022-02-02 DIAGNOSIS — E1159 Type 2 diabetes mellitus with other circulatory complications: Secondary | ICD-10-CM

## 2022-02-08 DIAGNOSIS — E11621 Type 2 diabetes mellitus with foot ulcer: Secondary | ICD-10-CM | POA: Diagnosis not present

## 2022-02-08 DIAGNOSIS — L97509 Non-pressure chronic ulcer of other part of unspecified foot with unspecified severity: Secondary | ICD-10-CM | POA: Diagnosis not present

## 2022-02-08 DIAGNOSIS — L89613 Pressure ulcer of right heel, stage 3: Secondary | ICD-10-CM | POA: Diagnosis not present

## 2022-02-12 NOTE — Progress Notes (Unsigned)
Subjective:  Patient ID: Paul Kent, male    DOB: 1940/07/20  Age: 81 y.o. MRN: [id number]  Chief Complaint  Patient presents with   Atrial Fibrillation   Hypertension   Diabetes   HPI  Diabetes:  Complications: neuropathy Glucose checking:once daily in am.  Glucose logs: 110-130 Hypoglycemia: in the middle of the night due to symptoms consistent with hypoglycemia.  Most recent A1C: 8.0% Current medications: Glipizide 10 mg twice a day. Stopped farxiga and Toujeo 20 U daily off for 2 weeks ago (concerned that sugars were low).  Last Eye Exam: 01/16/2022 Foot checks: ulcer right heel (wound care visits weekly).    Hyperlipidemia: Current medications: Atorvastatin 10 mg daily.  Hypertension: Current medications: Carvedilol 12.5 mg twice a day.  GERD: Famotidine 40 mg daily.  Afib: Taking Amiodarone 200 mg everyday, xarelto 20 mg every evening.  Hypothyroidism: He takes Levothyroxine 150 mcg every morning.  Diet: Back on optavia.  Exercise: still going to gym.  Current Outpatient Medications on File Prior to Visit  Medication Sig Dispense Refill   amiodarone (PACERONE) 200 MG tablet TAKE ONE TABLET BY MOUTH EVERY DAY 90 tablet 1   atorvastatin (LIPITOR) 10 MG tablet Take 1 tablet (10 mg total) by mouth every evening. 90 tablet 2   carvedilol (COREG) 12.5 MG tablet Take 1 tablet (12.5 mg total) by mouth 2 (two) times daily. 180 tablet 2   FINERENONE PO Take by mouth.     gabapentin (NEURONTIN) 300 MG capsule Take 1 capsule (300 mg total) by mouth 3 (three) times daily. 270 capsule 1   glipiZIDE (GLUCOTROL XL) 10 MG 24 hr tablet TAKE ONE TABLET BY MOUTH TWICE DAILY 180 tablet 1   levothyroxine (SYNTHROID) 150 MCG tablet TAKE ONE TABLET BY MOUTH EVERY MORNING 90 tablet 1   XARELTO 20 MG TABS tablet TAKE ONE TABLET BY MOUTH EVERY EVENING 90 tablet 1   B-D UF III MINI PEN NEEDLES 31G X 5 MM MISC SMARTSIG:injection Daily     dapagliflozin propanediol (FARXIGA) 10 MG TABS  tablet Take 1 tablet (10 mg total) by mouth daily. (Patient not taking: Reported on 02/13/2022) 90 tablet 0   famotidine (PEPCID) 40 MG tablet Take 1 tablet (40 mg total) by mouth daily. (Patient not taking: Reported on 02/13/2022) 90 tablet 1   insulin glargine, 1 Unit Dial, (TOUJEO SOLOSTAR) 300 UNIT/ML Solostar Pen Inject 20 Units into the skin daily at 6 (six) AM. (Patient not taking: Reported on 02/13/2022) 1.5 mL 1   testosterone cypionate (DEPOTESTOSTERONE CYPIONATE) 200 MG/ML injection Inject 1 mL (200 mg total) into the muscle every 14 (fourteen) days. INJECT 1 MILLILITER IN THE MUSCLE EVERY 2 WEEKS (Patient not taking: Reported on 02/13/2022) 10 mL 0   No current facility-administered medications on file prior to visit.   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   Cellulitis of right lower limb    CHF (congestive heart failure) ([place]) 02/2018   Chronic atrial fibrillation (HCC)    Corns and callosities    Deviated septum 07/01/2018   Deviated septum 07/01/2018   Diabetes mellitus due to underlying condition with unspecified complications ([place]) 50/93/2671   Disturbances of salivary secretion    Epistaxis  Essential hypertension    Essential tremor    H pylori ulcer    H pylori ulcer    Herpes zoster with nervous system complication 85/46/2703   Hesitancy of micturition    Hyperlipidemia    Hypothyroidism    Impacted cerumen, bilateral    Jaw pain    Lesion of femoral nerve 04/30/2013   Localized edema    Male erectile disorder    Malignant neoplasm of overlapping sites of bladder ([place])    Nasal congestion 07/01/2018   Nasal turbinate hypertrophy 07/01/2018   Obstructive sleep apnea 01/17/2017   Other constipation    Other fatigue     Overweight    Pacemaker reprogramming/check 02/12/2017   Pain in right finger(s)    Pain in right lower leg    Paroxysmal atrial fibrillation ([place]) 06/08/2016   Peptic ulcer disease    Persistent atrial fibrillation ([place]) 06/01/2017   Primary insomnia    Renal stones    Renal stones    Secondary hypercoagulable state ([place]) 06/17/2020   Shingles 5009   WITH COMPLICATIONS (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)   Shingles 06/2012   Shortness of breath    Sick sinus syndrome ([place])    Sinus bradycardia 03/07/2017   Sleep apnea    dx 08/2015, CPAP   Spontaneous ecchymoses    Status post ablation of atrial fibrillation 09/16/2020   Testicular hypofunction    Thoracic or lumbosacral neuritis or radiculitis 06/03/2013   Trigger middle finger of right hand 11/28/2019   Trigger middle finger of right hand 11/28/2019   Type 2 diabetes mellitus with circulatory disorder, 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[place]) 01/17/2017   Past Surgical History:  Procedure Laterality Date   ABLATION  06/2017   ATRIAL FIBRILLATION ABLATION N/A 05/21/2020   Procedure: ATRIAL FIBRILLATION ABLATION;  Surgeon: Constance Haw, MD;  Location: [facility];  Service: Cardiovascular;  Laterality: N/A;   CARDIOVERSION N/A 03/22/2018   Procedure: CARDIOVERSION;  Surgeon: Buford Dresser, MD;  Location: [facility] ENDOSCOPY;  Service: Cardiovascular;  Laterality: N/A;   CARDIOVERSION N/A 07/08/2020   Procedure: CARDIOVERSION;  Surgeon: Jerline Pain, MD;  Location: [facility] ENDOSCOPY;  Service: Cardiovascular;  Laterality: N/A;   HEMIARTHROPLASTY HIP  10/31/2013   IT HIP BIPOLAR   LUMBAR SPINE SURGERY     PACEMAKER PLACEMENT  01/29/2017   SHOULDER SURGERY Right     Family History  Problem Relation Age of Onset   Heart attack Mother    Transient ischemic attack Mother    Heart Problems Father    Tuberculosis Brother    Diabetes type II Other    Hyperlipidemia Other    Congestive  Heart Failure Other    Arthritis Other    Social History   Socioeconomic History   Marital status: Married    Spouse name: [name]   Number of children: 3   Years of education: Not on file   Highest education level: Not on file  Occupational History   Occupation: RETIRED    Comment: school principal  Tobacco Use   Smoking status: Never   Smokeless tobacco: Never  Vaping Use   Vaping Use: Never used  Substance and Sexual Activity   Alcohol use: Not Currently   Drug use: Never   Sexual activity: Not on file  Other Topics Concern   Not on file  Social History Narrative   Lives with wife   Social Determinants of Health   Financial Resource Strain:  Low Risk  (11/24/2021)   Overall Financial Resource Strain (CARDIA)    Difficulty of Paying Living Expenses: Not hard at all  Food Insecurity: No Food Insecurity (11/24/2021)   Hunger Vital Sign    Worried About Running Out of Food in the Last Year: Never true    Ran Out of Food in the Last Year: Never true  Transportation Needs: No Transportation Needs (11/24/2021)   PRAPARE - Hydrologist (Medical): No    Lack of Transportation (Non-Medical): No  Physical Activity: Sufficiently Active (11/24/2021)   Exercise Vital Sign    Days of Exercise per Week: 4 days    Minutes of Exercise per Session: 60 min  Stress: No Stress Concern Present (11/24/2021)   [place]    Feeling of Stress : Not at all  Social Connections: [place] (11/24/2021)   Social Connection and Isolation Panel [NHANES]    Frequency of Communication with Friends and Family: More than three times a week    Frequency of Social Gatherings with Friends and Family: More than three times a week    Attends Religious Services: More than 4 times per year    Active Member of [employer] or Organizations: Yes    Attends Music therapist: More than 4 times per year    Marital  Status: Married    Review of Systems  Constitutional:  Negative for chills and fever.  HENT:  Negative for congestion, rhinorrhea and sore throat.   Respiratory:  Negative for cough and shortness of breath.   Cardiovascular:  Negative for chest pain and palpitations.  Gastrointestinal:  Negative for abdominal pain, constipation, diarrhea, nausea and vomiting.  Genitourinary:  Negative for dysuria and urgency.  Musculoskeletal:  Negative for arthralgias (left shoulder pain since fall about 3 weeks ago), back pain and myalgias.  Skin:        Ulcer right heel (under the care of Wound Care).  Neurological:  Positive for numbness (burning senation). Negative for dizziness and headaches.       Balance issues   Psychiatric/Behavioral:  Negative for dysphoric mood. The patient is not nervous/anxious.      Objective:  BP 108/74   Pulse 78   Temp (!) 97.4 F (36.3 C)   Resp 18   Ht 6' (1.829 m)   Wt 233 lb (105.7 kg)   BMI 31.60 kg/m      02/13/2022    9:03 AM 01/23/2022   10:09 AM 11/24/2021    8:55 AM  BP/Weight  Systolic BP 124 580 998  Diastolic BP 74 68 72  Wt. (Lbs) 233 246.2 235  BMI 31.6 kg/m2 33.39 kg/m2 31.87 kg/m2    Physical Exam Vitals reviewed.  Constitutional:      Appearance: Normal appearance.  Neck:     Vascular: No carotid bruit.  Cardiovascular:     Rate and Rhythm: Normal rate and regular rhythm.     Pulses: Normal pulses.     Heart sounds: Normal heart sounds.  Pulmonary:     Effort: Pulmonary effort is normal.     Breath sounds: Normal breath sounds. No wheezing, rhonchi or rales.  Abdominal:     General: Bowel sounds are normal.     Palpations: Abdomen is soft.     Tenderness: There is no abdominal tenderness.  Neurological:     Mental Status: He is alert.  Psychiatric:  Mood and Affect: Mood normal.        Behavior: Behavior normal.     Diabetic Foot Exam - Simple   No data filed      Lab Results  Component Value Date   WBC 3.9  02/13/2022   HGB 15.8 02/13/2022   HCT 45.9 02/13/2022   PLT 138 (L) 02/13/2022   GLUCOSE 111 (H) 02/13/2022   CHOL 113 02/13/2022   TRIG 80 02/13/2022   HDL 51 02/13/2022   LDLCALC 46 02/13/2022   ALT 38 02/13/2022   AST 37 02/13/2022   NA 140 02/13/2022   K 4.8 02/13/2022   CL 103 02/13/2022   CREATININE 1.62 (H) 02/13/2022   BUN 31 (H) 02/13/2022   CO2 22 02/13/2022   TSH 2.170 11/01/2021   HGBA1C 7.1 (H) 02/13/2022   MICROALBUR 80 01/20/2021      Assessment & Plan:   Problem List Items Addressed This Visit       Cardiovascular and Mediastinum   Chronic atrial fibrillation ([place]) - Primary    The current medical regimen is effective;  continue present plan and medications. Continue with xarelto 20 mg daily, amiodarone 200 mg  Daily.      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  No changes to medicines. Carvedilol 12.5 mg twice a day. Continue to work on eating a healthy diet and exercise.  Labs drawn today.       Relevant Orders   Comprehensive metabolic panel (Completed)   CBC with Differential/Platelet (Completed)     Respiratory   OSA on CPAP    Uses CPAP with oxygen at 2 L.      Chronic respiratory failure with hypoxia (HCC)    Continue with oxygen 2L at night.        Digestive   Gastroesophageal reflux disease without esophagitis    The current medical regimen is effective;  continue present plan and medications. Continue pepcid 40 mg daily.         Endocrine   Hypothyroidism    Previously well controlled Continue Synthroid at current dose  Recheck TSH and adjust Synthroid as indicated       Testicular hypofunction    The current medical regimen is effective;  continue present plan and medications.      Relevant Orders   Testosterone,Free and Total (Completed)   Diabetic polyneuropathy associated with type 2 diabetes mellitus ([place])    Control:  good Recommend check sugars fasting daily. Recommend check feet daily. Recommend annual eye exams. Medicines: Glipizide 10 mg twice a day. Continue to work on eating a healthy diet and exercise.  Labs drawn today.        Relevant Orders   Hemoglobin A1c (Completed)   Microalbumin / creatinine urine ratio (Completed)     Nervous and Auditory   Essential tremor    The current medical regimen is effective;  continue present plan and medications. Continue carvedilol 12.5 mg twice daily.         Genitourinary   Enlarged prostate with urinary obstruction    Check labs.        Other   Cardiac pacemaker in situ    Management per specialist.      Hyperlipidemia    Well controlled.  No changes to medicines. Atorvastatin 10 mg daily. Continue to work on eating a healthy diet and exercise.  Labs drawn today.  Relevant Orders   Lipid panel (Completed)   Other Visit Diagnoses     Chronic pain of left knee       Relevant Orders   DG Knee Complete 4 Views Left   Chronic pain of right knee       Relevant Orders   DG Knee Complete 4 Views Right     .  No orders of the defined types were placed in this encounter.   Orders Placed This Encounter  Procedures   DG Knee Complete 4 Views Right   DG Knee Complete 4 Views Left   Comprehensive metabolic panel   Hemoglobin A1c   Lipid panel   CBC with Differential/Platelet   Testosterone,Free and Total   Microalbumin / creatinine urine ratio    Follow-up: Return in about 3 months (around 05/16/2022) for chronic fasting.  An After Visit Summary was printed and given to the patient.  [name], MD [facility] [phone]

## 2022-02-13 ENCOUNTER — Ambulatory Visit (INDEPENDENT_AMBULATORY_CARE_PROVIDER_SITE_OTHER): Payer: Medicare Other | Admitting: Family Medicine

## 2022-02-13 VITALS — BP 108/74 | HR 78 | Temp 97.4°F | Resp 18 | Ht 72.0 in | Wt 233.0 lb

## 2022-02-13 DIAGNOSIS — I130 Hypertensive heart and chronic kidney disease with heart failure and stage 1 through stage 4 chronic kidney disease, or unspecified chronic kidney disease: Secondary | ICD-10-CM

## 2022-02-13 DIAGNOSIS — N138 Other obstructive and reflux uropathy: Secondary | ICD-10-CM

## 2022-02-13 DIAGNOSIS — G250 Essential tremor: Secondary | ICD-10-CM

## 2022-02-13 DIAGNOSIS — I482 Chronic atrial fibrillation, unspecified: Secondary | ICD-10-CM | POA: Diagnosis not present

## 2022-02-13 DIAGNOSIS — K219 Gastro-esophageal reflux disease without esophagitis: Secondary | ICD-10-CM

## 2022-02-13 DIAGNOSIS — G4733 Obstructive sleep apnea (adult) (pediatric): Secondary | ICD-10-CM

## 2022-02-13 DIAGNOSIS — N401 Enlarged prostate with lower urinary tract symptoms: Secondary | ICD-10-CM

## 2022-02-13 DIAGNOSIS — E782 Mixed hyperlipidemia: Secondary | ICD-10-CM

## 2022-02-13 DIAGNOSIS — E291 Testicular hypofunction: Secondary | ICD-10-CM

## 2022-02-13 DIAGNOSIS — E039 Hypothyroidism, unspecified: Secondary | ICD-10-CM | POA: Diagnosis not present

## 2022-02-13 DIAGNOSIS — Z950 Presence of cardiac pacemaker: Secondary | ICD-10-CM

## 2022-02-13 DIAGNOSIS — G8929 Other chronic pain: Secondary | ICD-10-CM

## 2022-02-13 DIAGNOSIS — J9611 Chronic respiratory failure with hypoxia: Secondary | ICD-10-CM

## 2022-02-13 DIAGNOSIS — E1142 Type 2 diabetes mellitus with diabetic polyneuropathy: Secondary | ICD-10-CM | POA: Diagnosis not present

## 2022-02-13 DIAGNOSIS — Z9989 Dependence on other enabling machines and devices: Secondary | ICD-10-CM

## 2022-02-13 DIAGNOSIS — M25561 Pain in right knee: Secondary | ICD-10-CM

## 2022-02-13 DIAGNOSIS — M25562 Pain in left knee: Secondary | ICD-10-CM

## 2022-02-14 LAB — MICROALBUMIN / CREATININE URINE RATIO
Creatinine, Urine: 79.7 mg/dL
Microalb/Creat Ratio: 57 mg/g creat — ABNORMAL HIGH (ref 0–29)
Microalbumin, Urine: 45.8 ug/mL

## 2022-02-16 DIAGNOSIS — L89613 Pressure ulcer of right heel, stage 3: Secondary | ICD-10-CM | POA: Diagnosis not present

## 2022-02-16 DIAGNOSIS — L97509 Non-pressure chronic ulcer of other part of unspecified foot with unspecified severity: Secondary | ICD-10-CM | POA: Diagnosis not present

## 2022-02-16 DIAGNOSIS — E11621 Type 2 diabetes mellitus with foot ulcer: Secondary | ICD-10-CM | POA: Diagnosis not present

## 2022-02-16 DIAGNOSIS — M25512 Pain in left shoulder: Secondary | ICD-10-CM | POA: Diagnosis not present

## 2022-02-18 NOTE — Assessment & Plan Note (Signed)
Management per specialist. 

## 2022-02-18 NOTE — Assessment & Plan Note (Signed)
Well controlled.  No changes to medicines. Carvedilol 12.5 mg twice a day. Continue to work on eating a healthy diet and exercise.  Labs drawn today.

## 2022-02-18 NOTE — Assessment & Plan Note (Signed)
Previously well controlled Continue Synthroid at current dose  Recheck TSH and adjust Synthroid as indicated   

## 2022-02-18 NOTE — Assessment & Plan Note (Signed)
Continue with oxygen 2 L at night.  

## 2022-02-18 NOTE — Assessment & Plan Note (Signed)
Check labs 

## 2022-02-18 NOTE — Assessment & Plan Note (Signed)
Control: good Recommend check sugars fasting daily. Recommend check feet daily. Recommend annual eye exams. Medicines: Glipizide 10 mg twice a day. Continue to work on eating a healthy diet and exercise.  Labs drawn today.

## 2022-02-18 NOTE — Assessment & Plan Note (Signed)
Uses CPAP with oxygen at 2 L.

## 2022-02-18 NOTE — Assessment & Plan Note (Signed)
The current medical regimen is effective;  continue present plan and medications.  

## 2022-02-18 NOTE — Assessment & Plan Note (Signed)
Well controlled.  No changes to medicines. Atorvastatin 10 mg daily. Continue to work on eating a healthy diet and exercise.  Labs drawn today.   

## 2022-02-18 NOTE — Assessment & Plan Note (Signed)
The current medical regimen is effective;  continue present plan and medications. Continue with xarelto 20 mg daily, amiodarone 200 mg  Daily.

## 2022-02-20 ENCOUNTER — Encounter: Payer: Self-pay | Admitting: Family Medicine

## 2022-02-20 LAB — HEMOGLOBIN A1C
Est. average glucose Bld gHb Est-mCnc: 157 mg/dL
Hgb A1c MFr Bld: 7.1 % — ABNORMAL HIGH (ref 4.8–5.6)

## 2022-02-20 LAB — CBC WITH DIFFERENTIAL/PLATELET
Basophils Absolute: 0.1 10*3/uL (ref 0.0–0.2)
Basos: 2 %
EOS (ABSOLUTE): 0.1 10*3/uL (ref 0.0–0.4)
Eos: 3 %
Hematocrit: 45.9 % (ref 37.5–51.0)
Hemoglobin: 15.8 g/dL (ref 13.0–17.7)
Immature Grans (Abs): 0 10*3/uL (ref 0.0–0.1)
Immature Granulocytes: 0 %
Lymphocytes Absolute: 0.8 10*3/uL (ref 0.7–3.1)
Lymphs: 21 %
MCH: 33.5 pg — ABNORMAL HIGH (ref 26.6–33.0)
MCHC: 34.4 g/dL (ref 31.5–35.7)
MCV: 98 fL — ABNORMAL HIGH (ref 79–97)
Monocytes Absolute: 0.5 10*3/uL (ref 0.1–0.9)
Monocytes: 13 %
Neutrophils Absolute: 2.4 10*3/uL (ref 1.4–7.0)
Neutrophils: 61 %
Platelets: 138 10*3/uL — ABNORMAL LOW (ref 150–450)
RBC: 4.71 x10E6/uL (ref 4.14–5.80)
RDW: 12.5 % (ref 11.6–15.4)
WBC: 3.9 10*3/uL (ref 3.4–10.8)

## 2022-02-20 LAB — COMPREHENSIVE METABOLIC PANEL
ALT: 38 IU/L (ref 0–44)
AST: 37 IU/L (ref 0–40)
Albumin/Globulin Ratio: 1.6 (ref 1.2–2.2)
Albumin: 4.2 g/dL (ref 3.7–4.7)
Alkaline Phosphatase: 127 IU/L — ABNORMAL HIGH (ref 44–121)
BUN/Creatinine Ratio: 19 (ref 10–24)
BUN: 31 mg/dL — ABNORMAL HIGH (ref 8–27)
Bilirubin Total: 1 mg/dL (ref 0.0–1.2)
CO2: 22 mmol/L (ref 20–29)
Calcium: 8.9 mg/dL (ref 8.6–10.2)
Chloride: 103 mmol/L (ref 96–106)
Creatinine, Ser: 1.62 mg/dL — ABNORMAL HIGH (ref 0.76–1.27)
Globulin, Total: 2.7 g/dL (ref 1.5–4.5)
Glucose: 111 mg/dL — ABNORMAL HIGH (ref 70–99)
Potassium: 4.8 mmol/L (ref 3.5–5.2)
Sodium: 140 mmol/L (ref 134–144)
Total Protein: 6.9 g/dL (ref 6.0–8.5)
eGFR: 42 mL/min/{1.73_m2} — ABNORMAL LOW (ref >59)

## 2022-02-20 LAB — TESTOSTERONE,FREE AND TOTAL
Testosterone, Free: 1.1 pg/mL — ABNORMAL LOW (ref 6.6–18.1)
Testosterone: 152 ng/dL — ABNORMAL LOW (ref 264–916)

## 2022-02-20 LAB — LIPID PANEL
Chol/HDL Ratio: 2.2 ratio (ref 0.0–5.0)
Cholesterol, Total: 113 mg/dL (ref 100–199)
HDL: 51 mg/dL (ref >39)
LDL Chol Calc (NIH): 46 mg/dL (ref 0–99)
Triglycerides: 80 mg/dL (ref 0–149)
VLDL Cholesterol Cal: 16 mg/dL (ref 5–40)

## 2022-02-21 NOTE — Progress Notes (Signed)
Blood count abnormal. Platelets little low, but fairly stable. Liver function normal.  Kidney function abnormal, but stable.  Cholesterol:good HBA1C: improved from 8 down to 7.1.  Spilling protein in urine still, but improved.  Testosterone levels low. Please ask if patient has stopped his testosterone shots. I would recommend restart. Pend new rx if patient wishes to restart.

## 2022-02-22 DIAGNOSIS — L97419 Non-pressure chronic ulcer of right heel and midfoot with unspecified severity: Secondary | ICD-10-CM | POA: Diagnosis not present

## 2022-02-22 DIAGNOSIS — E11621 Type 2 diabetes mellitus with foot ulcer: Secondary | ICD-10-CM | POA: Diagnosis not present

## 2022-02-23 ENCOUNTER — Other Ambulatory Visit: Payer: Self-pay | Admitting: Family Medicine

## 2022-02-23 DIAGNOSIS — E1159 Type 2 diabetes mellitus with other circulatory complications: Secondary | ICD-10-CM

## 2022-03-01 DIAGNOSIS — Z0389 Encounter for observation for other suspected diseases and conditions ruled out: Secondary | ICD-10-CM | POA: Diagnosis not present

## 2022-03-01 DIAGNOSIS — M25512 Pain in left shoulder: Secondary | ICD-10-CM | POA: Diagnosis not present

## 2022-03-06 ENCOUNTER — Other Ambulatory Visit: Payer: Self-pay | Admitting: Family Medicine

## 2022-03-06 DIAGNOSIS — E1159 Type 2 diabetes mellitus with other circulatory complications: Secondary | ICD-10-CM

## 2022-03-08 DIAGNOSIS — E11621 Type 2 diabetes mellitus with foot ulcer: Secondary | ICD-10-CM | POA: Diagnosis not present

## 2022-03-08 DIAGNOSIS — L97419 Non-pressure chronic ulcer of right heel and midfoot with unspecified severity: Secondary | ICD-10-CM | POA: Diagnosis not present

## 2022-03-09 ENCOUNTER — Ambulatory Visit (INDEPENDENT_AMBULATORY_CARE_PROVIDER_SITE_OTHER): Payer: Medicare Other

## 2022-03-09 DIAGNOSIS — I495 Sick sinus syndrome: Secondary | ICD-10-CM | POA: Diagnosis not present

## 2022-03-09 LAB — CUP PACEART REMOTE DEVICE CHECK
Date Time Interrogation Session: 20230921083120
Implantable Lead Implant Date: 20180813
Implantable Lead Implant Date: 20180813
Implantable Lead Location: 753859
Implantable Lead Location: 753860
Implantable Lead Model: 377
Implantable Lead Model: 377
Implantable Lead Serial Number: 49893169
Implantable Lead Serial Number: 50011411
Implantable Pulse Generator Implant Date: 20180813
Pulse Gen Model: 407145
Pulse Gen Serial Number: 69158272

## 2022-03-10 ENCOUNTER — Telehealth: Payer: Self-pay

## 2022-03-10 NOTE — Telephone Encounter (Signed)
Received the following alerts from [employer]:  Long atrial episode detected (ongoing at end of mon. interv.) 1 episode(s) detected between Mar 09, 2022 1:52:00 AM and Mar 10, 2022 1:52:00 AM -  The programmed episode duration limit was 12h 35mn  Atrial burden above limit (> 25% of day) Above limit since Mar 09, 2022 1:52:00 AM -  Last value 100% of day measured on Mar 10, 2022 1:52:00 AM  Reviewed Dr. CCurt BearsOV note from 01/23/22.  Patient had been very sx with periods of A FIB (SOB mainly with exertion) prior to 01/23/22 appt.  He did convert back to SR just before time of visit and was asymptomatic in August.  He maintained SR (burden at 0% daily until 2 days ago).  Patient is now 60-100% daily AT/AF burden (graph trend below). Ventricular rates are controlled.   I called patient today to check on him.  He does report increased SOB and some dizziness with position change in recent days but overall feels he is okay.  He did miss a few days last week of Xarelto for an MRI procedure.  He does continue on amiodarone and carvedilol without any missed doses. Patient states he has not been exercising as of late and plans to restart his regimen tomorrow as he feels this helps.   Making note of patient's AT/AF burden up tick and symptomology for Dr. CCurt Bears.  We do not have a current EGM to show the rhythm presenting 9/21-present.  Last  EGM recording on 9/20 shows a ATACH/Aflutter rate of mean 202 bpm with controlled V rate. Patient is aware to go to ER if any urgent emergent symptoms but was given device clinic number to call if ongoing concerns.  I did reassure patient that he has a history of persistent Afib, so going in and out of this rhythm is to be expected.  The main thing is that he is taking his medication (Xarelto, Amio and Carvedilol) daily as prescribed and lets [place] if any increased difficulty with his symptoms.    Patient verbalizes understanding and thanks [place]for the check-in.  Question  need to refer to Afib clinic if patient is in agreement?  Thanks.

## 2022-03-13 NOTE — Telephone Encounter (Signed)
Received another alert today form [employer] (this episode with EGM):   Details for arrhythmia episode received (all types) Details were received for a AT episode,  which was detected on Mar 10, 2022 6:20:34 AM  Also, note that appears patient's daily burden has decreased since 9/21 alert message.  Will defer to Dr. Curt Bears for further instruction and to see if A Fib clinic may be indicated since patient has been sx with spikes in AF burden.

## 2022-03-15 DIAGNOSIS — L97419 Non-pressure chronic ulcer of right heel and midfoot with unspecified severity: Secondary | ICD-10-CM | POA: Diagnosis not present

## 2022-03-15 DIAGNOSIS — E11621 Type 2 diabetes mellitus with foot ulcer: Secondary | ICD-10-CM | POA: Diagnosis not present

## 2022-03-20 NOTE — Progress Notes (Signed)
Remote pacemaker transmission.   

## 2022-03-21 ENCOUNTER — Other Ambulatory Visit: Payer: Self-pay | Admitting: Family Medicine

## 2022-03-21 ENCOUNTER — Other Ambulatory Visit: Payer: Self-pay | Admitting: Legal Medicine

## 2022-03-22 ENCOUNTER — Telehealth: Payer: Self-pay

## 2022-03-22 DIAGNOSIS — L97419 Non-pressure chronic ulcer of right heel and midfoot with unspecified severity: Secondary | ICD-10-CM | POA: Diagnosis not present

## 2022-03-22 DIAGNOSIS — E11621 Type 2 diabetes mellitus with foot ulcer: Secondary | ICD-10-CM | POA: Diagnosis not present

## 2022-03-22 NOTE — Telephone Encounter (Signed)
Pt called this afternoon wanting to know the results of his a1c. he was notified back on 02/21/2022 of his results but does not recall speaking with anyone.   _____________________________ Pt was notified of the following:  Per Dr. Tobie Poet:  Blood count abnormal. Platelets little low, but fairly stable. Liver function normal.  Kidney function abnormal, but stable.  Cholesterol:good HBA1C: improved from 8 down to 7.1.  Spilling protein in urine still, but improved.  Testosterone levels low. Please ask if patient has stopped his testosterone shots. I would recommend restart. Pend new rx if patient wishes to restart.  Hoyle Sauer, RN 02/21/2022 8:48 AM-Left message to call.    Ginger Organ, [facility] 02/21/22. 2:19-Patient aware. Verbalized Understanding. Patient stated he has been taking the testosterone injection, and will call back if he needs a refill, his wife gives his injections and he will ask her about if he needs a refill. _________________________________________________________________________________ Pt stated that he has not taken his testosterone inj in the last two week but he will start back on it again.

## 2022-03-26 IMAGING — CT CT HEART MORPH/PULM VEIN W/ CM & W/O CA SCORE
2 of 5 series · 11 of 20 positions shown, 13 images · IV contrast (APPLIED)
Comparison: 06/18/2017
COMPARISON: 06/18/2017

Addendum:
EXAM:
OVER-READ INTERPRETATION  CT CHEST

The following report is an over-read performed by radiologist Dr.
Gritzky Zahtila [REDACTED] on 05/14/2020. This
over-read does not include interpretation of cardiac or coronary
anatomy or pathology. The coronary calcium score/coronary CTA
interpretation by the cardiologist is attached.
CLINICAL DATA: 79-year-old male with persistent atrial fibrillation
scheduled for an ablation.
Cardiac CT/CTA
TECHNIQUE: The patient was scanned on a Siemens Somatom scanner.

[Series 6: best syst · axial · 0.43mm/px · z∈[-278,-162]mm · 8 of 373 slices shown, 10 images]
[im 42/373  vessel]
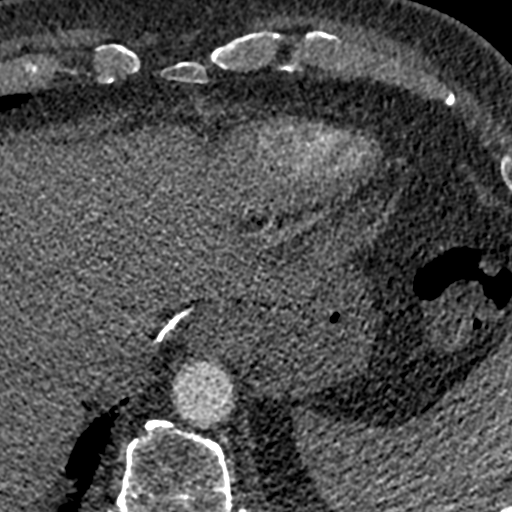
[im 42/373  lung]
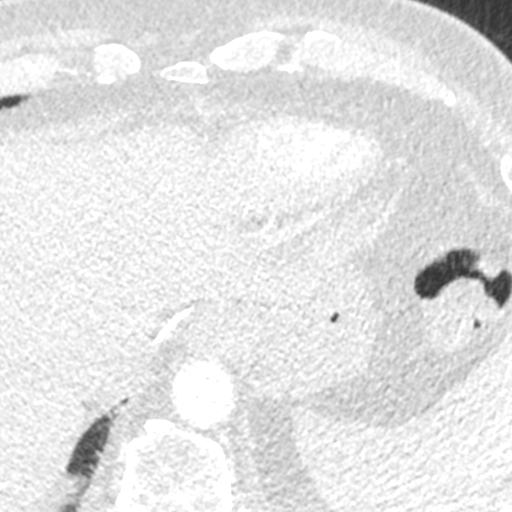
[im 83/373  vessel]
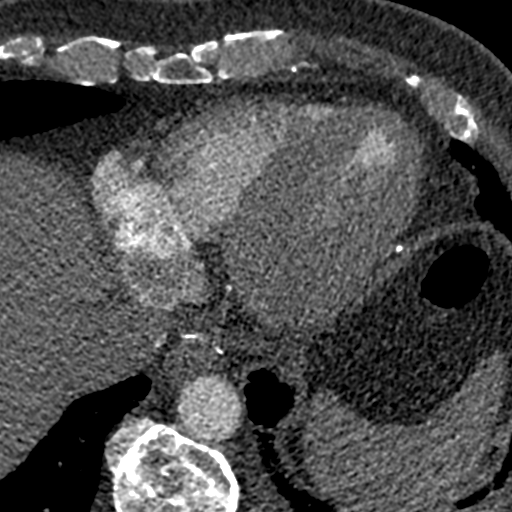
[im 125/373  vessel]
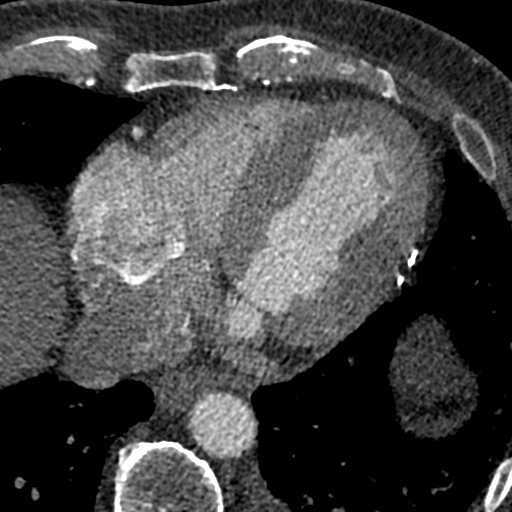
[im 166/373  vessel]
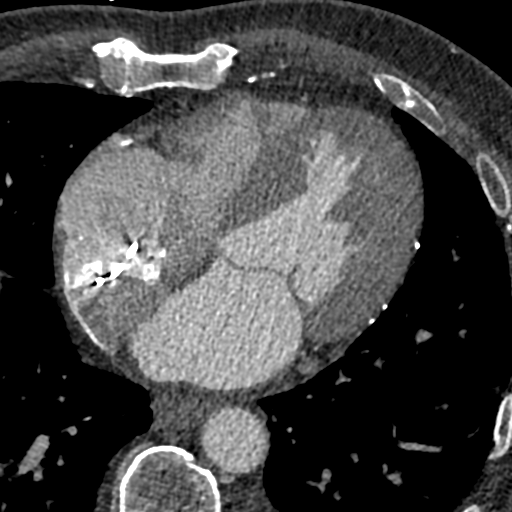
[im 207/373  vessel]
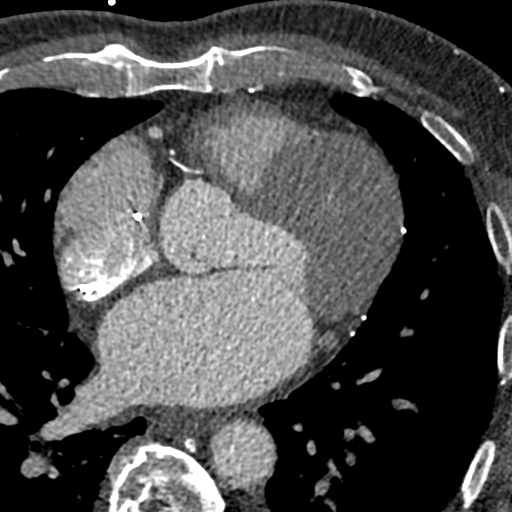
[im 207/373  lung]
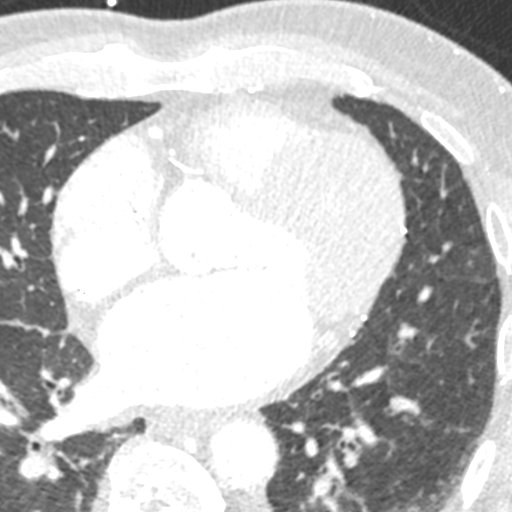
[im 249/373  vessel]
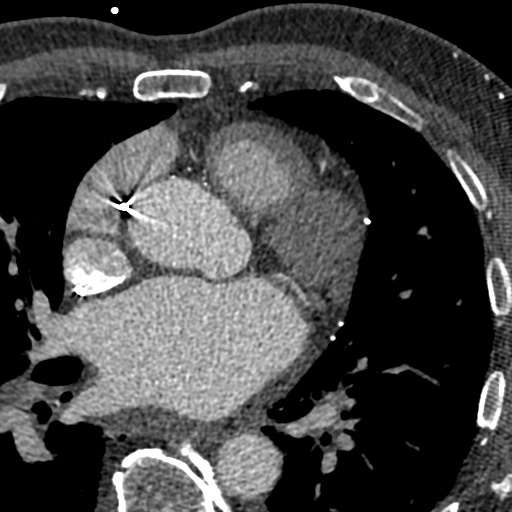
[im 290/373  vessel]
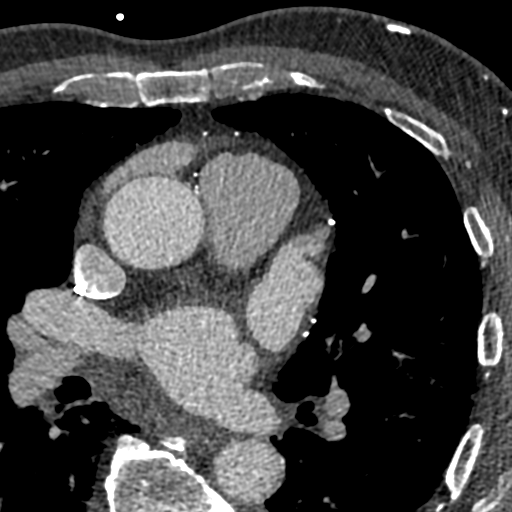
[im 331/373  vessel]
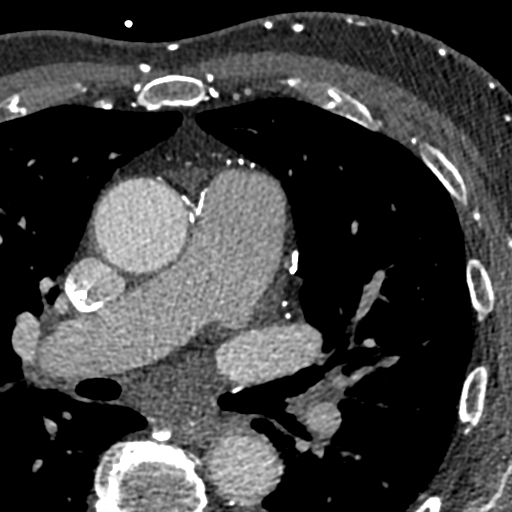

[Series 13: pv delay · axial · portal-venous · 0.43mm/px · z∈[-208,-165]mm · 3 of 175 slices shown]
[im 44/175  vessel]
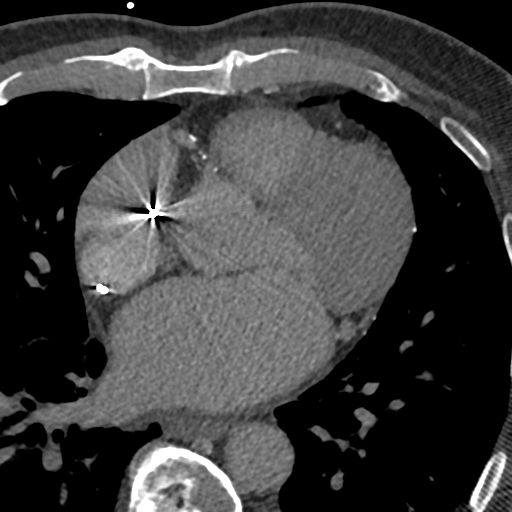
[im 88/175  vessel]
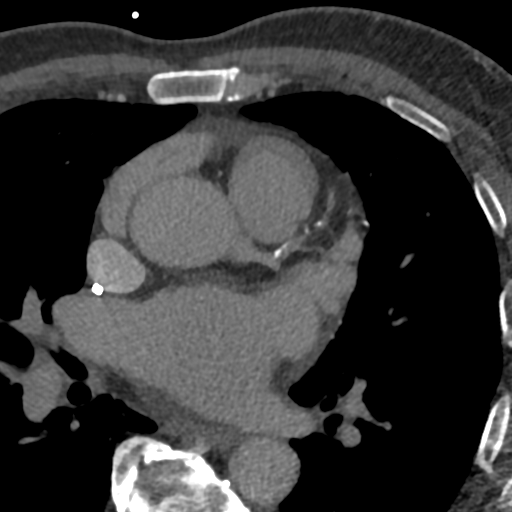
[im 131/175  vessel]
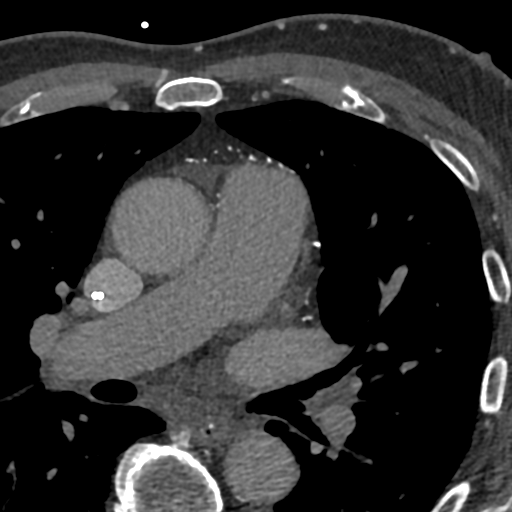

[11 of 20 positions shown; findings below may reference images not displayed]

FINDINGS: Vascular: Mild aneurysmal dilatation of the aortic root at the
sinuses of Valsalva measuring 4.2 cm. Heart borderline in size.

Mediastinum/Nodes: No adenopathy.

Lungs/Pleura: Small bilateral pleural effusions. Minimal bibasilar
atelectasis.

Upper Abdomen: Imaging into the upper abdomen demonstrates no acute
findings.

Musculoskeletal: Chest wall soft tissues are unremarkable. No acute
bony abnormality.
IMPRESSION: 4.2 cm ascending thoracic aortic aneurysm at the aortic root.
Recommend annual imaging followup by CTA or MRA. This recommendation
follows 3474 ACCF/AHA/AATS/ACR/ASA/SCA/LEMIEUX/GOPAL/CHIRA FIDAL/HOJAMUHAMMET Guidelines
for the Diagnosis and Management of Patients with Thoracic Aortic
Disease. Circulation. 3474; 121: E266-e369. Aortic aneurysm NOS
(KEJXL-S77.I)

Small bilateral pleural effusions.
FINDINGS: A 120 kV prospective scan was triggered in the descending thoracic
aorta at 111 HU's. Gantry rotation speed was 280 msecs and
collimation was .9 mm. Carvedilol 12.5 mg PO and Metoprolol 50 mg PO
and no NTG was given. The 3D data set was reconstructed in 5%
intervals of the 60-80 % of the R-R cycle. Diastolic phases were
analyzed on a dedicated work station using MPR, MIP and VRT modes.
The patient received 80 cc of contrast.

There is normal pulmonary vein drainage into the left atrium (2 on
the right and 2 on the left) with ostial measurements as follows:

RUPV: 30.0 x 28.1 mm

RLPV: 23.7 x 23.2 mm

LUPV: 25.3 x 23.4 mm

LLPV: 20.2 x 20.1 mm

The left atrial appendage is large with chicken wing morphology, one
lobe, ostial size 30 x 26 mm and length 63 mm. There is no thrombus
in the left atrial appendage.

The esophagus runs in the left atrial midline and is not in the
proximity to any of the pulmonary veins.

Aorta: Normal caliber. No dissection, moderate diffuse
atherosclerotic plague.

Aortic Valve:  Trileaflet.  Minimal calcifications.

Coronary Arteries: Normal coronary origin. Right dominance. The
study was performed without use of NTG and insufficient for plaque
evaluation.
IMPRESSION: 1. There is normal pulmonary vein drainage into the left atrium.

2. The left atrial appendage is large with chicken wing morphology,
one lobe, ostial size 30 x 26 mm and length 63 mm. There is no
thrombus in the left atrial appendage.

3. The esophagus runs in the left atrial midline and is not in the
proximity to any of the pulmonary veins.

4. Moderately dilated pulmonary artery measuring 36 mm suggestive of
pulmonary hypertension.

*** End of Addendum ***
EXAM:
OVER-READ INTERPRETATION  CT CHEST

The following report is an over-read performed by radiologist Dr.
Gritzky Zahtila [REDACTED] on 05/14/2020. This
over-read does not include interpretation of cardiac or coronary
anatomy or pathology. The coronary calcium score/coronary CTA
interpretation by the cardiologist is attached.
FINDINGS: Vascular: Mild aneurysmal dilatation of the aortic root at the
sinuses of Valsalva measuring 4.2 cm. Heart borderline in size.

Mediastinum/Nodes: No adenopathy.

Lungs/Pleura: Small bilateral pleural effusions. Minimal bibasilar
atelectasis.

Upper Abdomen: Imaging into the upper abdomen demonstrates no acute
findings.

Musculoskeletal: Chest wall soft tissues are unremarkable. No acute
bony abnormality.
IMPRESSION: 4.2 cm ascending thoracic aortic aneurysm at the aortic root.
Recommend annual imaging followup by CTA or MRA. This recommendation
follows 3474 ACCF/AHA/AATS/ACR/ASA/SCA/LEMIEUX/GOPAL/CHIRA FIDAL/HOJAMUHAMMET Guidelines
for the Diagnosis and Management of Patients with Thoracic Aortic
Disease. Circulation. 3474; 121: [id number]. Aortic aneurysm NOS
(KEJXL-S77.I)

Small bilateral pleural effusions.

## 2022-04-03 ENCOUNTER — Ambulatory Visit: Payer: Medicare Other | Attending: Cardiology | Admitting: Cardiology

## 2022-04-03 ENCOUNTER — Encounter: Payer: Self-pay | Admitting: Cardiology

## 2022-04-03 VITALS — BP 120/72 | HR 61 | Ht 75.0 in | Wt 240.0 lb

## 2022-04-03 DIAGNOSIS — D6869 Other thrombophilia: Secondary | ICD-10-CM | POA: Diagnosis not present

## 2022-04-03 DIAGNOSIS — I4819 Other persistent atrial fibrillation: Secondary | ICD-10-CM | POA: Diagnosis not present

## 2022-04-03 LAB — CUP PACEART INCLINIC DEVICE CHECK
Date Time Interrogation Session: 20231016170538
Implantable Lead Implant Date: 20180813
Implantable Lead Implant Date: 20180813
Implantable Lead Location: 753859
Implantable Lead Location: 753860
Implantable Lead Model: 377
Implantable Lead Model: 377
Implantable Lead Serial Number: 49893169
Implantable Lead Serial Number: 50011411
Implantable Pulse Generator Implant Date: 20180813
Pulse Gen Model: 407145
Pulse Gen Serial Number: 69158272

## 2022-04-03 NOTE — Progress Notes (Unsigned)
Electrophysiology Office Note   Date:  04/04/2022   ID:  Paul, Kent 20-May-1941, MRN [id number]  PCP:  [name], MD  Cardiologist:  Primary Electrophysiologist:  Paul Sefcik [name], MD    No chief complaint on file.     History of Present Illness: Paul Kent is a 81 y.o. male who is being seen today for the evaluation of atrial fibrillation at the request of Cox, Kirsten, MD. Presenting today for electrophysiology evaluation.    Has a history significant for persistent atrial fibrillation, hypertension, hyperlipidemia, obstructive sleep apnea, type 2 diabetes, sick sinus syndrome status post Biotronik dual-chamber pacemaker.  He is status post cryoablation in 2019 with RF ablation 05/21/2020.  He is continued to have episodes of atrial fibrillation and atrial flutter.  He has had multiple cardioversions since his most recent ablation.  Today, denies symptoms of palpitations, chest pain, shortness of breath, orthopnea, PND, lower extremity edema, claudication, dizziness, presyncope, syncope, bleeding, or neurologic sequela. The patient is tolerating medications without difficulties.  Since being seen he has done well.  He has gone back into atrial fibrillation.  He has some fatigue and has gained approximately 5 pounds, but otherwise has no major complaints.    Past Medical History:  Diagnosis Date   Acquired bilateral hammer toes    Arthralgia of left temporomandibular joint    Atrial fibrillation ([place]) 05/2016   Bladder cancer ([place])    Bradycardia    LOW HEART RATE   Calculus of ureter    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   Cellulitis of right lower limb    CHF (congestive heart failure) ([place]) 02/2018   Chronic atrial fibrillation (HCC)    Corns and callosities    Deviated septum 07/01/2018   Deviated septum 07/01/2018   Diabetes mellitus due to underlying condition with unspecified complications ([place]) 24/40/1027   Disturbances of salivary secretion     Epistaxis    Essential hypertension    Essential tremor    H pylori ulcer    H pylori ulcer    Herpes zoster with nervous system complication 25/36/6440   Hesitancy of micturition    Hyperlipidemia    Hypothyroidism    Impacted cerumen, bilateral    Jaw pain    Lesion of femoral nerve 04/30/2013   Localized edema    Male erectile disorder    Malignant neoplasm of overlapping sites of bladder ([place])    Nasal congestion 07/01/2018   Nasal turbinate hypertrophy 07/01/2018   Obstructive sleep apnea 01/17/2017   Other constipation    Other fatigue    Overweight    Pacemaker reprogramming/check 02/12/2017   Pain in right finger(s)    Pain in right lower leg    Paroxysmal atrial fibrillation ([place]) 06/08/2016   Peptic ulcer disease    Persistent atrial fibrillation ([place]) 06/01/2017   Primary insomnia    Renal stones    Renal stones    Secondary hypercoagulable state ([place]) 06/17/2020   Shingles 3474   WITH COMPLICATIONS (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)   Shingles 06/2012   Shortness of breath    Sick sinus syndrome ([place])    Sinus bradycardia 03/07/2017   Sleep apnea    dx 08/2015, CPAP   Spontaneous ecchymoses    Status post ablation of atrial fibrillation 09/16/2020   Testicular hypofunction    Thoracic or lumbosacral neuritis or radiculitis 06/03/2013   Trigger middle finger of right hand 11/28/2019   Trigger middle finger  of right hand 11/28/2019   Type 2 diabetes mellitus with circulatory disorder, 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[place]) 01/17/2017   Past Surgical History:  Procedure Laterality Date   ABLATION  06/2017   ATRIAL FIBRILLATION ABLATION N/A 05/21/2020   Procedure: ATRIAL FIBRILLATION ABLATION;  Surgeon: Constance Haw, MD;  Location: [place] [facility];  Service: Cardiovascular;  Laterality: N/A;   CARDIOVERSION N/A 03/22/2018   Procedure: CARDIOVERSION;  Surgeon: Buford Dresser, MD;  Location: [facility]OSCOPY;   Service: Cardiovascular;  Laterality: N/A;   CARDIOVERSION N/A 07/08/2020   Procedure: CARDIOVERSION;  Surgeon: Jerline Pain, MD;  Location: [facility] ENDOSCOPY;  Service: Cardiovascular;  Laterality: N/A;   HEMIARTHROPLASTY HIP  10/31/2013   IT HIP BIPOLAR   LUMBAR SPINE SURGERY     PACEMAKER PLACEMENT  01/29/2017   SHOULDER SURGERY Right      Current Outpatient Medications  Medication Sig Dispense Refill   amiodarone (PACERONE) 200 MG tablet TAKE ONE TABLET BY MOUTH EVERY DAY 90 tablet 1   atorvastatin (LIPITOR) 10 MG tablet TAKE ONE TABLET BY MOUTH EVERY EVENING 90 tablet 0   B-D UF III MINI PEN NEEDLES 31G X 5 MM MISC SMARTSIG:injection Daily     carvedilol (COREG) 12.5 MG tablet Take 1 tablet (12.5 mg total) by mouth 2 (two) times daily. 180 tablet 2   dapagliflozin propanediol (FARXIGA) 10 MG TABS tablet Take 1 tablet (10 mg total) by mouth daily. 90 tablet 0   famotidine (PEPCID) 40 MG tablet Take 1 tablet (40 mg total) by mouth daily. 90 tablet 1   gabapentin (NEURONTIN) 300 MG capsule Take 1 capsule (300 mg total) by mouth 3 (three) times daily. 270 capsule 1   glipiZIDE (GLUCOTROL XL) 10 MG 24 hr tablet TAKE ONE TABLET BY MOUTH TWICE DAILY (Patient taking differently: Take 10 mg by mouth daily.) 180 tablet 1   levothyroxine (SYNTHROID) 150 MCG tablet TAKE ONE TABLET BY MOUTH EVERY MORNING 90 tablet 1   testosterone cypionate (DEPOTESTOSTERONE CYPIONATE) 200 MG/ML injection Inject 1 mL (200 mg total) into the muscle every 14 (fourteen) days. INJECT 1 MILLILITER IN THE MUSCLE EVERY 2 WEEKS 10 mL 0   TOUJEO SOLOSTAR 300 UNIT/ML Solostar Pen Inject 20 Units into the skin daily at 6 (six) AM. 4.5 mL 1   XARELTO 20 MG TABS tablet TAKE ONE TABLET BY MOUTH EVERY EVENING 90 tablet 1   No current facility-administered medications for this visit.    Allergies:   Propranolol, Canagliflozin, Clonidine derivatives, Hydralazine hcl, Metformin and related, Topiramate, Ambien [zolpidem], and Ozempic  (0.25 or 0.5 mg-dose) [semaglutide(0.25 or 0.'5mg'$ -dos)]   Social History:  The patient  reports that he has never smoked. He has never used smokeless tobacco. He reports that he does not currently use alcohol. He reports that he does not use drugs.   Family History:  The patient's family history includes Arthritis in an other family member; Congestive Heart Failure in an other family member; Diabetes type II in an other family member; Heart Problems in his father; Heart attack in his mother; Hyperlipidemia in an other family member; Transient ischemic attack in his mother; Tuberculosis in his brother.   ROS:  Please see the history of present illness.   Otherwise, review of systems is positive for none.   All other systems are reviewed and negative.   PHYSICAL EXAM: VS:  BP 120/72   Pulse 61   Ht '6\' 3"'$  (1.905 m)   Wt 240 lb (108.9 kg)  SpO2 97%   BMI 30.00 kg/m  , BMI Body mass index is 30 kg/m. GEN: Well nourished, well developed, in no acute distress  HEENT: normal  Neck: no JVD, carotid bruits, or masses Cardiac: RRR; no murmurs, rubs, or gallops,no edema  Respiratory:  clear to auscultation bilaterally, normal work of breathing GI: soft, nontender, nondistended, + BS MS: no deformity or atrophy  Skin: warm and dry, device site well healed Neuro:  Strength and sensation are intact Psych: euthymic mood, full affect  EKG:  EKG is not ordered today. Personal review of the ekg ordered 01/23/22 shows AV paced  Personal review of the device interrogation today. Results in [place]: 11/01/2021: TSH 2.170 02/13/2022: ALT 38; BUN 31; Creatinine, Ser 1.62; Hemoglobin 15.8; Platelets 138; Potassium 4.8; Sodium 140    Lipid Panel     Component Value Date/Time   CHOL 113 02/13/2022 1020   TRIG 80 02/13/2022 1020   HDL 51 02/13/2022 1020   CHOLHDL 2.2 02/13/2022 1020   LDLCALC 46 02/13/2022 1020     Wt Readings from Last 3 Encounters:  04/03/22 240 lb (108.9 kg)   02/13/22 233 lb (105.7 kg)  01/23/22 246 lb 3.2 oz (111.7 kg)      Other studies Reviewed: Additional studies/ records that were reviewed today include: TTE 03/01/18  Review of the above records today demonstrates:  - Left ventricle: The cavity size was normal. Wall thickness was   increased in a pattern of mild LVH. Systolic function was mildly   reduced. The estimated ejection fraction was in the range of 45%   to 50%. Diffuse hypokinesis. - Left atrium: The atrium was mildly dilated. Volume/bsa, S: 37.6   ml/m^2. - Right ventricle: The cavity size was moderately dilated. Wall   thickness was normal. Pacer wire or catheter noted in right   ventricle.   ASSESSMENT AND PLAN:  1.  Persistent atrial fibrillation: Currently on Xarelto 20 mg daily, amiodarone 200 mg daily.  Status post cryoablation in 2019 with RF ablation 05/21/2020.  CHA2DS2-VASc of 4.  He has continued to have episodes of atrial fibrillation.  He feels that he may have gained some weight, but otherwise is doing okay.  We Bryton Waight continue with current management.  2.  Hypertension: Currently well controlled  3.  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  Device function appropriately.  No changes.  4.  Obstructive sleep apnea: CPAP compliance encouraged  5.  Second hypercoagulable state: Currently on Xarelto for atrial fibrillation as above.   Current medicines are reviewed at length with the patient today.   The patient does not have concerns regarding his medicines.  The following changes were made today: None  Labs/ tests ordered today include:  Orders Placed This Encounter  Procedures   CUP [place]     Disposition:   FU with Weda Baumgarner 6 months  Signed, Davante Gerke [name], MD  04/04/2022 7:52 AM     [place] [address] [phone] (office) [phone] (fax)

## 2022-04-04 DIAGNOSIS — Z23 Encounter for immunization: Secondary | ICD-10-CM | POA: Diagnosis not present

## 2022-04-05 ENCOUNTER — Other Ambulatory Visit: Payer: Self-pay | Admitting: Family Medicine

## 2022-04-05 DIAGNOSIS — S43003A Unspecified subluxation of unspecified shoulder joint, initial encounter: Secondary | ICD-10-CM | POA: Diagnosis not present

## 2022-04-05 DIAGNOSIS — S46012A Strain of muscle(s) and tendon(s) of the rotator cuff of left shoulder, initial encounter: Secondary | ICD-10-CM | POA: Diagnosis not present

## 2022-04-05 DIAGNOSIS — E08621 Diabetes mellitus due to underlying condition with foot ulcer: Secondary | ICD-10-CM

## 2022-04-13 DIAGNOSIS — H04129 Dry eye syndrome of unspecified lacrimal gland: Secondary | ICD-10-CM | POA: Diagnosis not present

## 2022-04-13 LAB — HM DIABETES EYE EXAM

## 2022-04-21 DIAGNOSIS — I5033 Acute on chronic diastolic (congestive) heart failure: Secondary | ICD-10-CM | POA: Diagnosis not present

## 2022-04-21 DIAGNOSIS — R0602 Shortness of breath: Secondary | ICD-10-CM | POA: Diagnosis not present

## 2022-04-21 DIAGNOSIS — Z87442 Personal history of urinary calculi: Secondary | ICD-10-CM | POA: Diagnosis not present

## 2022-04-21 DIAGNOSIS — I4891 Unspecified atrial fibrillation: Secondary | ICD-10-CM | POA: Diagnosis not present

## 2022-04-21 DIAGNOSIS — K5730 Diverticulosis of large intestine without perforation or abscess without bleeding: Secondary | ICD-10-CM | POA: Diagnosis not present

## 2022-04-21 DIAGNOSIS — E1122 Type 2 diabetes mellitus with diabetic chronic kidney disease: Secondary | ICD-10-CM | POA: Diagnosis not present

## 2022-04-21 DIAGNOSIS — I517 Cardiomegaly: Secondary | ICD-10-CM | POA: Diagnosis not present

## 2022-04-21 DIAGNOSIS — I21A1 Myocardial infarction type 2: Secondary | ICD-10-CM | POA: Diagnosis not present

## 2022-04-21 DIAGNOSIS — R079 Chest pain, unspecified: Secondary | ICD-10-CM | POA: Diagnosis not present

## 2022-04-21 DIAGNOSIS — R059 Cough, unspecified: Secondary | ICD-10-CM | POA: Diagnosis not present

## 2022-04-21 DIAGNOSIS — N183 Chronic kidney disease, stage 3 unspecified: Secondary | ICD-10-CM | POA: Diagnosis not present

## 2022-04-21 DIAGNOSIS — Z1152 Encounter for screening for COVID-19: Secondary | ICD-10-CM | POA: Diagnosis not present

## 2022-04-21 DIAGNOSIS — E1169 Type 2 diabetes mellitus with other specified complication: Secondary | ICD-10-CM | POA: Diagnosis not present

## 2022-04-21 DIAGNOSIS — K8020 Calculus of gallbladder without cholecystitis without obstruction: Secondary | ICD-10-CM | POA: Diagnosis not present

## 2022-04-21 DIAGNOSIS — I509 Heart failure, unspecified: Secondary | ICD-10-CM | POA: Diagnosis not present

## 2022-04-21 DIAGNOSIS — I130 Hypertensive heart and chronic kidney disease with heart failure and stage 1 through stage 4 chronic kidney disease, or unspecified chronic kidney disease: Secondary | ICD-10-CM | POA: Diagnosis not present

## 2022-04-21 DIAGNOSIS — Z950 Presence of cardiac pacemaker: Secondary | ICD-10-CM | POA: Diagnosis not present

## 2022-04-21 DIAGNOSIS — Z7984 Long term (current) use of oral hypoglycemic drugs: Secondary | ICD-10-CM | POA: Diagnosis not present

## 2022-04-21 DIAGNOSIS — Z7901 Long term (current) use of anticoagulants: Secondary | ICD-10-CM | POA: Diagnosis not present

## 2022-04-21 DIAGNOSIS — R0902 Hypoxemia: Secondary | ICD-10-CM | POA: Diagnosis not present

## 2022-04-21 DIAGNOSIS — Z79899 Other long term (current) drug therapy: Secondary | ICD-10-CM | POA: Diagnosis not present

## 2022-04-21 DIAGNOSIS — E0580 Other thyrotoxicosis without thyrotoxic crisis or storm: Secondary | ICD-10-CM | POA: Diagnosis not present

## 2022-04-21 DIAGNOSIS — Z91148 Patient's other noncompliance with medication regimen for other reason: Secondary | ICD-10-CM | POA: Diagnosis not present

## 2022-04-22 ENCOUNTER — Other Ambulatory Visit: Payer: Self-pay | Admitting: Family Medicine

## 2022-04-22 DIAGNOSIS — I517 Cardiomegaly: Secondary | ICD-10-CM

## 2022-04-24 ENCOUNTER — Telehealth: Payer: Self-pay

## 2022-04-24 NOTE — Telephone Encounter (Signed)
Transition Care Management Follow-up Telephone Call Date of discharge and from where: Discharged 04/23/22 from [facility] Admitted 04/21/22 after presenting to ED with [facility] while laying down in bed at home.  He has been non-compliant with his Lasix.  Diuresed with IV Lasix, echo now shows EF of 40-45%.   Discharged with Lasix 40 mg daily for one week then down to 20 mg daily and further dosing is to be made by cardiology. TSH 0.37, Free T4 2.64, Free T3 3.44   250 mcg of Synthroid changed to 100 mcg How have you been since you were released from the hospital? Doing well Any questions or concerns? No  Items Reviewed: Did the pt receive and understand the discharge instructions provided? Yes  Medications obtained and verified? Yes  Any new allergies since your discharge? No  Dietary orders reviewed? Yes Do you have support at home? Yes   Home Care and Equipment/Supplies: Were home health services ordered? no If so, what is the name of the agency? N/A  Has the agency set up a time to come to the patient's home? not applicable Were any new equipment or medical supplies ordered?  No What is the name of the medical supply agency? N/A Were you able to get the supplies/equipment? not applicable Do you have any questions related to the use of the equipment or supplies? No  Functional Questionnaire: (I = Independent and D = Dependent) ADLs: I  Bathing/Dressing- I  Meal Prep- I  Eating- I  Maintaining continence- I  Transferring/Ambulation- I  Managing Meds- I  Follow up appointments reviewed:  PCP Hospital f/u appt confirmed? Yes  Scheduled to see Dr Tobie Poet on 05/01/22 @ 11.  [facility] f/u appt confirmed? No  Patient will schedule with cardiology Are transportation arrangements needed? No  If their condition worsens, is the pt aware to call PCP or go to the Emergency Dept.? Yes Was the patient provided with contact information for the PCP's office or ED? Yes Was to pt  encouraged to call back with questions or concerns? Yes

## 2022-05-01 ENCOUNTER — Ambulatory Visit (INDEPENDENT_AMBULATORY_CARE_PROVIDER_SITE_OTHER): Payer: Medicare Other | Admitting: Family Medicine

## 2022-05-01 ENCOUNTER — Encounter: Payer: Self-pay | Admitting: Family Medicine

## 2022-05-01 VITALS — BP 120/74 | HR 65 | Temp 97.1°F | Ht 75.0 in | Wt 237.0 lb

## 2022-05-01 DIAGNOSIS — Z950 Presence of cardiac pacemaker: Secondary | ICD-10-CM

## 2022-05-01 DIAGNOSIS — R0602 Shortness of breath: Secondary | ICD-10-CM | POA: Diagnosis not present

## 2022-05-01 DIAGNOSIS — G4733 Obstructive sleep apnea (adult) (pediatric): Secondary | ICD-10-CM | POA: Diagnosis not present

## 2022-05-01 DIAGNOSIS — E039 Hypothyroidism, unspecified: Secondary | ICD-10-CM | POA: Diagnosis not present

## 2022-05-01 DIAGNOSIS — J9611 Chronic respiratory failure with hypoxia: Secondary | ICD-10-CM

## 2022-05-01 DIAGNOSIS — I509 Heart failure, unspecified: Secondary | ICD-10-CM | POA: Diagnosis not present

## 2022-05-01 NOTE — Assessment & Plan Note (Signed)
Continue cpap with 2 L oxygen.

## 2022-05-01 NOTE — Assessment & Plan Note (Signed)
Recheck tsh in 4 weeks.  Continue synthroid 100 mcg once daily in am.

## 2022-05-01 NOTE — Patient Instructions (Addendum)
Discuss entresto with Dr. Agustin Cree.   Continue lasix 40 mg daily.  You should have no more than 1.5 to 2 litres of fluid in a day.

## 2022-05-01 NOTE — Assessment & Plan Note (Addendum)
Improved. Continue lasix 40 mg daily.  Consider addition of entresto. Discuss with Dr. Agustin Cree. You should have no more than 1.5 to 2 litres of fluid in a day.

## 2022-05-01 NOTE — Progress Notes (Signed)
Subjective:  Patient ID: Paul Kent, male    DOB: 05/04/1941  Age: 81 y.o. MRN: [id number]  Chief Complaint  Patient presents with   Transitions Of Care   Congestive Heart Failure   Shortness of Breath   Hypothyroidism    HPI Patient was seen at [facility]. He was admitted on 04/21/2022 to 04/23/2022. He was discharged stable to home. He was admitted with acute on chronic diastolic heart failure exacerbation secondary to noncompliance with diuretics. He had a gray zone troponin consistent with NSTEMI type 2, no evidence of coronary ischemia, He uses 2L at night.  They discontinued Levothyroxine 150 mcg daily and started Levothyroxine 100 mcg daily. Restarted Furosemide 40 mg daily and continue with same medication at home.  They did CT angiogram of the chest and showed NO evidence of PE, small bilateral pleural effusions and findings suspicious for mild interstitial edema/congestive heart failure, Cholelithiasis without any evidence of choleystitis. No symptoms of biliary colic.  They did CT Abdomen and pelvis w contrast and showed No acute findings within the abdomen or pelvis. Stable mildly enlarged prostate.  Current Outpatient Medications on File Prior to Visit  Medication Sig Dispense Refill   amiodarone (PACERONE) 200 MG tablet TAKE ONE TABLET BY MOUTH EVERY DAY 90 tablet 1   atorvastatin (LIPITOR) 10 MG tablet TAKE ONE TABLET BY MOUTH EVERY EVENING 90 tablet 0   B-D UF III MINI PEN NEEDLES 31G X 5 MM MISC SMARTSIG:injection Daily     carvedilol (COREG) 12.5 MG tablet Take 1 tablet (12.5 mg total) by mouth 2 (two) times daily. 180 tablet 2   famotidine (PEPCID) 40 MG tablet Take 1 tablet (40 mg total) by mouth daily. 90 tablet 1   FARXIGA 10 MG TABS tablet TAKE ONE TABLET BY MOUTH EVERY DAY 90 tablet 0   furosemide (LASIX) 40 MG tablet Take 40 mg by mouth daily.     gabapentin (NEURONTIN) 300 MG capsule Take 1 capsule (300 mg total) by mouth 3 (three) times daily.  270 capsule 1   glipiZIDE (GLUCOTROL XL) 10 MG 24 hr tablet TAKE ONE TABLET BY MOUTH TWICE DAILY (Patient taking differently: Take 10 mg by mouth daily.) 180 tablet 1   levothyroxine (SYNTHROID) 100 MCG tablet Take 100 mcg by mouth daily.     testosterone cypionate (DEPOTESTOSTERONE CYPIONATE) 200 MG/ML injection Inject 1 mL (200 mg total) into the muscle every 14 (fourteen) days. INJECT 1 MILLILITER IN THE MUSCLE EVERY 2 WEEKS 10 mL 0   TOUJEO SOLOSTAR 300 UNIT/ML Solostar Pen Inject 20 Units into the skin daily at 6 (six) AM. 4.5 mL 1   XARELTO 20 MG TABS tablet TAKE ONE TABLET BY MOUTH EVERY EVENING 90 tablet 1   No current facility-administered medications on file prior to visit.   Past Medical History:  Diagnosis Date   Acquired bilateral hammer toes    Arthralgia of left temporomandibular joint    Atrial fibrillation ([place]) 05/2016   Bladder cancer ([place])    Bradycardia    LOW HEART RATE   Calculus of ureter    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   Cellulitis of right lower limb    CHF (congestive heart failure) ([place]) 02/2018   Chronic atrial fibrillation (HCC)    Corns and callosities    Deviated septum 07/01/2018   Deviated septum 07/01/2018   Diabetes mellitus due to underlying condition with unspecified complications ([place]) 36/62/9476   Disturbances of salivary secretion    Epistaxis  Essential hypertension    Essential tremor    H pylori ulcer    H pylori ulcer    Herpes zoster with nervous system complication 76/28/3151   Hesitancy of micturition    Hyperlipidemia    Hypothyroidism    Impacted cerumen, bilateral    Jaw pain    Lesion of femoral nerve 04/30/2013   Localized edema    Male erectile disorder    Malignant neoplasm of overlapping sites of bladder ([place])    Nasal congestion 07/01/2018   Nasal turbinate hypertrophy 07/01/2018   Obstructive sleep apnea 01/17/2017   Other constipation    Other fatigue    Overweight    Pacemaker reprogramming/check  02/12/2017   Pain in right finger(s)    Pain in right lower leg    Paroxysmal atrial fibrillation ([place]) 06/08/2016   Peptic ulcer disease    Persistent atrial fibrillation ([place]) 06/01/2017   Primary insomnia    Renal stones    Renal stones    Secondary hypercoagulable state ([place]) 06/17/2020   Shingles 7616   WITH COMPLICATIONS (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)   Shingles 06/2012   Shortness of breath    Sick sinus syndrome ([place])    Sinus bradycardia 03/07/2017   Sleep apnea    dx 08/2015, CPAP   Spontaneous ecchymoses    Status post ablation of atrial fibrillation 09/16/2020   Testicular hypofunction    Thoracic or lumbosacral neuritis or radiculitis 06/03/2013   Trigger middle finger of right hand 11/28/2019   Trigger middle finger of right hand 11/28/2019   Type 2 diabetes mellitus with circulatory disorder, 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[place]) 01/17/2017   Past Surgical History:  Procedure Laterality Date   ABLATION  06/2017   ATRIAL FIBRILLATION ABLATION N/A 05/21/2020   Procedure: ATRIAL FIBRILLATION ABLATION;  Surgeon: Constance Haw, MD;  Location: [place] [facility] LAB;  Service: Cardiovascular;  Laterality: N/A;   CARDIOVERSION N/A 03/22/2018   Procedure: CARDIOVERSION;  Surgeon: Buford Dresser, MD;  Location: [facility] ENDOSCOPY;  Service: Cardiovascular;  Laterality: N/A;   CARDIOVERSION N/A 07/08/2020   Procedure: CARDIOVERSION;  Surgeon: Jerline Pain, MD;  Location: [facility] ENDOSCOPY;  Service: Cardiovascular;  Laterality: N/A;   HEMIARTHROPLASTY HIP  10/31/2013   IT HIP BIPOLAR   LUMBAR SPINE SURGERY     PACEMAKER PLACEMENT  01/29/2017   SHOULDER SURGERY Right     Family History  Problem Relation Age of Onset   Heart attack Mother    Transient ischemic attack Mother    Heart Problems Father    Tuberculosis Brother    Diabetes type II Other    Hyperlipidemia Other    Congestive Heart Failure Other    Arthritis Other     Social History   Socioeconomic History   Marital status: Married    Spouse name: [name]   Number of children: 3   Years of education: Not on file   Highest education level: Not on file  Occupational History   Occupation: RETIRED    Comment: school principal  Tobacco Use   Smoking status: Never   Smokeless tobacco: Never  Vaping Use   Vaping Use: Never used  Substance and Sexual Activity   Alcohol use: Not Currently   Drug use: Never   Sexual activity: Not on file  Other Topics Concern   Not on file  Social History Narrative   Lives with wife   Social Determinants of Health   Financial Resource Strain:  Low Risk  (11/24/2021)   Overall Financial Resource Strain (CARDIA)    Difficulty of Paying Living Expenses: Not hard at all  Food Insecurity: No Food Insecurity (11/24/2021)   Hunger Vital Sign    Worried About Running Out of Food in the Last Year: Never true    Ran Out of Food in the Last Year: Never true  Transportation Needs: No Transportation Needs (11/24/2021)   PRAPARE - Hydrologist (Medical): No    Lack of Transportation (Non-Medical): No  Physical Activity: Sufficiently Active (11/24/2021)   Exercise Vital Sign    Days of Exercise per Week: 4 days    Minutes of Exercise per Session: 60 min  Stress: No Stress Concern Present (11/24/2021)   [place]    Feeling of Stress : Not at all  Social Connections: [place] (11/24/2021)   Social Connection and Isolation Panel [NHANES]    Frequency of Communication with Friends and Family: More than three times a week    Frequency of Social Gatherings with Friends and Family: More than three times a week    Attends Religious Services: More than 4 times per year    Active Member of [employer] or Organizations: Yes    Attends Music therapist: More than 4 times per year    Marital Status: Married    Review of Systems   Constitutional:  Negative for chills, fatigue, fever and unexpected weight change.  HENT:  Negative for congestion, ear pain, sinus pain and sore throat.   Respiratory:  Negative for cough and shortness of breath.   Cardiovascular:  Negative for chest pain and palpitations.  Gastrointestinal:  Negative for abdominal pain, blood in stool, constipation, diarrhea, nausea and vomiting.  Endocrine: Negative for polydipsia.  Genitourinary:  Negative for dysuria.  Musculoskeletal:  Negative for back pain.  Skin:  Negative for rash.  Neurological:  Negative for headaches.     Objective:  BP 120/74 (BP Location: Left Arm, Patient Position: Sitting, Cuff Size: Large)   Pulse 65   Temp (!) 97.1 F (36.2 C) (Temporal)   Ht '6\' 3"'$  (1.905 m)   Wt 237 lb (107.5 kg)   SpO2 98%   BMI 29.62 kg/m      05/01/2022   10:57 AM 04/03/2022    4:54 PM 02/13/2022    9:03 AM  BP/Weight  Systolic BP 696 295 284  Diastolic BP 74 72 74  Wt. (Lbs) 237 240 233  BMI 29.62 kg/m2 30 kg/m2 31.6 kg/m2    Physical Exam Vitals reviewed.  Constitutional:      Appearance: Normal appearance. He is well-developed.  Neck:     Vascular: No carotid bruit.  Cardiovascular:     Rate and Rhythm: Normal rate. Rhythm irregular.     Heart sounds: Normal heart sounds.  Pulmonary:     Effort: Pulmonary effort is normal.     Breath sounds: Normal breath sounds.  Abdominal:     General: Bowel sounds are normal.     Palpations: Abdomen is soft.     Tenderness: There is no abdominal tenderness.  Musculoskeletal:     Right lower leg: No edema.     Left lower leg: No edema.  Neurological:     Mental Status: He is alert and oriented to person, place, and time.  Psychiatric:        Mood and Affect: Mood normal.  Behavior: Behavior normal.     Diabetic Foot Exam - Simple   No data filed      Lab Results  Component Value Date   WBC 3.9 02/13/2022   HGB 15.8 02/13/2022   HCT 45.9 02/13/2022   PLT 138  (L) 02/13/2022   GLUCOSE 111 (H) 02/13/2022   CHOL 113 02/13/2022   TRIG 80 02/13/2022   HDL 51 02/13/2022   LDLCALC 46 02/13/2022   ALT 38 02/13/2022   AST 37 02/13/2022   NA 140 02/13/2022   K 4.8 02/13/2022   CL 103 02/13/2022   CREATININE 1.62 (H) 02/13/2022   BUN 31 (H) 02/13/2022   CO2 22 02/13/2022   TSH 2.170 11/01/2021   HGBA1C 7.1 (H) 02/13/2022   MICROALBUR 80 01/20/2021      Assessment & Plan:   Problem List Items Addressed This Visit       Cardiovascular and Mediastinum   Acute on chronic heart failure ([place]) - Primary    Improved. Continue lasix 40 mg daily.  Consider addition of entresto. Discuss with Dr. Agustin Cree. You should have no more than 1.5 to 2 litres of fluid in a day.      Relevant Orders   Comprehensive metabolic panel     Respiratory   OSA on CPAP    Continue cpap with 2 L oxygen.      Chronic respiratory failure with hypoxia (HCC)    Continue oxygen 2 L at night.         Endocrine   Hypothyroidism    Recheck tsh in 4 weeks.  Continue synthroid 100 mcg once daily in am.         Other   Cardiac pacemaker in situ   SOB (shortness of breath)    Improved.     .  Orders Placed This Encounter  Procedures   Comprehensive metabolic panel     Follow-up: Return in about 22 days (around 05/23/2022) for chronic fasting.  An After Visit Summary was printed and given to the patient.  [name], MD [facility] [phone]

## 2022-05-01 NOTE — Assessment & Plan Note (Signed)
Continue oxygen 2 L at night.  

## 2022-05-01 NOTE — Assessment & Plan Note (Signed)
Improved

## 2022-05-02 LAB — COMPREHENSIVE METABOLIC PANEL
ALT: 16 IU/L (ref 0–44)
AST: 20 IU/L (ref 0–40)
Albumin/Globulin Ratio: 1.4 (ref 1.2–2.2)
Albumin: 4.1 g/dL (ref 3.7–4.7)
Alkaline Phosphatase: 118 IU/L (ref 44–121)
BUN/Creatinine Ratio: 23 (ref 10–24)
BUN: 40 mg/dL — ABNORMAL HIGH (ref 8–27)
Bilirubin Total: 0.8 mg/dL (ref 0.0–1.2)
CO2: 25 mmol/L (ref 20–29)
Calcium: 9.1 mg/dL (ref 8.6–10.2)
Chloride: 102 mmol/L (ref 96–106)
Creatinine, Ser: 1.77 mg/dL — ABNORMAL HIGH (ref 0.76–1.27)
Globulin, Total: 2.9 g/dL (ref 1.5–4.5)
Glucose: 139 mg/dL — ABNORMAL HIGH (ref 70–99)
Potassium: 5.1 mmol/L (ref 3.5–5.2)
Sodium: 143 mmol/L (ref 134–144)
Total Protein: 7 g/dL (ref 6.0–8.5)
eGFR: 38 mL/min/{1.73_m2} — ABNORMAL LOW (ref >59)

## 2022-05-02 NOTE — Progress Notes (Signed)
Liver function normal.  Kidney function abnormal, but fairly stable.

## 2022-05-04 ENCOUNTER — Other Ambulatory Visit: Payer: Self-pay | Admitting: Family Medicine

## 2022-05-08 ENCOUNTER — Telehealth: Payer: Self-pay

## 2022-05-08 ENCOUNTER — Other Ambulatory Visit: Payer: Self-pay | Admitting: Physician Assistant

## 2022-05-08 DIAGNOSIS — E1142 Type 2 diabetes mellitus with diabetic polyneuropathy: Secondary | ICD-10-CM

## 2022-05-08 MED ORDER — DEXCOM G7 SENSOR MISC: 3 refills | Status: DC

## 2022-05-08 NOTE — Telephone Encounter (Signed)
Patient wanted to know if he could get the dexcom G7 sent to [facility] drug. Please advise

## 2022-05-10 ENCOUNTER — Telehealth: Payer: Self-pay

## 2022-05-10 ENCOUNTER — Encounter: Payer: Self-pay | Admitting: Cardiology

## 2022-05-10 ENCOUNTER — Ambulatory Visit: Payer: Medicare Other | Attending: Cardiology | Admitting: Cardiology

## 2022-05-10 VITALS — BP 100/60 | HR 60 | Ht 75.0 in | Wt 240.0 lb

## 2022-05-10 DIAGNOSIS — E08621 Diabetes mellitus due to underlying condition with foot ulcer: Secondary | ICD-10-CM | POA: Insufficient documentation

## 2022-05-10 DIAGNOSIS — I5042 Chronic combined systolic (congestive) and diastolic (congestive) heart failure: Secondary | ICD-10-CM | POA: Diagnosis not present

## 2022-05-10 DIAGNOSIS — I480 Paroxysmal atrial fibrillation: Secondary | ICD-10-CM | POA: Diagnosis not present

## 2022-05-10 DIAGNOSIS — I130 Hypertensive heart and chronic kidney disease with heart failure and stage 1 through stage 4 chronic kidney disease, or unspecified chronic kidney disease: Secondary | ICD-10-CM | POA: Insufficient documentation

## 2022-05-10 DIAGNOSIS — Z950 Presence of cardiac pacemaker: Secondary | ICD-10-CM | POA: Insufficient documentation

## 2022-05-10 DIAGNOSIS — R079 Chest pain, unspecified: Secondary | ICD-10-CM | POA: Diagnosis not present

## 2022-05-10 DIAGNOSIS — L97411 Non-pressure chronic ulcer of right heel and midfoot limited to breakdown of skin: Secondary | ICD-10-CM | POA: Insufficient documentation

## 2022-05-10 DIAGNOSIS — G4733 Obstructive sleep apnea (adult) (pediatric): Secondary | ICD-10-CM | POA: Diagnosis not present

## 2022-05-10 NOTE — Progress Notes (Signed)
Cardiology Office Note:    Date:  05/10/2022   ID:  Raunel, Dimartino 1941-06-19, MRN [id number]  PCP:  [name], MD  Cardiologist:  Jenne Campus, MD    Referring MD: [name], MD   Chief Complaint  Patient presents with   Hospitalization Follow-up    History of Present Illness:    Paul Kent is a 81 y.o. male    with past medical history significant for paroxysmal/persistent atrial fibrillation, essential hypertension, hyperlipidemia, obstructive sleep apnea, type 2 diabetes, sick sinus syndrome, status post Biotronik pacemaker implantation.  Status post atrial fibrillation cryoablation done first time in January 2019 post ablation he was switched from flecainide to amiodarone he had repeated ablation done on 21 May 2020.   Since that time he still have some recurrences of atrial fibrillation.  That being managed right now with amiodarone.  He is anticoag with Xarelto however I am concerned about the dose of Xarelto which is 20 mg we will recheck kidney function today and adjust dose if needed.  Recently he ended up being in the hospital because of decompensated CHF, he was diuresed quite nicely echocardiogram has been performed and surprisingly showed deterioration of his left ventricle ejection fraction to 40 to 45%.  He is coming today to months for follow-up.  Overall doing well.  He said he had no problem anymore swelling of lower extremities gone goes to gym on the regular basis and trying to exercise some, denies have any palpitations.  Past Medical History:  Diagnosis Date   Acquired bilateral hammer toes    Arthralgia of left temporomandibular joint    Atrial fibrillation ([place]) 05/2016   Bladder cancer ([place])    Bradycardia    LOW HEART RATE   Calculus of ureter    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   Cellulitis of right lower limb    CHF (congestive heart failure) ([place]) 02/2018   Chronic atrial fibrillation (HCC)    Corns and callosities     Deviated septum 07/01/2018   Deviated septum 07/01/2018   Diabetes mellitus due to underlying condition with unspecified complications ([place]) 16/38/4665   Disturbances of salivary secretion    Epistaxis    Essential hypertension    Essential tremor    H pylori ulcer    H pylori ulcer    Herpes zoster with nervous system complication 99/35/7017   Hesitancy of micturition    Hyperlipidemia    Hypothyroidism    Impacted cerumen, bilateral    Jaw pain    Lesion of femoral nerve 04/30/2013   Localized edema    Male erectile disorder    Malignant neoplasm of overlapping sites of bladder ([place])    Nasal congestion 07/01/2018   Nasal turbinate hypertrophy 07/01/2018   Obstructive sleep apnea 01/17/2017   Other constipation    Other fatigue    Overweight    Pacemaker reprogramming/check 02/12/2017   Pain in right finger(s)    Pain in right lower leg    Paroxysmal atrial fibrillation ([place]) 06/08/2016   Peptic ulcer disease    Persistent atrial fibrillation ([place]) 06/01/2017   Primary insomnia    Renal stones    Renal stones    Secondary hypercoagulable state ([place]) 06/17/2020   Shingles 7939   WITH COMPLICATIONS (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)   Shingles 06/2012   Shortness of breath    Sick sinus syndrome (HCC)    Sinus bradycardia 03/07/2017   Sleep apnea  dx 08/2015, CPAP   Spontaneous ecchymoses    Status post ablation of atrial fibrillation 09/16/2020   Testicular hypofunction    Thoracic or lumbosacral neuritis or radiculitis 06/03/2013   Trigger middle finger of right hand 11/28/2019   Trigger middle finger of right hand 11/28/2019   Type 2 diabetes mellitus with circulatory disorder, 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[place]) 01/17/2017    Past Surgical History:  Procedure Laterality Date   ABLATION  06/2017   ATRIAL FIBRILLATION ABLATION N/A 05/21/2020   Procedure: ATRIAL FIBRILLATION ABLATION;  Surgeon: Constance Haw, MD;   Location: [place] [facility];  Service: Cardiovascular;  Laterality: N/A;   CARDIOVERSION N/A 03/22/2018   Procedure: CARDIOVERSION;  Surgeon: Buford Dresser, MD;  Location: [facility] ENDOSCOPY;  Service: Cardiovascular;  Laterality: N/A;   CARDIOVERSION N/A 07/08/2020   Procedure: CARDIOVERSION;  Surgeon: Jerline Pain, MD;  Location: [place] ENDOSCOPY;  Service: Cardiovascular;  Laterality: N/A;   HEMIARTHROPLASTY HIP  10/31/2013   IT HIP BIPOLAR   LUMBAR SPINE SURGERY     PACEMAKER PLACEMENT  01/29/2017   SHOULDER SURGERY Right     Current Medications: Current Meds  Medication Sig   amiodarone (PACERONE) 200 MG tablet TAKE ONE TABLET BY MOUTH EVERY DAY   atorvastatin (LIPITOR) 10 MG tablet TAKE ONE TABLET BY MOUTH EVERY EVENING   B-D UF III MINI PEN NEEDLES 31G X 5 MM MISC SMARTSIG:injection Daily   carvedilol (COREG) 12.5 MG tablet Take 1 tablet (12.5 mg total) by mouth 2 (two) times daily.   Continuous Blood Gluc Sensor (DEXCOM G7 SENSOR) MISC To use as directed to monitor glucose   famotidine (PEPCID) 40 MG tablet Take 1 tablet (40 mg total) by mouth daily.   FARXIGA 10 MG TABS tablet TAKE ONE TABLET BY MOUTH EVERY DAY   furosemide (LASIX) 40 MG tablet Take 40 mg by mouth daily.   gabapentin (NEURONTIN) 300 MG capsule Take 1 capsule (300 mg total) by mouth 3 (three) times daily.   glipiZIDE (GLUCOTROL XL) 10 MG 24 hr tablet TAKE ONE TABLET BY MOUTH TWICE DAILY (Patient taking differently: Take 10 mg by mouth daily.)   levothyroxine (SYNTHROID) 100 MCG tablet TAKE ONE TABLET BY MOUTH DAILY   testosterone cypionate (DEPOTESTOSTERONE CYPIONATE) 200 MG/ML injection Inject 1 mL (200 mg total) into the muscle every 14 (fourteen) days. INJECT 1 MILLILITER IN THE MUSCLE EVERY 2 WEEKS   TOUJEO SOLOSTAR 300 UNIT/ML Solostar Pen Inject 20 Units into the skin daily at 6 (six) AM.   XARELTO 20 MG TABS tablet TAKE ONE TABLET BY MOUTH EVERY EVENING     Allergies:   Propranolol, Canagliflozin,  Clonidine derivatives, Hydralazine hcl, Metformin and related, Topiramate, Ambien [zolpidem], and Ozempic (0.25 or 0.5 mg-dose) [semaglutide(0.25 or 0.'5mg'$ -dos)]   Social History   Socioeconomic History   Marital status: Married    Spouse name: [name]   Number of children: 3   Years of education: Not on file   Highest education level: Not on file  Occupational History   Occupation: RETIRED    Comment: school principal  Tobacco Use   Smoking status: Never   Smokeless tobacco: Never  Vaping Use   Vaping Use: Never used  Substance and Sexual Activity   Alcohol use: Not Currently   Drug use: Never   Sexual activity: Not on file  Other Topics Concern   Not on file  Social History Narrative   Lives with wife   Social Determinants of Health  Financial Resource Strain: Low Risk  (11/24/2021)   Overall Financial Resource Strain (CARDIA)    Difficulty of Paying Living Expenses: Not hard at all  Food Insecurity: No Food Insecurity (11/24/2021)   Hunger Vital Sign    Worried About Running Out of Food in the Last Year: Never true    Ran Out of Food in the Last Year: Never true  Transportation Needs: No Transportation Needs (11/24/2021)   PRAPARE - Hydrologist (Medical): No    Lack of Transportation (Non-Medical): No  Physical Activity: Sufficiently Active (11/24/2021)   Exercise Vital Sign    Days of Exercise per Week: 4 days    Minutes of Exercise per Session: 60 min  Stress: No Stress Concern Present (11/24/2021)   [place]    Feeling of Stress : Not at all  Social Connections: [place] (11/24/2021)   Social Connection and Isolation Panel [NHANES]    Frequency of Communication with Friends and Family: More than three times a week    Frequency of Social Gatherings with Friends and Family: More than three times a week    Attends Religious Services: More than 4 times per year    Active  Member of [employer] or Organizations: Yes    Attends Music therapist: More than 4 times per year    Marital Status: Married     Family History: The patient's family history includes Arthritis in an other family member; Congestive Heart Failure in an other family member; Diabetes type II in an other family member; Heart Problems in his father; Heart attack in his mother; Hyperlipidemia in an other family member; Transient ischemic attack in his mother; Tuberculosis in his brother. ROS:   Please see the history of present illness.    All 14 point review of systems negative except as described per history of present illness  EKGs/Labs/Other Studies Reviewed:      Recent Labs: 11/01/2021: TSH 2.170 02/13/2022: Hemoglobin 15.8; Platelets 138 05/01/2022: ALT 16; BUN 40; Creatinine, Ser 1.77; Potassium 5.1; Sodium 143  Recent Lipid Panel    Component Value Date/Time   CHOL 113 02/13/2022 1020   TRIG 80 02/13/2022 1020   HDL 51 02/13/2022 1020   CHOLHDL 2.2 02/13/2022 1020   LDLCALC 46 02/13/2022 1020    Physical Exam:    VS:  BP 100/60 (BP Location: Right Arm, Patient Position: Sitting, Cuff Size: Normal)   Pulse 60   Ht '6\' 3"'$  (1.905 m)   Wt 240 lb (108.9 kg)   SpO2 95%   BMI 30.00 kg/m     Wt Readings from Last 3 Encounters:  05/10/22 240 lb (108.9 kg)  05/01/22 237 lb (107.5 kg)  04/03/22 240 lb (108.9 kg)     GEN:  Well nourished, well developed in no acute distress HEENT: Normal NECK: No JVD; No carotid bruits LYMPHATICS: No lymphadenopathy CARDIAC: RRR, no murmurs, no rubs, no gallops RESPIRATORY:  Clear to auscultation without rales, wheezing or rhonchi  ABDOMEN: Soft, non-tender, non-distended MUSCULOSKELETAL:  No edema; No deformity  SKIN: Warm and dry LOWER EXTREMITIES: no swelling NEUROLOGIC:  Alert and oriented x 3 PSYCHIATRIC:  Normal affect   ASSESSMENT:    1. Chronic combined systolic and diastolic congestive heart failure (HCC)   2.  Paroxysmal atrial fibrillation ([place])   3. Hypertensive heart and renal disease, stage 1-4 or unspecified chronic kidney disease, with heart failure ([place])   4. OSA on  CPAP   5. 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 Cardiac pacemaker in situ    PLAN:    In order of problems listed above:  Chronic systolic and diastolic congestive heart failure.  Deterioration of his left ventricle ejection fraction is new etiology of this phenomenon is unclear for me at the moment.  He did have minimal spell of troponin.  I will schedule him to have a stress test make sure were not missing ischemic heart disease, it could be related to pacemaker, if ischemia is not the etiology of this will consider appropriate medical therapy and potentially upgrading the device. Paroxysmal atrial fibrillation: Maintaining sinus rhythm continue present management which include Xarelto which probably have to be lowered the dose since GFR is low I will recheck Chem-7 today, amiodarone is being used as well. Hypertension: Uncontrolled in the office but he tells me he checked every single day he got a log book and it is always good this morning was 122/70.  Therefore, we will continue present management.  He probably have some component of whitecoat hypertension. Diabetes: To be managed by internal medicine team. Pacemaker present normal function, last interrogation done on 04/03/2022 with estimated ERI time 3 years 7 months   Medication Adjustments/Labs and Tests Ordered: Current medicines are reviewed at length with the patient today.  Concerns regarding medicines are outlined above.  No orders of the defined types were placed in this encounter.  Medication changes: No orders of the defined types were placed in this encounter.   Signed, Park Liter, MD, [facility] 05/10/2022 3:11 PM    [place]

## 2022-05-10 NOTE — Patient Instructions (Signed)
Medication Instructions:  Your physician recommends that you continue on your current medications as directed. Please refer to the Current Medication list given to you today.  *If you need a refill on your cardiac medications before your next appointment, please call your pharmacy*   Lab Work: None Ordered If you have labs (blood work) drawn today and your tests are completely normal, you will receive your results only by: MyChart Message (if you have MyChart) OR A paper copy in the mail If you have any lab test that is abnormal or we need to change your treatment, we will call you to review the results.   Testing/Procedures: Your physician has requested that you have a lexiscan myoview. For further information please visit [email or web address]. Please follow instruction sheet, as given.  The test will take approximately 3 to 4 hours to complete; you may bring reading material.  If someone comes with you to your appointment, they will need to remain in the main lobby due to limited space in the testing area.   How to prepare for your Myocardial Perfusion Test: Do not eat or drink 3 hours prior to your test, except you may have water. Do not consume products containing caffeine (regular or decaffeinated) 12 hours prior to your test. (ex: coffee, chocolate, sodas, tea). Do bring a list of your current medications with you.  If not listed below, you may take your medications as normal. Do wear comfortable clothes (no dresses or overalls) and walking shoes, tennis shoes preferred (No heels or open toe shoes are allowed). Do NOT wear cologne, perfume, aftershave, or lotions (deodorant is allowed). If these instructions are not followed, your test will have to be rescheduled.     Follow-Up: At [facility], you and your health needs are our priority.  As part of our continuing mission to provide you with exceptional heart care, we have created designated Provider Care Teams.  These Care Teams  include your primary Cardiologist (physician) and Advanced Practice Providers (APPs -  Physician Assistants and Nurse Practitioners) who all work together to provide you with the care you need, when you need it.  We recommend signing up for the patient portal called "MyChart".  Sign up information is provided on this After Visit Summary.  MyChart is used to connect with patients for Virtual Visits (Telemedicine).  Patients are able to view lab/test results, encounter notes, upcoming appointments, etc.  Non-urgent messages can be sent to your provider as well.   To learn more about what you can do with MyChart, go to [email or web address].    Your next appointment:   6 month(s)  The format for your next appointment:   In Person  Provider:   Robert Krasowski, MD    Other Instructions NA  

## 2022-05-10 NOTE — Telephone Encounter (Signed)
Patient made aware insurance denied request for dexcom 7.

## 2022-05-11 LAB — BASIC METABOLIC PANEL
BUN/Creatinine Ratio: 23 (ref 10–24)
BUN: 45 mg/dL — ABNORMAL HIGH (ref 8–27)
CO2: 23 mmol/L (ref 20–29)
Calcium: 8.5 mg/dL — ABNORMAL LOW (ref 8.6–10.2)
Chloride: 102 mmol/L (ref 96–106)
Creatinine, Ser: 2 mg/dL — ABNORMAL HIGH (ref 0.76–1.27)
Glucose: 254 mg/dL — ABNORMAL HIGH (ref 70–99)
Potassium: 5.2 mmol/L (ref 3.5–5.2)
Sodium: 139 mmol/L (ref 134–144)
eGFR: 33 mL/min/{1.73_m2} — ABNORMAL LOW (ref >59)

## 2022-05-11 LAB — PRO B NATRIURETIC PEPTIDE: NT-Pro BNP: 1268 pg/mL — ABNORMAL HIGH (ref 0–486)

## 2022-05-15 ENCOUNTER — Telehealth (HOSPITAL_COMMUNITY): Payer: Self-pay | Admitting: *Deleted

## 2022-05-15 NOTE — Telephone Encounter (Signed)
Left message on voicemail per DPR in reference to upcoming appointment scheduled on 05/16/22  with detailed instructions given per Myocardial Perfusion Study Information Sheet for the test. LM to arrive 15 minutes early, and that it is imperative to arrive on time for appointment to keep from having the test rescheduled. If you need to cancel or reschedule your appointment, please call the office within 24 hours of your appointment. Failure to do so may result in a cancellation of your appointment, and a $50 no show fee. Phone number given for call back for any questions. Kirstie Peri

## 2022-05-16 ENCOUNTER — Ambulatory Visit: Payer: Medicare Other | Attending: Cardiology

## 2022-05-16 DIAGNOSIS — I480 Paroxysmal atrial fibrillation: Secondary | ICD-10-CM | POA: Insufficient documentation

## 2022-05-16 DIAGNOSIS — Z950 Presence of cardiac pacemaker: Secondary | ICD-10-CM | POA: Diagnosis not present

## 2022-05-16 DIAGNOSIS — R079 Chest pain, unspecified: Secondary | ICD-10-CM | POA: Insufficient documentation

## 2022-05-16 DIAGNOSIS — I5042 Chronic combined systolic (congestive) and diastolic (congestive) heart failure: Secondary | ICD-10-CM | POA: Insufficient documentation

## 2022-05-16 DIAGNOSIS — I130 Hypertensive heart and chronic kidney disease with heart failure and stage 1 through stage 4 chronic kidney disease, or unspecified chronic kidney disease: Secondary | ICD-10-CM | POA: Insufficient documentation

## 2022-05-16 LAB — MYOCARDIAL PERFUSION IMAGING
LV dias vol: 164 mL (ref 62–150)
LV sys vol: 91 mL
Nuc Stress EF: 44 %
Peak HR: 60 {beats}/min
Rest HR: 60 {beats}/min
Rest Nuclear Isotope Dose: 10.5 mCi
SDS: 0
SRS: 2
SSS: 2
ST Depression (mm): 0 mm
Stress Nuclear Isotope Dose: 28.5 mCi
TID: 1.09

## 2022-05-16 MED ORDER — TECHNETIUM TC 99M TETROFOSMIN IV KIT
28.5000 | PACK | Freq: Once | INTRAVENOUS | Status: AC | PRN
  Administered 2022-05-16: 28.5 via INTRAVENOUS

## 2022-05-16 MED ORDER — REGADENOSON 0.4 MG/5ML IV SOLN
0.4000 mg | Freq: Once | INTRAVENOUS | Status: AC
  Administered 2022-05-16: 0.4 mg via INTRAVENOUS

## 2022-05-16 MED ORDER — TECHNETIUM TC 99M TETROFOSMIN IV KIT
10.5000 | PACK | Freq: Once | INTRAVENOUS | Status: AC | PRN
  Administered 2022-05-16: 10.5 via INTRAVENOUS

## 2022-05-18 ENCOUNTER — Other Ambulatory Visit: Payer: Self-pay | Admitting: Cardiology

## 2022-05-22 ENCOUNTER — Telehealth: Payer: Self-pay

## 2022-05-22 DIAGNOSIS — R7989 Other specified abnormal findings of blood chemistry: Secondary | ICD-10-CM

## 2022-05-22 NOTE — Telephone Encounter (Signed)
Results reviewed with pt as per Dr. Krasowski's note.  Pt verbalized understanding and had no additional questions. Routed to PCP  

## 2022-05-22 NOTE — Telephone Encounter (Signed)
Spoke with pt about lab results. Advised per Dr. Agustin Cree to come in for [facility] to recheck kidney function.  Pt agreed and stated that he would come for lab results.

## 2022-05-23 ENCOUNTER — Ambulatory Visit: Payer: Medicare Other | Admitting: Family Medicine

## 2022-05-30 ENCOUNTER — Other Ambulatory Visit: Payer: Self-pay | Admitting: Family Medicine

## 2022-05-30 ENCOUNTER — Ambulatory Visit (INDEPENDENT_AMBULATORY_CARE_PROVIDER_SITE_OTHER): Payer: Medicare Other | Admitting: Family Medicine

## 2022-05-30 ENCOUNTER — Encounter: Payer: Self-pay | Admitting: Family Medicine

## 2022-05-30 VITALS — BP 120/60 | HR 67 | Temp 97.2°F | Resp 16 | Ht 75.0 in | Wt 237.0 lb

## 2022-05-30 DIAGNOSIS — H02006 Unspecified entropion of left eye, unspecified eyelid: Secondary | ICD-10-CM | POA: Diagnosis not present

## 2022-05-30 DIAGNOSIS — H02003 Unspecified entropion of right eye, unspecified eyelid: Secondary | ICD-10-CM

## 2022-05-30 DIAGNOSIS — H02403 Unspecified ptosis of bilateral eyelids: Secondary | ICD-10-CM

## 2022-05-30 DIAGNOSIS — K219 Gastro-esophageal reflux disease without esophagitis: Secondary | ICD-10-CM

## 2022-05-30 NOTE — Assessment & Plan Note (Addendum)
Refer back to [facility] for possible blepheroplasty BL.  Continue to use gentle shampoo (baby shampoo no tears.) I requested records from recent eye exams.

## 2022-05-30 NOTE — Progress Notes (Signed)
Acute Office Visit  Subjective:    Patient ID: Paul Kent, male    DOB: Aug 29, 1940, 81 y.o.   MRN: [id number]  Chief Complaint  Patient presents with   Eye Problem    HPI: Patient is in today for eye problems.  Patient states that he is having trouble focusing in the right eye. This has been ongoing for 6 months. He saw eye doctor last month and gave him eye drops. Prednisilone eye drops and systane gel drops. He was also using an eye cleanser nightly. Patient says this has not helped at all. Patient finds himself having to blink his eyes numerous times particularly when watching TV. It is very frustrating.   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   Cellulitis of right lower limb    CHF (congestive heart failure) ([place]) 02/2018   Chronic atrial fibrillation ([facility])    Corns and callosities    Deviated septum 07/01/2018   Deviated septum 07/01/2018   Diabetes mellitus due to underlying condition with unspecified complications ([place]) 37/09/8887   Disturbances of salivary secretion    Epistaxis    Essential hypertension    Essential tremor    H pylori ulcer    H pylori ulcer    Herpes zoster with nervous system complication 16/94/5038   Hesitancy of micturition    Hyperlipidemia    Hypothyroidism    Impacted cerumen, bilateral    Jaw pain    Lesion of femoral nerve 04/30/2013   Localized edema    Male erectile disorder    Malignant neoplasm of overlapping sites of bladder ([place])    Nasal congestion 07/01/2018   Nasal turbinate hypertrophy 07/01/2018   Obstructive sleep apnea 01/17/2017   Other constipation    Other fatigue    Overweight    Pacemaker reprogramming/check 02/12/2017   Pain in right finger(s)    Pain in right lower leg     Paroxysmal atrial fibrillation ([place]) 06/08/2016   Peptic ulcer disease    Persistent atrial fibrillation ([place]) 06/01/2017   Primary insomnia    Renal stones    Renal stones    Secondary hypercoagulable state ([place]) 06/17/2020   Shingles 8828   WITH COMPLICATIONS (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)   Shingles 06/2012   Shortness of breath    Sick sinus syndrome ([place])    Sinus bradycardia 03/07/2017   Sleep apnea    dx 08/2015, CPAP   Spontaneous ecchymoses    Status post ablation of atrial fibrillation 09/16/2020   Testicular hypofunction    Thoracic or lumbosacral neuritis or radiculitis 06/03/2013   Trigger middle finger of right hand 11/28/2019   Trigger middle finger of right hand 11/28/2019   Type 2 diabetes mellitus with circulatory disorder, 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[place]) 01/17/2017    Past Surgical History:  Procedure Laterality Date   ABLATION  06/2017   ATRIAL FIBRILLATION ABLATION N/A 05/21/2020   Procedure: ATRIAL FIBRILLATION ABLATION;  Surgeon: Constance Haw, MD;  Location: [facility];  Service: Cardiovascular;  Laterality: N/A;   CARDIOVERSION N/A 03/22/2018   Procedure: CARDIOVERSION;  Surgeon: Buford Dresser, MD;  Location: [facility];  Service: Cardiovascular;  Laterality: N/A;  CARDIOVERSION N/A 07/08/2020   Procedure: CARDIOVERSION;  Surgeon: Jerline Pain, MD;  Location[facility];  Service: Cardiovascular;  Laterality: N/A;   HEMIARTHROPLASTY HIP  10/31/2013   IT HIP BIPOLAR   LUMBAR SPINE SURGERY     PACEMAKER PLACEMENT  01/29/2017   SHOULDER SURGERY Right     Family History  Problem Relation Age of Onset   Heart attack Mother    Transient ischemic attack Mother    Heart Problems Father    Tuberculosis Brother    Diabetes type II Other    Hyperlipidemia Other    Congestive Heart Failure Other    Arthritis Other     Social History   Socioeconomic History   Marital status:  Married    Spouse name: [name]   Number of children: 3   Years of education: Not on file   Highest education level: Not on file  Occupational History   Occupation: RETIRED    Comment: school principal  Tobacco Use   Smoking status: Never   Smokeless tobacco: Never  Vaping Use   Vaping Use: Never used  Substance and Sexual Activity   Alcohol use: Not Currently   Drug use: Never   Sexual activity: Not on file  Other Topics Concern   Not on file  Social History Narrative   Lives with wife   Social Determinants of Health   Financial Resource Strain: Low Risk  (11/24/2021)   Overall Financial Resource Strain (CARDIA)    Difficulty of Paying Living Expenses: Not hard at all  Food Insecurity: No Food Insecurity (11/24/2021)   Hunger Vital Sign    Worried About Running Out of Food in the Last Year: Never true    [place] in the Last Year: Never true  Transportation Needs: No Transportation Needs (11/24/2021)   PRAPARE - Hydrologist (Medical): No    Lack of Transportation (Non-Medical): No  Physical Activity: Sufficiently Active (11/24/2021)   Exercise Vital Sign    Days of Exercise per Week: 4 days    Minutes of Exercise per Session: 60 min  Stress: No Stress Concern Present (11/24/2021)   [place]    Feeling of Stress : Not at all  Social Connections: Socially Integrated (11/24/2021)   Social Connection and Isolation Panel [NHANES]    Frequency of Communication with Friends and Family: More than three times a week    Frequency of Social Gatherings with Friends and Family: More than three times a week    Attends Religious Services: More than 4 times per year    Active Member of [employer] or Organizations: Yes    Attends Archivist Meetings: More than 4 times per year    Marital Status: Married  Human resources officer Violence: Not At Risk (11/24/2021)   Humiliation, Afraid, Rape, and  Kick questionnaire    Fear of Current or Ex-Partner: No    Emotionally Abused: No    Physically Abused: No    Sexually Abused: No    Outpatient Medications Prior to Visit  Medication Sig Dispense Refill   amiodarone (PACERONE) 200 MG tablet TAKE ONE TABLET BY MOUTH EVERY DAY 90 tablet 1   atorvastatin (LIPITOR) 10 MG tablet TAKE ONE TABLET BY MOUTH EVERY EVENING 90 tablet 0   B-D UF III MINI PEN NEEDLES 31G X 5 MM MISC SMARTSIG:injection Daily     carvedilol (COREG) 12.5 MG tablet Take 1 tablet (12.5  mg total) by mouth 2 (two) times daily. 180 tablet 3   Continuous Blood Gluc Sensor (DEXCOM G7 SENSOR) MISC To use as directed to monitor glucose 3 each 3   FARXIGA 10 MG TABS tablet TAKE ONE TABLET BY MOUTH EVERY DAY 90 tablet 0   furosemide (LASIX) 40 MG tablet Take 40 mg by mouth daily.     gabapentin (NEURONTIN) 300 MG capsule Take 1 capsule (300 mg total) by mouth 3 (three) times daily. 270 capsule 1   glipiZIDE (GLUCOTROL XL) 10 MG 24 hr tablet TAKE ONE TABLET BY MOUTH TWICE DAILY (Patient taking differently: Take 10 mg by mouth daily.) 180 tablet 1   levothyroxine (SYNTHROID) 100 MCG tablet TAKE ONE TABLET BY MOUTH DAILY 90 tablet 0   TOUJEO SOLOSTAR 300 UNIT/ML Solostar Pen Inject 20 Units into the skin daily at 6 (six) AM. 4.5 mL 1   XARELTO 20 MG TABS tablet TAKE ONE TABLET BY MOUTH EVERY EVENING 90 tablet 1   famotidine (PEPCID) 40 MG tablet Take 1 tablet (40 mg total) by mouth daily. 90 tablet 1   testosterone cypionate (DEPOTESTOSTERONE CYPIONATE) 200 MG/ML injection Inject 1 mL (200 mg total) into the muscle every 14 (fourteen) days. INJECT 1 MILLILITER IN THE MUSCLE EVERY 2 WEEKS 10 mL 0   No facility-administered medications prior to visit.    Allergies  Allergen Reactions   Propranolol     Drops HR too low  Other reaction(s): Other (See Comments) Drops HR too low Drops HR too low Drops HR too low   Canagliflozin     Patient had an adverse reaction Other reaction(s):  Other (See Comments) Patient had an adverse reaction Patient had an adverse reaction   Clonidine Derivatives Nausea And Vomiting   Hydralazine Hcl     Patient had an adverse reaction    Metformin And Related Diarrhea   Topiramate     Patient had an adverse reaction Other reaction(s): Other (See Comments) Patient had an adverse reaction Patient had an adverse reaction   Ambien [Zolpidem] Other (See Comments)    Amnesia    Ozempic (0.25 Or 0.5 Mg-Dose) [Semaglutide(0.25 Or 0.31m-Dos)] Other (See Comments)    Nausea, abdominal pain     Review of Systems  Constitutional:  Negative for chills, fatigue and fever.  HENT:  Negative for congestion, ear pain and sore throat.   Respiratory:  Negative for cough and shortness of breath.   Cardiovascular:  Negative for chest pain and palpitations.  Gastrointestinal:  Negative for abdominal pain, constipation, diarrhea, nausea and vomiting.  Endocrine: Negative for polydipsia, polyphagia and polyuria.  Genitourinary:  Negative for dysuria and frequency.  Musculoskeletal:  Negative for arthralgias and back pain.  Neurological:  Negative for dizziness and headaches.  Psychiatric/Behavioral:  Negative for dysphoric mood. The patient is not nervous/anxious.        Objective:    Physical Exam Vitals reviewed.  Constitutional:      Appearance: Normal appearance.  Eyes:     Conjunctiva/sclera: Conjunctivae normal.     Comments: Ptosis of BL eye (Right > left eye) Eye lashes inversion mild BL.   Neurological:     Mental Status: He is alert.     BP 120/60   Pulse 67   Temp (!) 97.2 F (36.2 C)   Resp 16   Ht _0  (1.905 m)   Wt 237 lb (107.5 kg)   SpO2 97%   BMI 29.62 kg/m  Wt Readings from Last 3 Encounters:  05/30/22 237 lb (107.5 kg)  05/16/22 240 lb (108.9 kg)  05/10/22 240 lb (108.9 kg)    Health Maintenance Due  Topic Date Due   DTaP/Tdap/Td (1 - Tdap) Never done   COVID-19 Vaccine (5 - 2023-24 season) 05/30/2022     There are no preventive care reminders to display for this patient.   Lab Results  Component Value Date   TSH 2.170 11/01/2021   Lab Results  Component Value Date   WBC 3.9 02/13/2022   HGB 15.8 02/13/2022   HCT 45.9 02/13/2022   MCV 98 (H) 02/13/2022   PLT 138 (L) 02/13/2022   Lab Results  Component Value Date   NA 139 05/10/2022   K 5.2 05/10/2022   CO2 23 05/10/2022   GLUCOSE 254 (H) 05/10/2022   BUN 45 (H) 05/10/2022   CREATININE 2.00 (H) 05/10/2022   BILITOT 0.8 05/01/2022   ALKPHOS 118 05/01/2022   AST 20 05/01/2022   ALT 16 05/01/2022   PROT 7.0 05/01/2022   ALBUMIN 4.1 05/01/2022   CALCIUM 8.5 (L) 05/10/2022   EGFR 33 (L) 05/10/2022   Lab Results  Component Value Date   CHOL 113 02/13/2022   Lab Results  Component Value Date   HDL 51 02/13/2022   Lab Results  Component Value Date   LDLCALC 46 02/13/2022   Lab Results  Component Value Date   TRIG 80 02/13/2022   Lab Results  Component Value Date   CHOLHDL 2.2 02/13/2022   Lab Results  Component Value Date   HGBA1C 7.1 (H) 02/13/2022       Assessment & Plan:   Problem List Items Addressed This Visit       Other   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       Relevant Orders   Ambulatory referral to Ophthalmology   Entropion of both eyes    Refer back to [facility] for possible blepheroplasty BL.  Continue to use gentle shampoo (baby shampoo no tears.) I requested records from recent eye exams.      Relevant Orders   Ambulatory referral to Ophthalmology   No orders of the defined types were placed in this encounter.   Orders Placed This Encounter  Procedures   Ambulatory referral to Ophthalmology     Follow-up: No follow-ups on file.  An After Visit Summary was printed and given to the patient.  _0 @ [place] [phone]

## 2022-05-30 NOTE — Assessment & Plan Note (Signed)
Refer back to [facility] for possible blepheroplasty BL.

## 2022-05-31 ENCOUNTER — Encounter: Payer: Self-pay | Admitting: Family Medicine

## 2022-06-08 ENCOUNTER — Ambulatory Visit (INDEPENDENT_AMBULATORY_CARE_PROVIDER_SITE_OTHER): Payer: Medicare Other

## 2022-06-08 DIAGNOSIS — I495 Sick sinus syndrome: Secondary | ICD-10-CM | POA: Diagnosis not present

## 2022-06-08 LAB — CUP PACEART REMOTE DEVICE CHECK
Date Time Interrogation Session: 20231221124919
Implantable Lead Connection Status: 753985
Implantable Lead Connection Status: 753985
Implantable Lead Implant Date: 20180813
Implantable Lead Implant Date: 20180813
Implantable Lead Location: 753859
Implantable Lead Location: 753860
Implantable Lead Model: 377
Implantable Lead Model: 377
Implantable Lead Serial Number: 49893169
Implantable Lead Serial Number: 50011411
Implantable Pulse Generator Implant Date: 20180813
Pulse Gen Model: 407145
Pulse Gen Serial Number: 69158272

## 2022-06-15 ENCOUNTER — Other Ambulatory Visit: Payer: Self-pay | Admitting: Family Medicine

## 2022-06-26 DIAGNOSIS — H04129 Dry eye syndrome of unspecified lacrimal gland: Secondary | ICD-10-CM | POA: Diagnosis not present

## 2022-06-29 ENCOUNTER — Other Ambulatory Visit: Payer: Self-pay | Admitting: Family Medicine

## 2022-06-29 DIAGNOSIS — E08621 Diabetes mellitus due to underlying condition with foot ulcer: Secondary | ICD-10-CM

## 2022-06-30 NOTE — Progress Notes (Signed)
Remote pacemaker transmission.   

## 2022-07-13 DIAGNOSIS — H25812 Combined forms of age-related cataract, left eye: Secondary | ICD-10-CM | POA: Diagnosis not present

## 2022-07-13 DIAGNOSIS — Z01818 Encounter for other preprocedural examination: Secondary | ICD-10-CM | POA: Diagnosis not present

## 2022-07-15 ENCOUNTER — Other Ambulatory Visit: Payer: Self-pay | Admitting: Family Medicine

## 2022-07-18 NOTE — Assessment & Plan Note (Signed)
Well controlled.  ?No changes to medicines.  ?Continue to work on eating a healthy diet and exercise.  ?Labs drawn today.  ?

## 2022-07-18 NOTE — Assessment & Plan Note (Signed)
The current medical regimen is effective;  continue present plan and medications.  

## 2022-07-18 NOTE — Progress Notes (Unsigned)
Subjective:  Patient ID: Paul Kent, male    DOB: 1941/01/30  Age: 82 y.o. MRN: [id number]  Chief Complaint  Patient presents with   Diabetes   Hyperlipidemia    HPI    Diabetes:  Complications: neuropathy Glucose checking:once daily in am.  Glucose logs: 85-115 Hypoglycemia: in the middle of the night due to symptoms consistent with hypoglycemia.  Most recent A1C: 7.1% Current medications: Glipizide 10 mg twice a day, farxiga 10 mg daily, and insulin glargine 20 U before bed. . Last Eye Exam: 01/16/2022. Foot checks: wound care visits weekly have been completed. Spraying "tuff skin".on feet. No open wounds.     Hyperlipidemia: Current medications: Atorvastatin 10 mg daily.  Hypertension: Current medications: Carvedilol 12.5 mg twice a day.  GERD: Famotidine 40 mg daily.  Afib: Taking Amiodarone 200 mg everyday, carvedilol 12.5 mg twice daily. xarelto 20 mg every evening.  Hypothyroidism: He takes Levothyroxine 100 mcg every morning.  Diet: eating healthy.  Exercise: still going to gym.  Patient is having urinary hesitancy. No nocturia. No dysuria.   Chronic respiratory failure: Wears oxygen 2 L at night. Hypogonadism: has testosterone shots, but has not had in a couple of months.   Scheduled to get a cataract next week. Using restasis for dry eyes. Next month is scheduled to be evaluated for blepharoplasty.      02/13/2022    9:10 AM 11/24/2021    9:00 AM 04/06/2021   10:56 AM 01/20/2021    8:51 AM 11/16/2020    9:21 AM  Depression screen PHQ 2/9  Decreased Interest 0 0 0 0 0  Down, Depressed, Hopeless 0 0 0 0 0  PHQ - 2 Score 0 0 0 0 0         05/30/2021   11:16 AM 07/29/2021    8:27 AM 11/24/2021    9:06 AM 02/13/2022    9:10 AM 07/19/2022    8:23 AM  Fall Risk  Falls in the past year? 0 0 0 1 1  Was there an injury with Fall? 0 0 0 1 0  Fall Risk Category Calculator 0 0 0 2 1  Fall Risk Category (Retired) Low Low Low Moderate   (RETIRED) Patient  Fall Risk Level Moderate fall risk  Moderate fall risk Moderate fall risk   Patient at Risk for Falls Due to Impaired balance/gait  Impaired balance/gait Impaired mobility Impaired balance/gait  Fall risk Follow up Falls evaluation completed Falls evaluation completed Falls evaluation completed;Education provided Falls evaluation completed Falls evaluation completed;Education provided      Review of Systems  Constitutional:  Negative for appetite change, fatigue and fever.  HENT:  Negative for congestion, ear pain, sinus pressure and sore throat.   Eyes:  Positive for redness (Dryness).  Respiratory:  Negative for cough, chest tightness, shortness of breath and wheezing.   Cardiovascular:  Negative for chest pain and palpitations.  Gastrointestinal:  Negative for abdominal pain, constipation, diarrhea, nausea and vomiting.  Genitourinary:  Positive for dysuria. Negative for hematuria.  Musculoskeletal:  Negative for arthralgias, back pain, joint swelling and myalgias.  Skin:  Negative for rash.  Neurological:  Negative for dizziness, weakness and headaches.  Psychiatric/Behavioral:  Negative for dysphoric mood. The patient is not nervous/anxious.     Current Outpatient Medications on File Prior to Visit  Medication Sig Dispense Refill   amiodarone (PACERONE) 200 MG tablet TAKE ONE TABLET BY MOUTH EVERY DAY 90 tablet 1   atorvastatin (LIPITOR) 10 MG tablet  TAKE ONE TABLET BY MOUTH EVERY EVENING 90 tablet 0   B-D UF III MINI PEN NEEDLES 31G X 5 MM MISC SMARTSIG:injection Daily     carvedilol (COREG) 12.5 MG tablet Take 1 tablet (12.5 mg total) by mouth 2 (two) times daily. 180 tablet 3   Continuous Blood Gluc Sensor (DEXCOM G7 SENSOR) MISC To use as directed to monitor glucose 3 each 3   dapagliflozin propanediol (FARXIGA) 10 MG TABS tablet TAKE ONE TABLET BY MOUTH EVERY DAY 90 tablet 0   famotidine (PEPCID) 40 MG tablet Take 1 tablet (40 mg total) by mouth daily. 90 tablet 0   furosemide  (LASIX) 40 MG tablet Take 40 mg by mouth daily.     gabapentin (NEURONTIN) 300 MG capsule Take 1 capsule (300 mg total) by mouth 2 (two) times daily. 180 capsule 1   levothyroxine (SYNTHROID) 100 MCG tablet TAKE ONE TABLET BY MOUTH DAILY 90 tablet 0   testosterone cypionate (DEPOTESTOSTERONE CYPIONATE) 200 MG/ML injection INJECT '200MG'$  (1 MILLILITER) into the muscle EVERY 14 DAYS 10 mL 0   TOUJEO SOLOSTAR 300 UNIT/ML Solostar Pen Inject 20 Units into the skin daily at 6 (six) AM. 4.5 mL 1   XARELTO 20 MG TABS tablet TAKE ONE TABLET BY MOUTH EVERY EVENING 90 tablet 1   No current facility-administered medications on file prior to visit.   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   Cellulitis of right lower limb    CHF (congestive heart failure) ([place]) 02/2018   Chronic atrial fibrillation (HCC)    Chronic ulcer of right heel with fat layer exposed ([place]) 04/26/2021   Corns and callosities    Deviated septum 07/01/2018   Deviated septum 07/01/2018   Diabetes mellitus due to underlying condition with unspecified complications ([place]) 62/26/3335   Diabetic ulcer of right heel associated with diabetes mellitus due to underlying condition, limited to breakdown of skin ([place]) 07/31/2021   Disturbances of salivary secretion    Epistaxis    Essential hypertension    Essential tremor    H pylori ulcer    H pylori ulcer    Herpes zoster with nervous system complication 45/62/5638   Hesitancy of micturition    Hyperlipidemia    Hypothyroidism    Impacted cerumen, bilateral    Jaw pain    Lesion of femoral nerve 04/30/2013   Localized edema    Male erectile disorder    Malignant neoplasm of overlapping sites of bladder ([place])    Nasal congestion 07/01/2018   Nasal  turbinate hypertrophy 07/01/2018   Obstructive sleep apnea 01/17/2017   Other constipation    Other fatigue    Overweight    Pacemaker reprogramming/check 02/12/2017   Pain in right finger(s)    Pain in right lower leg    Paroxysmal atrial fibrillation ([place]) 06/08/2016   Peptic ulcer disease    Persistent atrial fibrillation ([place]) 06/01/2017   Primary insomnia    Renal stones    Renal stones    Secondary hypercoagulable state ([place]) 06/17/2020   Shingles 9373   WITH COMPLICATIONS (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)   Shingles 06/2012   Shortness of breath    Sick sinus syndrome ([place])  Sinus bradycardia 03/07/2017   Sleep apnea    dx 08/2015, CPAP   Spontaneous ecchymoses    Status post ablation of atrial fibrillation 09/16/2020   Testicular hypofunction    Thoracic or lumbosacral neuritis or radiculitis 06/03/2013   Trigger middle finger of right hand 11/28/2019   Trigger middle finger of right hand 11/28/2019   Type 2 diabetes mellitus with circulatory disorder, 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[place]) 01/17/2017   Past Surgical History:  Procedure Laterality Date   ABLATION  06/2017   ATRIAL FIBRILLATION ABLATION N/A 05/21/2020   Procedure: ATRIAL FIBRILLATION ABLATION;  Surgeon: Constance Haw, MD;  Location: [facility];  Service: Cardiovascular;  Laterality: N/A;   CARDIOVERSION N/A 03/22/2018   Procedure: CARDIOVERSION;  Surgeon: Buford Dresser, MD;  [facility] ENDOSCOPY;  Service: Cardiovascular;  Laterality: N/A;   CARDIOVERSION N/A 07/08/2020   Procedure: CARDIOVERSION;  Surgeon: Jerline Pain, MD;  Location: [facility] ENDOSCOPY;  Service: Cardiovascular;  Laterality: N/A;   HEMIARTHROPLASTY HIP  10/31/2013   IT HIP BIPOLAR   LUMBAR SPINE SURGERY     PACEMAKER PLACEMENT  01/29/2017   SHOULDER SURGERY Right     Family History  Problem Relation Age of Onset   Heart attack Mother    Transient ischemic attack Mother     Heart Problems Father    Tuberculosis Brother    Diabetes type II Other    Hyperlipidemia Other    Congestive Heart Failure Other    Arthritis Other    Social History   Socioeconomic History   Marital status: Married    Spouse name: [name]   Number of children: 3   Years of education: Not on file   Highest education level: Not on file  Occupational History   Occupation: RETIRED    Comment: school principal  Tobacco Use   Smoking status: Never   Smokeless tobacco: Never  Vaping Use   Vaping Use: Never used  Substance and Sexual Activity   Alcohol use: Not Currently   Drug use: Never   Sexual activity: Not on file  Other Topics Concern   Not on file  Social History Narrative   Lives with wife   Social Determinants of Health   Financial Resource Strain: Low Risk  (11/24/2021)   Overall Financial Resource Strain (CARDIA)    Difficulty of Paying Living Expenses: Not hard at all  Food Insecurity: No Food Insecurity (11/24/2021)   Hunger Vital Sign    Worried About Running Out of Food in the Last Year: Never true    [place] in the Last Year: Never true  Transportation Needs: No Transportation Needs (11/24/2021)   PRAPARE - Hydrologist (Medical): No    Lack of Transportation (Non-Medical): No  Physical Activity: Sufficiently Active (11/24/2021)   Exercise Vital Sign    Days of Exercise per Week: 4 days    Minutes of Exercise per Session: 60 min  Stress: No Stress Concern Present (11/24/2021)   [place]    Feeling of Stress : Not at all  Social Connections: [place] (11/24/2021)   Social Connection and Isolation Panel [NHANES]    Frequency of Communication with Friends and Family: More than three times a week    Frequency of Social Gatherings with Friends and Family: More than three times a week    Attends Religious Services: More than 4 times per year  Active  Member of Clubs or Organizations: Yes    Attends Archivist Meetings: More than 4 times per year    Marital Status: Married    Objective:  BP 118/68 (BP Location: Left Arm, Patient Position: Sitting)   Pulse 61   Temp (!) 97.5 F (36.4 C) (Temporal)   Ht '6\' 3"'$  (1.905 m)   Wt 247 lb (112 kg)   SpO2 98%   BMI 30.87 kg/m      07/19/2022    8:30 AM 05/30/2022   10:49 AM 05/16/2022    8:25 AM  BP/Weight  Systolic BP 956 213   Diastolic BP 68 60   Wt. (Lbs) 247 237 240  BMI 30.87 kg/m2 29.62 kg/m2 30 kg/m2    Physical Exam Vitals reviewed.  Constitutional:      Appearance: Normal appearance.  Neck:     Vascular: No carotid bruit.  Cardiovascular:     Rate and Rhythm: Normal rate and regular rhythm.     Heart sounds: Normal heart sounds.  Pulmonary:     Effort: Pulmonary effort is normal.     Breath sounds: Normal breath sounds.  Abdominal:     General: Abdomen is flat. Bowel sounds are normal.     Palpations: Abdomen is soft.     Tenderness: There is no abdominal tenderness.  Neurological:     Mental Status: He is alert and oriented to person, place, and time.  Psychiatric:        Mood and Affect: Mood normal.        Behavior: Behavior normal.     Diabetic Foot Exam - Simple   Simple Foot Form Diabetic Foot exam was performed with the following findings: Yes 07/19/2022  9:03 AM  Visual Inspection See comments: Yes Sensation Testing Intact to touch and monofilament testing bilaterally: Yes Pulse Check Posterior Tibialis and Dorsalis pulse intact bilaterally: Yes Comments Rubor of left distal foot. Claw toes.  Right heel is healed.       Lab Results  Component Value Date   WBC 4.4 07/19/2022   HGB 14.0 07/19/2022   HCT 42.4 07/19/2022   PLT 147 (L) 07/19/2022   GLUCOSE 123 (H) 07/19/2022   CHOL 129 07/19/2022   TRIG 56 07/19/2022   HDL 55 07/19/2022   LDLCALC 62 07/19/2022   ALT 19 07/19/2022   AST 20 07/19/2022   NA 140 07/19/2022   K  4.9 07/19/2022   CL 105 07/19/2022   CREATININE 1.55 (H) 07/19/2022   BUN 28 (H) 07/19/2022   CO2 21 07/19/2022   TSH 9.280 (H) 07/19/2022   HGBA1C 7.3 (H) 07/19/2022   MICROALBUR 80 01/20/2021      Assessment & Plan:    Chronic atrial fibrillation ([place]) Assessment & Plan: The current medical regimen is effective;  continue present plan and medications.    Hypertensive heart and renal disease, stage 1-4 or unspecified chronic kidney disease, with heart failure ([place]) Assessment & Plan: Well controlled.  No changes to medicines.  Continue to work on eating a healthy diet and exercise.  Labs drawn today.    Orders: -     Comprehensive metabolic panel -     CBC with Differential/Platelet -     Cardiovascular Risk Assessment  Diabetic polyneuropathy associated with type 2 diabetes mellitus ([place]) Assessment & Plan: Control: good Recommend check sugars fasting daily. Recommend check feet daily. Recommend annual eye exams. Medicines: Continue Glipizide 10 mg twice a day, farxiga 10 mg daily,  and insulin glargine 20 U before bed. . Continue to work on eating a healthy diet and exercise.  Labs drawn today.     Orders: -     Hemoglobin A1c -     Microalbumin / creatinine urine ratio -     glipiZIDE ER; Take 1 tablet (10 mg total) by mouth daily.  Dispense: 1 tablet; Refill: 0  Acquired hypothyroidism Assessment & Plan: Previously well controlled Continue Synthroid at current dose  Recheck TSH and adjust Synthroid as indicated    Orders: -     TSH  Testicular hypofunction Assessment & Plan: No change felt with testosterorone. Patient will not restart.    Mixed hyperlipidemia Assessment & Plan: Well controlled.  No changes to medicines.  Continue to work on eating a healthy diet and exercise.  Labs drawn today.    Orders: -     Lipid panel  Polyuria -     POCT urinalysis dipstick -     Tamsulosin HCl; Take 1 capsule (0.4 mg total) by mouth daily after  supper.  Dispense: 30 capsule; Refill: 1  Acquired bilateral hammer toes Assessment & Plan: stable   Acquired thrombophilia ([place]) Assessment & Plan: On Xarelto for atrial fibrillation   Chronic respiratory failure with hypoxia (HCC) Assessment & Plan: Continue with oxygen 2 L at night.    Chronic kidney disease, stage 3b ([place]) Assessment & Plan: Stable. Consider kerendia.    Enlarged prostate with urinary obstruction Assessment & Plan: Start on Flomax 0.4 mg nightly.  Patient scheduled to see urology in 2 weeks.  Sending urine culture.   OSA on CPAP Assessment & Plan: Conitnue wih CPAP with oxygen 2 L at night.       Meds ordered this encounter  Medications   tamsulosin (FLOMAX) 0.4 MG CAPS capsule    Sig: Take 1 capsule (0.4 mg total) by mouth daily after supper.    Dispense:  30 capsule    Refill:  1   glipiZIDE (GLUCOTROL XL) 10 MG 24 hr tablet    Sig: Take 1 tablet (10 mg total) by mouth daily.    Dispense:  1 tablet    Refill:  0    This prescription was filled on 03/06/2022. Any refills authorized will be placed on file.    Orders Placed This Encounter  Procedures   Comprehensive metabolic panel   Hemoglobin A1c   Lipid panel   CBC with Differential/Platelet   TSH   Microalbumin / creatinine urine ratio   Cardiovascular Risk Assessment   POCT urinalysis dipstick     Follow-up: Return in about 3 months (around 10/17/2022) for chronic fasting.  An After Visit Summary was printed and given to the patient.   I,Lauren M Auman,acting as a scribe for [name], MD.,have documented all relevant documentation on the behalf of [name], MD,as directed by  [name], MD while in the presence of [name], MD.    [name], MD [place] [phone]

## 2022-07-18 NOTE — Assessment & Plan Note (Signed)
Previously well controlled Continue Synthroid at current dose  Recheck TSH and adjust Synthroid as indicated   

## 2022-07-18 NOTE — Assessment & Plan Note (Signed)
Control: good Recommend check sugars fasting daily. Recommend check feet daily. Recommend annual eye exams. Medicines: Continue Glipizide 10 mg twice a day, farxiga 10 mg daily, and insulin glargine 20 U before bed. . Continue to work on eating a healthy diet and exercise.  Labs drawn today.

## 2022-07-19 ENCOUNTER — Ambulatory Visit (INDEPENDENT_AMBULATORY_CARE_PROVIDER_SITE_OTHER): Payer: Medicare Other | Admitting: Family Medicine

## 2022-07-19 ENCOUNTER — Encounter: Payer: Self-pay | Admitting: Family Medicine

## 2022-07-19 VITALS — BP 118/68 | HR 61 | Temp 97.5°F | Ht 75.0 in | Wt 247.0 lb

## 2022-07-19 DIAGNOSIS — J9611 Chronic respiratory failure with hypoxia: Secondary | ICD-10-CM

## 2022-07-19 DIAGNOSIS — E1142 Type 2 diabetes mellitus with diabetic polyneuropathy: Secondary | ICD-10-CM | POA: Diagnosis not present

## 2022-07-19 DIAGNOSIS — M2041 Other hammer toe(s) (acquired), right foot: Secondary | ICD-10-CM

## 2022-07-19 DIAGNOSIS — M2042 Other hammer toe(s) (acquired), left foot: Secondary | ICD-10-CM

## 2022-07-19 DIAGNOSIS — D6869 Other thrombophilia: Secondary | ICD-10-CM

## 2022-07-19 DIAGNOSIS — I130 Hypertensive heart and chronic kidney disease with heart failure and stage 1 through stage 4 chronic kidney disease, or unspecified chronic kidney disease: Secondary | ICD-10-CM | POA: Diagnosis not present

## 2022-07-19 DIAGNOSIS — R300 Dysuria: Secondary | ICD-10-CM

## 2022-07-19 DIAGNOSIS — N138 Other obstructive and reflux uropathy: Secondary | ICD-10-CM

## 2022-07-19 DIAGNOSIS — N1832 Chronic kidney disease, stage 3b: Secondary | ICD-10-CM

## 2022-07-19 DIAGNOSIS — I482 Chronic atrial fibrillation, unspecified: Secondary | ICD-10-CM

## 2022-07-19 DIAGNOSIS — N401 Enlarged prostate with lower urinary tract symptoms: Secondary | ICD-10-CM

## 2022-07-19 DIAGNOSIS — G250 Essential tremor: Secondary | ICD-10-CM

## 2022-07-19 DIAGNOSIS — E291 Testicular hypofunction: Secondary | ICD-10-CM | POA: Diagnosis not present

## 2022-07-19 DIAGNOSIS — E039 Hypothyroidism, unspecified: Secondary | ICD-10-CM

## 2022-07-19 DIAGNOSIS — G4733 Obstructive sleep apnea (adult) (pediatric): Secondary | ICD-10-CM

## 2022-07-19 DIAGNOSIS — E782 Mixed hyperlipidemia: Secondary | ICD-10-CM | POA: Diagnosis not present

## 2022-07-19 DIAGNOSIS — R3589 Other polyuria: Secondary | ICD-10-CM

## 2022-07-19 LAB — POCT URINALYSIS DIPSTICK
Bilirubin, UA: NEGATIVE
Glucose, UA: POSITIVE — AB
Ketones, UA: NEGATIVE
Leukocytes, UA: NEGATIVE
Nitrite, UA: NEGATIVE
Protein, UA: NEGATIVE
Spec Grav, UA: 1.02 (ref 1.010–1.025)
Urobilinogen, UA: NEGATIVE E.U./dL — AB
pH, UA: 5.5 (ref 5.0–8.0)

## 2022-07-19 MED ORDER — TAMSULOSIN HCL 0.4 MG PO CAPS: 0.4000 mg | ORAL_CAPSULE | Freq: Every day | ORAL | 1 refills | Status: DC

## 2022-07-19 NOTE — Assessment & Plan Note (Signed)
stable °

## 2022-07-19 NOTE — Assessment & Plan Note (Signed)
The current medical regimen is effective;  continue present plan and medications.  

## 2022-07-19 NOTE — Assessment & Plan Note (Signed)
On Xarelto for atrial fibrillation

## 2022-07-19 NOTE — Assessment & Plan Note (Signed)
Start on Flomax 0.4 mg nightly.  Patient scheduled to see urology in 2 weeks.  Sending urine culture.

## 2022-07-21 LAB — COMPREHENSIVE METABOLIC PANEL
ALT: 19 IU/L (ref 0–44)
AST: 20 IU/L (ref 0–40)
Albumin/Globulin Ratio: 1.6 (ref 1.2–2.2)
Albumin: 4 g/dL (ref 3.7–4.7)
Alkaline Phosphatase: 113 IU/L (ref 44–121)
BUN/Creatinine Ratio: 18 (ref 10–24)
BUN: 28 mg/dL — ABNORMAL HIGH (ref 8–27)
Bilirubin Total: 0.6 mg/dL (ref 0.0–1.2)
CO2: 21 mmol/L (ref 20–29)
Calcium: 8.7 mg/dL (ref 8.6–10.2)
Chloride: 105 mmol/L (ref 96–106)
Creatinine, Ser: 1.55 mg/dL — ABNORMAL HIGH (ref 0.76–1.27)
Globulin, Total: 2.5 g/dL (ref 1.5–4.5)
Glucose: 123 mg/dL — ABNORMAL HIGH (ref 70–99)
Potassium: 4.9 mmol/L (ref 3.5–5.2)
Sodium: 140 mmol/L (ref 134–144)
Total Protein: 6.5 g/dL (ref 6.0–8.5)
eGFR: 44 mL/min/{1.73_m2} — ABNORMAL LOW (ref >59)

## 2022-07-21 LAB — TSH: TSH: 9.28 u[IU]/mL — ABNORMAL HIGH (ref 0.450–4.500)

## 2022-07-21 LAB — CBC WITH DIFFERENTIAL/PLATELET
Basophils Absolute: 0.1 10*3/uL (ref 0.0–0.2)
Basos: 2 %
EOS (ABSOLUTE): 0.2 10*3/uL (ref 0.0–0.4)
Eos: 3 %
Hematocrit: 42.4 % (ref 37.5–51.0)
Hemoglobin: 14 g/dL (ref 13.0–17.7)
Immature Grans (Abs): 0 10*3/uL (ref 0.0–0.1)
Immature Granulocytes: 0 %
Lymphocytes Absolute: 0.9 10*3/uL (ref 0.7–3.1)
Lymphs: 21 %
MCH: 33 pg (ref 26.6–33.0)
MCHC: 33 g/dL (ref 31.5–35.7)
MCV: 100 fL — ABNORMAL HIGH (ref 79–97)
Monocytes Absolute: 0.7 10*3/uL (ref 0.1–0.9)
Monocytes: 15 %
Neutrophils Absolute: 2.6 10*3/uL (ref 1.4–7.0)
Neutrophils: 59 %
Platelets: 147 10*3/uL — ABNORMAL LOW (ref 150–450)
RBC: 4.24 x10E6/uL (ref 4.14–5.80)
RDW: 13 % (ref 11.6–15.4)
WBC: 4.4 10*3/uL (ref 3.4–10.8)

## 2022-07-21 LAB — HEMOGLOBIN A1C
Est. average glucose Bld gHb Est-mCnc: 163 mg/dL
Hgb A1c MFr Bld: 7.3 % — ABNORMAL HIGH (ref 4.8–5.6)

## 2022-07-21 LAB — LIPID PANEL
Chol/HDL Ratio: 2.3 ratio (ref 0.0–5.0)
Cholesterol, Total: 129 mg/dL (ref 100–199)
HDL: 55 mg/dL (ref >39)
LDL Chol Calc (NIH): 62 mg/dL (ref 0–99)
Triglycerides: 56 mg/dL (ref 0–149)
VLDL Cholesterol Cal: 12 mg/dL (ref 5–40)

## 2022-07-21 LAB — MICROALBUMIN / CREATININE URINE RATIO
Creatinine, Urine: 75 mg/dL
Microalb/Creat Ratio: 75 mg/g creat — ABNORMAL HIGH (ref 0–29)
Microalbumin, Urine: 55.9 ug/mL

## 2022-07-21 LAB — CARDIOVASCULAR RISK ASSESSMENT

## 2022-07-22 DIAGNOSIS — G4730 Sleep apnea, unspecified: Secondary | ICD-10-CM | POA: Diagnosis not present

## 2022-07-22 DIAGNOSIS — G4733 Obstructive sleep apnea (adult) (pediatric): Secondary | ICD-10-CM | POA: Diagnosis not present

## 2022-07-22 DIAGNOSIS — I4891 Unspecified atrial fibrillation: Secondary | ICD-10-CM | POA: Diagnosis not present

## 2022-07-22 DIAGNOSIS — I129 Hypertensive chronic kidney disease with stage 1 through stage 4 chronic kidney disease, or unspecified chronic kidney disease: Secondary | ICD-10-CM | POA: Diagnosis not present

## 2022-07-22 DIAGNOSIS — Z79899 Other long term (current) drug therapy: Secondary | ICD-10-CM | POA: Diagnosis not present

## 2022-07-22 DIAGNOSIS — M25559 Pain in unspecified hip: Secondary | ICD-10-CM | POA: Diagnosis not present

## 2022-07-22 DIAGNOSIS — S32592A Other specified fracture of left pubis, initial encounter for closed fracture: Secondary | ICD-10-CM | POA: Diagnosis not present

## 2022-07-22 DIAGNOSIS — N183 Chronic kidney disease, stage 3 unspecified: Secondary | ICD-10-CM | POA: Diagnosis not present

## 2022-07-22 DIAGNOSIS — Z8711 Personal history of peptic ulcer disease: Secondary | ICD-10-CM | POA: Diagnosis not present

## 2022-07-22 DIAGNOSIS — W19XXXA Unspecified fall, initial encounter: Secondary | ICD-10-CM | POA: Diagnosis not present

## 2022-07-22 DIAGNOSIS — Z950 Presence of cardiac pacemaker: Secondary | ICD-10-CM | POA: Diagnosis not present

## 2022-07-22 DIAGNOSIS — Z8719 Personal history of other diseases of the digestive system: Secondary | ICD-10-CM | POA: Diagnosis not present

## 2022-07-22 DIAGNOSIS — R262 Difficulty in walking, not elsewhere classified: Secondary | ICD-10-CM | POA: Diagnosis not present

## 2022-07-22 DIAGNOSIS — Z792 Long term (current) use of antibiotics: Secondary | ICD-10-CM | POA: Diagnosis not present

## 2022-07-22 DIAGNOSIS — S32502A Unspecified fracture of left pubis, initial encounter for closed fracture: Secondary | ICD-10-CM | POA: Diagnosis not present

## 2022-07-22 DIAGNOSIS — E1122 Type 2 diabetes mellitus with diabetic chronic kidney disease: Secondary | ICD-10-CM | POA: Diagnosis not present

## 2022-07-22 DIAGNOSIS — Z794 Long term (current) use of insulin: Secondary | ICD-10-CM | POA: Diagnosis not present

## 2022-07-22 DIAGNOSIS — Z7901 Long term (current) use of anticoagulants: Secondary | ICD-10-CM | POA: Diagnosis not present

## 2022-07-22 DIAGNOSIS — N3001 Acute cystitis with hematuria: Secondary | ICD-10-CM | POA: Diagnosis not present

## 2022-07-22 DIAGNOSIS — G588 Other specified mononeuropathies: Secondary | ICD-10-CM | POA: Diagnosis not present

## 2022-07-22 DIAGNOSIS — E039 Hypothyroidism, unspecified: Secondary | ICD-10-CM | POA: Diagnosis not present

## 2022-07-22 NOTE — Assessment & Plan Note (Signed)
Continue with oxygen 2 L at night.

## 2022-07-22 NOTE — Assessment & Plan Note (Signed)
Conitnue wih CPAP with oxygen 2 L at night.

## 2022-07-23 ENCOUNTER — Other Ambulatory Visit: Payer: Self-pay | Admitting: Family Medicine

## 2022-07-23 DIAGNOSIS — E1159 Type 2 diabetes mellitus with other circulatory complications: Secondary | ICD-10-CM

## 2022-07-23 DIAGNOSIS — G4730 Sleep apnea, unspecified: Secondary | ICD-10-CM | POA: Diagnosis not present

## 2022-07-23 MED ORDER — GLIPIZIDE ER 10 MG PO TB24: 10.0000 mg | ORAL_TABLET | Freq: Every day | ORAL | 0 refills | Status: DC

## 2022-07-23 NOTE — Assessment & Plan Note (Signed)
Stable. Consider kerendia.

## 2022-07-24 ENCOUNTER — Other Ambulatory Visit: Payer: Self-pay

## 2022-07-24 DIAGNOSIS — G4730 Sleep apnea, unspecified: Secondary | ICD-10-CM | POA: Diagnosis not present

## 2022-07-24 DIAGNOSIS — E1159 Type 2 diabetes mellitus with other circulatory complications: Secondary | ICD-10-CM

## 2022-07-24 DIAGNOSIS — E039 Hypothyroidism, unspecified: Secondary | ICD-10-CM

## 2022-07-24 MED ORDER — LEVOTHYROXINE SODIUM 112 MCG PO TABS: 112.0000 ug | ORAL_TABLET | Freq: Every day | ORAL | 0 refills | Status: DC

## 2022-07-24 MED ORDER — KERENDIA 10 MG PO TABS: 10.0000 mg | ORAL_TABLET | Freq: Every day | ORAL | 2 refills | Status: DC

## 2022-07-24 MED ORDER — TOUJEO SOLOSTAR 300 UNIT/ML ~~LOC~~ SOPN: 24.0000 [IU] | PEN_INJECTOR | Freq: Every day | SUBCUTANEOUS | 1 refills | Status: DC

## 2022-07-25 DIAGNOSIS — G4730 Sleep apnea, unspecified: Secondary | ICD-10-CM | POA: Diagnosis not present

## 2022-07-26 DIAGNOSIS — G4730 Sleep apnea, unspecified: Secondary | ICD-10-CM | POA: Diagnosis not present

## 2022-07-26 DIAGNOSIS — G8929 Other chronic pain: Secondary | ICD-10-CM | POA: Diagnosis not present

## 2022-07-26 DIAGNOSIS — Z7401 Bed confinement status: Secondary | ICD-10-CM | POA: Diagnosis not present

## 2022-07-27 DIAGNOSIS — S32599D Other specified fracture of unspecified pubis, subsequent encounter for fracture with routine healing: Secondary | ICD-10-CM | POA: Diagnosis not present

## 2022-07-27 DIAGNOSIS — N390 Urinary tract infection, site not specified: Secondary | ICD-10-CM | POA: Diagnosis not present

## 2022-07-27 DIAGNOSIS — I480 Paroxysmal atrial fibrillation: Secondary | ICD-10-CM | POA: Diagnosis not present

## 2022-07-27 DIAGNOSIS — I10 Essential (primary) hypertension: Secondary | ICD-10-CM | POA: Diagnosis not present

## 2022-07-27 DIAGNOSIS — S32592D Other specified fracture of left pubis, subsequent encounter for fracture with routine healing: Secondary | ICD-10-CM | POA: Diagnosis not present

## 2022-07-27 DIAGNOSIS — R262 Difficulty in walking, not elsewhere classified: Secondary | ICD-10-CM | POA: Diagnosis not present

## 2022-07-27 DIAGNOSIS — R2689 Other abnormalities of gait and mobility: Secondary | ICD-10-CM | POA: Diagnosis not present

## 2022-07-27 DIAGNOSIS — W19XXXD Unspecified fall, subsequent encounter: Secondary | ICD-10-CM | POA: Diagnosis not present

## 2022-07-27 DIAGNOSIS — I4891 Unspecified atrial fibrillation: Secondary | ICD-10-CM | POA: Diagnosis not present

## 2022-07-27 DIAGNOSIS — M6281 Muscle weakness (generalized): Secondary | ICD-10-CM | POA: Diagnosis not present

## 2022-07-28 ENCOUNTER — Other Ambulatory Visit: Payer: Self-pay | Admitting: Family Medicine

## 2022-07-28 DIAGNOSIS — W19XXXD Unspecified fall, subsequent encounter: Secondary | ICD-10-CM | POA: Diagnosis not present

## 2022-07-28 DIAGNOSIS — M6281 Muscle weakness (generalized): Secondary | ICD-10-CM | POA: Diagnosis not present

## 2022-07-28 DIAGNOSIS — R2689 Other abnormalities of gait and mobility: Secondary | ICD-10-CM | POA: Diagnosis not present

## 2022-07-28 DIAGNOSIS — I10 Essential (primary) hypertension: Secondary | ICD-10-CM | POA: Diagnosis not present

## 2022-07-28 DIAGNOSIS — S32592D Other specified fracture of left pubis, subsequent encounter for fracture with routine healing: Secondary | ICD-10-CM | POA: Diagnosis not present

## 2022-07-28 DIAGNOSIS — I480 Paroxysmal atrial fibrillation: Secondary | ICD-10-CM | POA: Diagnosis not present

## 2022-07-29 DIAGNOSIS — I480 Paroxysmal atrial fibrillation: Secondary | ICD-10-CM | POA: Diagnosis not present

## 2022-07-29 DIAGNOSIS — S32592D Other specified fracture of left pubis, subsequent encounter for fracture with routine healing: Secondary | ICD-10-CM | POA: Diagnosis not present

## 2022-07-29 DIAGNOSIS — W19XXXD Unspecified fall, subsequent encounter: Secondary | ICD-10-CM | POA: Diagnosis not present

## 2022-07-29 DIAGNOSIS — M6281 Muscle weakness (generalized): Secondary | ICD-10-CM | POA: Diagnosis not present

## 2022-07-29 DIAGNOSIS — I10 Essential (primary) hypertension: Secondary | ICD-10-CM | POA: Diagnosis not present

## 2022-07-29 DIAGNOSIS — R2689 Other abnormalities of gait and mobility: Secondary | ICD-10-CM | POA: Diagnosis not present

## 2022-07-30 DIAGNOSIS — I10 Essential (primary) hypertension: Secondary | ICD-10-CM | POA: Diagnosis not present

## 2022-07-30 DIAGNOSIS — I480 Paroxysmal atrial fibrillation: Secondary | ICD-10-CM | POA: Diagnosis not present

## 2022-07-30 DIAGNOSIS — W19XXXD Unspecified fall, subsequent encounter: Secondary | ICD-10-CM | POA: Diagnosis not present

## 2022-07-30 DIAGNOSIS — S32592D Other specified fracture of left pubis, subsequent encounter for fracture with routine healing: Secondary | ICD-10-CM | POA: Diagnosis not present

## 2022-07-30 DIAGNOSIS — M6281 Muscle weakness (generalized): Secondary | ICD-10-CM | POA: Diagnosis not present

## 2022-07-30 DIAGNOSIS — R2689 Other abnormalities of gait and mobility: Secondary | ICD-10-CM | POA: Diagnosis not present

## 2022-07-31 DIAGNOSIS — I480 Paroxysmal atrial fibrillation: Secondary | ICD-10-CM | POA: Diagnosis not present

## 2022-07-31 DIAGNOSIS — I10 Essential (primary) hypertension: Secondary | ICD-10-CM | POA: Diagnosis not present

## 2022-07-31 DIAGNOSIS — S32592D Other specified fracture of left pubis, subsequent encounter for fracture with routine healing: Secondary | ICD-10-CM | POA: Diagnosis not present

## 2022-07-31 DIAGNOSIS — W19XXXD Unspecified fall, subsequent encounter: Secondary | ICD-10-CM | POA: Diagnosis not present

## 2022-07-31 DIAGNOSIS — R2689 Other abnormalities of gait and mobility: Secondary | ICD-10-CM | POA: Diagnosis not present

## 2022-07-31 DIAGNOSIS — M6281 Muscle weakness (generalized): Secondary | ICD-10-CM | POA: Diagnosis not present

## 2022-08-01 DIAGNOSIS — I480 Paroxysmal atrial fibrillation: Secondary | ICD-10-CM | POA: Diagnosis not present

## 2022-08-01 DIAGNOSIS — M6281 Muscle weakness (generalized): Secondary | ICD-10-CM | POA: Diagnosis not present

## 2022-08-01 DIAGNOSIS — R2689 Other abnormalities of gait and mobility: Secondary | ICD-10-CM | POA: Diagnosis not present

## 2022-08-01 DIAGNOSIS — I10 Essential (primary) hypertension: Secondary | ICD-10-CM | POA: Diagnosis not present

## 2022-08-01 DIAGNOSIS — S32592D Other specified fracture of left pubis, subsequent encounter for fracture with routine healing: Secondary | ICD-10-CM | POA: Diagnosis not present

## 2022-08-01 DIAGNOSIS — W19XXXD Unspecified fall, subsequent encounter: Secondary | ICD-10-CM | POA: Diagnosis not present

## 2022-08-02 DIAGNOSIS — I480 Paroxysmal atrial fibrillation: Secondary | ICD-10-CM | POA: Diagnosis not present

## 2022-08-02 DIAGNOSIS — R2689 Other abnormalities of gait and mobility: Secondary | ICD-10-CM | POA: Diagnosis not present

## 2022-08-02 DIAGNOSIS — M6281 Muscle weakness (generalized): Secondary | ICD-10-CM | POA: Diagnosis not present

## 2022-08-02 DIAGNOSIS — I10 Essential (primary) hypertension: Secondary | ICD-10-CM | POA: Diagnosis not present

## 2022-08-02 DIAGNOSIS — W19XXXD Unspecified fall, subsequent encounter: Secondary | ICD-10-CM | POA: Diagnosis not present

## 2022-08-02 DIAGNOSIS — S32592D Other specified fracture of left pubis, subsequent encounter for fracture with routine healing: Secondary | ICD-10-CM | POA: Diagnosis not present

## 2022-08-03 ENCOUNTER — Telehealth: Payer: Self-pay

## 2022-08-03 DIAGNOSIS — W19XXXD Unspecified fall, subsequent encounter: Secondary | ICD-10-CM | POA: Diagnosis not present

## 2022-08-03 DIAGNOSIS — I10 Essential (primary) hypertension: Secondary | ICD-10-CM | POA: Diagnosis not present

## 2022-08-03 DIAGNOSIS — I480 Paroxysmal atrial fibrillation: Secondary | ICD-10-CM | POA: Diagnosis not present

## 2022-08-03 DIAGNOSIS — R2689 Other abnormalities of gait and mobility: Secondary | ICD-10-CM | POA: Diagnosis not present

## 2022-08-03 DIAGNOSIS — M6281 Muscle weakness (generalized): Secondary | ICD-10-CM | POA: Diagnosis not present

## 2022-08-03 DIAGNOSIS — S32592D Other specified fracture of left pubis, subsequent encounter for fracture with routine healing: Secondary | ICD-10-CM | POA: Diagnosis not present

## 2022-08-03 NOTE — Telephone Encounter (Signed)
[employer] notice coverage determination.  [employer] denied dapagliflozin propanediol 10 mg tablet.

## 2022-08-04 DIAGNOSIS — I480 Paroxysmal atrial fibrillation: Secondary | ICD-10-CM | POA: Diagnosis not present

## 2022-08-04 DIAGNOSIS — S32592D Other specified fracture of left pubis, subsequent encounter for fracture with routine healing: Secondary | ICD-10-CM | POA: Diagnosis not present

## 2022-08-04 DIAGNOSIS — W19XXXD Unspecified fall, subsequent encounter: Secondary | ICD-10-CM | POA: Diagnosis not present

## 2022-08-04 DIAGNOSIS — I10 Essential (primary) hypertension: Secondary | ICD-10-CM | POA: Diagnosis not present

## 2022-08-04 DIAGNOSIS — R2689 Other abnormalities of gait and mobility: Secondary | ICD-10-CM | POA: Diagnosis not present

## 2022-08-04 DIAGNOSIS — M6281 Muscle weakness (generalized): Secondary | ICD-10-CM | POA: Diagnosis not present

## 2022-08-05 DIAGNOSIS — S32592D Other specified fracture of left pubis, subsequent encounter for fracture with routine healing: Secondary | ICD-10-CM | POA: Diagnosis not present

## 2022-08-05 DIAGNOSIS — M6281 Muscle weakness (generalized): Secondary | ICD-10-CM | POA: Diagnosis not present

## 2022-08-05 DIAGNOSIS — R2689 Other abnormalities of gait and mobility: Secondary | ICD-10-CM | POA: Diagnosis not present

## 2022-08-05 DIAGNOSIS — I10 Essential (primary) hypertension: Secondary | ICD-10-CM | POA: Diagnosis not present

## 2022-08-05 DIAGNOSIS — W19XXXD Unspecified fall, subsequent encounter: Secondary | ICD-10-CM | POA: Diagnosis not present

## 2022-08-05 DIAGNOSIS — I480 Paroxysmal atrial fibrillation: Secondary | ICD-10-CM | POA: Diagnosis not present

## 2022-08-06 DIAGNOSIS — M6281 Muscle weakness (generalized): Secondary | ICD-10-CM | POA: Diagnosis not present

## 2022-08-06 DIAGNOSIS — R2689 Other abnormalities of gait and mobility: Secondary | ICD-10-CM | POA: Diagnosis not present

## 2022-08-06 DIAGNOSIS — W19XXXD Unspecified fall, subsequent encounter: Secondary | ICD-10-CM | POA: Diagnosis not present

## 2022-08-06 DIAGNOSIS — S32592D Other specified fracture of left pubis, subsequent encounter for fracture with routine healing: Secondary | ICD-10-CM | POA: Diagnosis not present

## 2022-08-06 DIAGNOSIS — I480 Paroxysmal atrial fibrillation: Secondary | ICD-10-CM | POA: Diagnosis not present

## 2022-08-06 DIAGNOSIS — I10 Essential (primary) hypertension: Secondary | ICD-10-CM | POA: Diagnosis not present

## 2022-08-07 ENCOUNTER — Telehealth: Payer: Self-pay

## 2022-08-07 DIAGNOSIS — S32592D Other specified fracture of left pubis, subsequent encounter for fracture with routine healing: Secondary | ICD-10-CM | POA: Diagnosis not present

## 2022-08-07 DIAGNOSIS — I480 Paroxysmal atrial fibrillation: Secondary | ICD-10-CM | POA: Diagnosis not present

## 2022-08-07 DIAGNOSIS — M6281 Muscle weakness (generalized): Secondary | ICD-10-CM | POA: Diagnosis not present

## 2022-08-07 DIAGNOSIS — I10 Essential (primary) hypertension: Secondary | ICD-10-CM | POA: Diagnosis not present

## 2022-08-07 DIAGNOSIS — W19XXXD Unspecified fall, subsequent encounter: Secondary | ICD-10-CM | POA: Diagnosis not present

## 2022-08-07 DIAGNOSIS — R2689 Other abnormalities of gait and mobility: Secondary | ICD-10-CM | POA: Diagnosis not present

## 2022-08-07 NOTE — Telephone Encounter (Signed)
Dr. Megan Salon called to make [place] aware that his Dad had a fall about 2 weeks ago fracturing his pelvis.  He is currently in [facility] for rehab.  He is concerned that he will need a wheelchair and grab bars when he gets home.  Dr. Tobie Poet was informed.

## 2022-08-07 NOTE — Telephone Encounter (Signed)
The patient is scheduled for an HM follow-up on Aug 05, 2022 The HM follow-up transmission has not arrived yet. The time of arrival depends on the HM transmission time of the implanted device.  Routing to [place] team to follow up:  Can you check with patient and see why he isn't transmitting?  Thanks.

## 2022-08-08 DIAGNOSIS — W19XXXD Unspecified fall, subsequent encounter: Secondary | ICD-10-CM | POA: Diagnosis not present

## 2022-08-08 DIAGNOSIS — I480 Paroxysmal atrial fibrillation: Secondary | ICD-10-CM | POA: Diagnosis not present

## 2022-08-08 DIAGNOSIS — M6281 Muscle weakness (generalized): Secondary | ICD-10-CM | POA: Diagnosis not present

## 2022-08-08 DIAGNOSIS — S32592D Other specified fracture of left pubis, subsequent encounter for fracture with routine healing: Secondary | ICD-10-CM | POA: Diagnosis not present

## 2022-08-08 DIAGNOSIS — I10 Essential (primary) hypertension: Secondary | ICD-10-CM | POA: Diagnosis not present

## 2022-08-08 DIAGNOSIS — R2689 Other abnormalities of gait and mobility: Secondary | ICD-10-CM | POA: Diagnosis not present

## 2022-08-09 DIAGNOSIS — M6281 Muscle weakness (generalized): Secondary | ICD-10-CM | POA: Diagnosis not present

## 2022-08-09 DIAGNOSIS — I480 Paroxysmal atrial fibrillation: Secondary | ICD-10-CM | POA: Diagnosis not present

## 2022-08-09 DIAGNOSIS — W19XXXD Unspecified fall, subsequent encounter: Secondary | ICD-10-CM | POA: Diagnosis not present

## 2022-08-09 DIAGNOSIS — R2689 Other abnormalities of gait and mobility: Secondary | ICD-10-CM | POA: Diagnosis not present

## 2022-08-09 DIAGNOSIS — I10 Essential (primary) hypertension: Secondary | ICD-10-CM | POA: Diagnosis not present

## 2022-08-09 DIAGNOSIS — S32592D Other specified fracture of left pubis, subsequent encounter for fracture with routine healing: Secondary | ICD-10-CM | POA: Diagnosis not present

## 2022-08-10 DIAGNOSIS — M6281 Muscle weakness (generalized): Secondary | ICD-10-CM | POA: Diagnosis not present

## 2022-08-10 DIAGNOSIS — R2689 Other abnormalities of gait and mobility: Secondary | ICD-10-CM | POA: Diagnosis not present

## 2022-08-10 DIAGNOSIS — I480 Paroxysmal atrial fibrillation: Secondary | ICD-10-CM | POA: Diagnosis not present

## 2022-08-10 DIAGNOSIS — S32592D Other specified fracture of left pubis, subsequent encounter for fracture with routine healing: Secondary | ICD-10-CM | POA: Diagnosis not present

## 2022-08-10 DIAGNOSIS — W19XXXD Unspecified fall, subsequent encounter: Secondary | ICD-10-CM | POA: Diagnosis not present

## 2022-08-10 DIAGNOSIS — I10 Essential (primary) hypertension: Secondary | ICD-10-CM | POA: Diagnosis not present

## 2022-08-11 DIAGNOSIS — R2689 Other abnormalities of gait and mobility: Secondary | ICD-10-CM | POA: Diagnosis not present

## 2022-08-11 DIAGNOSIS — M6281 Muscle weakness (generalized): Secondary | ICD-10-CM | POA: Diagnosis not present

## 2022-08-11 DIAGNOSIS — I480 Paroxysmal atrial fibrillation: Secondary | ICD-10-CM | POA: Diagnosis not present

## 2022-08-11 DIAGNOSIS — S32592D Other specified fracture of left pubis, subsequent encounter for fracture with routine healing: Secondary | ICD-10-CM | POA: Diagnosis not present

## 2022-08-11 DIAGNOSIS — W19XXXD Unspecified fall, subsequent encounter: Secondary | ICD-10-CM | POA: Diagnosis not present

## 2022-08-11 DIAGNOSIS — I10 Essential (primary) hypertension: Secondary | ICD-10-CM | POA: Diagnosis not present

## 2022-08-14 ENCOUNTER — Telehealth: Payer: Self-pay

## 2022-08-14 DIAGNOSIS — G4733 Obstructive sleep apnea (adult) (pediatric): Secondary | ICD-10-CM | POA: Diagnosis not present

## 2022-08-14 DIAGNOSIS — E291 Testicular hypofunction: Secondary | ICD-10-CM | POA: Diagnosis not present

## 2022-08-14 DIAGNOSIS — M800AXD Age-related osteoporosis with current pathological fracture, other site, subsequent encounter for fracture with routine healing: Secondary | ICD-10-CM | POA: Diagnosis not present

## 2022-08-14 DIAGNOSIS — E1142 Type 2 diabetes mellitus with diabetic polyneuropathy: Secondary | ICD-10-CM | POA: Diagnosis not present

## 2022-08-14 DIAGNOSIS — E1151 Type 2 diabetes mellitus with diabetic peripheral angiopathy without gangrene: Secondary | ICD-10-CM | POA: Diagnosis not present

## 2022-08-14 DIAGNOSIS — I495 Sick sinus syndrome: Secondary | ICD-10-CM | POA: Diagnosis not present

## 2022-08-14 DIAGNOSIS — K279 Peptic ulcer, site unspecified, unspecified as acute or chronic, without hemorrhage or perforation: Secondary | ICD-10-CM | POA: Diagnosis not present

## 2022-08-14 DIAGNOSIS — N529 Male erectile dysfunction, unspecified: Secondary | ICD-10-CM | POA: Diagnosis not present

## 2022-08-14 DIAGNOSIS — I130 Hypertensive heart and chronic kidney disease with heart failure and stage 1 through stage 4 chronic kidney disease, or unspecified chronic kidney disease: Secondary | ICD-10-CM | POA: Diagnosis not present

## 2022-08-14 DIAGNOSIS — G250 Essential tremor: Secondary | ICD-10-CM | POA: Diagnosis not present

## 2022-08-14 DIAGNOSIS — N401 Enlarged prostate with lower urinary tract symptoms: Secondary | ICD-10-CM | POA: Diagnosis not present

## 2022-08-14 DIAGNOSIS — B9620 Unspecified Escherichia coli [E. coli] as the cause of diseases classified elsewhere: Secondary | ICD-10-CM | POA: Diagnosis not present

## 2022-08-14 DIAGNOSIS — N3000 Acute cystitis without hematuria: Secondary | ICD-10-CM | POA: Diagnosis not present

## 2022-08-14 DIAGNOSIS — N1832 Chronic kidney disease, stage 3b: Secondary | ICD-10-CM | POA: Diagnosis not present

## 2022-08-14 DIAGNOSIS — I4819 Other persistent atrial fibrillation: Secondary | ICD-10-CM | POA: Diagnosis not present

## 2022-08-14 DIAGNOSIS — I5089 Other heart failure: Secondary | ICD-10-CM | POA: Diagnosis not present

## 2022-08-14 DIAGNOSIS — J342 Deviated nasal septum: Secondary | ICD-10-CM | POA: Diagnosis not present

## 2022-08-14 DIAGNOSIS — E039 Hypothyroidism, unspecified: Secondary | ICD-10-CM | POA: Diagnosis not present

## 2022-08-14 DIAGNOSIS — L84 Corns and callosities: Secondary | ICD-10-CM | POA: Diagnosis not present

## 2022-08-14 DIAGNOSIS — J9611 Chronic respiratory failure with hypoxia: Secondary | ICD-10-CM | POA: Diagnosis not present

## 2022-08-14 DIAGNOSIS — D6859 Other primary thrombophilia: Secondary | ICD-10-CM | POA: Diagnosis not present

## 2022-08-14 DIAGNOSIS — N2581 Secondary hyperparathyroidism of renal origin: Secondary | ICD-10-CM | POA: Diagnosis not present

## 2022-08-14 DIAGNOSIS — N138 Other obstructive and reflux uropathy: Secondary | ICD-10-CM | POA: Diagnosis not present

## 2022-08-14 DIAGNOSIS — E1122 Type 2 diabetes mellitus with diabetic chronic kidney disease: Secondary | ICD-10-CM | POA: Diagnosis not present

## 2022-08-14 DIAGNOSIS — K219 Gastro-esophageal reflux disease without esophagitis: Secondary | ICD-10-CM | POA: Diagnosis not present

## 2022-08-14 NOTE — Transitions of Care (Post Inpatient/ED Visit) (Unsigned)
   08/14/2022  Name: Paul Kent MRN: [id number] DOB: 1941/04/29  Today's TOC FU Call Status: Today's TOC FU Call Status:: Unsuccessul Call (1st Attempt) Unsuccessful Call (1st Attempt) Date: 08/14/22  Attempted to reach the patient regarding the most recent Inpatient/ED visit.  Follow Up Plan: Additional outreach attempts will be made to reach the patient to complete the Transitions of Care (Post Inpatient/ED visit) call.   Signature Juanda Crumble, [place] Direct Dial [phone]

## 2022-08-15 ENCOUNTER — Other Ambulatory Visit: Payer: Self-pay

## 2022-08-15 MED ORDER — FUROSEMIDE 40 MG PO TABS: 40.0000 mg | ORAL_TABLET | Freq: Every day | ORAL | 1 refills | Status: DC

## 2022-08-15 NOTE — Transitions of Care (Post Inpatient/ED Visit) (Signed)
   08/15/2022  Name: Paul Kent MRN: [id number] DOB: [birth year]  Today's TOC FU Call Status: Today's TOC FU Call Status:: Successful TOC FU Call Competed Unsuccessful Call (1st Attempt) Date: 08/14/22 [facility] FU Call Complete Date: 08/15/22  Transition Care Management Follow-up Telephone Call Date of Discharge: 08/12/22 Discharge Facility: Other ([place]) Name of Other (Non-[facility]) Discharge Facility: [facility] Type of Discharge: Inpatient Admission Primary Inpatient Discharge Diagnosis:: fracture pubis How have you been since you were released from the hospital?: Better Any questions or concerns?: No  Items Reviewed: Did you receive and understand the discharge instructions provided?: Yes Medications obtained and verified?: Yes (Medications Reviewed) Any new allergies since your discharge?: No Dietary orders reviewed?: NA Do you have support at home?: Yes People in Home: spouse  Home Care and Equipment/Supplies: [place] Ordered?: Yes Name of [place]:: [place] set up a time to come to your home?: Yes [place] Visit Date: 08/14/22 Any new equipment or medical supplies ordered?: Yes Name of Medical supply agency?: [facility] Were you able to get the equipment/medical supplies?: No Do you have any questions related to the use of the equipment/supplies?: No  Functional Questionnaire: Do you need assistance with bathing/showering or dressing?: No Do you need assistance with meal preparation?: No Do you need assistance with eating?: No Do you have difficulty maintaining continence: No Do you need assistance with getting out of bed/getting out of a chair/moving?: No Do you have difficulty managing or taking your medications?: No  Folllow up appointments reviewed: PCP Follow-up appointment confirmed?: NA [facility] Follow-up appointment confirmed?: NA Do you need transportation to your follow-up  appointment?: No Do you understand care options if your condition(s) worsen?: Yes-patient verbalized understanding    [place], [place] Direct Dial [phone]

## 2022-08-17 DIAGNOSIS — Z7984 Long term (current) use of oral hypoglycemic drugs: Secondary | ICD-10-CM | POA: Diagnosis not present

## 2022-08-17 DIAGNOSIS — Z79899 Other long term (current) drug therapy: Secondary | ICD-10-CM | POA: Diagnosis not present

## 2022-08-17 DIAGNOSIS — N183 Chronic kidney disease, stage 3 unspecified: Secondary | ICD-10-CM | POA: Diagnosis not present

## 2022-08-17 DIAGNOSIS — M800AXD Age-related osteoporosis with current pathological fracture, other site, subsequent encounter for fracture with routine healing: Secondary | ICD-10-CM | POA: Diagnosis not present

## 2022-08-17 DIAGNOSIS — R410 Disorientation, unspecified: Secondary | ICD-10-CM | POA: Diagnosis not present

## 2022-08-17 DIAGNOSIS — Z8711 Personal history of peptic ulcer disease: Secondary | ICD-10-CM | POA: Diagnosis not present

## 2022-08-17 DIAGNOSIS — M9702XA Periprosthetic fracture around internal prosthetic left hip joint, initial encounter: Secondary | ICD-10-CM | POA: Diagnosis not present

## 2022-08-17 DIAGNOSIS — I440 Atrioventricular block, first degree: Secondary | ICD-10-CM | POA: Diagnosis not present

## 2022-08-17 DIAGNOSIS — I130 Hypertensive heart and chronic kidney disease with heart failure and stage 1 through stage 4 chronic kidney disease, or unspecified chronic kidney disease: Secondary | ICD-10-CM | POA: Diagnosis not present

## 2022-08-17 DIAGNOSIS — N390 Urinary tract infection, site not specified: Secondary | ICD-10-CM | POA: Diagnosis not present

## 2022-08-17 DIAGNOSIS — B9689 Other specified bacterial agents as the cause of diseases classified elsewhere: Secondary | ICD-10-CM | POA: Diagnosis not present

## 2022-08-17 DIAGNOSIS — E119 Type 2 diabetes mellitus without complications: Secondary | ICD-10-CM | POA: Diagnosis not present

## 2022-08-17 DIAGNOSIS — I4891 Unspecified atrial fibrillation: Secondary | ICD-10-CM | POA: Diagnosis not present

## 2022-08-17 DIAGNOSIS — N200 Calculus of kidney: Secondary | ICD-10-CM | POA: Diagnosis not present

## 2022-08-17 DIAGNOSIS — I509 Heart failure, unspecified: Secondary | ICD-10-CM | POA: Diagnosis not present

## 2022-08-17 DIAGNOSIS — R9431 Abnormal electrocardiogram [ECG] [EKG]: Secondary | ICD-10-CM | POA: Diagnosis not present

## 2022-08-17 DIAGNOSIS — R4182 Altered mental status, unspecified: Secondary | ICD-10-CM | POA: Diagnosis not present

## 2022-08-17 DIAGNOSIS — R739 Hyperglycemia, unspecified: Secondary | ICD-10-CM | POA: Diagnosis not present

## 2022-08-17 DIAGNOSIS — N2581 Secondary hyperparathyroidism of renal origin: Secondary | ICD-10-CM | POA: Diagnosis not present

## 2022-08-17 DIAGNOSIS — Z87442 Personal history of urinary calculi: Secondary | ICD-10-CM | POA: Diagnosis not present

## 2022-08-17 DIAGNOSIS — E1122 Type 2 diabetes mellitus with diabetic chronic kidney disease: Secondary | ICD-10-CM | POA: Diagnosis not present

## 2022-08-17 DIAGNOSIS — Z950 Presence of cardiac pacemaker: Secondary | ICD-10-CM | POA: Diagnosis not present

## 2022-08-17 DIAGNOSIS — Z792 Long term (current) use of antibiotics: Secondary | ICD-10-CM | POA: Diagnosis not present

## 2022-08-17 DIAGNOSIS — N3001 Acute cystitis with hematuria: Secondary | ICD-10-CM | POA: Diagnosis not present

## 2022-08-17 DIAGNOSIS — R531 Weakness: Secondary | ICD-10-CM | POA: Diagnosis not present

## 2022-08-17 DIAGNOSIS — K8020 Calculus of gallbladder without cholecystitis without obstruction: Secondary | ICD-10-CM | POA: Diagnosis not present

## 2022-08-17 DIAGNOSIS — I959 Hypotension, unspecified: Secondary | ICD-10-CM | POA: Diagnosis not present

## 2022-08-17 DIAGNOSIS — F05 Delirium due to known physiological condition: Secondary | ICD-10-CM | POA: Diagnosis not present

## 2022-08-17 DIAGNOSIS — N1832 Chronic kidney disease, stage 3b: Secondary | ICD-10-CM | POA: Diagnosis not present

## 2022-08-17 DIAGNOSIS — Z7901 Long term (current) use of anticoagulants: Secondary | ICD-10-CM | POA: Diagnosis not present

## 2022-08-17 DIAGNOSIS — S32592A Other specified fracture of left pubis, initial encounter for closed fracture: Secondary | ICD-10-CM | POA: Diagnosis not present

## 2022-08-17 DIAGNOSIS — E039 Hypothyroidism, unspecified: Secondary | ICD-10-CM | POA: Diagnosis not present

## 2022-08-17 DIAGNOSIS — Z794 Long term (current) use of insulin: Secondary | ICD-10-CM | POA: Diagnosis not present

## 2022-08-17 DIAGNOSIS — I5089 Other heart failure: Secondary | ICD-10-CM | POA: Diagnosis not present

## 2022-08-18 DIAGNOSIS — N390 Urinary tract infection, site not specified: Secondary | ICD-10-CM | POA: Diagnosis not present

## 2022-08-18 DIAGNOSIS — I440 Atrioventricular block, first degree: Secondary | ICD-10-CM | POA: Diagnosis not present

## 2022-08-18 DIAGNOSIS — E119 Type 2 diabetes mellitus without complications: Secondary | ICD-10-CM | POA: Diagnosis not present

## 2022-08-18 DIAGNOSIS — N183 Chronic kidney disease, stage 3 unspecified: Secondary | ICD-10-CM | POA: Diagnosis not present

## 2022-08-18 DIAGNOSIS — Z794 Long term (current) use of insulin: Secondary | ICD-10-CM | POA: Diagnosis not present

## 2022-08-18 DIAGNOSIS — M9702XA Periprosthetic fracture around internal prosthetic left hip joint, initial encounter: Secondary | ICD-10-CM | POA: Diagnosis not present

## 2022-08-18 DIAGNOSIS — S32592A Other specified fracture of left pubis, initial encounter for closed fracture: Secondary | ICD-10-CM | POA: Diagnosis not present

## 2022-08-18 DIAGNOSIS — Z792 Long term (current) use of antibiotics: Secondary | ICD-10-CM | POA: Diagnosis not present

## 2022-08-18 DIAGNOSIS — R4182 Altered mental status, unspecified: Secondary | ICD-10-CM | POA: Diagnosis not present

## 2022-08-18 DIAGNOSIS — F05 Delirium due to known physiological condition: Secondary | ICD-10-CM | POA: Diagnosis not present

## 2022-08-18 DIAGNOSIS — I4891 Unspecified atrial fibrillation: Secondary | ICD-10-CM | POA: Diagnosis not present

## 2022-08-18 DIAGNOSIS — Z87442 Personal history of urinary calculi: Secondary | ICD-10-CM | POA: Diagnosis not present

## 2022-08-18 DIAGNOSIS — Z8711 Personal history of peptic ulcer disease: Secondary | ICD-10-CM | POA: Diagnosis not present

## 2022-08-18 DIAGNOSIS — B9689 Other specified bacterial agents as the cause of diseases classified elsewhere: Secondary | ICD-10-CM | POA: Diagnosis not present

## 2022-08-18 DIAGNOSIS — N3001 Acute cystitis with hematuria: Secondary | ICD-10-CM | POA: Diagnosis not present

## 2022-08-18 DIAGNOSIS — Z7901 Long term (current) use of anticoagulants: Secondary | ICD-10-CM | POA: Diagnosis not present

## 2022-08-18 DIAGNOSIS — E1122 Type 2 diabetes mellitus with diabetic chronic kidney disease: Secondary | ICD-10-CM | POA: Diagnosis present

## 2022-08-18 DIAGNOSIS — R9431 Abnormal electrocardiogram [ECG] [EKG]: Secondary | ICD-10-CM | POA: Diagnosis not present

## 2022-08-18 DIAGNOSIS — Z950 Presence of cardiac pacemaker: Secondary | ICD-10-CM | POA: Diagnosis not present

## 2022-08-18 DIAGNOSIS — Z79899 Other long term (current) drug therapy: Secondary | ICD-10-CM | POA: Diagnosis not present

## 2022-08-18 DIAGNOSIS — Z7984 Long term (current) use of oral hypoglycemic drugs: Secondary | ICD-10-CM | POA: Diagnosis not present

## 2022-08-18 DIAGNOSIS — R410 Disorientation, unspecified: Secondary | ICD-10-CM | POA: Diagnosis not present

## 2022-08-18 DIAGNOSIS — I130 Hypertensive heart and chronic kidney disease with heart failure and stage 1 through stage 4 chronic kidney disease, or unspecified chronic kidney disease: Secondary | ICD-10-CM | POA: Diagnosis not present

## 2022-08-18 DIAGNOSIS — R531 Weakness: Secondary | ICD-10-CM | POA: Diagnosis present

## 2022-08-18 DIAGNOSIS — K8020 Calculus of gallbladder without cholecystitis without obstruction: Secondary | ICD-10-CM | POA: Diagnosis not present

## 2022-08-18 DIAGNOSIS — E039 Hypothyroidism, unspecified: Secondary | ICD-10-CM | POA: Diagnosis not present

## 2022-08-18 DIAGNOSIS — N200 Calculus of kidney: Secondary | ICD-10-CM | POA: Diagnosis not present

## 2022-08-18 DIAGNOSIS — I509 Heart failure, unspecified: Secondary | ICD-10-CM | POA: Diagnosis present

## 2022-08-21 DIAGNOSIS — E1122 Type 2 diabetes mellitus with diabetic chronic kidney disease: Secondary | ICD-10-CM | POA: Diagnosis not present

## 2022-08-21 DIAGNOSIS — I130 Hypertensive heart and chronic kidney disease with heart failure and stage 1 through stage 4 chronic kidney disease, or unspecified chronic kidney disease: Secondary | ICD-10-CM | POA: Diagnosis not present

## 2022-08-21 DIAGNOSIS — N2581 Secondary hyperparathyroidism of renal origin: Secondary | ICD-10-CM | POA: Diagnosis not present

## 2022-08-21 DIAGNOSIS — M800AXD Age-related osteoporosis with current pathological fracture, other site, subsequent encounter for fracture with routine healing: Secondary | ICD-10-CM | POA: Diagnosis not present

## 2022-08-21 DIAGNOSIS — N1832 Chronic kidney disease, stage 3b: Secondary | ICD-10-CM | POA: Diagnosis not present

## 2022-08-21 DIAGNOSIS — I5089 Other heart failure: Secondary | ICD-10-CM | POA: Diagnosis not present

## 2022-08-22 DIAGNOSIS — I5089 Other heart failure: Secondary | ICD-10-CM | POA: Diagnosis not present

## 2022-08-22 DIAGNOSIS — I130 Hypertensive heart and chronic kidney disease with heart failure and stage 1 through stage 4 chronic kidney disease, or unspecified chronic kidney disease: Secondary | ICD-10-CM | POA: Diagnosis not present

## 2022-08-22 DIAGNOSIS — N1832 Chronic kidney disease, stage 3b: Secondary | ICD-10-CM | POA: Diagnosis not present

## 2022-08-22 DIAGNOSIS — M800AXD Age-related osteoporosis with current pathological fracture, other site, subsequent encounter for fracture with routine healing: Secondary | ICD-10-CM | POA: Diagnosis not present

## 2022-08-22 DIAGNOSIS — N2581 Secondary hyperparathyroidism of renal origin: Secondary | ICD-10-CM | POA: Diagnosis not present

## 2022-08-22 DIAGNOSIS — E1122 Type 2 diabetes mellitus with diabetic chronic kidney disease: Secondary | ICD-10-CM | POA: Diagnosis not present

## 2022-08-23 ENCOUNTER — Ambulatory Visit (INDEPENDENT_AMBULATORY_CARE_PROVIDER_SITE_OTHER): Payer: Medicare Other | Admitting: Family Medicine

## 2022-08-23 VITALS — BP 114/64 | HR 88 | Temp 97.3°F | Ht 75.0 in | Wt 240.0 lb

## 2022-08-23 DIAGNOSIS — I130 Hypertensive heart and chronic kidney disease with heart failure and stage 1 through stage 4 chronic kidney disease, or unspecified chronic kidney disease: Secondary | ICD-10-CM

## 2022-08-23 DIAGNOSIS — N3000 Acute cystitis without hematuria: Secondary | ICD-10-CM | POA: Diagnosis not present

## 2022-08-23 DIAGNOSIS — E039 Hypothyroidism, unspecified: Secondary | ICD-10-CM

## 2022-08-23 DIAGNOSIS — N1832 Chronic kidney disease, stage 3b: Secondary | ICD-10-CM

## 2022-08-23 LAB — POCT URINALYSIS DIP (CLINITEK)
Bilirubin, UA: NEGATIVE
Glucose, UA: 500 mg/dL — AB
Ketones, POC UA: NEGATIVE mg/dL
Nitrite, UA: NEGATIVE
POC PROTEIN,UA: NEGATIVE
Spec Grav, UA: 1.015 (ref 1.010–1.025)
Urobilinogen, UA: 0.2 E.U./dL
pH, UA: 5 (ref 5.0–8.0)

## 2022-08-23 MED ORDER — CIPROFLOXACIN HCL 250 MG PO TABS: 250.0000 mg | ORAL_TABLET | Freq: Two times a day (BID) | ORAL | 0 refills | Status: AC

## 2022-08-23 NOTE — Progress Notes (Signed)
Subjective:  Patient ID: Paul Kent, male    DOB: 05-19-1941  Age: 82 y.o. MRN: [id number]  Chief Complaint  Patient presents with   Hospital F/u    HPI Patient presents today for hospital follow up at [facility] on 08/15/2022 and discharged 08/16/2022. Dx with UTI w/hematuria and treated with rocephin while in the hospital and discharged with cefdinir 300 mg capsules BID.  Patient also presents for follow up from being discharged from [facility] due to fall causing him to fracture his pelvis. No surgery needed.  States he has PT coming to his home twice a week. Improving.  Diabetes: Sugars 120-140s. On toujeo 24 U daily, glipizide xl 10 mg daily, kerendia 10 mg daily, farxiga 10 mg daily.     02/13/2022    9:10 AM 11/24/2021    9:00 AM 04/06/2021   10:56 AM 01/20/2021    8:51 AM 11/16/2020    9:21 AM  Depression screen PHQ 2/9  Decreased Interest 0 0 0 0 0  Down, Depressed, Hopeless 0 0 0 0 0  PHQ - 2 Score 0 0 0 0 0         05/30/2021   11:16 AM 07/29/2021    8:27 AM 11/24/2021    9:06 AM 02/13/2022    9:10 AM 07/19/2022    8:23 AM  Fall Risk  Falls in the past year? 0 0 0 1 1  Was there an injury with Fall? 0 0 0 1 0  Fall Risk Category Calculator 0 0 0 2 1  Fall Risk Category (Retired) Low Low Low Moderate   (RETIRED) Patient Fall Risk Level Moderate fall risk  Moderate fall risk Moderate fall risk   Patient at Risk for Falls Due to Impaired balance/gait  Impaired balance/gait Impaired mobility Impaired balance/gait  Fall risk Follow up Falls evaluation completed Falls evaluation completed Falls evaluation completed;Education provided Falls evaluation completed Falls evaluation completed;Education provided      Review of Systems  Constitutional:  Positive for fatigue. Negative for chills and fever.  HENT:  Negative for congestion, ear pain, postnasal drip, rhinorrhea, sinus pressure, sinus pain and sore throat.   Respiratory:  Negative for cough and shortness of breath.    Cardiovascular:  Negative for chest pain.  Gastrointestinal:  Negative for abdominal pain, constipation, diarrhea, nausea and vomiting.  Endocrine: Positive for polyuria.  Genitourinary:  Positive for frequency. Negative for dysuria and urgency.  Neurological:  Negative for dizziness and headaches.    Current Outpatient Medications on File Prior to Visit  Medication Sig Dispense Refill   cefdinir (OMNICEF) 300 MG capsule Take 300 mg by mouth 2 (two) times daily.     amiodarone (PACERONE) 200 MG tablet TAKE ONE TABLET BY MOUTH EVERY DAY 90 tablet 1   atorvastatin (LIPITOR) 10 MG tablet TAKE ONE TABLET BY MOUTH EVERY EVENING 90 tablet 0   B-D UF III MINI PEN NEEDLES 31G X 5 MM MISC USE AS DIRECTED EVERY DAY to inject insulin 100 each 1   carvedilol (COREG) 12.5 MG tablet Take 1 tablet (12.5 mg total) by mouth 2 (two) times daily. 180 tablet 3   Continuous Blood Gluc Sensor (DEXCOM G7 SENSOR) MISC To use as directed to monitor glucose 3 each 3   dapagliflozin propanediol (FARXIGA) 10 MG TABS tablet TAKE ONE TABLET BY MOUTH EVERY DAY 90 tablet 0   famotidine (PEPCID) 40 MG tablet Take 1 tablet (40 mg total) by mouth daily. 90 tablet 0   Finerenone (  KERENDIA) 10 MG TABS Take 1 tablet (10 mg total) by mouth daily. 30 tablet 2   furosemide (LASIX) 40 MG tablet Take 1 tablet (40 mg total) by mouth daily. 30 tablet 1   gabapentin (NEURONTIN) 300 MG capsule Take 1 capsule (300 mg total) by mouth 2 (two) times daily. 180 capsule 1   glipiZIDE (GLUCOTROL XL) 10 MG 24 hr tablet Take 1 tablet (10 mg total) by mouth daily. 1 tablet 0   insulin glargine, 1 Unit Dial, (TOUJEO SOLOSTAR) 300 UNIT/ML Solostar Pen Inject 24 Units into the skin daily at 6 (six) AM. 4.5 mL 1   levothyroxine (SYNTHROID) 112 MCG tablet Take 1 tablet (112 mcg total) by mouth daily. 60 tablet 0   tamsulosin (FLOMAX) 0.4 MG CAPS capsule Take 1 capsule (0.4 mg total) by mouth daily after supper. 30 capsule 1   testosterone cypionate  (DEPOTESTOSTERONE CYPIONATE) 200 MG/ML injection INJECT '200MG'$  (1 MILLILITER) into the muscle EVERY 14 DAYS 10 mL 0   XARELTO 20 MG TABS tablet TAKE ONE TABLET BY MOUTH EVERY EVENING 90 tablet 1   No current facility-administered medications on file prior to visit.   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 ([place]) 04/26/2021   Corns and callosities    Deviated septum 07/01/2018   Deviated septum 07/01/2018   Diabetes mellitus due to underlying condition with unspecified complications ([place]) [id number]   Diabetic ulcer of right heel associated with diabetes mellitus due to underlying condition, limited to breakdown of skin ([place]) 07/31/2021   Disturbances of salivary secretion    Epistaxis    Essential hypertension    Essential tremor    H pylori ulcer    H pylori ulcer    Herpes zoster with nervous system complication [id number]   Hesitancy of micturition    Hyperlipidemia    Hypothyroidism    Impacted cerumen, bilateral    Jaw pain    Lesion of femoral nerve 04/30/2013   Localized edema    Male erectile disorder    Malignant neoplasm of overlapping sites of bladder ([place])    Nasal congestion 07/01/2018   Nasal turbinate hypertrophy 07/01/2018   Obstructive sleep apnea 01/17/2017   Other constipation    Other fatigue    Overweight    Pacemaker reprogramming/check 02/12/2017   Pain in right finger(s)    Pain in right lower leg    Paroxysmal atrial fibrillation ([place]) 06/08/2016   Peptic ulcer disease    Persistent atrial fibrillation ([place]) 06/01/2017   Primary insomnia    Renal  stones    Renal stones    Secondary hypercoagulable state ([place]) 06/17/2020   Shingles [id number]   WITH COMPLICATIONS (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)   Shingles 06/2012   Shortness of breath    Sick sinus syndrome (HCC)    Sinus bradycardia 03/07/2017   Sleep apnea    dx 08/2015, CPAP   Spontaneous ecchymoses    Status post ablation of atrial fibrillation 09/16/2020   Testicular hypofunction  Thoracic or lumbosacral neuritis or radiculitis 06/03/2013   Trigger middle finger of right hand 11/28/2019   Trigger middle finger of right hand 11/28/2019   Type 2 diabetes mellitus with circulatory disorder, 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[place]) 01/17/2017   Past Surgical History:  Procedure Laterality Date   ABLATION  06/2017   ATRIAL FIBRILLATION ABLATION N/A 05/21/2020   Procedure: ATRIAL FIBRILLATION ABLATION;  Surgeon: Constance Haw, MD;  Location: [place] [facility];  Service: Cardiovascular;  Laterality: N/A;   CARDIOVERSION N/A 03/22/2018   Procedure: CARDIOVERSION;  Surgeon: Buford Dresser, MD;  Location: [facility] ENDOSCOPY;  Service: Cardiovascular;  Laterality: N/A;   CARDIOVERSION N/A 07/08/2020   Procedure: CARDIOVERSION;  Surgeon: Jerline Pain, MD;  Location: [facility] ENDOSCOPY;  Service: Cardiovascular;  Laterality: N/A;   HEMIARTHROPLASTY HIP  10/31/2013   IT HIP BIPOLAR   LUMBAR SPINE SURGERY     PACEMAKER PLACEMENT  01/29/2017   SHOULDER SURGERY Right     Family History  Problem Relation Age of Onset   Heart attack Mother    Transient ischemic attack Mother    Heart Problems Father    Tuberculosis Brother    Diabetes type II Other    Hyperlipidemia Other    Congestive Heart Failure Other    Arthritis Other    Social History   Socioeconomic History   Marital status: Married    Spouse name: [name]   Number of children: 3   Years of education: Not on file   Highest education level: Not on file  Occupational History    Occupation: RETIRED    Comment: school principal  Tobacco Use   Smoking status: Never   Smokeless tobacco: Never  Vaping Use   Vaping Use: Never used  Substance and Sexual Activity   Alcohol use: Not Currently   Drug use: Never   Sexual activity: Not on file  Other Topics Concern   Not on file  Social History Narrative   Lives with wife   Social Determinants of Health   Financial Resource Strain: Low Risk  (11/24/2021)   Overall Financial Resource Strain (CARDIA)    Difficulty of Paying Living Expenses: Not hard at all  Food Insecurity: No Food Insecurity (11/24/2021)   Hunger Vital Sign    Worried About Running Out of Food in the Last Year: Never true    [place] in the Last Year: Never true  Transportation Needs: No Transportation Needs (11/24/2021)   PRAPARE - Hydrologist (Medical): No    Lack of Transportation (Non-Medical): No  Physical Activity: Sufficiently Active (11/24/2021)   Exercise Vital Sign    Days of Exercise per Week: 4 days    Minutes of Exercise per Session: 60 min  Stress: No Stress Concern Present (11/24/2021)   [place]    Feeling of Stress : Not at all  Social Connections: [place] (11/24/2021)   Social Connection and Isolation Panel [NHANES]    Frequency of Communication with Friends and Family: More than three times a week    Frequency of Social Gatherings with Friends and Family: More than three times a week    Attends Religious Services: More than 4 times per year    Active Member of [employer] or Organizations: Yes    Attends Music therapist: More than 4 times per year    Marital Status: Married    Objective:  BP 114/64  Pulse 88   Temp (!) 97.3 F (36.3 C)   Ht '6\' 3"'$  (1.905 m)   Wt 240 lb (108.9 kg)   SpO2 98%   BMI 30.00 kg/m      08/23/2022    3:29 PM 07/19/2022    8:30 AM 05/30/2022   10:49 AM  BP/Weight  Systolic  BP 99991111 [id number]  Diastolic BP 64 68 60  Wt. (Lbs) 240 247 237  BMI 30 kg/m2 30.87 kg/m2 29.62 kg/m2    Physical Exam Vitals reviewed.  Constitutional:      Appearance: Normal appearance.  Neck:     Vascular: No carotid bruit.  Cardiovascular:     Rate and Rhythm: Normal rate and regular rhythm.     Heart sounds: Normal heart sounds.  Pulmonary:     Effort: Pulmonary effort is normal.     Breath sounds: Normal breath sounds. No wheezing, rhonchi or rales.  Abdominal:     General: Bowel sounds are normal.     Palpations: Abdomen is soft.     Tenderness: There is no abdominal tenderness.  Neurological:     Mental Status: He is alert.     Gait: Gait abnormal (using walker).  Psychiatric:        Mood and Affect: Mood normal.        Behavior: Behavior normal.     Diabetic Foot Exam - Simple   No data filed      Lab Results  Component Value Date   WBC 8.2 08/23/2022   HGB 12.2 (L) 08/23/2022   HCT 37.2 (L) 08/23/2022   PLT 228 08/23/2022   GLUCOSE 149 (H) 08/23/2022   CHOL 129 07/19/2022   TRIG 56 07/19/2022   HDL 55 07/19/2022   LDLCALC 62 07/19/2022   ALT 14 08/23/2022   AST 20 08/23/2022   NA 139 08/23/2022   K 4.9 08/23/2022   CL 104 08/23/2022   CREATININE 1.43 (H) 08/23/2022   BUN 23 08/23/2022   CO2 20 08/23/2022   TSH 7.530 (H) 08/23/2022   HGBA1C 7.3 (H) 07/19/2022   MICROALBUR 80 01/20/2021      Assessment & Plan:    Acute cystitis without hematuria Assessment & Plan: Prescription: cipro.  Orders: -     POCT URINALYSIS DIP (CLINITEK) -     Ciprofloxacin HCl; Take 1 tablet (250 mg total) by mouth 2 (two) times daily for 7 days.  Dispense: 14 tablet; Refill: 0 -     Urine Culture  Acquired hypothyroidism Assessment & Plan: Previously well controlled Continue Synthroid at current dose  Recheck TSH and adjust Synthroid as indicated    Orders: -     T4, free -     TSH  Hypertensive heart and renal disease, stage 1-4 or unspecified  chronic kidney disease, with heart failure ([place]) Assessment & Plan: Stable.  Orders: -     CBC with Differential/Platelet -     Comprehensive metabolic panel  Chronic kidney disease, stage 3b ([place]) Assessment & Plan: Stable.      Meds ordered this encounter  Medications   ciprofloxacin (CIPRO) 250 MG tablet    Sig: Take 1 tablet (250 mg total) by mouth 2 (two) times daily for 7 days.    Dispense:  14 tablet    Refill:  0    Orders Placed This Encounter  Procedures   Urine Culture   CBC with Differential/Platelet   Comprehensive metabolic panel   T4, free   TSH  POCT URINALYSIS DIP (CLINITEK)     Follow-up: Return in about 2 months (around 10/23/2022) for chronic fasting.   I,Katherina A Bramblett,acting as a scribe for [name], MD.,have documented all relevant documentation on the behalf of [name], MD,as directed by  [name], MD while in the presence of [name], MD.   An After Visit Summary was printed and given to the patient.  I attest that I have reviewed this visit and agree with the plan scribed by my staff.   [name], MD [facility] [phone]

## 2022-08-24 LAB — CBC WITH DIFFERENTIAL/PLATELET
Basophils Absolute: 0 10*3/uL (ref 0.0–0.2)
Basos: 1 %
EOS (ABSOLUTE): 0.4 10*3/uL (ref 0.0–0.4)
Eos: 5 %
Hematocrit: 37.2 % — ABNORMAL LOW (ref 37.5–51.0)
Hemoglobin: 12.2 g/dL — ABNORMAL LOW (ref 13.0–17.7)
Immature Grans (Abs): 0 10*3/uL (ref 0.0–0.1)
Immature Granulocytes: 0 %
Lymphocytes Absolute: 0.9 10*3/uL (ref 0.7–3.1)
Lymphs: 11 %
MCH: 33.1 pg — ABNORMAL HIGH (ref 26.6–33.0)
MCHC: 32.8 g/dL (ref 31.5–35.7)
MCV: 101 fL — ABNORMAL HIGH (ref 79–97)
Monocytes Absolute: 0.8 10*3/uL (ref 0.1–0.9)
Monocytes: 10 %
Neutrophils Absolute: 6.1 10*3/uL (ref 1.4–7.0)
Neutrophils: 73 %
Platelets: 228 10*3/uL (ref 150–450)
RBC: 3.69 x10E6/uL — ABNORMAL LOW (ref 4.14–5.80)
RDW: 13.2 % (ref 11.6–15.4)
WBC: 8.2 10*3/uL (ref 3.4–10.8)

## 2022-08-24 LAB — T4, FREE: Free T4: 1.67 ng/dL (ref 0.82–1.77)

## 2022-08-24 LAB — COMPREHENSIVE METABOLIC PANEL
ALT: 14 IU/L (ref 0–44)
AST: 20 IU/L (ref 0–40)
Albumin/Globulin Ratio: 1.3 (ref 1.2–2.2)
Albumin: 3.7 g/dL (ref 3.7–4.7)
Alkaline Phosphatase: 266 IU/L — ABNORMAL HIGH (ref 44–121)
BUN/Creatinine Ratio: 16 (ref 10–24)
BUN: 23 mg/dL (ref 8–27)
Bilirubin Total: 0.7 mg/dL (ref 0.0–1.2)
CO2: 20 mmol/L (ref 20–29)
Calcium: 8.7 mg/dL (ref 8.6–10.2)
Chloride: 104 mmol/L (ref 96–106)
Creatinine, Ser: 1.43 mg/dL — ABNORMAL HIGH (ref 0.76–1.27)
Globulin, Total: 2.8 g/dL (ref 1.5–4.5)
Glucose: 149 mg/dL — ABNORMAL HIGH (ref 70–99)
Potassium: 4.9 mmol/L (ref 3.5–5.2)
Sodium: 139 mmol/L (ref 134–144)
Total Protein: 6.5 g/dL (ref 6.0–8.5)
eGFR: 49 mL/min/{1.73_m2} — ABNORMAL LOW (ref >59)

## 2022-08-24 LAB — TSH: TSH: 7.53 u[IU]/mL — ABNORMAL HIGH (ref 0.450–4.500)

## 2022-08-25 LAB — URINE CULTURE

## 2022-08-27 ENCOUNTER — Encounter: Payer: Self-pay | Admitting: Family Medicine

## 2022-08-27 DIAGNOSIS — N3000 Acute cystitis without hematuria: Secondary | ICD-10-CM | POA: Insufficient documentation

## 2022-08-27 NOTE — Assessment & Plan Note (Signed)
Prescription: cipro. 

## 2022-08-27 NOTE — Assessment & Plan Note (Signed)
Stable

## 2022-08-27 NOTE — Assessment & Plan Note (Signed)
Previously well controlled Continue Synthroid at current dose  Recheck TSH and adjust Synthroid as indicated   

## 2022-08-28 ENCOUNTER — Other Ambulatory Visit: Payer: Medicare Other

## 2022-08-29 ENCOUNTER — Telehealth: Payer: Self-pay

## 2022-08-29 DIAGNOSIS — M800AXD Age-related osteoporosis with current pathological fracture, other site, subsequent encounter for fracture with routine healing: Secondary | ICD-10-CM | POA: Diagnosis not present

## 2022-08-29 DIAGNOSIS — N2581 Secondary hyperparathyroidism of renal origin: Secondary | ICD-10-CM | POA: Diagnosis not present

## 2022-08-29 DIAGNOSIS — I130 Hypertensive heart and chronic kidney disease with heart failure and stage 1 through stage 4 chronic kidney disease, or unspecified chronic kidney disease: Secondary | ICD-10-CM | POA: Diagnosis not present

## 2022-08-29 DIAGNOSIS — I5089 Other heart failure: Secondary | ICD-10-CM | POA: Diagnosis not present

## 2022-08-29 DIAGNOSIS — N1832 Chronic kidney disease, stage 3b: Secondary | ICD-10-CM | POA: Diagnosis not present

## 2022-08-29 DIAGNOSIS — E1122 Type 2 diabetes mellitus with diabetic chronic kidney disease: Secondary | ICD-10-CM | POA: Diagnosis not present

## 2022-08-29 NOTE — Telephone Encounter (Signed)
Per [facility] PT, Patient did not come to door or answer phone so PT cancelled appointment stating that patient was not responding to phone calls or visits. Wanted to notate in patient's chart.

## 2022-08-31 ENCOUNTER — Other Ambulatory Visit: Payer: Self-pay | Admitting: Family Medicine

## 2022-08-31 DIAGNOSIS — I130 Hypertensive heart and chronic kidney disease with heart failure and stage 1 through stage 4 chronic kidney disease, or unspecified chronic kidney disease: Secondary | ICD-10-CM

## 2022-08-31 DIAGNOSIS — K219 Gastro-esophageal reflux disease without esophagitis: Secondary | ICD-10-CM

## 2022-08-31 DIAGNOSIS — M800AXD Age-related osteoporosis with current pathological fracture, other site, subsequent encounter for fracture with routine healing: Secondary | ICD-10-CM

## 2022-09-04 DIAGNOSIS — H2512 Age-related nuclear cataract, left eye: Secondary | ICD-10-CM | POA: Diagnosis not present

## 2022-09-04 DIAGNOSIS — H52202 Unspecified astigmatism, left eye: Secondary | ICD-10-CM | POA: Diagnosis not present

## 2022-09-04 DIAGNOSIS — H25812 Combined forms of age-related cataract, left eye: Secondary | ICD-10-CM | POA: Diagnosis not present

## 2022-09-04 DIAGNOSIS — H269 Unspecified cataract: Secondary | ICD-10-CM | POA: Diagnosis not present

## 2022-09-07 ENCOUNTER — Ambulatory Visit (INDEPENDENT_AMBULATORY_CARE_PROVIDER_SITE_OTHER): Payer: Medicare Other

## 2022-09-07 DIAGNOSIS — I495 Sick sinus syndrome: Secondary | ICD-10-CM | POA: Diagnosis not present

## 2022-09-07 LAB — CUP PACEART REMOTE DEVICE CHECK
Battery Voltage: 45
Date Time Interrogation Session: 20240321083503
Implantable Lead Connection Status: 753985
Implantable Lead Connection Status: 753985
Implantable Lead Implant Date: 20180813
Implantable Lead Implant Date: 20180813
Implantable Lead Location: 753859
Implantable Lead Location: 753860
Implantable Lead Model: 377
Implantable Lead Model: 377
Implantable Lead Serial Number: 49893169
Implantable Lead Serial Number: 50011411
Implantable Pulse Generator Implant Date: 20180813
Pulse Gen Model: 407145
Pulse Gen Serial Number: 69158272

## 2022-09-12 DIAGNOSIS — N2581 Secondary hyperparathyroidism of renal origin: Secondary | ICD-10-CM | POA: Diagnosis not present

## 2022-09-12 DIAGNOSIS — I5089 Other heart failure: Secondary | ICD-10-CM | POA: Diagnosis not present

## 2022-09-12 DIAGNOSIS — I130 Hypertensive heart and chronic kidney disease with heart failure and stage 1 through stage 4 chronic kidney disease, or unspecified chronic kidney disease: Secondary | ICD-10-CM | POA: Diagnosis not present

## 2022-09-12 DIAGNOSIS — E1122 Type 2 diabetes mellitus with diabetic chronic kidney disease: Secondary | ICD-10-CM | POA: Diagnosis not present

## 2022-09-12 DIAGNOSIS — M800AXD Age-related osteoporosis with current pathological fracture, other site, subsequent encounter for fracture with routine healing: Secondary | ICD-10-CM | POA: Diagnosis not present

## 2022-09-12 DIAGNOSIS — Z8551 Personal history of malignant neoplasm of bladder: Secondary | ICD-10-CM | POA: Diagnosis not present

## 2022-09-12 DIAGNOSIS — N1832 Chronic kidney disease, stage 3b: Secondary | ICD-10-CM | POA: Diagnosis not present

## 2022-09-12 DIAGNOSIS — N401 Enlarged prostate with lower urinary tract symptoms: Secondary | ICD-10-CM | POA: Diagnosis not present

## 2022-09-13 ENCOUNTER — Other Ambulatory Visit: Payer: Self-pay | Admitting: Family Medicine

## 2022-09-13 ENCOUNTER — Telehealth: Payer: Self-pay

## 2022-09-13 DIAGNOSIS — L84 Corns and callosities: Secondary | ICD-10-CM | POA: Diagnosis not present

## 2022-09-13 DIAGNOSIS — I5089 Other heart failure: Secondary | ICD-10-CM | POA: Diagnosis not present

## 2022-09-13 DIAGNOSIS — K219 Gastro-esophageal reflux disease without esophagitis: Secondary | ICD-10-CM | POA: Diagnosis not present

## 2022-09-13 DIAGNOSIS — G4733 Obstructive sleep apnea (adult) (pediatric): Secondary | ICD-10-CM | POA: Diagnosis not present

## 2022-09-13 DIAGNOSIS — K279 Peptic ulcer, site unspecified, unspecified as acute or chronic, without hemorrhage or perforation: Secondary | ICD-10-CM | POA: Diagnosis not present

## 2022-09-13 DIAGNOSIS — G250 Essential tremor: Secondary | ICD-10-CM | POA: Diagnosis not present

## 2022-09-13 DIAGNOSIS — E1142 Type 2 diabetes mellitus with diabetic polyneuropathy: Secondary | ICD-10-CM | POA: Diagnosis not present

## 2022-09-13 DIAGNOSIS — N529 Male erectile dysfunction, unspecified: Secondary | ICD-10-CM | POA: Diagnosis not present

## 2022-09-13 DIAGNOSIS — N138 Other obstructive and reflux uropathy: Secondary | ICD-10-CM | POA: Diagnosis not present

## 2022-09-13 DIAGNOSIS — N1832 Chronic kidney disease, stage 3b: Secondary | ICD-10-CM | POA: Diagnosis not present

## 2022-09-13 DIAGNOSIS — I495 Sick sinus syndrome: Secondary | ICD-10-CM | POA: Diagnosis not present

## 2022-09-13 DIAGNOSIS — N2581 Secondary hyperparathyroidism of renal origin: Secondary | ICD-10-CM | POA: Diagnosis not present

## 2022-09-13 DIAGNOSIS — E1151 Type 2 diabetes mellitus with diabetic peripheral angiopathy without gangrene: Secondary | ICD-10-CM | POA: Diagnosis not present

## 2022-09-13 DIAGNOSIS — B9620 Unspecified Escherichia coli [E. coli] as the cause of diseases classified elsewhere: Secondary | ICD-10-CM | POA: Diagnosis not present

## 2022-09-13 DIAGNOSIS — N3000 Acute cystitis without hematuria: Secondary | ICD-10-CM | POA: Diagnosis not present

## 2022-09-13 DIAGNOSIS — E291 Testicular hypofunction: Secondary | ICD-10-CM | POA: Diagnosis not present

## 2022-09-13 DIAGNOSIS — I4819 Other persistent atrial fibrillation: Secondary | ICD-10-CM | POA: Diagnosis not present

## 2022-09-13 DIAGNOSIS — E1122 Type 2 diabetes mellitus with diabetic chronic kidney disease: Secondary | ICD-10-CM | POA: Diagnosis not present

## 2022-09-13 DIAGNOSIS — N401 Enlarged prostate with lower urinary tract symptoms: Secondary | ICD-10-CM | POA: Diagnosis not present

## 2022-09-13 DIAGNOSIS — I130 Hypertensive heart and chronic kidney disease with heart failure and stage 1 through stage 4 chronic kidney disease, or unspecified chronic kidney disease: Secondary | ICD-10-CM | POA: Diagnosis not present

## 2022-09-13 DIAGNOSIS — M800AXD Age-related osteoporosis with current pathological fracture, other site, subsequent encounter for fracture with routine healing: Secondary | ICD-10-CM | POA: Diagnosis not present

## 2022-09-13 DIAGNOSIS — E039 Hypothyroidism, unspecified: Secondary | ICD-10-CM | POA: Diagnosis not present

## 2022-09-13 DIAGNOSIS — Z8551 Personal history of malignant neoplasm of bladder: Secondary | ICD-10-CM | POA: Diagnosis not present

## 2022-09-13 DIAGNOSIS — J9611 Chronic respiratory failure with hypoxia: Secondary | ICD-10-CM | POA: Diagnosis not present

## 2022-09-13 DIAGNOSIS — J342 Deviated nasal septum: Secondary | ICD-10-CM | POA: Diagnosis not present

## 2022-09-13 DIAGNOSIS — D6859 Other primary thrombophilia: Secondary | ICD-10-CM | POA: Diagnosis not present

## 2022-09-13 NOTE — Telephone Encounter (Signed)
Called and left voicemail for patient to call back.

## 2022-09-13 NOTE — Telephone Encounter (Signed)
Patient informed, patient Agrees to stopping the [place] and will increase his Toujeo to 30 units daily and then states that he will think about doing either the ozempic or another GLP 1 medication and let us know. He states that he would want to know how this goes first and then call us back and let us know.

## 2022-09-13 NOTE — Telephone Encounter (Signed)
Estill Bamberg from [facility] called about mutual patient Paul Kent stating that Dr. Nila Nephew had seen him today and states that he suggests that Dr. Tobie Poet stop his [place] because he believes this is the reason why his bladder is staying aggravated. Per Dr. Tobie Poet we will stop the [place] and we will switch the patient to a different medication for his diabetes and give the patient a call.  Per Dr. Tobie Poet we are to call the patient and tell him to stop the [place] and to increase his Toujeo to 30 units daily and ask patient if he is willing to try another GLP-1 medication because this would be another good medication class to switch him to and she is aware that the patient did not like the ozempic because it made him nauseous.

## 2022-09-19 DIAGNOSIS — N1832 Chronic kidney disease, stage 3b: Secondary | ICD-10-CM | POA: Diagnosis not present

## 2022-09-19 DIAGNOSIS — N2581 Secondary hyperparathyroidism of renal origin: Secondary | ICD-10-CM | POA: Diagnosis not present

## 2022-09-19 DIAGNOSIS — I130 Hypertensive heart and chronic kidney disease with heart failure and stage 1 through stage 4 chronic kidney disease, or unspecified chronic kidney disease: Secondary | ICD-10-CM | POA: Diagnosis not present

## 2022-09-19 DIAGNOSIS — E1122 Type 2 diabetes mellitus with diabetic chronic kidney disease: Secondary | ICD-10-CM | POA: Diagnosis not present

## 2022-09-19 DIAGNOSIS — M800AXD Age-related osteoporosis with current pathological fracture, other site, subsequent encounter for fracture with routine healing: Secondary | ICD-10-CM | POA: Diagnosis not present

## 2022-09-19 DIAGNOSIS — I5089 Other heart failure: Secondary | ICD-10-CM | POA: Diagnosis not present

## 2022-09-21 ENCOUNTER — Other Ambulatory Visit: Payer: Self-pay | Admitting: Family Medicine

## 2022-09-22 ENCOUNTER — Ambulatory Visit (INDEPENDENT_AMBULATORY_CARE_PROVIDER_SITE_OTHER): Payer: Medicare Other | Admitting: Family Medicine

## 2022-09-22 VITALS — BP 120/72 | HR 92 | Temp 97.0°F | Ht 75.0 in | Wt 251.0 lb

## 2022-09-22 DIAGNOSIS — E1142 Type 2 diabetes mellitus with diabetic polyneuropathy: Secondary | ICD-10-CM | POA: Diagnosis not present

## 2022-09-22 DIAGNOSIS — B379 Candidiasis, unspecified: Secondary | ICD-10-CM | POA: Insufficient documentation

## 2022-09-22 DIAGNOSIS — G250 Essential tremor: Secondary | ICD-10-CM

## 2022-09-22 DIAGNOSIS — N3000 Acute cystitis without hematuria: Secondary | ICD-10-CM

## 2022-09-22 LAB — POCT URINALYSIS DIP (CLINITEK)
Bilirubin, UA: NEGATIVE
Glucose, UA: >=1000 mg/dL — AB
Ketones, POC UA: NEGATIVE mg/dL
Nitrite, UA: NEGATIVE
POC PROTEIN,UA: 30 — AB
Spec Grav, UA: 1.025 (ref 1.010–1.025)
Urobilinogen, UA: 0.2 E.U./dL
pH, UA: 6 (ref 5.0–8.0)

## 2022-09-22 MED ORDER — FLUCONAZOLE 150 MG PO TABS
150.0000 mg | ORAL_TABLET | Freq: Once | ORAL | 0 refills | Status: AC
Start: 2022-09-22 — End: 2022-09-22

## 2022-09-22 MED ORDER — PRIMIDONE 50 MG PO TABS
50.0000 mg | ORAL_TABLET | Freq: Every day | ORAL | 0 refills | Status: DC
Start: 2022-09-22 — End: 2022-10-26

## 2022-09-22 MED ORDER — CIPROFLOXACIN HCL 250 MG PO TABS
250.0000 mg | ORAL_TABLET | Freq: Two times a day (BID) | ORAL | 0 refills | Status: AC
Start: 2022-09-22 — End: 2022-09-27

## 2022-09-22 NOTE — Assessment & Plan Note (Signed)
Hold glipizide while on Cipro as this can increase hypoglycemic episodes (drops in sugars.) When you restart take before largest meal of the day.

## 2022-09-22 NOTE — Assessment & Plan Note (Signed)
Take Cipro 250 mg twice daily for 5 days.

## 2022-09-22 NOTE — Assessment & Plan Note (Signed)
Sent Diflucan 150 mg 1 daily x 1 day.

## 2022-09-22 NOTE — Assessment & Plan Note (Signed)
Start primidone 50 mg nightly.

## 2022-09-22 NOTE — Progress Notes (Signed)
Acute Office Visit  Subjective:    Patient ID: Paul Kent, male    DOB: March 03, 1941, 82 y.o.   MRN: [id number]  Chief Complaint  Patient presents with   Urinary Tract Infection    HPI: Patient is in today for UTI. Patient states that he is still having dysuria a little. No abdominal pain or flank pain. No fever.  Patient has gained 10 lbs. Sugars 79-89. Increased toujeo 30 u daily. Takes glipizide about 6 pm. Eating breakfast, lunch. Then eats small things through out the day.   Tremor has worsened. Bp will not allow use of propranolol.  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   Cellulitis of right lower limb    CHF (congestive heart failure) 02/2018   Chronic atrial fibrillation    Chronic ulcer of right heel with fat layer exposed 04/26/2021   Corns and callosities    Deviated septum 07/01/2018   Deviated septum 07/01/2018   Diabetes mellitus due to underlying condition with unspecified complications 06/08/2016   Diabetic ulcer of right heel associated with diabetes mellitus due to underlying condition, limited to breakdown of skin 07/31/2021   Disturbances of salivary secretion    Epistaxis    Essential hypertension    Essential tremor    H pylori ulcer    H pylori ulcer    Herpes zoster with nervous system complication 06/03/2013   Hesitancy of micturition    Hyperlipidemia    Hypothyroidism    Impacted cerumen, bilateral    Jaw pain    Lesion of femoral nerve 04/30/2013   Localized edema    Male erectile disorder    Malignant neoplasm of overlapping sites of bladder    Nasal congestion 07/01/2018   Nasal turbinate hypertrophy 07/01/2018   Obstructive sleep apnea 01/17/2017   Other constipation    Other fatigue    Overweight     Pacemaker reprogramming/check 02/12/2017   Pain in right finger(s)    Pain in right lower leg    Paroxysmal atrial fibrillation 06/08/2016   Peptic ulcer disease    Persistent atrial fibrillation 06/01/2017   Primary insomnia    Renal stones    Renal stones    Secondary hypercoagulable state 06/17/2020   Shingles 2014   WITH COMPLICATIONS (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)   Shingles 06/2012   Shortness of breath    Sick sinus syndrome    Sinus bradycardia 03/07/2017   Sleep apnea    dx 08/2015, CPAP   Spontaneous ecchymoses    Status post ablation of atrial fibrillation 09/16/2020   Testicular hypofunction    Thoracic or lumbosacral neuritis or radiculitis 06/03/2013   Trigger middle finger of right hand 11/28/2019   Trigger middle finger of right hand 11/28/2019   Type 2 diabetes mellitus with circulatory disorder, without long-term current use of insulin 01/17/2017    Past Surgical History:  Procedure Laterality Date   ABLATION  06/2017   ATRIAL FIBRILLATION ABLATION N/A 05/21/2020   Procedure: ATRIAL FIBRILLATION ABLATION;  Surgeon: Regan Lemming, MD;  Location: [facility];  Service: Cardiovascular;  Laterality: N/A;   CARDIOVERSION N/A 03/22/2018   Procedure: CARDIOVERSION;  Surgeon: Jodelle Red, MD;  Location:  [facility] ENDOSCOPY;  Service: Cardiovascular;  Laterality: N/A;   CARDIOVERSION N/A 07/08/2020   Procedure: CARDIOVERSION;  Surgeon: Jake Bathe, MD;  Location: [facility] ENDOSCOPY;  Service: Cardiovascular;  Laterality: N/A;   HEMIARTHROPLASTY HIP  10/31/2013   IT HIP BIPOLAR   LUMBAR SPINE SURGERY     PACEMAKER PLACEMENT  01/29/2017   SHOULDER SURGERY Right     Family History  Problem Relation Age of Onset   Heart attack Mother    Transient ischemic attack Mother    Heart Problems Father    Tuberculosis Brother    Diabetes type II Other    Hyperlipidemia Other    Congestive Heart Failure Other    Arthritis Other      Social History   Socioeconomic History   Marital status: Married    Spouse name: Kennon Rounds   Number of children: 3   Years of education: Not on file   Highest education level: Not on file  Occupational History   Occupation: RETIRED    Comment: school principal  Tobacco Use   Smoking status: Never   Smokeless tobacco: Never  Vaping Use   Vaping Use: Never used  Substance and Sexual Activity   Alcohol use: Not Currently   Drug use: Never   Sexual activity: Not on file  Other Topics Concern   Not on file  Social History Narrative   Lives with wife   Social Determinants of Health   Financial Resource Strain: Low Risk  (11/24/2021)   Overall Financial Resource Strain (CARDIA)    Difficulty of Paying Living Expenses: Not hard at all  Food Insecurity: No Food Insecurity (11/24/2021)   Hunger Vital Sign    Worried About Running Out of Food in the Last Year: Never true    Ran Out of Food in the Last Year: Never true  Transportation Needs: No Transportation Needs (11/24/2021)   PRAPARE - Administrator, Civil Service (Medical): No    Lack of Transportation (Non-Medical): No  Physical Activity: Sufficiently Active (11/24/2021)   Exercise Vital Sign    Days of Exercise per Week: 4 days    Minutes of Exercise per Session: 60 min  Stress: No Stress Concern Present (11/24/2021)   [employer] of Occupational Health - Occupational Stress Questionnaire    Feeling of Stress : Not at all  Social Connections: Socially Integrated (11/24/2021)   Social Connection and Isolation Panel [NHANES]    Frequency of Communication with Friends and Family: More than three times a week    Frequency of Social Gatherings with Friends and Family: More than three times a week    Attends Religious Services: More than 4 times per year    Active Member of [employer] or Organizations: Yes    Attends Banker Meetings: More than 4 times per year    Marital Status: Married  Catering manager  Violence: Not At Risk (11/24/2021)   Humiliation, Afraid, Rape, and Kick questionnaire    Fear of Current or Ex-Partner: No    Emotionally Abused: No    Physically Abused: No    Sexually Abused: No    Outpatient Medications Prior to Visit  Medication Sig Dispense Refill   amiodarone (PACERONE) 200 MG tablet TAKE ONE TABLET BY MOUTH EVERY DAY 90 tablet 1   atorvastatin (LIPITOR) 10 MG tablet TAKE ONE TABLET BY MOUTH EVERY EVENING 90 tablet 1   B-D UF III MINI PEN NEEDLES 31G X 5 MM MISC USE AS DIRECTED EVERY DAY  to inject insulin 100 each 1   carvedilol (COREG) 12.5 MG tablet Take 1 tablet (12.5 mg total) by mouth 2 (two) times daily. 180 tablet 3   Continuous Blood Gluc Sensor (DEXCOM G7 SENSOR) MISC To use as directed to monitor glucose 3 each 3   famotidine (PEPCID) 40 MG tablet Take 1 tablet (40 mg total) by mouth daily. 90 tablet 1   Finerenone (KERENDIA) 10 MG TABS Take 1 tablet (10 mg total) by mouth daily. 30 tablet 2   furosemide (LASIX) 40 MG tablet Take 1 tablet (40 mg total) by mouth daily. 30 tablet 1   gabapentin (NEURONTIN) 300 MG capsule Take 1 capsule (300 mg total) by mouth 2 (two) times daily. 180 capsule 1   glipiZIDE (GLUCOTROL XL) 10 MG 24 hr tablet Take 1 tablet (10 mg total) by mouth daily. 1 tablet 0   insulin glargine, 1 Unit Dial, (TOUJEO SOLOSTAR) 300 UNIT/ML Solostar Pen Inject 24 Units into the skin daily at 6 (six) AM. 4.5 mL 1   levothyroxine (SYNTHROID) 112 MCG tablet Take 1 tablet (112 mcg total) by mouth daily. 60 tablet 0   tamsulosin (FLOMAX) 0.4 MG CAPS capsule Take 1 capsule (0.4 mg total) by mouth daily after supper. 30 capsule 1   testosterone cypionate (DEPOTESTOSTERONE CYPIONATE) 200 MG/ML injection INJECT 200MG  (1 MILLILITER) into the muscle EVERY 14 DAYS 10 mL 0   XARELTO 20 MG TABS tablet TAKE ONE TABLET BY MOUTH EVERY EVENING 90 tablet 1   cefdinir (OMNICEF) 300 MG capsule Take 300 mg by mouth 2 (two) times daily.     No facility-administered  medications prior to visit.    Allergies  Allergen Reactions   Propranolol     Drops HR too low  Other reaction(s): Other (See Comments) Drops HR too low Drops HR too low Drops HR too low   Canagliflozin     Patient had an adverse reaction Other reaction(s): Other (See Comments) Patient had an adverse reaction Patient had an adverse reaction   Clonidine Derivatives Nausea And Vomiting   Hydralazine Hcl     Patient had an adverse reaction    Metformin And Related Diarrhea   Topiramate     Patient had an adverse reaction Other reaction(s): Other (See Comments) Patient had an adverse reaction Patient had an adverse reaction   Ambien [Zolpidem] Other (See Comments)    Amnesia    Farxiga [Dapagliflozin]     Recurrent UTIs.    Ozempic (0.25 Or 0.5 Mg-Dose) [Semaglutide(0.25 Or 0.5mg -Dos)] Other (See Comments)    Nausea, abdominal pain     Review of Systems  Constitutional:  Negative for fatigue.  HENT:  Negative for congestion, ear pain and sore throat.   Respiratory:  Negative for cough and shortness of breath.   Cardiovascular:  Negative for chest pain.  Gastrointestinal:  Negative for abdominal pain, constipation, diarrhea, nausea and vomiting.  Genitourinary:  Positive for dysuria. Negative for frequency and urgency.  Musculoskeletal:  Negative for arthralgias, back pain and myalgias.  Neurological:  Negative for dizziness and headaches.  Psychiatric/Behavioral:  Negative for agitation and sleep disturbance. The patient is not nervous/anxious.        Objective:        09/22/2022    7:26 AM 08/23/2022    3:29 PM 07/19/2022    8:30 AM  Vitals with BMI  Height 6\' 3"  6\' 3"  6\' 3"   Weight 251 lbs 240 lbs 247 lbs  BMI 31.37 30 30.87  Systolic  120 114 118  Diastolic 72 64 68  Pulse 92 88 61    Orthostatic VS for the past 72 hrs (Last 3 readings):  Patient Position BP Location Cuff Size  09/22/22 0726 Sitting Right Arm Large     Physical Exam Exam conducted  with a chaperone present.  Constitutional:      Appearance: Normal appearance.  Cardiovascular:     Rate and Rhythm: Normal rate and regular rhythm.     Pulses: Normal pulses.     Heart sounds: Normal heart sounds.  Pulmonary:     Effort: Pulmonary effort is normal.     Breath sounds: Normal breath sounds.  Abdominal:     General: Bowel sounds are normal.     Palpations: Abdomen is soft. There is no mass.     Tenderness: There is no abdominal tenderness.  Genitourinary:    Pubic Area: No rash.      Penis: Erythema (corona) present. No discharge.   Neurological:     Mental Status: He is alert.  Psychiatric:        Mood and Affect: Mood normal.        Behavior: Behavior normal.        Thought Content: Thought content normal.     Health Maintenance Due  Topic Date Due   DTaP/Tdap/Td (1 - Tdap) Never done    There are no preventive care reminders to display for this patient.   Lab Results  Component Value Date   TSH 7.530 (H) 08/23/2022   Lab Results  Component Value Date   WBC 8.2 08/23/2022   HGB 12.2 (L) 08/23/2022   HCT 37.2 (L) 08/23/2022   MCV 101 (H) 08/23/2022   PLT 228 08/23/2022   Lab Results  Component Value Date   NA 139 08/23/2022   K 4.9 08/23/2022   CO2 20 08/23/2022   GLUCOSE 149 (H) 08/23/2022   BUN 23 08/23/2022   CREATININE 1.43 (H) 08/23/2022   BILITOT 0.7 08/23/2022   ALKPHOS 266 (H) 08/23/2022   AST 20 08/23/2022   ALT 14 08/23/2022   PROT 6.5 08/23/2022   ALBUMIN 3.7 08/23/2022   CALCIUM 8.7 08/23/2022   EGFR 49 (L) 08/23/2022   Lab Results  Component Value Date   CHOL 129 07/19/2022   Lab Results  Component Value Date   HDL 55 07/19/2022   Lab Results  Component Value Date   LDLCALC 62 07/19/2022   Lab Results  Component Value Date   TRIG 56 07/19/2022   Lab Results  Component Value Date   CHOLHDL 2.3 07/19/2022   Lab Results  Component Value Date   HGBA1C 7.3 (H) 07/19/2022       Assessment & Plan:  Acute  cystitis without hematuria Assessment & Plan: Take Cipro 250 mg twice daily for 5 days.  Orders: -     POCT URINALYSIS DIP (CLINITEK) -     Urine Culture -     Ciprofloxacin HCl; Take 1 tablet (250 mg total) by mouth 2 (two) times daily for 5 days.  Dispense: 10 tablet; Refill: 0  Diabetic polyneuropathy associated with type 2 diabetes mellitus Assessment & Plan: Hold glipizide while on Cipro as this can increase hypoglycemic episodes (drops in sugars.) When you restart take before largest meal of the day.   Yeast infection Assessment & Plan: Sent Diflucan 150 mg 1 daily x 1 day.  Orders: -     Fluconazole; Take 1 tablet (150 mg total) by mouth once  for 1 dose.  Dispense: 1 tablet; Refill: 0  Essential tremor Assessment & Plan: Start primidone 50 mg nightly.  Orders: -     Primidone; Take 1 tablet (50 mg total) by mouth at bedtime.  Dispense: 30 tablet; Refill: 0     Meds ordered this encounter  Medications   ciprofloxacin (CIPRO) 250 MG tablet    Sig: Take 1 tablet (250 mg total) by mouth 2 (two) times daily for 5 days.    Dispense:  10 tablet    Refill:  0   primidone (MYSOLINE) 50 MG tablet    Sig: Take 1 tablet (50 mg total) by mouth at bedtime.    Dispense:  30 tablet    Refill:  0   fluconazole (DIFLUCAN) 150 MG tablet    Sig: Take 1 tablet (150 mg total) by mouth once for 1 dose.    Dispense:  1 tablet    Refill:  0    Orders Placed This Encounter  Procedures   Urine Culture   POCT URINALYSIS DIP (CLINITEK)     Follow-up: Return for chronic follow up.  An After Visit Summary was printed and given to the patient.  Clayborn Bigness I Leal-Borjas,acting as a scribe for Blane Ohara, MD.,have documented all relevant documentation on the behalf of Blane Ohara, MD,as directed by  Blane Ohara, MD while in the presence of Blane Ohara, MD.   I attest that I have reviewed this visit and agree with the plan scribed by my staff.   Blane Ohara, MD [facility] [phone]

## 2022-09-22 NOTE — Patient Instructions (Signed)
Urinary tract Infection: Take Cipro 250 mg twice daily for 5 days. Yeast infection: Diflucan 150 mg 1 daily x 1 day. Tremor: Start primidone 50 mg nightly.  Diabetes: Hold glipizide while on Cipro as this can increase hypoglycemic episodes (drops in sugars.) When you restart take before largest meal of the day.

## 2022-09-23 ENCOUNTER — Encounter: Payer: Self-pay | Admitting: Family Medicine

## 2022-09-25 LAB — URINE CULTURE

## 2022-10-02 ENCOUNTER — Encounter: Payer: Medicare Other | Admitting: Cardiology

## 2022-10-02 DIAGNOSIS — E1122 Type 2 diabetes mellitus with diabetic chronic kidney disease: Secondary | ICD-10-CM | POA: Diagnosis not present

## 2022-10-02 DIAGNOSIS — I5089 Other heart failure: Secondary | ICD-10-CM | POA: Diagnosis not present

## 2022-10-02 DIAGNOSIS — N1832 Chronic kidney disease, stage 3b: Secondary | ICD-10-CM | POA: Diagnosis not present

## 2022-10-02 DIAGNOSIS — N2581 Secondary hyperparathyroidism of renal origin: Secondary | ICD-10-CM | POA: Diagnosis not present

## 2022-10-02 DIAGNOSIS — I130 Hypertensive heart and chronic kidney disease with heart failure and stage 1 through stage 4 chronic kidney disease, or unspecified chronic kidney disease: Secondary | ICD-10-CM | POA: Diagnosis not present

## 2022-10-02 DIAGNOSIS — M800AXD Age-related osteoporosis with current pathological fracture, other site, subsequent encounter for fracture with routine healing: Secondary | ICD-10-CM | POA: Diagnosis not present

## 2022-10-06 ENCOUNTER — Other Ambulatory Visit: Payer: Self-pay | Admitting: Family Medicine

## 2022-10-09 DIAGNOSIS — N1832 Chronic kidney disease, stage 3b: Secondary | ICD-10-CM | POA: Diagnosis not present

## 2022-10-09 DIAGNOSIS — E1122 Type 2 diabetes mellitus with diabetic chronic kidney disease: Secondary | ICD-10-CM | POA: Diagnosis not present

## 2022-10-09 DIAGNOSIS — I130 Hypertensive heart and chronic kidney disease with heart failure and stage 1 through stage 4 chronic kidney disease, or unspecified chronic kidney disease: Secondary | ICD-10-CM | POA: Diagnosis not present

## 2022-10-09 DIAGNOSIS — I5089 Other heart failure: Secondary | ICD-10-CM | POA: Diagnosis not present

## 2022-10-09 DIAGNOSIS — M800AXD Age-related osteoporosis with current pathological fracture, other site, subsequent encounter for fracture with routine healing: Secondary | ICD-10-CM | POA: Diagnosis not present

## 2022-10-09 DIAGNOSIS — N2581 Secondary hyperparathyroidism of renal origin: Secondary | ICD-10-CM | POA: Diagnosis not present

## 2022-10-10 ENCOUNTER — Other Ambulatory Visit: Payer: Self-pay | Admitting: Family Medicine

## 2022-10-10 ENCOUNTER — Other Ambulatory Visit: Payer: Self-pay

## 2022-10-10 MED ORDER — LEVOTHYROXINE SODIUM 125 MCG PO TABS: 125.0000 ug | ORAL_TABLET | Freq: Every day | ORAL | 1 refills | Status: DC

## 2022-10-11 NOTE — Progress Notes (Signed)
Remote pacemaker transmission.   

## 2022-10-12 ENCOUNTER — Telehealth: Payer: Self-pay

## 2022-10-12 DIAGNOSIS — E1159 Type 2 diabetes mellitus with other circulatory complications: Secondary | ICD-10-CM

## 2022-10-12 NOTE — Telephone Encounter (Signed)
Patient called stating that he thought about the Ozempic and he states he will take the Ozempic INSTEAD of the Toujeo. He states he will try it and wants it sent to prevo, but wants to stop the Toujeo to take the Ozempic. Please advise.

## 2022-10-13 NOTE — Telephone Encounter (Signed)
Left message to return call back. 

## 2022-10-16 ENCOUNTER — Ambulatory Visit: Payer: Medicare Other | Attending: Cardiology | Admitting: Cardiology

## 2022-10-16 ENCOUNTER — Encounter: Payer: Self-pay | Admitting: Cardiology

## 2022-10-16 VITALS — BP 110/58 | HR 60 | Ht 75.0 in | Wt 261.0 lb

## 2022-10-16 DIAGNOSIS — G4733 Obstructive sleep apnea (adult) (pediatric): Secondary | ICD-10-CM | POA: Diagnosis not present

## 2022-10-16 DIAGNOSIS — I495 Sick sinus syndrome: Secondary | ICD-10-CM

## 2022-10-16 DIAGNOSIS — I4819 Other persistent atrial fibrillation: Secondary | ICD-10-CM

## 2022-10-16 DIAGNOSIS — D6869 Other thrombophilia: Secondary | ICD-10-CM | POA: Diagnosis not present

## 2022-10-16 DIAGNOSIS — I10 Essential (primary) hypertension: Secondary | ICD-10-CM

## 2022-10-16 DIAGNOSIS — Z79899 Other long term (current) drug therapy: Secondary | ICD-10-CM

## 2022-10-16 MED ORDER — OZEMPIC (0.25 OR 0.5 MG/DOSE) 2 MG/3ML ~~LOC~~ SOPN: 0.2500 mg | PEN_INJECTOR | SUBCUTANEOUS | 0 refills | Status: DC

## 2022-10-16 NOTE — Patient Instructions (Signed)
Medication Instructions:  Your physician recommends that you continue on your current medications as directed. Please refer to the Current Medication list given to you today.  *If you need a refill on your cardiac medications before your next appointment, please call your pharmacy*   Lab Work: None ordered   Testing/Procedures: None ordered   Follow-Up: At [facility], you and your health needs are our priority.  As part of our continuing mission to provide you with exceptional heart care, we have created designated Provider Care Teams.  These Care Teams include your primary Cardiologist (physician) and Advanced Practice Providers (APPs -  Physician Assistants and Nurse Practitioners) who all work together to provide you with the care you need, when you need it.  Remote monitoring is used to monitor your Pacemaker or ICD from home. This monitoring reduces the number of office visits required to check your device to one time per year. It allows [place] to keep an eye on the functioning of your device to ensure it is working properly. You are scheduled for a device check from home on 12/07/2022. You may send your transmission at any time that day. If you have a wireless device, the transmission will be sent automatically. After your physician reviews your transmission, you will receive a postcard with your next transmission date.  Your next appointment:   6 month(s)  The format for your next appointment:   In Person  Provider:   Loman Brooklyn, MD    Thank you for choosing [facility]!!   Dory Horn, RN [phone]

## 2022-10-16 NOTE — Telephone Encounter (Signed)
Patient informed and new script sent to pharmacy and changed rx to 20 units on Toujeo in patient's chart.

## 2022-10-16 NOTE — Progress Notes (Signed)
Electrophysiology Office Note   Date:  10/16/2022   ID:  Paul, Kent 22-Feb-1941, MRN [id number]  PCP:  Blane Ohara, MD  Cardiologist:  Primary Electrophysiologist:  Glynnis Gavel Paul Loa, MD    No chief complaint on file.     History of Present Illness: Paul Kent is a 82 y.o. male who is being seen today for the evaluation of atrial fibrillation at the request of Cox, Kirsten, MD. Presenting today for electrophysiology evaluation.    He has a history of significant persistent atrial fibrillation, hypertension, hyperlipidemia, OSA, type 2 diabetes, sick sinus syndrome post Biotronik dual-chamber pacemaker.  He had a cryoablation in 2019 with RF ablation 05/21/2020.  He continued to have episodes of atrial fibrillation and atrial flutter.  He remains on amiodarone.  Today, denies symptoms of palpitations, chest pain, shortness of breath, orthopnea, PND, lower extremity edema, claudication, dizziness, presyncope, syncope, bleeding, or neurologic sequela. The patient is tolerating medications without difficulties.  He is feeling well today.  No chest pain.  He unfortunately had a fall and fractured his pelvis.  He is using a walker.  He did not require an operation.   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) 04/26/2021   Corns and callosities    Deviated septum 07/01/2018   Deviated septum 07/01/2018   Diabetes mellitus due to underlying condition with unspecified complications (HCC) 06/08/2016   Diabetic ulcer of right heel associated with diabetes mellitus due  to underlying condition, limited to breakdown of skin (HCC) 07/31/2021   Disturbances of salivary secretion    Epistaxis    Essential hypertension    Essential tremor    H pylori ulcer    H pylori ulcer    Herpes zoster with nervous system complication 06/03/2013   Hesitancy of micturition    Hyperlipidemia    Hypothyroidism    Impacted cerumen, bilateral    Jaw pain    Lesion of femoral nerve 04/30/2013   Localized edema    Male erectile disorder    Malignant 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)    Nasal congestion 07/01/2018   Nasal turbinate hypertrophy 07/01/2018   Obstructive sleep apnea 01/17/2017   Other constipation    Other fatigue    Overweight    Pacemaker reprogramming/check 02/12/2017   Pain in right finger(s)    Pain in right lower leg    Paroxysmal atrial fibrillation (HCC) 06/08/2016   Peptic ulcer disease    Persistent atrial fibrillation (HCC) 06/01/2017   Primary insomnia    Renal stones    Renal stones    Secondary hypercoagulable state (HCC) 06/17/2020   Shingles 2014   WITH COMPLICATIONS (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)   Shingles 06/2012   Shortness of breath    Sick sinus syndrome (HCC)    Sinus bradycardia 03/07/2017   Sleep apnea    dx 08/2015, CPAP   Spontaneous ecchymoses    Status post ablation of atrial fibrillation 09/16/2020   Testicular hypofunction  Thoracic or lumbosacral neuritis or radiculitis 06/03/2013   Trigger middle finger of right hand 11/28/2019   Trigger middle finger of right hand 11/28/2019   Type 2 diabetes mellitus with circulatory disorder, 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   Past Surgical History:  Procedure Laterality Date   ABLATION  06/2017   ATRIAL FIBRILLATION ABLATION N/A 05/21/2020   Procedure: ATRIAL FIBRILLATION ABLATION;  Surgeon: Regan Lemming, MD;  Location: [facility] INVASIVE CV [facility];  Service: Cardiovascular;  Laterality: N/A;    CARDIOVERSION N/A 03/22/2018   Procedure: CARDIOVERSION;  Surgeon: Jodelle Red, MD;  Location: [facility] ENDOSCOPY;  Service: Cardiovascular;  Laterality: N/A;   CARDIOVERSION N/A 07/08/2020   Procedure: CARDIOVERSION;  Surgeon: Jake Bathe, MD;  Location: [facility] ENDOSCOPY;  Service: Cardiovascular;  Laterality: N/A;   HEMIARTHROPLASTY HIP  10/31/2013   IT HIP BIPOLAR   LUMBAR SPINE SURGERY     PACEMAKER PLACEMENT  01/29/2017   SHOULDER SURGERY Right      Current Outpatient Medications  Medication Sig Dispense Refill   amiodarone (PACERONE) 200 MG tablet TAKE ONE TABLET BY MOUTH EVERY DAY 90 tablet 1   atorvastatin (LIPITOR) 10 MG tablet TAKE ONE TABLET BY MOUTH EVERY EVENING 90 tablet 1   B-D UF III MINI PEN NEEDLES 31G X 5 MM MISC USE AS DIRECTED EVERY DAY to inject insulin 100 each 1   carvedilol (COREG) 12.5 MG tablet Take 1 tablet (12.5 mg total) by mouth 2 (two) times daily. 180 tablet 3   Continuous Blood Gluc Sensor (DEXCOM G7 SENSOR) MISC To use as directed to monitor glucose 3 each 3   famotidine (PEPCID) 40 MG tablet Take 1 tablet (40 mg total) by mouth daily. 90 tablet 1   Finerenone (KERENDIA) 10 MG TABS Take 1 tablet (10 mg total) by mouth daily. 30 tablet 2   furosemide (LASIX) 40 MG tablet Take 1 tablet (40 mg total) by mouth daily. 30 tablet 1   gabapentin (NEURONTIN) 300 MG capsule Take 1 capsule (300 mg total) by mouth 2 (two) times daily. 180 capsule 1   glipiZIDE (GLUCOTROL XL) 10 MG 24 hr tablet Take 1 tablet (10 mg total) by mouth daily. 1 tablet 0   insulin glargine, 1 Unit Dial, (TOUJEO SOLOSTAR) 300 UNIT/ML Solostar Pen Inject 24 Units into the skin daily at 6 (six) AM. (Patient taking differently: Inject 20 Units into the skin daily at 6 (six) AM.) 4.5 mL 1   levothyroxine (SYNTHROID) 125 MCG tablet Take 1 tablet (125 mcg total) by mouth daily before breakfast. 90 tablet 1   primidone (MYSOLINE) 50 MG tablet Take 1 tablet (50 mg total) by mouth at bedtime. 30  tablet 0   Semaglutide,0.25 or 0.5MG /DOS, (OZEMPIC, 0.25 OR 0.5 MG/DOSE,) 2 MG/3ML SOPN Inject 0.25 mg into the skin once a week. 3 mL 0   tamsulosin (FLOMAX) 0.4 MG CAPS capsule Take 1 capsule (0.4 mg total) by mouth daily after supper. 30 capsule 1   testosterone cypionate (DEPOTESTOSTERONE CYPIONATE) 200 MG/ML injection INJECT 200MG  (1 MILLILITER) into the muscle EVERY 14 DAYS 10 mL 0   XARELTO 20 MG TABS tablet TAKE ONE TABLET BY MOUTH EVERY EVENING 90 tablet 1   No current facility-administered medications for this visit.    Allergies:   Propranolol, Canagliflozin, Clonidine derivatives, Hydralazine hcl, Metformin and related, Topiramate, Ambien [zolpidem], Farxiga [dapagliflozin], and Ozempic (0.25 or 0.5 mg-dose) [semaglutide(0.25 or 0.5mg -dos)]   Social History:  The patient  reports that he has never  smoked. He has never used smokeless tobacco. He reports that he does not currently use alcohol. He reports that he does not use drugs.   Family History:  The patient's family history includes Arthritis in an other family member; Congestive Heart Failure in an other family member; Diabetes type II in an other family member; Heart Problems in his father; Heart attack in his mother; Hyperlipidemia in an other family member; Transient ischemic attack in his mother; Tuberculosis in his brother.   ROS:  Please see the history of present illness.   Otherwise, review of systems is positive for none.   All other systems are reviewed and negative.   PHYSICAL EXAM: VS:  BP (!) 110/58   Pulse 60   Ht 6\' 3"  (1.905 m)   Wt 261 lb (118.4 kg)   SpO2 97%   BMI 32.62 kg/m  , BMI Body mass index is 32.62 kg/m. GEN: Well nourished, well developed, in no acute distress  HEENT: normal  Neck: no JVD, carotid bruits, or masses Cardiac: RRR; no murmurs, rubs, or gallops,no edema  Respiratory:  clear to auscultation bilaterally, normal work of breathing GI: soft, nontender, nondistended, + BS MS: no  deformity or atrophy  Skin: warm and dry, device site well healed Neuro:  Strength and sensation are intact Psych: euthymic mood, full affect  EKG:  EKG is ordered today. Personal review of the ekg ordered shows AV paced  Personal review of the device interrogation today. Results in [facility]   Recent Labs: 05/10/2022: NT-Pro BNP 1,268 08/23/2022: ALT 14; BUN 23; Creatinine, Ser 1.43; Hemoglobin 12.2; Platelets 228; Potassium 4.9; Sodium 139; TSH 7.530    Lipid Panel     Component Value Date/Time   CHOL 129 07/19/2022 0904   TRIG 56 07/19/2022 0904   HDL 55 07/19/2022 0904   CHOLHDL 2.3 07/19/2022 0904   LDLCALC 62 07/19/2022 0904     Wt Readings from Last 3 Encounters:  10/16/22 261 lb (118.4 kg)  09/22/22 251 lb (113.9 kg)  08/23/22 240 lb (108.9 kg)      Other studies Reviewed: Additional studies/ records that were reviewed today include: TTE 03/01/18  Review of the above records today demonstrates:  - Left ventricle: The cavity size was normal. Wall thickness was   increased in a pattern of mild LVH. Systolic function was mildly   reduced. The estimated ejection fraction was in the range of 45%   to 50%. Diffuse hypokinesis. - Left atrium: The atrium was mildly dilated. Volume/bsa, S: 37.6   ml/m^2. - Right ventricle: The cavity size was moderately dilated. Wall   thickness was normal. Pacer wire or catheter noted in right   ventricle.   ASSESSMENT AND PLAN:  1.  Persistent atrial fibrillation: Currently on Xarelto and amiodarone.  Post cryoablation in 2019, RF ablation 05/21/2020.  CHA2DS2-VASc of 4.  Has continued to have episodes of atrial fibrillation.  Continue with current management.  2.  Hypertension: Well controlled  3.  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  Device functioning appropriately.  No changes.  4.  Obstructive sleep apnea: CPAP compliance encouraged  5.  Secondary hypercoagulable state:  Currently on Xarelto for atrial fibrillation  6.  High risk medication monitoring: Currently on amiodarone.  TSH mildly elevated.  Fabianna Keats need follow-up with primary physician.    Current medicines are reviewed at length with the patient today.   The patient does not have concerns regarding his medicines.  The following changes were  made today: none  Labs/ tests ordered today include:  Orders Placed This Encounter  Procedures   EKG 12-Lead     Disposition:   FU 6 months  Signed, Dericka Ostenson Paul Loa, MD  10/16/2022 3:20 PM     [facility] [address] [place] [address] [phone] (office) [phone] (fax)

## 2022-10-17 LAB — CUP PACEART INCLINIC DEVICE CHECK
Date Time Interrogation Session: 20240429073142
Implantable Lead Connection Status: 753985
Implantable Lead Connection Status: 753985
Implantable Lead Implant Date: 20180813
Implantable Lead Implant Date: 20180813
Implantable Lead Location: 753859
Implantable Lead Location: 753860
Implantable Lead Model: 377
Implantable Lead Model: 377
Implantable Lead Serial Number: 49893169
Implantable Lead Serial Number: 50011411
Implantable Pulse Generator Implant Date: 20180813
Pulse Gen Model: 407145
Pulse Gen Serial Number: 69158272

## 2022-10-21 ENCOUNTER — Other Ambulatory Visit: Payer: Self-pay | Admitting: Family Medicine

## 2022-10-21 DIAGNOSIS — E1142 Type 2 diabetes mellitus with diabetic polyneuropathy: Secondary | ICD-10-CM

## 2022-10-25 ENCOUNTER — Other Ambulatory Visit: Payer: Self-pay | Admitting: Family Medicine

## 2022-10-25 DIAGNOSIS — G250 Essential tremor: Secondary | ICD-10-CM

## 2022-10-26 ENCOUNTER — Ambulatory Visit (INDEPENDENT_AMBULATORY_CARE_PROVIDER_SITE_OTHER): Payer: Medicare Other | Admitting: Family Medicine

## 2022-10-26 VITALS — BP 110/60 | HR 72 | Temp 97.1°F | Resp 16 | Ht 75.0 in | Wt 254.0 lb

## 2022-10-26 DIAGNOSIS — G250 Essential tremor: Secondary | ICD-10-CM | POA: Diagnosis not present

## 2022-10-26 DIAGNOSIS — E1159 Type 2 diabetes mellitus with other circulatory complications: Secondary | ICD-10-CM | POA: Diagnosis not present

## 2022-10-26 DIAGNOSIS — N1832 Chronic kidney disease, stage 3b: Secondary | ICD-10-CM | POA: Diagnosis not present

## 2022-10-26 DIAGNOSIS — E039 Hypothyroidism, unspecified: Secondary | ICD-10-CM | POA: Diagnosis not present

## 2022-10-26 DIAGNOSIS — E1121 Type 2 diabetes mellitus with diabetic nephropathy: Secondary | ICD-10-CM | POA: Diagnosis not present

## 2022-10-26 DIAGNOSIS — I509 Heart failure, unspecified: Secondary | ICD-10-CM | POA: Diagnosis not present

## 2022-10-26 DIAGNOSIS — I130 Hypertensive heart and chronic kidney disease with heart failure and stage 1 through stage 4 chronic kidney disease, or unspecified chronic kidney disease: Secondary | ICD-10-CM | POA: Diagnosis not present

## 2022-10-26 DIAGNOSIS — Z950 Presence of cardiac pacemaker: Secondary | ICD-10-CM | POA: Diagnosis not present

## 2022-10-26 DIAGNOSIS — E782 Mixed hyperlipidemia: Secondary | ICD-10-CM | POA: Diagnosis not present

## 2022-10-26 MED ORDER — PRIMIDONE 50 MG PO TABS: 50.0000 mg | ORAL_TABLET | Freq: Two times a day (BID) | ORAL | 3 refills | Status: DC

## 2022-10-26 NOTE — Progress Notes (Signed)
Subjective:  Patient ID: Paul Kent, male    DOB: 19-Sep-1940  Age: 82 y.o. MRN: [id number]  Chief Complaint  Patient presents with   Medical Management of Chronic Issues    HPI Diabetes:  Complications: neuropathy Glucose checking:once daily in am.  Glucose logs: 85-115 Hypoglycemia: in the middle of the night due to symptoms consistent with hypoglycemia.  Most recent A1C: 7.1% Current medications: Glipizide 10 mg twice a day, farxiga 10 mg daily, and insulin glargine 20 U before bed. . Last Eye Exam: 01/16/2022. Foot checks: wound care released.   Hyperlipidemia: Current medications: Atorvastatin 10 mg daily.  Hypertension: Current medications: Carvedilol 12.5 mg twice a day.  GERD: Famotidine 40 mg daily.  Afib: Taking Amiodarone 200 mg everyday, carvedilol 12.5 mg twice daily. xarelto 20 mg every evening.  Hypothyroidism: He takes Levothyroxine 100 mcg every morning.  Diet: eating healthy.  Exercise: still going to gym.  Patient is having urinary hesitancy. No nocturia. No dysuria.   Chronic respiratory failure: Wears oxygen 2 L at night.  Hypogonadism: has testosterone shots, but has not had in a couple of months.      10/26/2022    2:35 PM 02/13/2022    9:10 AM 11/24/2021    9:00 AM 04/06/2021   10:56 AM 01/20/2021    8:51 AM  Depression screen PHQ 2/9  Decreased Interest 0 0 0 0 0  Down, Depressed, Hopeless 0 0 0 0 0  PHQ - 2 Score 0 0 0 0 0  Altered sleeping 0      Tired, decreased energy 0      Change in appetite 0      Feeling bad or failure about yourself  0      Trouble concentrating 0      Moving slowly or fidgety/restless 0      Suicidal thoughts 0      PHQ-9 Score 0      Difficult doing work/chores Not difficult at all            10/26/2022    2:35 PM  Fall Risk   Falls in the past year? 0  Number falls in past yr: 0  Injury with Fall? 0  Risk for fall due to : History of fall(s)  Follow up Falls evaluation completed;Falls prevention  discussed    Patient Care Team: Blane Ohara, MD as PCP - General (Internal Medicine) Regan Lemming, MD as PCP - Electrophysiology (Cardiology) Debroah Baller, MD as Consulting Physician (Urology) Ulice Brilliant, MD (Orthopedic Surgery) Cherly Beach, MD as Referring Physician (Family Medicine) Evelena Leyden, OD (Optometry) Zettie Pho, [facility] as Pharmacist (Pharmacist)   Review of Systems  Constitutional:  Negative for chills, fatigue and fever.  HENT:  Positive for rhinorrhea. Negative for congestion, ear pain and sore throat.   Respiratory:  Negative for cough and shortness of breath.   Cardiovascular:  Negative for chest pain and palpitations.  Gastrointestinal:  Negative for abdominal pain, constipation, diarrhea, nausea and vomiting.  Endocrine: Negative for polydipsia, polyphagia and polyuria.  Genitourinary:  Negative for dysuria and frequency.  Musculoskeletal:  Negative for arthralgias and myalgias.  Neurological:  Negative for dizziness and headaches.  Psychiatric/Behavioral:  Negative for dysphoric mood.        No dysphoria    Current Outpatient Medications on File Prior to Visit  Medication Sig Dispense Refill   amiodarone (PACERONE) 200 MG tablet TAKE ONE TABLET BY MOUTH EVERY DAY 90 tablet 1  atorvastatin (LIPITOR) 10 MG tablet TAKE ONE TABLET BY MOUTH EVERY EVENING 90 tablet 1   B-D UF III MINI PEN NEEDLES 31G X 5 MM MISC USE AS DIRECTED EVERY DAY to inject insulin 100 each 1   carvedilol (COREG) 12.5 MG tablet Take 1 tablet (12.5 mg total) by mouth 2 (two) times daily. 180 tablet 3   Continuous Blood Gluc Sensor (DEXCOM G7 SENSOR) MISC To use as directed to monitor glucose 3 each 3   famotidine (PEPCID) 40 MG tablet Take 1 tablet (40 mg total) by mouth daily. 90 tablet 1   furosemide (LASIX) 40 MG tablet Take 1 tablet (40 mg total) by mouth daily. 30 tablet 1   gabapentin (NEURONTIN) 300 MG capsule Take 1 capsule (300 mg total) by mouth 2 (two) times  daily. 180 capsule 1   glipiZIDE (GLUCOTROL XL) 10 MG 24 hr tablet TAKE ONE TABLET BY MOUTH TWICE DAILY 180 tablet 1   insulin glargine, 1 Unit Dial, (TOUJEO SOLOSTAR) 300 UNIT/ML Solostar Pen Inject 24 Units into the skin daily at 6 (six) AM. (Patient taking differently: Inject 20 Units into the skin daily at 6 (six) AM.) 4.5 mL 1   KERENDIA 10 MG TABS Take 1 tablet (10 mg total) by mouth daily. 30 tablet 2   levothyroxine (SYNTHROID) 125 MCG tablet Take 1 tablet (125 mcg total) by mouth daily before breakfast. 90 tablet 1   primidone (MYSOLINE) 50 MG tablet Take 1 tablet (50 mg total) by mouth in the morning and at bedtime. 180 tablet 0   Semaglutide,0.25 or 0.5MG /DOS, (OZEMPIC, 0.25 OR 0.5 MG/DOSE,) 2 MG/3ML SOPN Inject 0.25 mg into the skin once a week. 3 mL 0   tamsulosin (FLOMAX) 0.4 MG CAPS capsule Take 1 capsule (0.4 mg total) by mouth daily after supper. 30 capsule 1   testosterone cypionate (DEPOTESTOSTERONE CYPIONATE) 200 MG/ML injection INJECT 200MG  (1 MILLILITER) into the muscle EVERY 14 DAYS 10 mL 0   XARELTO 20 MG TABS tablet TAKE ONE TABLET BY MOUTH EVERY EVENING 90 tablet 1   No current facility-administered medications on file prior to visit.   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) 04/26/2021   Corns and callosities    Deviated septum 07/01/2018   Deviated septum 07/01/2018   Diabetes mellitus due to underlying condition with unspecified complications (HCC) 06/08/2016   Diabetic ulcer of right heel associated with diabetes mellitus due to underlying condition,  limited to breakdown of skin (HCC) 07/31/2021   Disturbances of salivary secretion    Epistaxis    Essential hypertension    Essential tremor    H pylori ulcer    H pylori ulcer    Herpes zoster with nervous system complication 06/03/2013   Hesitancy of micturition    Hyperlipidemia    Hypothyroidism    Impacted cerumen, bilateral    Jaw pain    Lesion of femoral nerve 04/30/2013   Localized edema    Male erectile disorder    Malignant neoplasm of overlapping sites  of bladder (HCC)    Nasal congestion 07/01/2018   Nasal turbinate hypertrophy 07/01/2018   Obstructive sleep apnea 01/17/2017   Other constipation    Other fatigue    Overweight    Pacemaker reprogramming/check 02/12/2017   Pain in right finger(s)    Pain in right lower leg    Paroxysmal atrial fibrillation (HCC) 06/08/2016   Peptic ulcer disease    Persistent atrial fibrillation (HCC) 06/01/2017   Primary insomnia    Renal stones    Renal stones    Secondary hypercoagulable state (HCC) 06/17/2020   Shingles 2014   WITH COMPLICATIONS (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)   Shingles 06/2012   Shortness of breath    Sick sinus syndrome (HCC)    Sinus bradycardia 03/07/2017   Sleep apnea    dx 08/2015, CPAP   Spontaneous ecchymoses    Status post ablation of atrial fibrillation 09/16/2020   Testicular hypofunction    Thoracic or lumbosacral neuritis or radiculitis 06/03/2013   Trigger middle finger of right hand 11/28/2019   Trigger middle finger of right hand 11/28/2019   Type 2 diabetes mellitus with circulatory disorder, 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   Past Surgical History:  Procedure Laterality Date   ABLATION  06/2017   ATRIAL FIBRILLATION ABLATION N/A 05/21/2020   Procedure: ATRIAL FIBRILLATION ABLATION;  Surgeon: Regan Lemming, MD;  Location: [facility] INVASIVE CV LAB;  Service: Cardiovascular;  Laterality: N/A;   CARDIOVERSION N/A 03/22/2018    Procedure: CARDIOVERSION;  Surgeon: Jodelle Red, MD;  Location: [facility] ENDOSCOPY;  Service: Cardiovascular;  Laterality: N/A;   CARDIOVERSION N/A 07/08/2020   Procedure: CARDIOVERSION;  Surgeon: Jake Bathe, MD;  Location: [facility] ENDOSCOPY;  Service: Cardiovascular;  Laterality: N/A;   HEMIARTHROPLASTY HIP  10/31/2013   IT HIP BIPOLAR   LUMBAR SPINE SURGERY     PACEMAKER PLACEMENT  01/29/2017   SHOULDER SURGERY Right     Family History  Problem Relation Age of Onset   Heart attack Mother    Transient ischemic attack Mother    Heart Problems Father    Tuberculosis Brother    Diabetes type II Other    Hyperlipidemia Other    Congestive Heart Failure Other    Arthritis Other    Social History   Socioeconomic History   Marital status: Married    Spouse name: Kennon Rounds   Number of children: 3   Years of education: Not on file   Highest education level: Not on file  Occupational History   Occupation: RETIRED    Comment: school principal  Tobacco Use   Smoking status: Never   Smokeless tobacco: Never  Vaping Use   Vaping Use: Never used  Substance and Sexual Activity   Alcohol use: Not Currently   Drug use: Never   Sexual activity: Not on file  Other Topics Concern   Not on file  Social History Narrative   Lives with wife   Social Determinants of Health   Financial Resource Strain: Low Risk  (11/24/2021)   Overall Financial Resource Strain (CARDIA)    Difficulty of Paying Living Expenses: Not hard at all  Food Insecurity: No Food Insecurity (11/24/2021)   Hunger Vital Sign    Worried About Running Out of Food in the Last Year: Never true    Ran Out of Food in the Last Year: Never true  Transportation Needs: No Transportation Needs (11/24/2021)   PRAPARE - Transportation    Lack of  Transportation (Medical): No    Lack of Transportation (Non-Medical): No  Physical Activity: Sufficiently Active (11/24/2021)   Exercise Vital Sign    Days of Exercise per Week: 4 days     Minutes of Exercise per Session: 60 min  Stress: No Stress Concern Present (11/24/2021)   [employer] of Occupational Health - Occupational Stress Questionnaire    Feeling of Stress : Not at all  Social Connections: Socially Integrated (11/24/2021)   Social Connection and Isolation Panel [NHANES]    Frequency of Communication with Friends and Family: More than three times a week    Frequency of Social Gatherings with Friends and Family: More than three times a week    Attends Religious Services: More than 4 times per year    Active Member of [employer] or Organizations: Yes    Attends Engineer, structural: More than 4 times per year    Marital Status: Married    Objective:  BP 110/60   Pulse 72   Temp (!) 97.1 F (36.2 C)   Resp 16   Ht 6\' 3"  (1.905 m)   Wt 254 lb (115.2 kg)   BMI 31.75 kg/m      10/26/2022    2:25 PM 10/16/2022    3:03 PM 09/22/2022    7:26 AM  BP/Weight  Systolic BP 110 110 120  Diastolic BP 60 58 72  Wt. (Lbs) 254 261 251  BMI 31.75 kg/m2 32.62 kg/m2 31.37 kg/m2    Physical Exam Vitals reviewed.  Constitutional:      Appearance: Normal appearance.  Neck:     Vascular: No carotid bruit.  Cardiovascular:     Rate and Rhythm: Normal rate and regular rhythm.     Pulses: Normal pulses.     Heart sounds: Normal heart sounds.  Pulmonary:     Effort: Pulmonary effort is normal.     Breath sounds: Normal breath sounds. No wheezing, rhonchi or rales.  Abdominal:     General: Bowel sounds are normal.     Palpations: Abdomen is soft.     Tenderness: There is no abdominal tenderness.  Neurological:     Mental Status: He is alert.     Gait: Gait abnormal (using a cane).  Psychiatric:        Mood and Affect: Mood normal.        Behavior: Behavior normal.     Diabetic Foot Exam - Simple   Simple Foot Form Diabetic Foot exam was performed with the following findings: Yes 10/26/2022  3:12 PM  Visual Inspection See comments: Yes Sensation  Testing See comments: Yes Pulse Check Posterior Tibialis and Dorsalis pulse intact bilaterally: Yes Comments Decreased sensation bilaterally. Claw toes all digits bilaterally. Dry skin on right heel.       Lab Results  Component Value Date   WBC 8.2 08/23/2022   HGB 12.2 (L) 08/23/2022   HCT 37.2 (L) 08/23/2022   PLT 228 08/23/2022   GLUCOSE 149 (H) 08/23/2022   CHOL 129 07/19/2022   TRIG 56 07/19/2022   HDL 55 07/19/2022   LDLCALC 62 07/19/2022   ALT 14 08/23/2022   AST 20 08/23/2022   NA 139 08/23/2022   K 4.9 08/23/2022   CL 104 08/23/2022   CREATININE 1.43 (H) 08/23/2022   BUN 23 08/23/2022   CO2 20 08/23/2022   TSH 7.530 (H) 08/23/2022   HGBA1C 7.3 (H) 07/19/2022   MICROALBUR 80 01/20/2021      Assessment & Plan:  Type 2 diabetes mellitus with other circulatory complication, 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HCC) -     Hemoglobin A1c; Future -     Microalbumin / creatinine urine ratio  Acquired hypothyroidism Assessment & Plan: Check tsh and free T4. Patient to return for fasting lab.   Orders: -     T4, free; Future -     TSH; Future  Hypertensive heart and renal disease, stage 1-4 or unspecified chronic kidney disease, with heart failure (HCC) Assessment & Plan: Stable. Continue carvedilol 12.5 mg twice daily.   Orders: -     CBC with Differential/Platelet; Future -     Comprehensive metabolic panel; Future  Mixed hyperlipidemia Assessment & Plan: Well controlled.  No changes to medicines. Continue Atorvastatin 10 mg daily. Continue to work on eating a healthy diet and exercise.  Labs drawn today.    Orders: -     Lipid panel; Future  Chronic kidney disease, stage 3b (HCC) Assessment & Plan:  Stable.   Cardiac pacemaker in situ Assessment & Plan: Management per specialist.     Essential tremor Assessment & Plan: Increase primidone to 50 mg twice daily.   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) Assessment & Plan: Control:  poor. Not at goal Recommend check sugars fasting daily. Recommend check feet daily. Recommend annual eye exams. Medicines:  Ozempic 0.25 mg weekly x 4 weeks then increase 0.5 mg once weekly.  Call when gone and we can increase to 1 mg weekly.  Recommend hold glipizide if sugars start to drop.  Continue farxiga 10 mg daily, and insulin glargine 20 U before bed. . Continue to work on eating a healthy diet and exercise.  Labs to be drawn next week.     Other orders -     Primidone; Take 1 tablet (50 mg total) by mouth 2 (two) times daily.  Dispense: 180 tablet; Refill: 3     Meds ordered this encounter  Medications   primidone (MYSOLINE) 50 MG tablet    Sig: Take 1 tablet (50 mg total) by mouth 2 (two) times daily.    Dispense:  180 tablet    Refill:  3    Orders Placed This Encounter  Procedures   CBC with Differential/Platelet   Comprehensive metabolic panel   Hemoglobin A1c   Lipid panel   T4, free   TSH   Microalbumin / creatinine urine ratio     Follow-up: Return in about 1 day (around 10/27/2022) for lab visit, fasting. then 3 month fasting. Clayborn Bigness I Leal-Borjas,acting as a scribe for Blane Ohara, MD.,have documented all relevant documentation on the behalf of Blane Ohara, MD,as directed by  Blane Ohara, MD while in the presence of Blane Ohara, MD.   An After Visit Summary was printed and given to the patient.  Blane Ohara, MD [facility] [phone]

## 2022-10-26 NOTE — Patient Instructions (Addendum)
Ozempic 0.25 mg weekly x 4 weeks then increase 0.5 mg once weekly.  Call when gone and we can increase to 1 mg weekly.   Recommend hold glipizide if sugars start to drop.

## 2022-10-26 NOTE — Progress Notes (Signed)
Subjective:  Patient ID: Paul Kent, male    DOB: 1941/04/12  Age: 82 y.o. MRN: [id number]  No chief complaint on file.   HPI     Diabetes:  Complications: neuropathy Glucose checking:once daily in am.  Glucose logs: 105-130 Hypoglycemia: in the middle of the night sugar dropped to 74.   Most recent A1C: 7.1% Current medications: Glipizide 10 mg twice a day, farxiga 10 mg daily, and insulin glargine 20 U before bed. . Last Eye Exam: 01/16/2022. Foot checks: wound care visits weekly have been completed. Spraying "tuff skin".on feet. No open wounds.     Hyperlipidemia: Current medications: Atorvastatin 10 mg daily.  Hypertension: Current medications: Carvedilol 12.5 mg twice a day.  GERD: Famotidine 40 mg daily.  Afib: Taking Amiodarone 200 mg everyday, carvedilol 12.5 mg twice daily. xarelto 20 mg every evening.  Hypothyroidism: He takes Levothyroxine 100 mcg every morning.  Diet: eating healthy. Weight is down 7 lbs.   Exercise: his eye issue has prevented him from going to the gym.    Patient is having urinary hesitancy. No nocturia. No dysuria.        02/13/2022    9:10 AM 11/24/2021    9:00 AM 04/06/2021   10:56 AM 01/20/2021    8:51 AM 11/16/2020    9:21 AM  Depression screen PHQ 2/9  Decreased Interest 0 0 0 0 0  Down, Depressed, Hopeless 0 0 0 0 0  PHQ - 2 Score 0 0 0 0 0        07/19/2022    8:23 AM  Fall Risk   Falls in the past year? 1  Number falls in past yr: 0  Injury with Fall? 0  Risk for fall due to : Impaired balance/gait  Follow up Falls evaluation completed;Education provided    Patient Care Team: Blane Ohara, MD as PCP - General (Internal Medicine) Regan Lemming, MD as PCP - Electrophysiology (Cardiology) Debroah Baller, MD as Consulting Physician (Urology) Ulice Brilliant, MD (Orthopedic Surgery) Cherly Beach, MD as Referring Physician (Family Medicine) Evelena Leyden, OD (Optometry) Zettie Pho, [facility] as  Pharmacist (Pharmacist)   Review of Systems  Constitutional:  Negative for chills and fever.  HENT:  Negative for congestion, rhinorrhea and sore throat.   Eyes:  Positive for visual disturbance.  Respiratory:  Negative for cough and shortness of breath.   Cardiovascular:  Negative for chest pain and palpitations.  Gastrointestinal:  Negative for abdominal pain, constipation, diarrhea, nausea and vomiting.  Genitourinary:  Negative for dysuria and urgency.  Musculoskeletal:  Negative for arthralgias, back pain and myalgias.  Neurological:  Negative for dizziness and headaches.  Psychiatric/Behavioral:  Negative for dysphoric mood. The patient is not nervous/anxious.     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) 04/26/2021   Corns and callosities    Deviated septum 07/01/2018   Deviated septum 07/01/2018   Diabetes mellitus due to underlying condition with unspecified complications (HCC) 06/08/2016   Diabetic ulcer of right heel  associated with diabetes mellitus due to underlying condition, limited to breakdown of skin (HCC) 07/31/2021   Disturbances of salivary secretion    Epistaxis    Essential hypertension    Essential tremor    H pylori ulcer    H pylori ulcer    Herpes zoster with nervous system complication 06/03/2013   Hesitancy of micturition    Hyperlipidemia    Hypothyroidism    Impacted cerumen, bilateral    Jaw pain    Lesion of femoral nerve 04/30/2013   Localized edema    Male erectile disorder    Malignant 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)     Nasal congestion 07/01/2018   Nasal turbinate hypertrophy 07/01/2018   Obstructive sleep apnea 01/17/2017   Other constipation    Other fatigue    Overweight    Pacemaker reprogramming/check 02/12/2017   Pain in right finger(s)    Pain in right lower leg    Paroxysmal atrial fibrillation (HCC) 06/08/2016   Peptic ulcer disease    Persistent atrial fibrillation (HCC) 06/01/2017   Primary insomnia    Renal stones    Renal stones    Secondary hypercoagulable state (HCC) 06/17/2020   Shingles 2014   WITH COMPLICATIONS (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)   Shingles 06/2012   Shortness of breath    Sick sinus syndrome (HCC)    Sinus bradycardia 03/07/2017   Sleep apnea    dx 08/2015, CPAP   Spontaneous ecchymoses    Status post ablation of atrial fibrillation 09/16/2020   Testicular hypofunction    Thoracic or lumbosacral neuritis or radiculitis 06/03/2013   Trigger middle finger of right hand 11/28/2019   Trigger middle finger of right hand 11/28/2019   Type 2 diabetes mellitus with circulatory disorder, 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   Past Surgical History:  Procedure Laterality Date   ABLATION  06/2017   ATRIAL FIBRILLATION ABLATION N/A 05/21/2020   Procedure: ATRIAL FIBRILLATION ABLATION;  Surgeon: Regan Lemming, MD;  Location: [facility];  Service: Cardiovascular;  Laterality: N/A;   CARDIOVERSION N/A 03/22/2018   Procedure: CARDIOVERSION;  Surgeon: Jodelle Red, MD;  Location: [facility] ENDOSCOPY;  Service: Cardiovascular;  Laterality: N/A;   CARDIOVERSION N/A 07/08/2020   Procedure: CARDIOVERSION;  Surgeon: Jake Bathe, MD;  Location: [facility] ENDOSCOPY;  Service: Cardiovascular;  Laterality: N/A;   HEMIARTHROPLASTY HIP  10/31/2013   IT HIP BIPOLAR   LUMBAR SPINE SURGERY     PACEMAKER PLACEMENT  01/29/2017   SHOULDER SURGERY Right     Family History  Problem Relation Age of Onset   Heart attack  Mother    Transient ischemic attack Mother    Heart Problems Father    Tuberculosis Brother    Diabetes type II Other    Hyperlipidemia Other    Congestive Heart Failure Other    Arthritis Other    Social History   Socioeconomic History   Marital status: Married    Spouse name: Kennon Rounds   Number of children: 3   Years of education: Not on file   Highest education level: Not on file  Occupational History   Occupation: RETIRED    Comment: school principal  Tobacco Use   Smoking status: Never   Smokeless tobacco: Never  Vaping Use   Vaping Use: Never used  Substance and Sexual Activity   Alcohol use: Not Currently   Drug use: Never   Sexual activity: Not on file  Other Topics Concern   Not on file  Social History Narrative   Lives with wife   Social Determinants of Health   Financial Resource Strain: Low Risk  (11/24/2021)   Overall Financial Resource Strain (CARDIA)    Difficulty of Paying Living Expenses: Not hard at all  Food Insecurity: No Food Insecurity (11/24/2021)   Hunger Vital Sign    Worried About Running Out of Food in the Last Year: Never true    Ran Out of Food in the Last Year: Never true  Transportation Needs: No Transportation Needs (11/24/2021)   PRAPARE - Administrator, Civil Service (Medical): No    Lack of Transportation (Non-Medical): No  Physical Activity: Sufficiently Active (11/24/2021)   Exercise Vital Sign    Days of Exercise per Week: 4 days    Minutes of Exercise per Session: 60 min  Stress: No Stress Concern Present (11/24/2021)   [employer] of Occupational Health - Occupational Stress Questionnaire    Feeling of Stress : Not at all  Social Connections: Socially Integrated (11/24/2021)   Social Connection and Isolation Panel [NHANES]    Frequency of Communication with Friends and Family: More than three times a week    Frequency of Social Gatherings with Friends and Family: More than three times a week    Attends Religious  Services: More than 4 times per year    Active Member of [employer] or Organizations: Yes    Attends Engineer, structural: More than 4 times per year    Marital Status: Married    Objective:  There were no vitals taken for this visit.     10/16/2022    3:03 PM 09/22/2022    7:26 AM 08/23/2022    3:29 PM  BP/Weight  Systolic BP 110 120 114  Diastolic BP 58 72 64  Wt. (Lbs) 261 251 240  BMI 32.62 kg/m2 31.37 kg/m2 30 kg/m2    Physical Exam  Diabetic Foot Exam - Simple   No data filed      Lab Results  Component Value Date   WBC 8.2 08/23/2022   HGB 12.2 (L) 08/23/2022   HCT 37.2 (L) 08/23/2022   PLT 228 08/23/2022   GLUCOSE 149 (H) 08/23/2022   CHOL 129 07/19/2022   TRIG 56 07/19/2022   HDL 55 07/19/2022   LDLCALC 62 07/19/2022   ALT 14 08/23/2022   AST 20 08/23/2022   NA 139 08/23/2022   K 4.9 08/23/2022   CL 104 08/23/2022   CREATININE 1.43 (H) 08/23/2022   BUN 23 08/23/2022   CO2 20 08/23/2022   TSH 7.530 (H) 08/23/2022   HGBA1C 7.3 (H) 07/19/2022   MICROALBUR 80 01/20/2021      Assessment & Plan:    There are no diagnoses linked to this encounter.   No orders of the defined types were placed in this encounter.   No orders of the defined types were placed in this encounter.    Follow-up: No follow-ups on file.   I,Kesley Gaffey M Myrle Wanek,acting as a Neurosurgeon for Blane Ohara, MD.,have documented all relevant documentation on the behalf of Blane Ohara, MD,as directed by  Blane Ohara, MD while in the presence of Blane Ohara, MD.   An After Visit Summary was printed and given to the patient.  Blane Ohara, MD [facility] [phone]

## 2022-10-27 ENCOUNTER — Other Ambulatory Visit: Payer: Medicare Other

## 2022-11-03 ENCOUNTER — Other Ambulatory Visit: Payer: Self-pay | Admitting: Family Medicine

## 2022-11-03 DIAGNOSIS — E08621 Diabetes mellitus due to underlying condition with foot ulcer: Secondary | ICD-10-CM

## 2022-11-05 ENCOUNTER — Encounter: Payer: Self-pay | Admitting: Family Medicine

## 2022-11-05 DIAGNOSIS — E1121 Type 2 diabetes mellitus with diabetic nephropathy: Secondary | ICD-10-CM | POA: Insufficient documentation

## 2022-11-05 NOTE — Assessment & Plan Note (Signed)
Control: poor. Not at goal Recommend check sugars fasting daily. Recommend check feet daily. Recommend annual eye exams. Medicines:  Ozempic 0.25 mg weekly x 4 weeks then increase 0.5 mg once weekly.  Call when gone and we can increase to 1 mg weekly.  Recommend hold glipizide if sugars start to drop.  Continue farxiga 10 mg daily, and insulin glargine 20 U before bed. . Continue to work on eating a healthy diet and exercise.  Labs to be drawn next week.

## 2022-11-05 NOTE — Assessment & Plan Note (Signed)
Stable

## 2022-11-05 NOTE — Assessment & Plan Note (Signed)
Management per specialist. 

## 2022-11-05 NOTE — Assessment & Plan Note (Signed)
Check tsh and free T4. Patient to return for fasting lab.

## 2022-11-05 NOTE — Assessment & Plan Note (Signed)
Stable. Continue carvedilol 12.5 mg twice daily.

## 2022-11-05 NOTE — Assessment & Plan Note (Signed)
Increase primidone to 50 mg twice daily.

## 2022-11-05 NOTE — Assessment & Plan Note (Signed)
Well controlled.  ?No changes to medicines. Continue Atorvastatin 10 mg daily. ?Continue to work on eating a healthy diet and exercise.  ?Labs drawn today.  ? ?

## 2022-11-06 ENCOUNTER — Other Ambulatory Visit: Payer: Self-pay

## 2022-11-06 ENCOUNTER — Telehealth: Payer: Self-pay

## 2022-11-06 DIAGNOSIS — E782 Mixed hyperlipidemia: Secondary | ICD-10-CM

## 2022-11-06 DIAGNOSIS — I130 Hypertensive heart and chronic kidney disease with heart failure and stage 1 through stage 4 chronic kidney disease, or unspecified chronic kidney disease: Secondary | ICD-10-CM

## 2022-11-06 DIAGNOSIS — E1159 Type 2 diabetes mellitus with other circulatory complications: Secondary | ICD-10-CM

## 2022-11-06 DIAGNOSIS — L97411 Non-pressure chronic ulcer of right heel and midfoot limited to breakdown of skin: Secondary | ICD-10-CM

## 2022-11-06 DIAGNOSIS — E039 Hypothyroidism, unspecified: Secondary | ICD-10-CM

## 2022-11-06 NOTE — Telephone Encounter (Addendum)
Appointment was made for fasting albs.    ----- Message from Blane Ohara, MD sent at 11/05/2022  6:20 PM EDT ----- Regarding: LABS Patient was supposed to return for fasting labs on 5/10. Appt was cancelled. Please call him and ask him to come for fasting labs. Dr. Sedalia Muta

## 2022-11-07 ENCOUNTER — Other Ambulatory Visit: Payer: Medicare Other

## 2022-11-07 DIAGNOSIS — E1159 Type 2 diabetes mellitus with other circulatory complications: Secondary | ICD-10-CM | POA: Diagnosis not present

## 2022-11-07 DIAGNOSIS — I130 Hypertensive heart and chronic kidney disease with heart failure and stage 1 through stage 4 chronic kidney disease, or unspecified chronic kidney disease: Secondary | ICD-10-CM | POA: Diagnosis not present

## 2022-11-07 DIAGNOSIS — E039 Hypothyroidism, unspecified: Secondary | ICD-10-CM

## 2022-11-07 DIAGNOSIS — E782 Mixed hyperlipidemia: Secondary | ICD-10-CM | POA: Diagnosis not present

## 2022-11-08 LAB — LIPID PANEL
Chol/HDL Ratio: 2.6 ratio (ref 0.0–5.0)
Cholesterol, Total: 115 mg/dL (ref 100–199)
HDL: 45 mg/dL (ref >39)
LDL Chol Calc (NIH): 55 mg/dL (ref 0–99)
Triglycerides: 70 mg/dL (ref 0–149)
VLDL Cholesterol Cal: 15 mg/dL (ref 5–40)

## 2022-11-08 LAB — MICROALBUMIN / CREATININE URINE RATIO
Creatinine, Urine: 94.7 mg/dL
Microalb/Creat Ratio: 52 mg/g creat — ABNORMAL HIGH (ref 0–29)
Microalbumin, Urine: 49 ug/mL

## 2022-11-08 LAB — HEMOGLOBIN A1C
Est. average glucose Bld gHb Est-mCnc: 143 mg/dL
Hgb A1c MFr Bld: 6.6 % — ABNORMAL HIGH (ref 4.8–5.6)

## 2022-11-08 LAB — COMPREHENSIVE METABOLIC PANEL
ALT: 16 IU/L (ref 0–44)
AST: 22 IU/L (ref 0–40)
Albumin/Globulin Ratio: 1.5 (ref 1.2–2.2)
Albumin: 4.1 g/dL (ref 3.7–4.7)
Alkaline Phosphatase: 123 IU/L — ABNORMAL HIGH (ref 44–121)
BUN/Creatinine Ratio: 17 (ref 10–24)
BUN: 27 mg/dL (ref 8–27)
Bilirubin Total: 0.7 mg/dL (ref 0.0–1.2)
CO2: 23 mmol/L (ref 20–29)
Calcium: 8.8 mg/dL (ref 8.6–10.2)
Chloride: 106 mmol/L (ref 96–106)
Creatinine, Ser: 1.55 mg/dL — ABNORMAL HIGH (ref 0.76–1.27)
Globulin, Total: 2.7 g/dL (ref 1.5–4.5)
Glucose: 67 mg/dL — ABNORMAL LOW (ref 70–99)
Potassium: 5.3 mmol/L — ABNORMAL HIGH (ref 3.5–5.2)
Sodium: 140 mmol/L (ref 134–144)
Total Protein: 6.8 g/dL (ref 6.0–8.5)
eGFR: 44 mL/min/{1.73_m2} — ABNORMAL LOW (ref >59)

## 2022-11-08 LAB — T4, FREE: Free T4: 1.49 ng/dL (ref 0.82–1.77)

## 2022-11-08 LAB — CARDIOVASCULAR RISK ASSESSMENT

## 2022-11-08 LAB — CBC WITH DIFFERENTIAL/PLATELET
Basophils Absolute: 0.1 10*3/uL (ref 0.0–0.2)
Basos: 1 %
EOS (ABSOLUTE): 0.1 10*3/uL (ref 0.0–0.4)
Eos: 3 %
Hematocrit: 44.3 % (ref 37.5–51.0)
Hemoglobin: 13.7 g/dL (ref 13.0–17.7)
Immature Grans (Abs): 0 10*3/uL (ref 0.0–0.1)
Immature Granulocytes: 0 %
Lymphocytes Absolute: 0.7 10*3/uL (ref 0.7–3.1)
Lymphs: 17 %
MCH: 31.1 pg (ref 26.6–33.0)
MCHC: 30.9 g/dL — ABNORMAL LOW (ref 31.5–35.7)
MCV: 101 fL — ABNORMAL HIGH (ref 79–97)
Monocytes Absolute: 0.6 10*3/uL (ref 0.1–0.9)
Monocytes: 15 %
Neutrophils Absolute: 2.7 10*3/uL (ref 1.4–7.0)
Neutrophils: 64 %
Platelets: 181 10*3/uL (ref 150–450)
RBC: 4.4 x10E6/uL (ref 4.14–5.80)
RDW: 13.4 % (ref 11.6–15.4)
WBC: 4.2 10*3/uL (ref 3.4–10.8)

## 2022-11-08 LAB — TSH: TSH: 4.7 u[IU]/mL — ABNORMAL HIGH (ref 0.450–4.500)

## 2022-11-09 ENCOUNTER — Other Ambulatory Visit: Payer: Self-pay

## 2022-11-09 MED ORDER — LEVOTHYROXINE SODIUM 137 MCG PO TABS: 137.0000 ug | ORAL_TABLET | Freq: Every day | ORAL | 0 refills | Status: DC

## 2022-11-10 DIAGNOSIS — L89616 Pressure-induced deep tissue damage of right heel: Secondary | ICD-10-CM | POA: Diagnosis not present

## 2022-11-10 DIAGNOSIS — L97411 Non-pressure chronic ulcer of right heel and midfoot limited to breakdown of skin: Secondary | ICD-10-CM | POA: Diagnosis not present

## 2022-11-14 ENCOUNTER — Other Ambulatory Visit: Payer: Medicare Other

## 2022-11-17 DIAGNOSIS — L89616 Pressure-induced deep tissue damage of right heel: Secondary | ICD-10-CM | POA: Diagnosis not present

## 2022-11-17 DIAGNOSIS — L97411 Non-pressure chronic ulcer of right heel and midfoot limited to breakdown of skin: Secondary | ICD-10-CM | POA: Diagnosis not present

## 2022-11-19 ENCOUNTER — Other Ambulatory Visit: Payer: Self-pay | Admitting: Family Medicine

## 2022-11-20 ENCOUNTER — Other Ambulatory Visit: Payer: Self-pay

## 2022-11-20 ENCOUNTER — Telehealth: Payer: Self-pay

## 2022-11-20 MED ORDER — OZEMPIC (1 MG/DOSE) 2 MG/1.5ML ~~LOC~~ SOPN: 1.0000 mg | PEN_INJECTOR | SUBCUTANEOUS | 0 refills | Status: DC

## 2022-11-20 NOTE — Telephone Encounter (Signed)
Patient called stating that he has finished his 0.25 mg and has done 2-3 weeks of the 0.5 mg of the Ozempic. I asked the patient if he was tollerating the medication ok and he states yes he is and I told the patient normally you will increase the patient to the 1 mg if they are tollerating the 0.5 mg. If you would like to increase patient is ok with this and would like for it to be sent to [facility].

## 2022-11-20 NOTE — Telephone Encounter (Signed)
Patient made aware, Rx sent to pharmacy

## 2022-11-21 DIAGNOSIS — L97411 Non-pressure chronic ulcer of right heel and midfoot limited to breakdown of skin: Secondary | ICD-10-CM | POA: Diagnosis not present

## 2022-11-21 DIAGNOSIS — L89612 Pressure ulcer of right heel, stage 2: Secondary | ICD-10-CM | POA: Diagnosis not present

## 2022-11-28 DIAGNOSIS — L97411 Non-pressure chronic ulcer of right heel and midfoot limited to breakdown of skin: Secondary | ICD-10-CM | POA: Diagnosis not present

## 2022-11-28 DIAGNOSIS — L89612 Pressure ulcer of right heel, stage 2: Secondary | ICD-10-CM | POA: Diagnosis not present

## 2022-12-01 ENCOUNTER — Other Ambulatory Visit: Payer: Self-pay | Admitting: Family Medicine

## 2022-12-01 DIAGNOSIS — L97411 Non-pressure chronic ulcer of right heel and midfoot limited to breakdown of skin: Secondary | ICD-10-CM

## 2022-12-01 DIAGNOSIS — E1159 Type 2 diabetes mellitus with other circulatory complications: Secondary | ICD-10-CM

## 2022-12-05 ENCOUNTER — Telehealth: Payer: Self-pay

## 2022-12-05 DIAGNOSIS — L89612 Pressure ulcer of right heel, stage 2: Secondary | ICD-10-CM | POA: Diagnosis not present

## 2022-12-05 NOTE — Telephone Encounter (Signed)
Called patient and informed him that he would need to go to the ER even though we had recommended it before to his wife but she still wanted [place] to send message and ask Dr. Sedalia Muta but it still stands that he would need to go to the ER. Patient stated that he felt a little better now but if he felt the need that he would go on to the ER that he would go. Lastly reminded patient again that it would be important and it is recommended by Dr. Sedalia Muta for the patient to go on to the ER. And he states he understood verbally.

## 2022-12-05 NOTE — Telephone Encounter (Signed)
Must go to ED. Dr. Sedalia Muta

## 2022-12-05 NOTE — Telephone Encounter (Signed)
Patient's wife called stating that the patient has been feeling weak for the past 3 days and that the patient went to Wound care today and they took his BP and it was 60/40 she stated. She states that all he does is get up and go back to bed. I asked her if he has had any fever,cough,chest congestion, head congestion, Shortness of breath, or dizziness and she states that he does not. She is wanting to know what patient needs to do. Please advise.

## 2022-12-07 ENCOUNTER — Ambulatory Visit (INDEPENDENT_AMBULATORY_CARE_PROVIDER_SITE_OTHER): Payer: Medicare Other

## 2022-12-07 DIAGNOSIS — I495 Sick sinus syndrome: Secondary | ICD-10-CM | POA: Diagnosis not present

## 2022-12-11 ENCOUNTER — Telehealth: Payer: Self-pay

## 2022-12-11 NOTE — Telephone Encounter (Signed)
Patient has had several days of increased AF burden. Vrates controlled.  Hx of PAF, on Xarelto.   LM to assess for patient symptoms.

## 2022-12-12 DIAGNOSIS — L89612 Pressure ulcer of right heel, stage 2: Secondary | ICD-10-CM | POA: Diagnosis not present

## 2022-12-12 DIAGNOSIS — L97411 Non-pressure chronic ulcer of right heel and midfoot limited to breakdown of skin: Secondary | ICD-10-CM | POA: Diagnosis not present

## 2022-12-12 NOTE — Telephone Encounter (Signed)
Patient called advised increased in AF. Patient reports of increased fatigue and dizziness at times. X3 days. Advised patient AF clinic referral is recommended d/t increase in AF burden and symptoms. Patient agreeable.

## 2022-12-15 ENCOUNTER — Encounter (HOSPITAL_COMMUNITY): Payer: Self-pay | Admitting: Internal Medicine

## 2022-12-15 ENCOUNTER — Ambulatory Visit (HOSPITAL_COMMUNITY)
Admission: RE | Admit: 2022-12-15 | Discharge: 2022-12-15 | Disposition: A | Discharge status: Home / Self Care | Payer: Medicare Other | Source: Ambulatory Visit | Attending: Internal Medicine | Admitting: Internal Medicine

## 2022-12-15 VITALS — BP 140/82 | HR 72 | Ht 75.0 in | Wt 271.8 lb

## 2022-12-15 DIAGNOSIS — I4819 Other persistent atrial fibrillation: Secondary | ICD-10-CM | POA: Diagnosis not present

## 2022-12-15 DIAGNOSIS — G4733 Obstructive sleep apnea (adult) (pediatric): Secondary | ICD-10-CM | POA: Diagnosis not present

## 2022-12-15 DIAGNOSIS — D6869 Other thrombophilia: Secondary | ICD-10-CM | POA: Insufficient documentation

## 2022-12-15 DIAGNOSIS — E119 Type 2 diabetes mellitus without complications: Secondary | ICD-10-CM | POA: Insufficient documentation

## 2022-12-15 DIAGNOSIS — I495 Sick sinus syndrome: Secondary | ICD-10-CM | POA: Insufficient documentation

## 2022-12-15 DIAGNOSIS — I10 Essential (primary) hypertension: Secondary | ICD-10-CM | POA: Diagnosis not present

## 2022-12-15 DIAGNOSIS — Z8249 Family history of ischemic heart disease and other diseases of the circulatory system: Secondary | ICD-10-CM | POA: Insufficient documentation

## 2022-12-15 DIAGNOSIS — Z7901 Long term (current) use of anticoagulants: Secondary | ICD-10-CM | POA: Diagnosis not present

## 2022-12-15 NOTE — Progress Notes (Signed)
Primary Care Physician: Blane Ohara, MD Primary Electrophysiologist: Dr Elberta Fortis Referring Physician: Dr Alveda Reasons Paul Kent is a 82 y.o. male with a history of HTN, HLD, OSA, DM, hypothyroid, SSS s/p PPM, and persistent atrial fibrillation who presents for follow up in the [facility]. Patient is s/p cryo ablation by Dr Terri Skains in 2019. He was changed from flecainide to amiodarone. Patient is on Xarelto for a CHADS2VASC score of 4. He underwent RF afib ablation with Dr Elberta Fortis on 05/21/20. Patient stopped his amiodarone on his own at that time. The device clinic received an alert on 06/03/20 that he was persistently in afib. Amiodarone was resumed 06/17/20.  On follow up today, patient is s/p DCCV on 07/08/20. He did develop flash pulmonary edema that night and was seen at [facility] for diuresis. He has been doing well since then with no heart racing or palpitations. He feels his is back to his dry weight.   On follow up 12/15/22, he is currently in NSR. Device alert on 6/24 for several days of increased Afib burden. He reports feeling weak, dizzy, and having no energy when in Afib. He felt he was in Afib for the past couple of days and today no longer in Afib. He feels well today. No missed doses of Xarelto. He takes lasix only as needed per guidelines when he has rapid acute weight gain.   Today, he denies symptoms of palpitations, chest pain, orthopnea, PND, lower extremity edema, dizziness, presyncope, syncope, bleeding, or neurologic sequela. The patient is tolerating medications without difficulties and is otherwise without complaint today.    Atrial Fibrillation Risk Factors:  he does have symptoms or diagnosis of sleep apnea. he is compliant with CPAP therapy. he does not have a history of rheumatic fever.   he has a BMI of Body mass index is 33.97 kg/m.Marland Kitchen Filed Weights   12/15/22 1118  Weight: 123.3 kg     Family History  Problem  Relation Age of Onset   Heart attack Mother    Transient ischemic attack Mother    Heart Problems Father    Tuberculosis Brother    Diabetes type II Other    Hyperlipidemia Other    Congestive Heart Failure Other    Arthritis Other     Atrial Fibrillation Management history:  Previous antiarrhythmic drugs: flecainide, amiodarone Previous cardioversions: 03/22/18, 07/08/20 Previous ablations: cryo 2019, 05/21/20 CHADS2VASC score: 4 Anticoagulation history: Xarelto   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) 04/26/2021   Corns and callosities    Deviated septum 07/01/2018   Deviated septum 07/01/2018   Diabetes mellitus due to underlying condition with unspecified complications (HCC) 06/08/2016   Diabetic ulcer of right heel associated with diabetes mellitus due to underlying condition, limited to breakdown of skin (HCC) 07/31/2021   Disturbances of salivary secretion    Epistaxis    Essential hypertension    Essential tremor    H pylori ulcer    H pylori ulcer  Herpes zoster with nervous system complication 06/03/2013   Hesitancy of micturition    Hyperlipidemia    Hypothyroidism    Impacted cerumen, bilateral    Jaw pain    Lesion of femoral nerve 04/30/2013   Localized edema    Male erectile disorder    Malignant 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)    Nasal congestion 07/01/2018   Nasal turbinate hypertrophy 07/01/2018   Obstructive sleep apnea 01/17/2017   Other constipation    Other fatigue    Overweight    Pacemaker reprogramming/check  02/12/2017   Pain in right finger(s)    Pain in right lower leg    Paroxysmal atrial fibrillation (HCC) 06/08/2016   Peptic ulcer disease    Persistent atrial fibrillation (HCC) 06/01/2017   Primary insomnia    Renal stones    Renal stones    Secondary hypercoagulable state (HCC) 06/17/2020   Shingles 2014   WITH COMPLICATIONS (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)   Shingles 06/2012   Shortness of breath    Sick sinus syndrome (HCC)    Sinus bradycardia 03/07/2017   Sleep apnea    dx 08/2015, CPAP   Spontaneous ecchymoses    Status post ablation of atrial fibrillation 09/16/2020   Testicular hypofunction    Thoracic or lumbosacral neuritis or radiculitis 06/03/2013   Trigger middle finger of right hand 11/28/2019   Trigger middle finger of right hand 11/28/2019   Type 2 diabetes mellitus with circulatory disorder, 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   Past Surgical History:  Procedure Laterality Date   ABLATION  06/2017   ATRIAL FIBRILLATION ABLATION N/A 05/21/2020   Procedure: ATRIAL FIBRILLATION ABLATION;  Surgeon: Regan Lemming, MD;  Location: [facility] INVASIVE CV LAB;  Service: Cardiovascular;  Laterality: N/A;   CARDIOVERSION N/A 03/22/2018   Procedure: CARDIOVERSION;  Surgeon: Jodelle Red, MD;  Location: [facility] ENDOSCOPY;  Service: Cardiovascular;  Laterality: N/A;   CARDIOVERSION N/A 07/08/2020   Procedure: CARDIOVERSION;  Surgeon: Jake Bathe, MD;  Location: [facility] ENDOSCOPY;  Service: Cardiovascular;  Laterality: N/A;   HEMIARTHROPLASTY HIP  10/31/2013   IT HIP BIPOLAR   LUMBAR SPINE SURGERY     PACEMAKER PLACEMENT  01/29/2017   SHOULDER SURGERY Right     Current Outpatient Medications  Medication Sig Dispense Refill   amiodarone (PACERONE) 200 MG tablet TAKE ONE TABLET BY MOUTH EVERY DAY 90 tablet 1   atorvastatin (LIPITOR) 10 MG tablet TAKE ONE TABLET BY MOUTH EVERY EVENING 90 tablet 1   carvedilol (COREG) 12.5  MG tablet Take 1 tablet (12.5 mg total) by mouth 2 (two) times daily. 180 tablet 3   famotidine (PEPCID) 40 MG tablet Take 1 tablet (40 mg total) by mouth daily. 90 tablet 1   furosemide (LASIX) 40 MG tablet Take 1 tablet (40 mg total) by mouth daily. (Patient taking differently: Take 40 mg by mouth as needed.) 30 tablet 1   gabapentin (NEURONTIN) 300 MG capsule Take 1 capsule (300 mg total) by mouth 2 (two) times daily. 180 capsule 1   glipiZIDE (GLUCOTROL XL) 10 MG 24 hr tablet TAKE ONE TABLET BY MOUTH TWICE DAILY 180 tablet 1   KERENDIA 10 MG TABS Take 1 tablet (10 mg total) by mouth daily. 30 tablet 2   levothyroxine (SYNTHROID) 137 MCG tablet Take 1 tablet (137 mcg total) by mouth daily before breakfast. 90 tablet 0   primidone (MYSOLINE) 50 MG tablet Take 1 tablet (50 mg total)  by mouth in the morning and at bedtime. 180 tablet 0   primidone (MYSOLINE) 50 MG tablet Take 1 tablet (50 mg total) by mouth 2 (two) times daily. (Patient taking differently: Take 50 mg by mouth daily.) 180 tablet 3   tamsulosin (FLOMAX) 0.4 MG CAPS capsule Take 1 capsule (0.4 mg total) by mouth daily after supper. 30 capsule 1   testosterone cypionate (DEPOTESTOSTERONE CYPIONATE) 200 MG/ML injection INJECT 200MG  (1 MILLILITER) into the muscle EVERY 14 DAYS 10 mL 0   TOUJEO SOLOSTAR 300 UNIT/ML Solostar Pen Inject 20 Units into the skin daily at 6 (six) AM. 4.5 mL 0   XARELTO 20 MG TABS tablet TAKE ONE TABLET BY MOUTH EVERY EVENING 90 tablet 1   B-D UF III MINI PEN NEEDLES 31G X 5 MM MISC USE AS DIRECTED EVERY DAY to inject insulin 100 each 1   Continuous Blood Gluc Sensor (DEXCOM G7 SENSOR) MISC To use as directed to monitor glucose (Patient not taking: Reported on 12/15/2022) 3 each 3   Semaglutide, 1 MG/DOSE, (OZEMPIC, 1 MG/DOSE,) 2 MG/1.5ML SOPN Inject 1 mg into the skin once a week. (Patient not taking: Reported on 12/15/2022) 6 mL 0   No current facility-administered medications for this encounter.    Allergies   Allergen Reactions   Propranolol     Drops HR too low  Other reaction(s): Other (See Comments) Drops HR too low Drops HR too low Drops HR too low   Canagliflozin     Patient had an adverse reaction Other reaction(s): Other (See Comments) Patient had an adverse reaction Patient had an adverse reaction   Clonidine Derivatives Nausea And Vomiting   Hydralazine Hcl     Patient had an adverse reaction    Metformin And Related Diarrhea   Topiramate     Patient had an adverse reaction Other reaction(s): Other (See Comments) Patient had an adverse reaction Patient had an adverse reaction   Ambien [Zolpidem] Other (See Comments)    Amnesia    Farxiga [Dapagliflozin]     Recurrent UTIs.    Ozempic (0.25 Or 0.5 Mg-Dose) [Semaglutide(0.25 Or 0.5mg -Dos)] Other (See Comments)    Nausea, abdominal pain     ROS- All systems are reviewed and negative except as per the HPI above.  Physical Exam: Vitals:   12/15/22 1118  BP: (!) 140/82  Pulse: 72  Weight: 123.3 kg  Height: 6\' 3"  (1.905 m)   GEN- The patient is well appearing, alert and oriented x 3 today.   Head- normocephalic, atraumatic Eyes-  Sclera clear, conjunctiva pink Ears- hearing intact Lungs- Clear to ausculation bilaterally, normal work of breathing Heart- Regular rate and rhythm, no murmurs, rubs or gallops, PMI not laterally displaced Extremities- no clubbing, cyanosis, or edema MS- no significant deformity or atrophy Skin- no rash or lesion Psych- euthymic mood, full affect Neuro- strength and sensation are intact   Wt Readings from Last 3 Encounters:  12/15/22 123.3 kg  10/26/22 115.2 kg  10/16/22 118.4 kg    EKG today demonstrates  Vent. rate 72 BPM PR interval 248 ms QRS duration 182 ms QT/QTcB 510/558 ms P-R-T axes * 112 -8 AV dual-paced rhythm with prolonged AV conduction Abnormal ECG When compared with ECG of 14-Jul-2020 11:38, PREVIOUS ECG IS PRESENT  Echo 04/22/22 [facility])  demonstrated  Moderately depressed ejection fraction. EF 40-45% Wall motion abnormalities noted as described above. 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 Pacemaker wire noted in the right ventricular. No significant  valvular abnormalities.  Epic records are reviewed at length today  CHA2DS2-VASc Score =   4 The patient's score is based upon:  HTN T2DM Age     ASSESSMENT AND PLAN: 1. Persistent atrial fibrillation  The patient's CHA2DS2-VASc score is  , indicating a  % annual risk of stroke.   S/p afib ablation 05/21/20 S/p DCCV on 07/08/20  He is in NSR today, having spontaneously converted to normal rhythm apparently today. Will continue amiodarone 200 mg daily without change. We can consider in the future temporary increase of amiodarone to 200 mg BID if he goes out of rhythm for an extended period.  He takes lasix prn swelling and weight gain.  2. Secondary Hypercoagulable State (ICD10:  D68.69) The patient is at significant risk for stroke/thromboembolism based upon his CHA2DS2-VASc Score of 4  .  Continue Rivaroxaban (Xarelto).  No missed doses.  3. HTN Stable, no changes today.  4. Tachybradycardia syndrome S/p PPM, followed by Dr Elberta Fortis and the device clinic.   Follow up Afib clinic prn.   Lake Bells, PA-C Afib Clinic [facility] [address] [place], [place] [address] [phone] 12/15/2022 11:55 AM

## 2022-12-18 ENCOUNTER — Ambulatory Visit (HOSPITAL_COMMUNITY): Payer: Medicare Other | Admitting: Internal Medicine

## 2022-12-18 LAB — CUP PACEART REMOTE DEVICE CHECK
Battery Voltage: 40
Date Time Interrogation Session: 20240628123900
Implantable Lead Connection Status: 753985
Implantable Lead Connection Status: 753985
Implantable Lead Implant Date: 20180813
Implantable Lead Implant Date: 20180813
Implantable Lead Location: 753859
Implantable Lead Location: 753860
Implantable Lead Model: 377
Implantable Lead Model: 377
Implantable Lead Serial Number: 49893169
Implantable Lead Serial Number: 50011411
Implantable Pulse Generator Implant Date: 20180813
Pulse Gen Model: 407145
Pulse Gen Serial Number: 69158272

## 2022-12-19 DIAGNOSIS — L89612 Pressure ulcer of right heel, stage 2: Secondary | ICD-10-CM | POA: Diagnosis not present

## 2022-12-19 DIAGNOSIS — L97411 Non-pressure chronic ulcer of right heel and midfoot limited to breakdown of skin: Secondary | ICD-10-CM | POA: Diagnosis not present

## 2022-12-26 DIAGNOSIS — L97411 Non-pressure chronic ulcer of right heel and midfoot limited to breakdown of skin: Secondary | ICD-10-CM | POA: Diagnosis not present

## 2022-12-26 DIAGNOSIS — L89612 Pressure ulcer of right heel, stage 2: Secondary | ICD-10-CM | POA: Diagnosis not present

## 2022-12-27 NOTE — Progress Notes (Signed)
Remote pacemaker transmission.   

## 2023-01-01 ENCOUNTER — Telehealth: Payer: Self-pay

## 2023-01-01 NOTE — Telephone Encounter (Signed)
[facility]: letter of medical necessity

## 2023-01-02 ENCOUNTER — Telehealth: Payer: Self-pay

## 2023-01-02 DIAGNOSIS — L97411 Non-pressure chronic ulcer of right heel and midfoot limited to breakdown of skin: Secondary | ICD-10-CM | POA: Diagnosis not present

## 2023-01-02 DIAGNOSIS — L89612 Pressure ulcer of right heel, stage 2: Secondary | ICD-10-CM | POA: Diagnosis not present

## 2023-01-02 MED ORDER — AMIODARONE HCL 200 MG PO TABS: ORAL_TABLET | ORAL | 1 refills | Status: DC

## 2023-01-02 NOTE — Telephone Encounter (Signed)
Per Landry Mellow PA will increase amiodarone to 200mg  twice a day for the next 14 days and have another transmission sent to assess afib burden. Pt wife in agreement.

## 2023-01-02 NOTE — Telephone Encounter (Signed)
Pt called in stating he was supposed to call and see if he is still in afib before he starts amiodarone. Pt will send in transmission and would like a call back to discuss

## 2023-01-02 NOTE — Telephone Encounter (Signed)
Atrial burden diagnostics as of today 01/02/23

## 2023-01-02 NOTE — Addendum Note (Signed)
Addended by: Shona Simpson on: 01/02/2023 04:12 PM   Modules accepted: Orders

## 2023-01-02 NOTE — Telephone Encounter (Signed)
LVM for patient to call device clinic back.

## 2023-01-02 NOTE — Telephone Encounter (Signed)
Patient's wife is returning call and requesting call back.

## 2023-01-04 ENCOUNTER — Other Ambulatory Visit: Payer: Self-pay | Admitting: Family Medicine

## 2023-01-04 DIAGNOSIS — E08621 Diabetes mellitus due to underlying condition with foot ulcer: Secondary | ICD-10-CM

## 2023-01-09 DIAGNOSIS — L89612 Pressure ulcer of right heel, stage 2: Secondary | ICD-10-CM | POA: Diagnosis not present

## 2023-01-11 ENCOUNTER — Ambulatory Visit: Payer: Medicare Other | Admitting: Family Medicine

## 2023-01-11 ENCOUNTER — Encounter: Payer: Self-pay | Admitting: Family Medicine

## 2023-01-11 VITALS — BP 126/80 | HR 106 | Temp 97.9°F | Resp 14 | Ht 75.0 in | Wt 276.0 lb

## 2023-01-11 DIAGNOSIS — I509 Heart failure, unspecified: Secondary | ICD-10-CM | POA: Diagnosis not present

## 2023-01-11 DIAGNOSIS — R600 Localized edema: Secondary | ICD-10-CM | POA: Diagnosis not present

## 2023-01-11 DIAGNOSIS — I482 Chronic atrial fibrillation, unspecified: Secondary | ICD-10-CM | POA: Diagnosis not present

## 2023-01-11 NOTE — Progress Notes (Signed)
Subjective:  Patient ID: Paul Kent, male    DOB: [birth year]  Age: 82 y.o. MRN: [id number]  Chief Complaint  Patient presents with   Leg Swelling    HPI Patient is here for bilateral legs swelling since 3-4 weeks and they are worsening. He mentioned shortness of breath lately. He takes furosemide PRN. He denies chest pain, dizziness, headaches. He gained weight 5 lbs. He does not weigh himself daily.     10/26/2022    2:35 PM 02/13/2022    9:10 AM 11/24/2021    9:00 AM 04/06/2021   10:56 AM 01/20/2021    8:51 AM  Depression screen PHQ 2/9  Decreased Interest 0 0 0 0 0  Down, Depressed, Hopeless 0 0 0 0 0  PHQ - 2 Score 0 0 0 0 0  Altered sleeping 0      Tired, decreased energy 0      Change in appetite 0      Feeling bad or failure about yourself  0      Trouble concentrating 0      Moving slowly or fidgety/restless 0      Suicidal thoughts 0      PHQ-9 Score 0      Difficult doing work/chores Not difficult at all            10/26/2022    2:35 PM  Fall Risk   Falls in the past year? 0  Number falls in past yr: 0  Injury with Fall? 0  Risk for fall due to : History of fall(s)  Follow up Falls evaluation completed;Falls prevention discussed    Patient Care Team: Blane Ohara, MD as PCP - General (Internal Medicine) Regan Lemming, MD as PCP - Electrophysiology (Cardiology) Debroah Baller, MD as Consulting Physician (Urology) Ulice Brilliant, MD (Orthopedic Surgery) Cherly Beach, MD as Referring Physician (Family Medicine) Evelena Leyden, OD (Optometry) Zettie Pho, [facility] (Inactive) as Pharmacist (Pharmacist)   Review of Systems  Constitutional:  Negative for chills, fatigue, fever and unexpected weight change.  HENT:  Negative for congestion, ear pain, sinus pain and sore throat.   Respiratory:  Positive for shortness of breath. Negative for cough.   Cardiovascular:  Positive for leg swelling (bilateral). Negative for chest pain and palpitations.   Gastrointestinal:  Negative for abdominal pain, blood in stool, constipation, diarrhea, nausea and vomiting.  Endocrine: Negative for polydipsia.  Genitourinary:  Negative for dysuria.  Musculoskeletal:  Negative for back pain.  Skin:  Negative for rash.  Neurological:  Negative for headaches.    Current Outpatient Medications on File Prior to Visit  Medication Sig Dispense Refill   amiodarone (PACERONE) 200 MG tablet Take 1 tablet by mouth twice a day for 14 days then reduce back to once a day 120 tablet 1   atorvastatin (LIPITOR) 10 MG tablet TAKE ONE TABLET BY MOUTH EVERY EVENING 90 tablet 1   B-D UF III MINI PEN NEEDLES 31G X 5 MM MISC USE AS DIRECTED EVERY DAY to inject insulin 100 each 1   carvedilol (COREG) 12.5 MG tablet Take 1 tablet (12.5 mg total) by mouth 2 (two) times daily. 180 tablet 3   Continuous Blood Gluc Sensor (DEXCOM G7 SENSOR) MISC To use as directed to monitor glucose (Patient not taking: Reported on 12/15/2022) 3 each 3   famotidine (PEPCID) 40 MG tablet Take 1 tablet (40 mg total) by mouth daily. 90 tablet 1   furosemide (LASIX) 40 MG tablet Take  1 tablet (40 mg total) by mouth daily. (Patient taking differently: Take 40 mg by mouth as needed.) 30 tablet 1   gabapentin (NEURONTIN) 300 MG capsule Take 1 capsule (300 mg total) by mouth 2 (two) times daily. 180 capsule 1   glipiZIDE (GLUCOTROL XL) 10 MG 24 hr tablet TAKE ONE TABLET BY MOUTH TWICE DAILY 180 tablet 1   KERENDIA 10 MG TABS Take 1 tablet (10 mg total) by mouth daily. 30 tablet 2   levothyroxine (SYNTHROID) 137 MCG tablet TAKE ONE TABLET BY MOUTH EVERY MORNING 90 tablet 0   primidone (MYSOLINE) 50 MG tablet Take 1 tablet (50 mg total) by mouth in the morning and at bedtime. 180 tablet 0   primidone (MYSOLINE) 50 MG tablet Take 1 tablet (50 mg total) by mouth 2 (two) times daily. (Patient taking differently: Take 50 mg by mouth daily.) 180 tablet 3   Semaglutide, 1 MG/DOSE, (OZEMPIC, 1 MG/DOSE,) 2 MG/1.5ML  SOPN Inject 1 mg into the skin once a week. (Patient not taking: Reported on 12/15/2022) 6 mL 0   tamsulosin (FLOMAX) 0.4 MG CAPS capsule Take 1 capsule (0.4 mg total) by mouth daily after supper. 30 capsule 1   testosterone cypionate (DEPOTESTOSTERONE CYPIONATE) 200 MG/ML injection INJECT 200MG  (1 MILLILITER) into the muscle EVERY 14 DAYS 10 mL 0   TOUJEO SOLOSTAR 300 UNIT/ML Solostar Pen Inject 20 Units into the skin daily at 6 (six) AM. 4.5 mL 0   XARELTO 20 MG TABS tablet TAKE ONE TABLET BY MOUTH EVERY EVENING 90 tablet 1   No current facility-administered medications on file prior to visit.   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) 04/26/2021   Corns and callosities    Deviated septum 07/01/2018   Deviated septum 07/01/2018   Diabetes mellitus due to underlying condition with unspecified complications (HCC) 06/08/2016   Diabetic ulcer of right heel associated with diabetes mellitus due to underlying condition, limited to breakdown of skin (HCC) 07/31/2021   Disturbances of salivary secretion    Epistaxis    Essential hypertension    Essential tremor    H pylori ulcer    H pylori ulcer    Herpes zoster with nervous system complication 06/03/2013   Hesitancy of micturition    Hyperlipidemia    Hypothyroidism    Impacted cerumen, bilateral    Jaw pain    Lesion of femoral nerve 04/30/2013   Localized edema    Male erectile disorder    Malignant 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)    Nasal congestion 07/01/2018   Nasal turbinate hypertrophy 07/01/2018    Obstructive sleep apnea 01/17/2017   Other constipation    Other fatigue    Overweight    Pacemaker reprogramming/check 02/12/2017   Pain in right finger(s)    Pain in right lower leg    Paroxysmal atrial fibrillation (HCC) 06/08/2016   Peptic ulcer disease    Persistent atrial fibrillation (HCC) 06/01/2017   Primary insomnia    Renal stones  Renal stones    Secondary hypercoagulable state (HCC) 06/17/2020   Shingles 2014   WITH COMPLICATIONS (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)   Shingles 06/2012   Shortness of breath    Sick sinus syndrome (HCC)    Sinus bradycardia 03/07/2017   Sleep apnea    dx 08/2015, CPAP   Spontaneous ecchymoses    Status post ablation of atrial fibrillation 09/16/2020   Testicular hypofunction    Thoracic or lumbosacral neuritis or radiculitis 06/03/2013   Trigger middle finger of right hand 11/28/2019   Trigger middle finger of right hand 11/28/2019   Type 2 diabetes mellitus with circulatory disorder, 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   Past Surgical History:  Procedure Laterality Date   ABLATION  06/2017   ATRIAL FIBRILLATION ABLATION N/A 05/21/2020   Procedure: ATRIAL FIBRILLATION ABLATION;  Surgeon: Regan Lemming, MD;  Location: [facility] INVASIVE CV LAB;  Service: Cardiovascular;  Laterality: N/A;   CARDIOVERSION N/A 03/22/2018   Procedure: CARDIOVERSION;  Surgeon: Jodelle Red, MD;  Location: [facility] ENDOSCOPY;  Service: Cardiovascular;  Laterality: N/A;   CARDIOVERSION N/A 07/08/2020   Procedure: CARDIOVERSION;  Surgeon: Jake Bathe, MD;  Location: [facility] ENDOSCOPY;  Service: Cardiovascular;  Laterality: N/A;   HEMIARTHROPLASTY HIP  10/31/2013   IT HIP BIPOLAR   LUMBAR SPINE SURGERY     PACEMAKER PLACEMENT  01/29/2017   SHOULDER SURGERY Right     Family History  Problem Relation Age of Onset   Heart attack Mother    Transient ischemic attack Mother    Heart Problems Father     Tuberculosis Brother    Diabetes type II Other    Hyperlipidemia Other    Congestive Heart Failure Other    Arthritis Other    Social History   Socioeconomic History   Marital status: Married    Spouse name: Kennon Rounds   Number of children: 3   Years of education: Not on file   Highest education level: Not on file  Occupational History   Occupation: RETIRED    Comment: school principal  Tobacco Use   Smoking status: Never   Smokeless tobacco: Never  Vaping Use   Vaping status: Never Used  Substance and Sexual Activity   Alcohol use: Not Currently   Drug use: Never   Sexual activity: Not on file  Other Topics Concern   Not on file  Social History Narrative   Lives with wife   Social Determinants of Health   Financial Resource Strain: Low Risk  (11/24/2021)   Overall Financial Resource Strain (CARDIA)    Difficulty of Paying Living Expenses: Not hard at all  Food Insecurity: No Food Insecurity (11/24/2021)   Hunger Vital Sign    Worried About Running Out of Food in the Last Year: Never true    Ran Out of Food in the Last Year: Never true  Transportation Needs: No Transportation Needs (11/24/2021)   PRAPARE - Administrator, Civil Service (Medical): No    Lack of Transportation (Non-Medical): No  Physical Activity: Sufficiently Active (11/24/2021)   Exercise Vital Sign    Days of Exercise per Week: 4 days    Minutes of Exercise per Session: 60 min  Stress: No Stress Concern Present (11/24/2021)   [employer] of Occupational Health - Occupational Stress Questionnaire    Feeling of Stress : Not at all  Social Connections: Socially Integrated (11/24/2021)   Social Connection and Isolation Panel [NHANES]  Frequency of Communication with Friends and Family: More than three times a week    Frequency of Social Gatherings with Friends and Family: More than three times a week    Attends Religious Services: More than 4 times per year    Active Member of [employer] or  Organizations: Yes    Attends Engineer, structural: More than 4 times per year    Marital Status: Married    Objective:  BP 126/80   Pulse (!) 106   Temp 97.9 F (36.6 C)   Resp 14   Ht 6\' 3"  (1.905 m)   Wt 276 lb (125.2 kg)   SpO2 96%   BMI 34.50 kg/m      01/11/2023   10:23 AM 12/15/2022   11:18 AM 10/26/2022    2:25 PM  BP/Weight  Systolic BP 126 140 110  Diastolic BP 80 82 60  Wt. (Lbs) 276 271.8 254  BMI 34.5 kg/m2 33.97 kg/m2 31.75 kg/m2    Physical Exam Vitals reviewed.  Constitutional:      Appearance: Normal appearance. He is obese.  Neck:     Vascular: No carotid bruit.  Cardiovascular:     Rate and Rhythm: Normal rate and regular rhythm.     Heart sounds: Normal heart sounds.  Pulmonary:     Effort: Pulmonary effort is normal.     Breath sounds: Normal breath sounds. No wheezing, rhonchi or rales.  Abdominal:     General: Bowel sounds are normal.     Palpations: Abdomen is soft.     Tenderness: There is no abdominal tenderness.  Musculoskeletal:     Right lower leg: Edema present.     Left lower leg: Edema present.  Skin:    Comments: Weeping.  Neurological:     Mental Status: He is alert and oriented to person, place, and time.  Psychiatric:        Mood and Affect: Mood normal.        Behavior: Behavior normal.     Diabetic Foot Exam - Simple   No data filed      Lab Results  Component Value Date   WBC 4.8 01/11/2023   HGB 12.5 (L) 01/11/2023   HCT 39.1 01/11/2023   PLT 141 (L) 01/11/2023   GLUCOSE 79 01/11/2023   CHOL 115 11/07/2022   TRIG 70 11/07/2022   HDL 45 11/07/2022   LDLCALC 55 11/07/2022   ALT 16 01/11/2023   AST 18 01/11/2023   NA 142 01/11/2023   K 5.2 01/11/2023   CL 106 01/11/2023   CREATININE 1.65 (H) 01/11/2023   BUN 26 01/11/2023   CO2 23 01/11/2023   TSH 4.700 (H) 11/07/2022   HGBA1C 6.6 (H) 11/07/2022   MICROALBUR 80 01/20/2021      Assessment & Plan:    Pedal edema Assessment & Plan: Take  lasix 40 mg every day. Weigh daily   Orders: -     CBC with Differential/Platelet  Chronic atrial fibrillation [facility]) Assessment & Plan: Order EKG. LBBB.  Orders: -     EKG 12-Lead  Acute on chronic heart failure, unspecified heart failure type [facility]) Assessment & Plan: Take lasix 40 mg every day. Weigh daily Call if you gain 2-3 lb (1-1.4 kg) in 24 hours or 5 lb (2.3 kg) in a week Check labs  Orders: -     Comprehensive metabolic panel -     Pro b natriuretic peptide (BNP)  Other orders -  [facility] CKD Program     No orders of the defined types were placed in this encounter.   Orders Placed This Encounter  Procedures   Comprehensive metabolic panel   CBC with Differential/Platelet   Pro b natriuretic peptide (BNP)   [facility] CKD Program   EKG 12-Lead     Follow-up: No follow-ups on file.   I,Marla I Leal-Borjas,acting as a scribe for Blane Ohara, MD.,have documented all relevant documentation on the behalf of Blane Ohara, MD,as directed by  Blane Ohara, MD while in the presence of Blane Ohara, MD.   An After Visit Summary was printed and given to the patient.  Blane Ohara, MD [facility] [phone]

## 2023-01-11 NOTE — Patient Instructions (Addendum)
Take lasix 40 mg every day.  Weigh daily Call if you gain 2-3 lb (1-1.4 kg) in 24 hours or 5 lb (2.3 kg) in a week

## 2023-01-12 LAB — COMPREHENSIVE METABOLIC PANEL: CO2: 23 mmol/L (ref 20–29)

## 2023-01-12 LAB — CBC WITH DIFFERENTIAL/PLATELET
RDW: 13.2 % (ref 11.6–15.4)
WBC: 4.8 10*3/uL (ref 3.4–10.8)

## 2023-01-13 DIAGNOSIS — I509 Heart failure, unspecified: Secondary | ICD-10-CM | POA: Insufficient documentation

## 2023-01-13 NOTE — Assessment & Plan Note (Signed)
Take lasix 40 mg every day. Weigh daily

## 2023-01-13 NOTE — Assessment & Plan Note (Signed)
Take lasix 40 mg every day. Weigh daily Call if you gain 2-3 lb (1-1.4 kg) in 24 hours or 5 lb (2.3 kg) in a week Check labs

## 2023-01-13 NOTE — Assessment & Plan Note (Signed)
Order EKG.

## 2023-01-15 ENCOUNTER — Other Ambulatory Visit: Payer: Self-pay

## 2023-01-15 MED ORDER — FUROSEMIDE 40 MG PO TABS: 40.0000 mg | ORAL_TABLET | Freq: Two times a day (BID) | ORAL | 0 refills | Status: DC

## 2023-01-16 ENCOUNTER — Telehealth: Payer: Self-pay

## 2023-01-16 DIAGNOSIS — L97411 Non-pressure chronic ulcer of right heel and midfoot limited to breakdown of skin: Secondary | ICD-10-CM | POA: Diagnosis not present

## 2023-01-16 DIAGNOSIS — L89612 Pressure ulcer of right heel, stage 2: Secondary | ICD-10-CM | POA: Diagnosis not present

## 2023-01-16 NOTE — Telephone Encounter (Signed)
Unsuccessful attempt to reach patient on preferred number listed in notes for scheduled AWV. Left message on voicemail okay to reschedule. 

## 2023-01-17 ENCOUNTER — Telehealth: Payer: Self-pay

## 2023-01-17 NOTE — Telephone Encounter (Signed)
AF burden as of today 01/17/23 as requested by AF clinic

## 2023-01-17 NOTE — Telephone Encounter (Signed)
Patient called back and would like a call back .

## 2023-01-17 NOTE — Telephone Encounter (Signed)
Device Clinic has not contacted this patient, patient needs to contact AF clinic

## 2023-01-18 ENCOUNTER — Telehealth: Payer: Self-pay

## 2023-01-18 NOTE — Telephone Encounter (Signed)
I gave the patient the number to the A-fib clinic.

## 2023-01-19 ENCOUNTER — Other Ambulatory Visit: Payer: Self-pay | Admitting: Family Medicine

## 2023-01-23 ENCOUNTER — Encounter: Payer: Self-pay | Admitting: Cardiology

## 2023-01-23 ENCOUNTER — Ambulatory Visit: Payer: Medicare Other | Attending: Cardiology | Admitting: Cardiology

## 2023-01-23 VITALS — BP 110/60 | HR 65 | Ht 75.0 in | Wt 260.8 lb

## 2023-01-23 DIAGNOSIS — I5022 Chronic systolic (congestive) heart failure: Secondary | ICD-10-CM

## 2023-01-23 DIAGNOSIS — R0602 Shortness of breath: Secondary | ICD-10-CM | POA: Diagnosis present

## 2023-01-23 DIAGNOSIS — L89613 Pressure ulcer of right heel, stage 3: Secondary | ICD-10-CM | POA: Diagnosis not present

## 2023-01-23 DIAGNOSIS — D6859 Other primary thrombophilia: Secondary | ICD-10-CM | POA: Diagnosis present

## 2023-01-23 DIAGNOSIS — I482 Chronic atrial fibrillation, unspecified: Secondary | ICD-10-CM | POA: Insufficient documentation

## 2023-01-23 DIAGNOSIS — I4819 Other persistent atrial fibrillation: Secondary | ICD-10-CM

## 2023-01-23 DIAGNOSIS — L97411 Non-pressure chronic ulcer of right heel and midfoot limited to breakdown of skin: Secondary | ICD-10-CM | POA: Diagnosis not present

## 2023-01-23 DIAGNOSIS — I5020 Unspecified systolic (congestive) heart failure: Secondary | ICD-10-CM

## 2023-01-23 NOTE — Progress Notes (Addendum)
Cardiology Office Note:  .   Date:  01/23/2023  ID:  Paul Kent, DOB Nov 17, 1940, MRN [id number] PCP: Paul Ohara, MD  [facility] Providers Cardiologist:  None Electrophysiologist:  Paul Jorja Loa, MD    History of Present Illness: .   Paul Kent is a 82 y.o. male with a past medical history of chronic atrial fibrillation s/p cryoablation 2019 > 2021, sick sinus syndrome s/p PPM, chronic heart failure, OSA on CPAP, GERD, DM 2, CKD stage IIIb, history of bladder cancer.  12/18/2022 device interrogation normal review 04/22/2022 echocardiogram EF 40 to 45%, severe concentric LVH 05/16/2022 Lexiscan no evidence of ischemia, intermediate risk secondary to decreased EF which could be related to paced rhythm 10/06/2021 ABIs normal  Evaluated by Dr. Elberta Kent on 10/16/2022, he continued to have episodes of atrial fibrillation and remained on amiodarone.  He presents today accompanied by his wife with concerns of pedal edema.  He was evaluated by his PCP on 01/11/2023, proBNP was checked and it was elevated at greater than 6000--his Lasix was increased to 40 mg twice daily.  His weight during that visit was 276, today in our office his weight is 260.  He states that he initially noticed a good response with the Lasix however does not feel it is working as well anymore.  His wife states that last night his upper thighs were very edematous however that seems to have subsided today.  We reviewed his dietary intake the day before he did have a pot roast and his wife made a comment that she does salted meat.  This may be contributory to his increased edema.  Overall he feels well, he was able to go to the gym and workout without any incident. He denies chest pain, palpitations, dyspnea, pnd, orthopnea, n, v, dizziness, syncope, weight gain, or early satiety.    ROS: Review of Systems  Constitutional: Negative.   HENT: Negative.    Eyes: Negative.   Respiratory: Negative.     Cardiovascular:  Positive for leg swelling.  Gastrointestinal: Negative.   Genitourinary: Negative.   Musculoskeletal: Negative.   Skin:  Positive for rash (LLL).  Neurological: Negative.   Endo/Heme/Allergies: Negative.   Psychiatric/Behavioral: Negative.       Studies Reviewed: Paul Kent Kitchen   EKG Interpretation Date/Time:  Tuesday January 23 2023 15:12:01 EDT Ventricular Rate:  65 PR Interval:  258 QRS Duration:  184 QT Interval:  546 QTC Calculation: 567 R Axis:   108  Text Interpretation: AV dual-paced rhythm with prolonged AV conduction Abnormal ECG When compared with ECG of 15-Dec-2022 11:33, Vent. rate has decreased BY   7 BPM Confirmed by Paul Kent [phone]) on 01/23/2023 4:23:02 PM    Cardiac Studies & Procedures     STRESS TESTS  MYOCARDIAL PERFUSION IMAGING 05/16/2022  Narrative   Findings are consistent with no  ischemia and no prior myocardial infarction. The study is intermediate risk secondary to diminished EF which could be related to paced rhythm.   No ST deviation was noted.   Left ventricular function is abnormal. Global function is mildly reduced. Nuclear stress EF: 44 %. The left ventricular ejection fraction is moderately decreased (30-44%). End diastolic cavity size is normal.   Prior study not available for comparison.   ECHOCARDIOGRAM  ECHOCARDIOGRAM COMPLETE 03/01/2018  Narrative *[facility]* [address] [place], [place] [address] [phone]  ------------------------------------------------------------------- Transthoracic Echocardiography  Patient:    Paul Kent, Paul Kent MR #:       [id number]  Study Date: 03/01/2018 Gender:     M Age:        25 Height:     190.5 cm Weight:     125.2 kg BSA:        2.61 m^2 Pt. Status: Room:  SONOGRAPHER  Paul Kent, RDCS ATTENDING    Paul Kent, M.D. Paul Kent, Paul [id number] REFERRING    Kent, Paul [phone] PERFORMING   [facility],  Outpatient  cc:  ------------------------------------------------------------------- LV EF: 45% -   50%  ------------------------------------------------------------------- Indications:      (I48.91).  (I50.9).  ------------------------------------------------------------------- History:   PMH:  DEFINITY. Acquired from the patient and from the patient&'s chart.  Dyspnea and bilateral lower extremity edema. Atrial fibrillation.  ------------------------------------------------------------------- Study Conclusions  - Left ventricle: The cavity size was normal. Wall thickness was increased in a pattern of mild LVH. Systolic function was mildly reduced. The estimated ejection fraction was in the range of 45% to 50%. Diffuse hypokinesis. - Left atrium: The atrium was mildly dilated. Volume/bsa, S: 37.6 ml/m^2. - Right ventricle: The cavity size was moderately dilated. Wall thickness was normal. Pacer wire or catheter noted in right ventricle.  ------------------------------------------------------------------- Labs, prior tests, procedures, and surgery: Permanent pacemaker system implantation.  ------------------------------------------------------------------- Study data:  No prior study was available for comparison.  Study status:  Routine.  Procedure:  The patient reported no pain pre or post test. Transthoracic echocardiography for diagnosis, for left ventricular function evaluation, for right ventricular function evaluation, and for assessment of valvular function. Image quality was adequate. The study was technically difficult, as a result of poor sound wave transmission. Intravenous contrast (Definity) was administered to opacify the LV.  Study completion:  There were no complications.          Transthoracic echocardiography.  M-mode, complete 2D, spectral Doppler, and color Doppler.  Birthdate: Patient birthdate: [birth year].  Age:  Patient is 82 yr old.  Sex: Gender:  male.    BMI: 34.5 kg/m^2.  Blood pressure:     120/75 Patient status:  Outpatient.  Study date:  Study date: 03/01/2018. Study time: 10:36 AM.  Location:  [facility]  -------------------------------------------------------------------  ------------------------------------------------------------------- Left ventricle:  The cavity size was normal. Wall thickness was increased in a pattern of mild LVH. Systolic function was mildly reduced. The estimated ejection fraction was in the range of 45% to 50%. Diffuse hypokinesis.  ------------------------------------------------------------------- Aortic valve:   Trileaflet; normal thickness leaflets. Mobility was not restricted.  Doppler:  Transvalvular velocity was within the normal range. There was no stenosis. There was no regurgitation.  ------------------------------------------------------------------- Aorta:  Aortic root: The aortic root was normal in size.  ------------------------------------------------------------------- Mitral valve:   Structurally normal valve.   Mobility was not restricted.  Doppler:  Transvalvular velocity was within the normal range. There was no evidence for stenosis. There was no regurgitation.  ------------------------------------------------------------------- Left atrium:  The atrium was mildly dilated.  ------------------------------------------------------------------- Right ventricle:  The cavity size was moderately dilated. Wall thickness was normal. Pacer wire or catheter noted in right ventricle. Systolic function was normal.  ------------------------------------------------------------------- Pulmonic valve:   Poorly visualized.  Structurally normal valve. Cusp separation was normal.  Doppler:  Transvalvular velocity was within the normal range. There was no evidence for stenosis. There was no  regurgitation.  ------------------------------------------------------------------- Tricuspid valve:   Structurally normal valve.    Doppler: Transvalvular velocity was within the normal range. There was no regurgitation.  ------------------------------------------------------------------- Pulmonary artery:   The main pulmonary artery  was normal-sized. Systolic pressure was within the normal range.  ------------------------------------------------------------------- Right atrium:  The atrium was normal in size.  ------------------------------------------------------------------- Pericardium:  There was no pericardial effusion.  ------------------------------------------------------------------- Systemic veins: Inferior vena cava: The vessel was normal in size.  ------------------------------------------------------------------- Measurements  Left ventricle                          Value        Reference LV ID, ED, PLAX chordal         (H)     55.7  mm     43 - 52 LV ID, ES, PLAX chordal                 37    mm     23 - 38 LV fx shortening, PLAX chordal          34    %      >=29 LV PW thickness, ED                     12.9  mm     --------- IVS/LV PW ratio, ED                     0.78         <=1.3 Stroke volume, 2D                       76    ml     --------- Stroke volume/bsa, 2D                   29    ml/m^2 --------- LV e&', medial                           5.55  cm/s   ---------  Ventricular septum                      Value        Reference IVS thickness, ED                       10    mm     ---------  LVOT                                    Value        Reference LVOT ID, S                              24    mm     --------- LVOT area                               4.52  cm^2   --------- LVOT ID                                 24    mm     --------- LVOT peak velocity, S                   73.2  cm/s   --------- LVOT mean velocity,  S                   51.4  cm/s    --------- LVOT VTI, S                             16.9  cm     --------- Stroke volume (SV), LVOT DP             76.5  ml     --------- Stroke index (SV/bsa), LVOT DP          29.3  ml/m^2 ---------  Aorta                                   Value        Reference Aortic root ID, ED                      36    mm     --------- Ascending aorta ID, A-P, S              40    mm     ---------  Left atrium                             Value        Reference LA ID, A-P, ES                          48    mm     --------- LA ID/bsa, A-P                          1.84  cm/m^2 <=2.2 LA volume, S                            98.2  ml     --------- LA volume/bsa, S                        37.6  ml/m^2 --------- LA volume, ES, 1-p A4C                  94.6  ml     --------- LA volume/bsa, ES, 1-p A4C              36.2  ml/m^2 --------- LA volume, ES, 1-p A2C                  97.1  ml     --------- LA volume/bsa, ES, 1-p A2C              37.2  ml/m^2 ---------  Right atrium                            Value        Reference RA ID, S-I, ES                          63.2  mm     --------- RA ID, M-L, ES, A4C  36.4  mm     30 - 46  Right ventricle                         Value        Reference RV ID, minor axis, ED, A4C base         45.1  mm     --------- TAPSE                                   24.8  mm     --------- RV s&', lateral, S                       8.92  cm/s   ---------  Legend: (L)  and  (H)  mark values outside specified reference range.  ------------------------------------------------------------------- Prepared and Electronically Authenticated by  Paul Kent, M.D. 2019-09-13T13:57:06             Risk Assessment/Calculations:    CHA2DS2-VASc Score = 4   This indicates a 4.8% annual risk of stroke. The patient's score is based upon: CHF History: 1 HTN History: 0 Diabetes History: 1 Stroke History: 0 Vascular Disease History: 0 Age Score: 2 Gender Score: 0             Physical Exam:   VS:  BP 110/60 (BP Location: Right Arm, Patient Position: Sitting, Cuff Size: Normal)   Pulse 65   Ht 6\' 3"  (1.905 m)   Wt 260 lb 12.8 oz (118.3 kg)   SpO2 99%   BMI 32.60 kg/m    Wt Readings from Last 3 Encounters:  01/23/23 260 lb 12.8 oz (118.3 kg)  01/11/23 276 lb (125.2 kg)  12/15/22 271 lb 12.8 oz (123.3 kg)    GEN: Well nourished, well developed in no acute distress NECK: No JVD; No carotid bruits CARDIAC: RRR, no murmurs, rubs, gallops RESPIRATORY:  Clear to auscultation without rales, wheezing or rhonchi  ABDOMEN: Soft, non-tender, non-distended EXTREMITIES: +1 edema mid calf,  No deformity   ASSESSMENT AND PLAN: .   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  He has hypotension at times so we cannot start ARNI/ARB/ACE.  He was started on Farxiga however developed recurrent UTIs.  Continue Coreg 12.5 mg twice daily, continue Lasix 40 mg twice daily.  Paul repeat CMET and proBNP today.  If his proBNP is still elevated since increasing his Lasix to 40 mg twice daily we Paul change him to torsemide 20 mg twice daily.  Would not be a good candidate for MRA, most recent potassium was 5.2, creatinine 1.64. Atrial fibrillation/hypercoagulable state -s/p ablation x 2 >/P DCCV times 07/08/2020, today he is in sinus rhythm.  Continue Xarelto 20 mg daily--CrCl 69, continue Coreg 12.5 mg twice daily. Followed up with atrial fibrillation clinic and was noted to be back in sinus rhythm, however recommendations that he could increase amiodarone to BID if persisted. He states he is keenly aware when he goes out of rhythm and has not had recurrence.  Sick sinus syndrome/presence of PPM-managed by EP, continues to have episodes of atrial fibrillation and recommendations to continue amiodarone.  Followed up with atrial fibrillation clinic and was noted to be back in sinus rhythm.       Dispo: BMET, proBNP, return in 6 months.    Signed, Victorino Dike  Ramond Craver, NP

## 2023-01-23 NOTE — Patient Instructions (Signed)
Medication Instructions:  Your physician recommends that you continue on your current medications as directed. Please refer to the Current Medication list given to you today.  *If you need a refill on your cardiac medications before your next appointment, please call your pharmacy*   Lab Work: Your physician recommends that you return for lab work in:   Labs today: BMP, Pro BNP  If you have labs (blood work) drawn today and your tests are completely normal, you will receive your results only by: MyChart Message (if you have MyChart) OR A paper copy in the mail If you have any lab test that is abnormal or we need to change your treatment, we will call you to review the results.   Testing/Procedures: None   Follow-Up: At [facility], you and your health needs are our priority.  As part of our continuing mission to provide you with exceptional heart care, we have created designated Provider Care Teams.  These Care Teams include your primary Cardiologist (physician) and Advanced Practice Providers (APPs -  Physician Assistants and Nurse Practitioners) who all work together to provide you with the care you need, when you need it.  We recommend signing up for the patient portal called "MyChart".  Sign up information is provided on this After Visit Summary.  MyChart is used to connect with patients for Virtual Visits (Telemedicine).  Patients are able to view lab/test results, encounter notes, upcoming appointments, etc.  Non-urgent messages can be sent to your provider as well.   To learn more about what you can do with MyChart, go to [email or web address].    Your next appointment:   6 month(s)  Provider:   Gypsy Balsam MD   Other Instructions None

## 2023-01-24 ENCOUNTER — Telehealth: Payer: Self-pay

## 2023-01-24 DIAGNOSIS — N289 Disorder of kidney and ureter, unspecified: Secondary | ICD-10-CM

## 2023-01-24 LAB — BASIC METABOLIC PANEL WITH GFR
BUN/Creatinine Ratio: 15 (ref 10–24)
BUN: 35 mg/dL — ABNORMAL HIGH (ref 8–27)
CO2: 25 mmol/L (ref 20–29)
Calcium: 8.6 mg/dL (ref 8.6–10.2)
Chloride: 101 mmol/L (ref 96–106)
Creatinine, Ser: 2.35 mg/dL — ABNORMAL HIGH (ref 0.76–1.27)
Glucose: 247 mg/dL — ABNORMAL HIGH (ref 70–99)
Potassium: 4.8 mmol/L (ref 3.5–5.2)
Sodium: 140 mmol/L (ref 134–144)
eGFR: 27 mL/min/1.73 — ABNORMAL LOW

## 2023-01-24 LAB — PRO B NATRIURETIC PEPTIDE: NT-Pro BNP: 3185 pg/mL — ABNORMAL HIGH (ref 0–486)

## 2023-01-24 MED ORDER — TORSEMIDE 20 MG PO TABS: ORAL_TABLET | ORAL | 0 refills | Status: DC

## 2023-01-24 NOTE — Telephone Encounter (Signed)
-----   Message from Flossie Dibble sent at 01/24/2023  7:25 AM EDT ----- You are holding onto less fluid since starting the lasix twice daily, however your kidney function has worsened--which can happen with increasing the fluid pill.  Lets stop lasix.   Start torsemide 20 mg daily.   Weigh daily, if you gain more than 3 lbs/day, you may take an additional torsemide on that day.  Watch  your sodium intake very closely, no more than 2,000 mg/day.   Return in 2 weeks for repeat BMET.

## 2023-01-24 NOTE — Telephone Encounter (Signed)
Spoke with patient's wife per his request verally, notified of results and recommendations and agreed with plan, medication sent, lab order on file.

## 2023-01-31 ENCOUNTER — Other Ambulatory Visit: Payer: Self-pay | Admitting: Family Medicine

## 2023-02-02 DIAGNOSIS — L89613 Pressure ulcer of right heel, stage 3: Secondary | ICD-10-CM | POA: Diagnosis not present

## 2023-02-02 DIAGNOSIS — L97411 Non-pressure chronic ulcer of right heel and midfoot limited to breakdown of skin: Secondary | ICD-10-CM | POA: Diagnosis not present

## 2023-02-05 ENCOUNTER — Other Ambulatory Visit: Payer: Self-pay | Admitting: Family Medicine

## 2023-02-05 DIAGNOSIS — E08621 Diabetes mellitus due to underlying condition with foot ulcer: Secondary | ICD-10-CM

## 2023-02-05 DIAGNOSIS — N289 Disorder of kidney and ureter, unspecified: Secondary | ICD-10-CM | POA: Diagnosis not present

## 2023-02-05 DIAGNOSIS — K219 Gastro-esophageal reflux disease without esophagitis: Secondary | ICD-10-CM

## 2023-02-05 DIAGNOSIS — E1159 Type 2 diabetes mellitus with other circulatory complications: Secondary | ICD-10-CM

## 2023-02-06 ENCOUNTER — Encounter: Payer: Self-pay | Admitting: Physician Assistant

## 2023-02-06 ENCOUNTER — Telehealth: Payer: Self-pay | Admitting: *Deleted

## 2023-02-06 ENCOUNTER — Ambulatory Visit (INDEPENDENT_AMBULATORY_CARE_PROVIDER_SITE_OTHER): Payer: Medicare Other | Admitting: Physician Assistant

## 2023-02-06 ENCOUNTER — Telehealth: Payer: Self-pay

## 2023-02-06 VITALS — BP 94/50 | HR 88 | Temp 97.3°F | Resp 18 | Ht 75.0 in | Wt 261.0 lb

## 2023-02-06 DIAGNOSIS — Z9181 History of falling: Secondary | ICD-10-CM | POA: Diagnosis not present

## 2023-02-06 DIAGNOSIS — E875 Hyperkalemia: Secondary | ICD-10-CM

## 2023-02-06 DIAGNOSIS — N289 Disorder of kidney and ureter, unspecified: Secondary | ICD-10-CM

## 2023-02-06 DIAGNOSIS — I130 Hypertensive heart and chronic kidney disease with heart failure and stage 1 through stage 4 chronic kidney disease, or unspecified chronic kidney disease: Secondary | ICD-10-CM

## 2023-02-06 LAB — POCT URINALYSIS DIP (CLINITEK)
Bilirubin, UA: NEGATIVE
Blood, UA: NEGATIVE
Glucose, UA: NEGATIVE mg/dL
Ketones, POC UA: NEGATIVE mg/dL
Leukocytes, UA: NEGATIVE
Nitrite, UA: NEGATIVE
POC PROTEIN,UA: NEGATIVE
Spec Grav, UA: 1.02 (ref 1.010–1.025)
Urobilinogen, UA: 0.2 E.U./dL
pH, UA: 6 (ref 5.0–8.0)

## 2023-02-06 LAB — CBC WITH DIFFERENTIAL/PLATELET
Basophils Absolute: 0.1 10*3/uL (ref 0.0–0.2)
Basos: 1 %
EOS (ABSOLUTE): 0.3 10*3/uL (ref 0.0–0.4)
Eos: 4 %
Hematocrit: 35.4 % — ABNORMAL LOW (ref 37.5–51.0)
Hemoglobin: 11.7 g/dL — ABNORMAL LOW (ref 13.0–17.7)
Immature Grans (Abs): 0 10*3/uL (ref 0.0–0.1)
Immature Granulocytes: 0 %
Lymphocytes Absolute: 0.6 10*3/uL — ABNORMAL LOW (ref 0.7–3.1)
Lymphs: 10 %
MCH: 32.3 pg (ref 26.6–33.0)
MCHC: 33.1 g/dL (ref 31.5–35.7)
MCV: 98 fL — ABNORMAL HIGH (ref 79–97)
Monocytes Absolute: 0.8 10*3/uL (ref 0.1–0.9)
Monocytes: 14 %
Neutrophils Absolute: 4 10*3/uL (ref 1.4–7.0)
Neutrophils: 71 %
Platelets: 127 10*3/uL — ABNORMAL LOW (ref 150–450)
RBC: 3.62 x10E6/uL — ABNORMAL LOW (ref 4.14–5.80)
RDW: 13.2 % (ref 11.6–15.4)
WBC: 5.7 10*3/uL (ref 3.4–10.8)

## 2023-02-06 LAB — BASIC METABOLIC PANEL WITH GFR
BUN/Creatinine Ratio: 20 (ref 10–24)
BUN: 41 mg/dL — ABNORMAL HIGH (ref 8–27)
CO2: 26 mmol/L (ref 20–29)
Calcium: 9 mg/dL (ref 8.6–10.2)
Chloride: 99 mmol/L (ref 96–106)
Creatinine, Ser: 2.09 mg/dL — ABNORMAL HIGH (ref 0.76–1.27)
Glucose: 151 mg/dL — ABNORMAL HIGH (ref 70–99)
Potassium: 5.6 mmol/L — ABNORMAL HIGH (ref 3.5–5.2)
Sodium: 136 mmol/L (ref 134–144)
eGFR: 31 mL/min/1.73 — ABNORMAL LOW

## 2023-02-06 MED ORDER — TRAMADOL HCL 50 MG PO TABS
50.0000 mg | ORAL_TABLET | Freq: Three times a day (TID) | ORAL | 0 refills | Status: AC | PRN
Start: 2023-02-06 — End: 2023-02-11

## 2023-02-06 MED ORDER — CARVEDILOL 6.25 MG PO TABS
6.2500 mg | ORAL_TABLET | Freq: Two times a day (BID) | ORAL | 3 refills | Status: DC
Start: 2023-02-06 — End: 2023-05-22

## 2023-02-06 NOTE — Telephone Encounter (Signed)
Patient notified of results and recommendations and agreed with plan, lab order on file.   

## 2023-02-06 NOTE — Progress Notes (Signed)
Acute Office Visit  Subjective:    Patient ID: Paul Kent, male    DOB: [birth year], 82 y.o.   MRN: [id number]  Chief Complaint  Patient presents with   Fall    HPI: Patient is in today for back pain.  He fell 3 days while reaching for a chair his foot slipped and he came down on the left side of his back.  He has been taking tramadol 50 mg that was on hand at home. 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) 04/26/2021   Corns and callosities    Deviated septum 07/01/2018   Deviated septum 07/01/2018   Diabetes mellitus due to underlying condition with unspecified complications (HCC) 06/08/2016   Diabetic ulcer of right heel associated with diabetes mellitus due to underlying condition, limited to breakdown of skin (HCC) 07/31/2021   Disturbances of salivary secretion    Epistaxis    Essential hypertension    Essential tremor    H pylori ulcer    H pylori ulcer    Herpes zoster with nervous system complication 06/03/2013   Hesitancy of micturition    Hyperlipidemia    Hypothyroidism    Impacted cerumen, bilateral    Jaw pain    Lesion of femoral nerve 04/30/2013   Localized edema    Male erectile disorder    Malignant 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)    Nasal congestion 07/01/2018   Nasal turbinate hypertrophy 07/01/2018   Obstructive sleep apnea 01/17/2017   Other constipation    Other fatigue    Overweight    Pacemaker reprogramming/check 02/12/2017   Pain in right finger(s)    Pain in right lower leg     Paroxysmal atrial fibrillation (HCC) 06/08/2016   Peptic ulcer disease    Persistent atrial fibrillation (HCC) 06/01/2017   Primary insomnia    Renal stones    Renal stones    Secondary hypercoagulable state (HCC) 06/17/2020   Shingles 2014   WITH COMPLICATIONS (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)   Shingles 06/2012   Shortness of breath    Sick sinus syndrome (HCC)    Sinus bradycardia 03/07/2017   Sleep apnea    dx 08/2015, CPAP   Spontaneous ecchymoses    Status post ablation of atrial fibrillation 09/16/2020   Testicular hypofunction    Thoracic or lumbosacral neuritis or radiculitis 06/03/2013   Trigger middle finger of right hand 11/28/2019   Trigger middle finger of right hand 11/28/2019   Type 2 diabetes mellitus with circulatory disorder, 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    Past Surgical History:  Procedure Laterality Date   ABLATION  06/2017   ATRIAL FIBRILLATION ABLATION N/A 05/21/2020   Procedure: ATRIAL FIBRILLATION ABLATION;  Surgeon: Regan Lemming, MD;  Location: [facility] INVASIVE [facility];  Service: Cardiovascular;  Laterality: N/A;   CARDIOVERSION N/A 03/22/2018   Procedure: CARDIOVERSION;  Surgeon: Jodelle Red, MD;  Location: [facility] ENDOSCOPY;  Service: Cardiovascular;  Laterality: N/A;   CARDIOVERSION N/A 07/08/2020   Procedure: CARDIOVERSION;  Surgeon: Jake Bathe, MD;  Location: [facility] ENDOSCOPY;  Service: Cardiovascular;  Laterality: N/A;   HEMIARTHROPLASTY HIP  10/31/2013   IT HIP BIPOLAR   LUMBAR SPINE SURGERY     PACEMAKER PLACEMENT  01/29/2017   SHOULDER SURGERY Right     Family History  Problem Relation Age of Onset   Heart attack Mother    Transient ischemic attack Mother    Heart Problems Father    Tuberculosis Brother    Diabetes type II Other    Hyperlipidemia Other    Congestive Heart Failure Other    Arthritis Other     Social History   Socioeconomic History   Marital status:  Married    Spouse name: Kennon Rounds   Number of children: 3   Years of education: Not on file   Highest education level: Not on file  Occupational History   Occupation: RETIRED    Comment: school principal  Tobacco Use   Smoking status: Never   Smokeless tobacco: Never  Vaping Use   Vaping status: Never Used  Substance and Sexual Activity   Alcohol use: Not Currently   Drug use: Never   Sexual activity: Not on file  Other Topics Concern   Not on file  Social History Narrative   Lives with wife   Social Determinants of Health   Financial Resource Strain: Low Risk  (11/24/2021)   Overall Financial Resource Strain (CARDIA)    Difficulty of Paying Living Expenses: Not hard at all  Food Insecurity: No Food Insecurity (11/24/2021)   Hunger Vital Sign    Worried About Running Out of Food in the Last Year: Never true    Ran Out of Food in the Last Year: Never true  Transportation Needs: No Transportation Needs (11/24/2021)   PRAPARE - Administrator, Civil Service (Medical): No    Lack of Transportation (Non-Medical): No  Physical Activity: Sufficiently Active (11/24/2021)   Exercise Vital Sign    Days of Exercise per Week: 4 days    Minutes of Exercise per Session: 60 min  Stress: No Stress Concern Present (11/24/2021)   [employer] of Occupational Health - Occupational Stress Questionnaire    Feeling of Stress : Not at all  Social Connections: Socially Integrated (11/24/2021)   Social Connection and Isolation Panel [NHANES]    Frequency of Communication with Friends and Family: More than three times a week    Frequency of Social Gatherings with Friends and Family: More than three times a week    Attends Religious Services: More than 4 times per year    Active Member of [employer] or Organizations: Yes    Attends Engineer, structural: More than 4 times per year    Marital Status: Married  Catering manager Violence: Not At Risk (11/24/2021)   Humiliation, Afraid, Rape, and  Kick questionnaire    Fear of Current or Ex-Partner: No    Emotionally Abused: No    Physically Abused: No    Sexually Abused: No    Outpatient Medications Prior to Visit  Medication Sig Dispense Refill   testosterone cypionate (DEPOTESTOSTERONE CYPIONATE) 200 MG/ML injection INJECT 200MG  (1 MILLILITER) into the muscle EVERY 14 DAYS 10 mL 0   XARELTO 20 MG TABS tablet TAKE ONE TABLET BY MOUTH EVERY EVENING 90 tablet 1   tamsulosin (FLOMAX) 0.4 MG CAPS capsule Take 1 capsule (0.4 mg total) by mouth  daily after supper. 30 capsule 1   torsemide (DEMADEX) 20 MG tablet Take 1 tablet daily, please monitor your weight daily. If you gain 3lbs in a day, take an extra tablet on that day. Monitor sodium intake as discussed. 135 tablet 0   amiodarone (PACERONE) 200 MG tablet Take 1 tablet by mouth twice a day for 14 days then reduce back to once a day 120 tablet 1   atorvastatin (LIPITOR) 10 MG tablet TAKE ONE TABLET BY MOUTH EVERY EVENING 90 tablet 0   B-D UF III MINI PEN NEEDLES 31G X 5 MM MISC USE AS DIRECTED EVERY DAY to inject insulin 100 each 1   Continuous Blood Gluc Sensor (DEXCOM G7 SENSOR) MISC To use as directed to monitor glucose 3 each 3   famotidine (PEPCID) 40 MG tablet TAKE ONE TABLET BY MOUTH DAILY 90 tablet 0   gabapentin (NEURONTIN) 300 MG capsule TAKE ONE CAPSULE BY MOUTH EVERY DAY and TAKE ONE CAPSULE BY MOUTH AT BEDTIME 180 capsule 1   glipiZIDE (GLUCOTROL XL) 10 MG 24 hr tablet TAKE ONE TABLET BY MOUTH TWICE DAILY 180 tablet 1   levothyroxine (SYNTHROID) 137 MCG tablet TAKE ONE TABLET BY MOUTH EVERY MORNING 90 tablet 0   primidone (MYSOLINE) 50 MG tablet Take 1 tablet (50 mg total) by mouth 2 (two) times daily. (Patient taking differently: Take 50 mg by mouth daily.) 180 tablet 3   Semaglutide, 1 MG/DOSE, (OZEMPIC, 1 MG/DOSE,) 2 MG/1.5ML SOPN Inject 1 mg into the skin once a week. 6 mL 0   TOUJEO SOLOSTAR 300 UNIT/ML Solostar Pen Inject 20 Units into the skin daily at 6 (six) AM.  4.5 mL 0   carvedilol (COREG) 12.5 MG tablet Take 1 tablet (12.5 mg total) by mouth 2 (two) times daily. 180 tablet 3   KERENDIA 10 MG TABS Take 1 tablet (10 mg total) by mouth daily. 30 tablet 2   primidone (MYSOLINE) 50 MG tablet Take 1 tablet (50 mg total) by mouth in the morning and at bedtime. 180 tablet 0   No facility-administered medications prior to visit.    Allergies  Allergen Reactions   Propranolol     Drops HR too low  Other reaction(s): Other (See Comments) Drops HR too low Drops HR too low Drops HR too low   Canagliflozin     Patient had an adverse reaction Other reaction(s): Other (See Comments) Patient had an adverse reaction Patient had an adverse reaction   Clonidine Derivatives Nausea And Vomiting   Hydralazine Hcl     Patient had an adverse reaction    Metformin And Related Diarrhea   Topiramate     Patient had an adverse reaction Other reaction(s): Other (See Comments) Patient had an adverse reaction Patient had an adverse reaction   Ambien [Zolpidem] Other (See Comments)    Amnesia    Farxiga [Dapagliflozin]     Recurrent UTIs.    Ozempic (0.25 Or 0.5 Mg-Dose) [Semaglutide(0.25 Or 0.5mg -Dos)] Other (See Comments)    Nausea, abdominal pain     Review of Systems  Constitutional:  Negative for chills and fever.  HENT:  Negative for congestion, rhinorrhea and sore throat.   Respiratory:  Positive for shortness of breath. Negative for cough.   Cardiovascular:  Negative for chest pain and palpitations.  Gastrointestinal:  Negative for abdominal pain, constipation, diarrhea, nausea and vomiting.  Genitourinary:  Negative for dysuria and urgency.  Musculoskeletal:  Positive for back pain. Negative for arthralgias and myalgias.  Neurological:  Negative for dizziness and headaches.  Psychiatric/Behavioral:  Positive for self-injury. Negative for dysphoric mood. The patient is not nervous/anxious.        Objective:        02/06/2023   11:19 AM  02/06/2023   10:48 AM 01/23/2023    3:07 PM  Vitals with BMI  Height  6\' 3"  6\' 3"   Weight  261 lbs 260 lbs 13 oz  BMI  32.62 32.6  Systolic 94 88 110  Diastolic 50 48 60  Pulse  88 65    No data found.   Physical Exam Vitals reviewed.  Constitutional:      Appearance: Normal appearance.  Cardiovascular:     Rate and Rhythm: Normal rate and regular rhythm.     Heart sounds: Normal heart sounds.  Pulmonary:     Effort: Pulmonary effort is normal.     Breath sounds: Normal breath sounds.  Abdominal:     General: Bowel sounds are normal.     Palpations: Abdomen is soft.     Tenderness: There is no abdominal tenderness.  Musculoskeletal:     Cervical back: Normal.     Thoracic back: Normal.     Lumbar back: Tenderness present. No swelling, edema, deformity, signs of trauma or bony tenderness.       Back:  Neurological:     Mental Status: He is alert and oriented to person, place, and time.  Psychiatric:        Mood and Affect: Mood normal.        Behavior: Behavior normal.     Health Maintenance Due  Topic Date Due   DTaP/Tdap/Td (1 - Tdap) Never done   Medicare Annual Wellness (AWV)  11/25/2022   INFLUENZA VACCINE  01/18/2023    There are no preventive care reminders to display for this patient.   Lab Results  Component Value Date   TSH 4.700 (H) 11/07/2022   Lab Results  Component Value Date   WBC 4.8 01/11/2023   HGB 12.5 (L) 01/11/2023   HCT 39.1 01/11/2023   MCV 99 (H) 01/11/2023   PLT 141 (L) 01/11/2023   Lab Results  Component Value Date   NA 136 02/05/2023   K 5.6 (H) 02/05/2023   CO2 26 02/05/2023   GLUCOSE 151 (H) 02/05/2023   BUN 41 (H) 02/05/2023   CREATININE 2.09 (H) 02/05/2023   BILITOT 0.8 01/11/2023   ALKPHOS 118 01/11/2023   AST 18 01/11/2023   ALT 16 01/11/2023   PROT 6.6 01/11/2023   ALBUMIN 4.0 01/11/2023   CALCIUM 9.0 02/05/2023   EGFR 31 (L) 02/05/2023   Lab Results  Component Value Date   CHOL 115 11/07/2022   Lab  Results  Component Value Date   HDL 45 11/07/2022   Lab Results  Component Value Date   LDLCALC 55 11/07/2022   Lab Results  Component Value Date   TRIG 70 11/07/2022   Lab Results  Component Value Date   CHOLHDL 2.6 11/07/2022   Lab Results  Component Value Date   HGBA1C 6.6 (H) 11/07/2022       Assessment & Plan:  History of recent fall Assessment & Plan: No obvious deformity or current edema Urine came back negative for blood so no obvious damage to kidney Take Tramadol 50mg  as directed to help with pain Will continue to monitor symptoms  Orders: -     traMADol HCl; Take 1 tablet (50 mg total) by mouth every 8 (eight) hours as  needed for up to 5 days.  Dispense: 15 tablet; Refill: 0 -     POCT URINALYSIS DIP (CLINITEK) -     CBC with Differential/Platelet  Hypertensive heart and renal disease, stage 1-4 or unspecified chronic kidney disease, with heart failure (HCC) Assessment & Plan: Reached out to cardiology to discuss low BP They suggested to change his torsemide to PRN for wt gain of 3 lbs/day.  Decrease his carvedilol to 6.24 mg bid.   Stop his Chauncey Mann  Will follow up with cardiology tomorrow  Orders: -     Carvedilol; Take 1 tablet (6.25 mg total) by mouth 2 (two) times daily with a meal.  Dispense: 60 tablet; Refill: 3     Meds ordered this encounter  Medications   traMADol (ULTRAM) 50 MG tablet    Sig: Take 1 tablet (50 mg total) by mouth every 8 (eight) hours as needed for up to 5 days.    Dispense:  15 tablet    Refill:  0   carvedilol (COREG) 6.25 MG tablet    Sig: Take 1 tablet (6.25 mg total) by mouth 2 (two) times daily with a meal.    Dispense:  60 tablet    Refill:  3    Orders Placed This Encounter  Procedures   CBC with Differential/Platelet   POCT URINALYSIS DIP (CLINITEK)     Follow-up: Return if symptoms worsen or fail to improve.  An After Visit Summary was printed and given to the patient.  Langley Gauss, [facility] [phone]

## 2023-02-06 NOTE — Assessment & Plan Note (Signed)
Reached out to cardiology to discuss low BP They suggested to change his torsemide to PRN for wt gain of 3 lbs/day.  Decrease his carvedilol to 6.24 mg bid.   Stop his Chauncey Mann  Will follow up with cardiology tomorrow

## 2023-02-06 NOTE — Assessment & Plan Note (Signed)
No obvious deformity or current edema Urine came back negative for blood so no obvious damage to kidney Take Tramadol 50mg  as directed to help with pain Will continue to monitor symptoms

## 2023-02-06 NOTE — Telephone Encounter (Signed)
Spoke with pt about getting him in with Wallis Bamberg to follow up on low BP. PA Langley Gauss reached out to [place] while seeing pt today. Pt had fallen 3 days ago and was hypothensive in office: 88/48. Victorino Dike advised to change Torsemide to as needed for weight gain of 3lbs in 1 day, Decrease Carvedilol to 6.25 2 times daily and stop [place]. Pt will follow up with Victorino Dike on 8/29 @8 :50 to follow up on these changes and the low blood pressure.

## 2023-02-06 NOTE — Telephone Encounter (Signed)
-----   Message from Flossie Dibble sent at 02/06/2023  7:35 AM EDT ----- Kidney function improved slightly. Potassium is high.  Lets stop your Chauncey Mann.  Repeat BMET in 1 week and proBNP.  If you are taking any OTC potassium supplements, please stop.

## 2023-02-07 ENCOUNTER — Ambulatory Visit: Payer: Medicare Other | Attending: Cardiology | Admitting: Cardiology

## 2023-02-07 ENCOUNTER — Encounter: Payer: Self-pay | Admitting: Cardiology

## 2023-02-07 VITALS — BP 112/50 | HR 71 | Ht 75.0 in | Wt 263.6 lb

## 2023-02-07 DIAGNOSIS — I480 Paroxysmal atrial fibrillation: Secondary | ICD-10-CM | POA: Diagnosis present

## 2023-02-07 DIAGNOSIS — I5042 Chronic combined systolic (congestive) and diastolic (congestive) heart failure: Secondary | ICD-10-CM

## 2023-02-07 DIAGNOSIS — G4733 Obstructive sleep apnea (adult) (pediatric): Secondary | ICD-10-CM | POA: Diagnosis present

## 2023-02-07 DIAGNOSIS — Z950 Presence of cardiac pacemaker: Secondary | ICD-10-CM | POA: Diagnosis present

## 2023-02-07 DIAGNOSIS — E782 Mixed hyperlipidemia: Secondary | ICD-10-CM | POA: Diagnosis present

## 2023-02-07 MED ORDER — RIVAROXABAN 15 MG PO TABS: 15.0000 mg | ORAL_TABLET | Freq: Every day | ORAL | 2 refills | Status: DC

## 2023-02-07 NOTE — Progress Notes (Signed)
Cardiology Office Note:    Date:  02/07/2023   ID:  Paul, Kent [birth year], MRN [id number]  PCP:  Blane Ohara, MD  Cardiologist:  Gypsy Balsam, MD    Referring MD: Blane Ohara, MD   Chief Complaint  Patient presents with   Hypotension    History of Present Illness:    Paul Kent is a 82 y.o. male with past medical history significant for paroxysmal atrial fibrillation, status post ablation 2019 and then 2021, now on amiodarone, cardiomyopathy with ejection fraction 40 to 45%, obstructive sleep apnea on CPAP, GERD, type 2 diabetes, chronic kidney failure, history of bladder cancer.  Recently there was some problem with swelling of lower extremities, initially he was given furosemide now torsemide.  He take torsemide on a as needed basis.  Comes today for follow-up.  Overall he said he is doing fine.  He is saying that she does have low blood pressure he said the lowest he seen was 110/60.  Denies having any dizziness but he does have some unsteadiness he said he trips on a lot of things while home for example recently fell down because he tripped on a bucket.  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) 04/26/2021   Corns and callosities    Deviated septum 07/01/2018   Deviated septum 07/01/2018   Diabetes mellitus due to underlying condition with unspecified complications (HCC) 06/08/2016   Diabetic ulcer of right heel associated with diabetes mellitus due to underlying  condition, limited to breakdown of skin (HCC) 07/31/2021   Disturbances of salivary secretion    Epistaxis    Essential hypertension    Essential tremor    H pylori ulcer    H pylori ulcer    Herpes zoster with nervous system complication 06/03/2013   Hesitancy of micturition    Hyperlipidemia    Hypothyroidism    Impacted cerumen, bilateral    Jaw pain    Lesion of femoral nerve 04/30/2013   Localized edema    Male erectile disorder    Malignant 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)    Nasal congestion 07/01/2018   Nasal turbinate hypertrophy 07/01/2018   Obstructive sleep apnea 01/17/2017   Other constipation    Other fatigue    Overweight    Pacemaker reprogramming/check 02/12/2017   Pain in right finger(s)    Pain in right lower leg    Paroxysmal atrial fibrillation (HCC) 06/08/2016   Peptic ulcer disease    Persistent atrial fibrillation (HCC) 06/01/2017   Primary insomnia    Renal stones    Renal stones    Secondary hypercoagulable state (HCC) 06/17/2020   Shingles 2014   WITH COMPLICATIONS (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)   Shingles 06/2012   Shortness of breath    Sick sinus syndrome (HCC)    Sinus bradycardia 03/07/2017   Sleep apnea    dx 08/2015, CPAP   Spontaneous  ecchymoses    Status post ablation of atrial fibrillation 09/16/2020   Testicular hypofunction    Thoracic or lumbosacral neuritis or radiculitis 06/03/2013   Trigger middle finger of right hand 11/28/2019   Trigger middle finger of right hand 11/28/2019   Type 2 diabetes mellitus with circulatory disorder, 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    Past Surgical History:  Procedure Laterality Date   ABLATION  06/2017   ATRIAL FIBRILLATION ABLATION N/A 05/21/2020   Procedure: ATRIAL FIBRILLATION ABLATION;  Surgeon: Regan Lemming, MD;  Location: [facility] INVASIVE CV LAB;  Service: Cardiovascular;  Laterality: N/A;   CARDIOVERSION N/A  03/22/2018   Procedure: CARDIOVERSION;  Surgeon: Jodelle Red, MD;  Location: [facility] ENDOSCOPY;  Service: Cardiovascular;  Laterality: N/A;   CARDIOVERSION N/A 07/08/2020   Procedure: CARDIOVERSION;  Surgeon: Jake Bathe, MD;  Location: [facility] ENDOSCOPY;  Service: Cardiovascular;  Laterality: N/A;   HEMIARTHROPLASTY HIP  10/31/2013   IT HIP BIPOLAR   LUMBAR SPINE SURGERY     PACEMAKER PLACEMENT  01/29/2017   SHOULDER SURGERY Right     Current Medications: Current Meds  Medication Sig   amiodarone (PACERONE) 200 MG tablet Take 1 tablet by mouth twice a day for 14 days then reduce back to once a day (Patient taking differently: Take 200 mg by mouth 2 (two) times daily. Take 1 tablet by mouth twice a day for 14 days then reduce back to once a day)   atorvastatin (LIPITOR) 10 MG tablet TAKE ONE TABLET BY MOUTH EVERY EVENING   B-D UF III MINI PEN NEEDLES 31G X 5 MM MISC USE AS DIRECTED EVERY DAY to inject insulin (Patient taking differently: 1 each by Other route daily.)   carvedilol (COREG) 6.25 MG tablet Take 1 tablet (6.25 mg total) by mouth 2 (two) times daily with a meal.   Continuous Blood Gluc Sensor (DEXCOM G7 SENSOR) MISC To use as directed to monitor glucose (Patient taking differently: 1 each by Other route daily. To use as directed to monitor glucose)   famotidine (PEPCID) 40 MG tablet TAKE ONE TABLET BY MOUTH DAILY   gabapentin (NEURONTIN) 300 MG capsule TAKE ONE CAPSULE BY MOUTH EVERY DAY and TAKE ONE CAPSULE BY MOUTH AT BEDTIME (Patient taking differently: Take 300 mg by mouth at bedtime.)   glipiZIDE (GLUCOTROL XL) 10 MG 24 hr tablet TAKE ONE TABLET BY MOUTH TWICE DAILY   levothyroxine (SYNTHROID) 137 MCG tablet TAKE ONE TABLET BY MOUTH EVERY MORNING   primidone (MYSOLINE) 50 MG tablet Take 1 tablet (50 mg total) by mouth 2 (two) times daily. (Patient taking differently: Take 50 mg by mouth daily.)   Semaglutide, 1 MG/DOSE, (OZEMPIC, 1 MG/DOSE,) 2 MG/1.5ML SOPN Inject 1 mg into  the skin once a week.   testosterone cypionate (DEPOTESTOSTERONE CYPIONATE) 200 MG/ML injection INJECT 200MG  (1 MILLILITER) into the muscle EVERY 14 DAYS (Patient taking differently: Inject 200 mg into the muscle every 14 (fourteen) days.)   torsemide (DEMADEX) 20 MG tablet Take 20 mg by mouth daily as needed (For weight gain of 3 pds in 1 day).   TOUJEO SOLOSTAR 300 UNIT/ML Solostar Pen Inject 20 Units into the skin daily at 6 (six) AM. (Patient taking differently: Inject 20 Units into the skin daily.)   traMADol (ULTRAM) 50 MG tablet Take 1 tablet (50 mg total) by mouth every 8 (eight) hours as needed for up to 5 days. (Patient taking differently: Take 50 mg by mouth every 8 (eight) hours as needed  for moderate pain or severe pain.)   XARELTO 20 MG TABS tablet TAKE ONE TABLET BY MOUTH EVERY EVENING (Patient taking differently: Take 20 mg by mouth daily with supper.)     Allergies:   Propranolol, Canagliflozin, Clonidine derivatives, Hydralazine hcl, Metformin and related, Topiramate, Ambien [zolpidem], Farxiga [dapagliflozin], and Ozempic (0.25 or 0.5 mg-dose) [semaglutide(0.25 or 0.5mg -dos)]   Social History   Socioeconomic History   Marital status: Married    Spouse name: Kennon Rounds   Number of children: 3   Years of education: Not on file   Highest education level: Not on file  Occupational History   Occupation: RETIRED    Comment: school principal  Tobacco Use   Smoking status: Never   Smokeless tobacco: Never  Vaping Use   Vaping status: Never Used  Substance and Sexual Activity   Alcohol use: Not Currently   Drug use: Never   Sexual activity: Not on file  Other Topics Concern   Not on file  Social History Narrative   Lives with wife   Social Determinants of Health   Financial Resource Strain: Low Risk  (11/24/2021)   Overall Financial Resource Strain (CARDIA)    Difficulty of Paying Living Expenses: Not hard at all  Food Insecurity: No Food Insecurity (11/24/2021)   Hunger  Vital Sign    Worried About Running Out of Food in the Last Year: Never true    Ran Out of Food in the Last Year: Never true  Transportation Needs: No Transportation Needs (11/24/2021)   PRAPARE - Administrator, Civil Service (Medical): No    Lack of Transportation (Non-Medical): No  Physical Activity: Sufficiently Active (11/24/2021)   Exercise Vital Sign    Days of Exercise per Week: 4 days    Minutes of Exercise per Session: 60 min  Stress: No Stress Concern Present (11/24/2021)   [employer] of Occupational Health - Occupational Stress Questionnaire    Feeling of Stress : Not at all  Social Connections: Socially Integrated (11/24/2021)   Social Connection and Isolation Panel [NHANES]    Frequency of Communication with Friends and Family: More than three times a week    Frequency of Social Gatherings with Friends and Family: More than three times a week    Attends Religious Services: More than 4 times per year    Active Member of [employer] or Organizations: Yes    Attends Engineer, structural: More than 4 times per year    Marital Status: Married     Family History: The patient's family history includes Arthritis in an other family member; Congestive Heart Failure in an other family member; Diabetes type II in an other family member; Heart Problems in his father; Heart attack in his mother; Hyperlipidemia in an other family member; Transient ischemic attack in his mother; Tuberculosis in his brother. ROS:   Please see the history of present illness.    All 14 point review of systems negative except as described per history of present illness  EKGs/Labs/Other Studies Reviewed:         Recent Labs: 11/07/2022: TSH 4.700 01/11/2023: ALT 16 01/23/2023: NT-Pro BNP 3,185 02/05/2023: BUN 41; Creatinine, Ser 2.09; Potassium 5.6; Sodium 136 02/06/2023: Hemoglobin 11.7; Platelets 127  Recent Lipid Panel    Component Value Date/Time   CHOL 115 11/07/2022 0810   TRIG 70  11/07/2022 0810   HDL 45 11/07/2022 0810   CHOLHDL 2.6 11/07/2022 0810   LDLCALC 55 11/07/2022 0810    Physical  Exam:    VS:  BP (!) 112/50 (BP Location: Left Arm, Patient Position: Sitting)   Pulse 71   Ht 6\' 3"  (1.905 m)   Wt 263 lb 9.6 oz (119.6 kg)   SpO2 95%   BMI 32.95 kg/m     Wt Readings from Last 3 Encounters:  02/07/23 263 lb 9.6 oz (119.6 kg)  02/06/23 261 lb (118.4 kg)  01/23/23 260 lb 12.8 oz (118.3 kg)     GEN:  Well nourished, well developed in no acute distress HEENT: Normal NECK: No JVD; No carotid bruits LYMPHATICS: No lymphadenopathy CARDIAC: RRR, no murmurs, no rubs, no gallops RESPIRATORY:  Clear to auscultation without rales, wheezing or rhonchi  ABDOMEN: Soft, non-tender, non-distended MUSCULOSKELETAL:  No edema; No deformity  SKIN: Warm and dry LOWER EXTREMITIES: Very mild swelling NEUROLOGIC:  Alert and oriented x 3 PSYCHIATRIC:  Normal affect   ASSESSMENT:    1. Chronic combined systolic and diastolic congestive heart failure (HCC)   2. Paroxysmal atrial fibrillation (HCC)   3. OSA on CPAP   4. Cardiac pacemaker in situ   5. Mixed hyperlipidemia    PLAN:    In order of problems listed above:  Chronic combined systolic diastolic congestive heart with mildly diminished left ventricle ejection fraction.  He takes torsemide on a as needed basis I gave him 1 more time instruction if he sees a weight going up by 2 pounds in 2 consecutive days that is the time to take some torsemide.  He said he does not like to take this medication takes this very rarely.  We had difficulty putting him on guideline directed medical therapy because of low blood pressure as well as kidney dysfunction.  He is on Coreg 6.25 daily twice daily there is, Paroxysmal atrial fibrillation small burden of A-fib based on interrogation of the device he is pacemaker acute.  He is on Xarelto which I will continue, however will wait for results of his creatinine as anticipate need  to reduce dose from 20 mg to 15 mg daily. Pacemaker in situ this is Biotronik device normal function. Obstructive sleep apnea on CPAP. Dyslipidemia I did review K PN which show me his LDL of 55 HDL 45 continue present management   Medication Adjustments/Labs and Tests Ordered: Current medicines are reviewed at length with the patient today.  Concerns regarding medicines are outlined above.  No orders of the defined types were placed in this encounter.  Medication changes: No orders of the defined types were placed in this encounter.   Signed, Georgeanna Lea, MD, [facility] 02/07/2023 11:30 AM    [facility]

## 2023-02-07 NOTE — Addendum Note (Signed)
Addended by: Heywood Bene on: 02/07/2023 11:46 AM   Modules accepted: Orders

## 2023-02-07 NOTE — Patient Instructions (Signed)
Medication Instructions:  Your physician has recommended you make the following change in your medication: Reducing Xarelto to 15mg  daily with supper  *If you need a refill on your cardiac medications before your next appointment, please call your pharmacy*   Lab Work: None If you have labs (blood work) drawn today and your tests are completely normal, you will receive your results only by: MyChart Message (if you have MyChart) OR A paper copy in the mail If you have any lab test that is abnormal or we need to change your treatment, we will call you to review the results.   Testing/Procedures: None   Follow-Up: At [facility], you and your health needs are our priority.  As part of our continuing mission to provide you with exceptional heart care, we have created designated Provider Care Teams.  These Care Teams include your primary Cardiologist (physician) and Advanced Practice Providers (APPs -  Physician Assistants and Nurse Practitioners) who all work together to provide you with the care you need, when you need it.  We recommend signing up for the patient portal called "MyChart".  Sign up information is provided on this After Visit Summary.  MyChart is used to connect with patients for Virtual Visits (Telemedicine).  Patients are able to view lab/test results, encounter notes, upcoming appointments, etc.  Non-urgent messages can be sent to your provider as well.   To learn more about what you can do with MyChart, go to [email or web address].    Your next appointment:   3 month(s)  Provider:   Gypsy Balsam, MD    Other Instructions

## 2023-02-08 ENCOUNTER — Other Ambulatory Visit: Payer: Self-pay | Admitting: Family Medicine

## 2023-02-08 DIAGNOSIS — M5137 Other intervertebral disc degeneration, lumbosacral region: Secondary | ICD-10-CM | POA: Diagnosis not present

## 2023-02-08 DIAGNOSIS — Z9181 History of falling: Secondary | ICD-10-CM | POA: Diagnosis not present

## 2023-02-08 DIAGNOSIS — M549 Dorsalgia, unspecified: Secondary | ICD-10-CM | POA: Diagnosis not present

## 2023-02-08 DIAGNOSIS — M545 Low back pain, unspecified: Secondary | ICD-10-CM | POA: Diagnosis not present

## 2023-02-08 DIAGNOSIS — R3589 Other polyuria: Secondary | ICD-10-CM

## 2023-02-12 ENCOUNTER — Telehealth: Payer: Self-pay

## 2023-02-12 ENCOUNTER — Encounter: Payer: Self-pay | Admitting: Physician Assistant

## 2023-02-12 DIAGNOSIS — L89613 Pressure ulcer of right heel, stage 3: Secondary | ICD-10-CM | POA: Diagnosis not present

## 2023-02-12 DIAGNOSIS — L97411 Non-pressure chronic ulcer of right heel and midfoot limited to breakdown of skin: Secondary | ICD-10-CM | POA: Diagnosis not present

## 2023-02-12 NOTE — Telephone Encounter (Signed)
Patient wife called and wanted to know why patient has to  be seen before he get refill on his medications.   I made her aware that he is due for chronic follow up, and he has an appointment on 02/22/23. Wife stated that she thinks he has enough medication till his appointment

## 2023-02-12 NOTE — Telephone Encounter (Signed)
The lumbar spine x-ray is in the chart under media. I have routed this to [facility], [place].

## 2023-02-12 NOTE — Telephone Encounter (Signed)
The patient called this morning stating that he had a missed call last week that he needs an appointment for medication. The patient was unsure about the medication and was unsure if he needed the appointment. Also, he asked about his x-ray results. The patient was notified that the [facility] has been behind on resulting images.  I have looked up the lumbar spine x-ray on the [facility] page, unfortunately it has not been reported yet. Only the images are available. I have contacted [facility] and spoke with Gabe to have them do the report on the lumbar spine x-ray. Waiting on the report.  Please advise on if the patient needs an appointment or not.

## 2023-02-14 NOTE — Telephone Encounter (Signed)
Called patient left detail message to call office back.

## 2023-02-15 ENCOUNTER — Ambulatory Visit: Payer: Medicare Other | Admitting: Cardiology

## 2023-02-20 DIAGNOSIS — L97411 Non-pressure chronic ulcer of right heel and midfoot limited to breakdown of skin: Secondary | ICD-10-CM | POA: Diagnosis not present

## 2023-02-20 DIAGNOSIS — L89613 Pressure ulcer of right heel, stage 3: Secondary | ICD-10-CM | POA: Diagnosis not present

## 2023-02-21 NOTE — Assessment & Plan Note (Addendum)
Well controlled.  Continue carvedilol to 6.25 mg twice daily and torsemide 20 mg daily as needed .

## 2023-02-21 NOTE — Assessment & Plan Note (Signed)
Conitnue wih CPAP with oxygen 2 L at night.

## 2023-02-21 NOTE — Assessment & Plan Note (Signed)
The current medical regimen is effective;  continue present plan and medications. 

## 2023-02-21 NOTE — Assessment & Plan Note (Addendum)
Control: poor. Not at goal Recommend check sugars fasting daily. Recommend check feet daily. Recommend annual eye exams. Medicines: Start mounjaro 2.5 mg weekly. Glipizide 10 mg twice a day, farxiga 10 mg daily, and insulin glargine 20 U before bed. Intolerant to ozempic. Continue to work on eating a healthy diet and exercise.  Labs drawn today

## 2023-02-21 NOTE — Progress Notes (Deleted)
Diabetes:  Complications: neuropathy Glucose checking:once daily in am.  Glucose logs: 89-120 Hypoglycemia: in the middle of the night sugar dropped to 74.   Most recent A1C: 7.1% Current medications: Glipizide 10 mg twice a day, farxiga 10 mg daily, and insulin glargine 20 U before bed. . Last Eye Exam: 01/16/2022. Foot checks: wound care visits weekly and dressing changes to right heel every other day.    Hyperlipidemia: Current medications: Atorvastatin 10 mg daily.  Hypertension: Current medications: Carvedilol 6.25 mg twice a day.  GERD: Famotidine 40 mg daily.  Afib: Taking Amiodarone 200 mg everyday, carvedilol 6.25 mg twice daily. xarelto 15 mg every evening.  Hypothyroidism: He takes Levothyroxine 100 mcg every morning.  Diet: eating healthy. Exercise: his eye issue has prevented him from going to the gym.    Review of Systems  Constitutional:  Negative for chills, fever and malaise/fatigue.  HENT:  Negative for ear pain, sinus pain and sore throat.   Respiratory:  Negative for cough and shortness of breath.   Cardiovascular:  Negative for chest pain.  Musculoskeletal:  Positive for back pain. Negative for myalgias.  Neurological:  Positive for tremors. Negative for headaches.  Psychiatric/Behavioral:  Negative for depression. The patient is not nervous/anxious.    Physical Exam Constitutional:      Appearance: Normal appearance.  HENT:     Nose: Nose normal. No congestion or rhinorrhea.     Mouth/Throat:     Mouth: Mucous membranes are moist.     Pharynx: No oropharyngeal exudate or posterior oropharyngeal erythema.  Cardiovascular:     Rate and Rhythm: Normal rate and regular rhythm.     Heart sounds: Normal heart sounds.  Pulmonary:     Effort: Pulmonary effort is normal. No respiratory distress.     Breath sounds: Normal breath sounds. No wheezing, rhonchi or rales.  Abdominal:     General: Bowel sounds are normal.     Palpations: Abdomen is soft.      Tenderness: There is no abdominal tenderness.  Feet:     Right foot:     Skin integrity: Ulcer (right foot (followed by wound care weekly)) present.  Lymphadenopathy:     Cervical: No cervical adenopathy.  Neurological:     Mental Status: He is alert.     Comments: tremors  Psychiatric:        Mood and Affect: Mood normal.        Behavior: Behavior normal.

## 2023-02-21 NOTE — Assessment & Plan Note (Signed)
Well controlled.  ?No changes to medicines. Continue Atorvastatin 10 mg daily. ?Continue to work on eating a healthy diet and exercise.  ?Labs drawn today.  ? ?

## 2023-02-21 NOTE — Assessment & Plan Note (Addendum)
Not at goal. Recommend increase levothyroxine 150 daily.  Recheck tsh and free T4 in 6-8 weeks.

## 2023-02-22 ENCOUNTER — Ambulatory Visit (INDEPENDENT_AMBULATORY_CARE_PROVIDER_SITE_OTHER): Payer: Medicare Other | Admitting: Family Medicine

## 2023-02-22 VITALS — BP 136/68 | HR 80 | Temp 97.3°F | Resp 18 | Ht 75.0 in | Wt 261.0 lb

## 2023-02-22 DIAGNOSIS — E291 Testicular hypofunction: Secondary | ICD-10-CM

## 2023-02-22 DIAGNOSIS — E1121 Type 2 diabetes mellitus with diabetic nephropathy: Secondary | ICD-10-CM | POA: Diagnosis not present

## 2023-02-22 DIAGNOSIS — E782 Mixed hyperlipidemia: Secondary | ICD-10-CM

## 2023-02-22 DIAGNOSIS — Z23 Encounter for immunization: Secondary | ICD-10-CM | POA: Diagnosis not present

## 2023-02-22 DIAGNOSIS — I4819 Other persistent atrial fibrillation: Secondary | ICD-10-CM | POA: Diagnosis not present

## 2023-02-22 DIAGNOSIS — I130 Hypertensive heart and chronic kidney disease with heart failure and stage 1 through stage 4 chronic kidney disease, or unspecified chronic kidney disease: Secondary | ICD-10-CM

## 2023-02-22 DIAGNOSIS — J9611 Chronic respiratory failure with hypoxia: Secondary | ICD-10-CM | POA: Diagnosis not present

## 2023-02-22 DIAGNOSIS — G4733 Obstructive sleep apnea (adult) (pediatric): Secondary | ICD-10-CM

## 2023-02-22 DIAGNOSIS — N1832 Chronic kidney disease, stage 3b: Secondary | ICD-10-CM | POA: Diagnosis not present

## 2023-02-22 DIAGNOSIS — E039 Hypothyroidism, unspecified: Secondary | ICD-10-CM

## 2023-02-22 DIAGNOSIS — K219 Gastro-esophageal reflux disease without esophagitis: Secondary | ICD-10-CM | POA: Diagnosis not present

## 2023-02-22 NOTE — Patient Instructions (Addendum)
Start mounjaro 2.5 mg weekly.  Please call in 3 weeks and if tolerating 2.5 mg, we will increase to 5 mg weekly, then the following month increase to 7.5 mg weekly.

## 2023-02-23 ENCOUNTER — Other Ambulatory Visit: Payer: Self-pay

## 2023-02-23 DIAGNOSIS — E039 Hypothyroidism, unspecified: Secondary | ICD-10-CM

## 2023-02-23 LAB — CBC WITH DIFFERENTIAL/PLATELET
Basophils Absolute: 0.1 10*3/uL (ref 0.0–0.2)
Basos: 2 %
EOS (ABSOLUTE): 0.1 10*3/uL (ref 0.0–0.4)
Eos: 3 %
Hematocrit: 34.6 % — ABNORMAL LOW (ref 37.5–51.0)
Hemoglobin: 11.5 g/dL — ABNORMAL LOW (ref 13.0–17.7)
Immature Grans (Abs): 0 10*3/uL (ref 0.0–0.1)
Immature Granulocytes: 0 %
Lymphocytes Absolute: 0.6 10*3/uL — ABNORMAL LOW (ref 0.7–3.1)
Lymphs: 15 %
MCH: 32.6 pg (ref 26.6–33.0)
MCHC: 33.2 g/dL (ref 31.5–35.7)
MCV: 98 fL — ABNORMAL HIGH (ref 79–97)
Monocytes Absolute: 0.5 10*3/uL (ref 0.1–0.9)
Monocytes: 12 %
Neutrophils Absolute: 2.8 10*3/uL (ref 1.4–7.0)
Neutrophils: 68 %
Platelets: 165 10*3/uL (ref 150–450)
RBC: 3.53 x10E6/uL — ABNORMAL LOW (ref 4.14–5.80)
RDW: 13.7 % (ref 11.6–15.4)
WBC: 4.2 10*3/uL (ref 3.4–10.8)

## 2023-02-23 LAB — COMPREHENSIVE METABOLIC PANEL
ALT: 14 IU/L (ref 0–44)
AST: 18 IU/L (ref 0–40)
Albumin: 3.7 g/dL (ref 3.7–4.7)
Alkaline Phosphatase: 166 IU/L — ABNORMAL HIGH (ref 44–121)
BUN/Creatinine Ratio: 12 (ref 10–24)
BUN: 20 mg/dL (ref 8–27)
Bilirubin Total: 0.7 mg/dL (ref 0.0–1.2)
CO2: 20 mmol/L (ref 20–29)
Calcium: 8.5 mg/dL — ABNORMAL LOW (ref 8.6–10.2)
Chloride: 107 mmol/L — ABNORMAL HIGH (ref 96–106)
Creatinine, Ser: 1.66 mg/dL — ABNORMAL HIGH (ref 0.76–1.27)
Globulin, Total: 2.6 g/dL (ref 1.5–4.5)
Glucose: 141 mg/dL — ABNORMAL HIGH (ref 70–99)
Potassium: 4.9 mmol/L (ref 3.5–5.2)
Sodium: 140 mmol/L (ref 134–144)
Total Protein: 6.3 g/dL (ref 6.0–8.5)
eGFR: 40 mL/min/{1.73_m2} — ABNORMAL LOW (ref >59)

## 2023-02-23 LAB — T4, FREE: Free T4: 1.64 ng/dL (ref 0.82–1.77)

## 2023-02-23 LAB — HEMOGLOBIN A1C
Est. average glucose Bld gHb Est-mCnc: 151 mg/dL
Hgb A1c MFr Bld: 6.9 % — ABNORMAL HIGH (ref 4.8–5.6)

## 2023-02-23 LAB — TSH: TSH: 6.82 u[IU]/mL — ABNORMAL HIGH (ref 0.450–4.500)

## 2023-02-23 MED ORDER — LEVOTHYROXINE SODIUM 150 MCG PO TABS: 150.0000 ug | ORAL_TABLET | Freq: Every day | ORAL | 1 refills | Status: DC

## 2023-02-24 NOTE — Progress Notes (Unsigned)
Subjective:  Patient ID: Paul Kent, male    DOB: [birth year]  Age: 82 y.o. MRN: [id number]  Chief Complaint  Patient presents with   Medical Management of Chronic Issues    HPI   Diabetes:  Complications: neuropathy Glucose checking:once daily in am.  Glucose logs: 89-120 Hypoglycemia: in the middle of the night sugar dropped to 74.   Most recent A1C: 7.1% Current medications: Glipizide 10 mg twice a day, farxiga 10 mg daily, and insulin glargine 20 U before bed. .Stopped ozempic due to gi upset. Stopped one week ago.  Last Eye Exam: 01/16/2022. Foot checks: wound care visits weekly and dressing changes to right heel every other day.     Hyperlipidemia: Current medications: Atorvastatin 10 mg daily.   Hypertension/CHF: Current medications: Carvedilol 6.25 mg twice a day. Torsemide 20 mg daily as needed.    GERD: Famotidine 40 mg daily.   Afib: Taking Amiodarone 200 mg everyday, carvedilol 6.25 mg twice daily. xarelto 15 mg every evening.   Hypothyroidism: He takes Levothyroxine 100 mcg every morning.   Diet: eating healthy. Patient is concerned about his weight.  Exercise: his right foot ulcer has prevented him from going to the gym.       02/06/2023   10:53 AM 10/26/2022    2:35 PM 02/13/2022    9:10 AM 11/24/2021    9:00 AM 04/06/2021   10:56 AM  Depression screen PHQ 2/9  Decreased Interest 0 0 0 0 0  Down, Depressed, Hopeless 0 0 0 0 0  PHQ - 2 Score 0 0 0 0 0  Altered sleeping  0     Tired, decreased energy  0     Change in appetite  0     Feeling bad or failure about yourself   0     Trouble concentrating  0     Moving slowly or fidgety/restless  0     Suicidal thoughts  0     PHQ-9 Score  0     Difficult doing work/chores  Not difficult at all           02/22/2023    9:03 AM  Fall Risk   Falls in the past year? 1  Number falls in past yr: 0  Injury with Fall? 1  Risk for fall due to : History of fall(s)  Follow up Falls evaluation completed;Falls  prevention discussed    Patient Care Team: Blane Ohara, MD as PCP - General (Internal Medicine) Regan Lemming, MD as PCP - Electrophysiology (Cardiology) Georgeanna Lea, MD as PCP - Cardiology (Cardiology) Debroah Baller, MD as Consulting Physician (Urology) Ulice Brilliant, MD (Orthopedic Surgery) Cherly Beach, MD as Referring Physician (Family Medicine) Evelena Leyden, OD (Optometry) Zettie Pho, [facility] (Inactive) as Pharmacist (Pharmacist)   Review of Systems  Constitutional:  Negative for chills, fatigue, fever and unexpected weight change.  HENT:  Negative for congestion, ear pain, sinus pain and sore throat.   Respiratory:  Negative for cough and shortness of breath.   Cardiovascular:  Negative for chest pain and palpitations.  Gastrointestinal:  Negative for abdominal pain, blood in stool, constipation, diarrhea, nausea and vomiting.  Endocrine: Negative for polydipsia.  Genitourinary:  Negative for dysuria.  Musculoskeletal:  Positive for back pain.  Skin:  Negative for rash.  Neurological:  Positive for tremors. Negative for headaches.    Current Outpatient Medications on File Prior to Visit  Medication Sig Dispense Refill   amiodarone (PACERONE) 200  MG tablet Take 1 tablet by mouth twice a day for 14 days then reduce back to once a day (Patient taking differently: Take 200 mg by mouth 2 (two) times daily. Take 1 tablet by mouth twice a day for 14 days then reduce back to once a day) 120 tablet 1   atorvastatin (LIPITOR) 10 MG tablet TAKE ONE TABLET BY MOUTH EVERY EVENING 90 tablet 0   B-D UF III MINI PEN NEEDLES 31G X 5 MM MISC USE AS DIRECTED EVERY DAY to inject insulin (Patient taking differently: 1 each by Other route daily.) 100 each 1   carvedilol (COREG) 6.25 MG tablet Take 1 tablet (6.25 mg total) by mouth 2 (two) times daily with a meal. 60 tablet 3   Continuous Blood Gluc Sensor (DEXCOM G7 SENSOR) MISC To use as directed to monitor glucose  (Patient taking differently: 1 each by Other route daily. To use as directed to monitor glucose) 3 each 3   famotidine (PEPCID) 40 MG tablet TAKE ONE TABLET BY MOUTH DAILY 90 tablet 0   gabapentin (NEURONTIN) 300 MG capsule TAKE ONE CAPSULE BY MOUTH EVERY DAY and TAKE ONE CAPSULE BY MOUTH AT BEDTIME (Patient taking differently: Take 300 mg by mouth at bedtime.) 180 capsule 1   glipiZIDE (GLUCOTROL XL) 10 MG 24 hr tablet TAKE ONE TABLET BY MOUTH TWICE DAILY 180 tablet 1   primidone (MYSOLINE) 50 MG tablet Take 1 tablet (50 mg total) by mouth 2 (two) times daily. (Patient taking differently: Take 50 mg by mouth daily.) 180 tablet 3   Rivaroxaban (XARELTO) 15 MG TABS tablet Take 1 tablet (15 mg total) by mouth daily with supper. 30 tablet 2   tamsulosin (FLOMAX) 0.4 MG CAPS capsule Take 1 capsule (0.4 mg total) by mouth daily after supper. 30 capsule 1   testosterone cypionate (DEPOTESTOSTERONE CYPIONATE) 200 MG/ML injection INJECT 200MG  (1 MILLILITER) into the muscle EVERY 14 DAYS (Patient taking differently: Inject 200 mg into the muscle every 14 (fourteen) days.) 10 mL 0   torsemide (DEMADEX) 20 MG tablet Take 20 mg by mouth daily as needed (For weight gain of 3 pds in 1 day).     TOUJEO SOLOSTAR 300 UNIT/ML Solostar Pen Inject 20 Units into the skin daily at 6 (six) AM. (Patient taking differently: Inject 20 Units into the skin daily.) 4.5 mL 0   No current facility-administered medications on file prior to visit.   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) 04/26/2021   Corns and  callosities    Deviated septum 07/01/2018   Deviated septum 07/01/2018   Diabetes mellitus due to underlying condition with unspecified complications (HCC) 06/08/2016   Diabetic ulcer of right heel associated with diabetes mellitus due to underlying condition, limited to breakdown of skin (HCC) 07/31/2021   Disturbances of salivary secretion    Epistaxis    Essential hypertension    Essential tremor    H pylori ulcer    H pylori ulcer    Herpes zoster with nervous system  complication 06/03/2013   Hesitancy of micturition    Hyperlipidemia    Hypothyroidism    Impacted cerumen, bilateral    Jaw pain    Lesion of femoral nerve 04/30/2013   Localized edema    Male erectile disorder    Malignant 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)    Nasal congestion 07/01/2018   Nasal turbinate hypertrophy 07/01/2018   Obstructive sleep apnea 01/17/2017   Other constipation    Other fatigue    Overweight    Pacemaker reprogramming/check 02/12/2017   Pain in right finger(s)    Pain in right lower leg    Paroxysmal atrial fibrillation (HCC) 06/08/2016   Peptic ulcer disease    Persistent atrial fibrillation (HCC) 06/01/2017   Primary insomnia    Renal stones    Renal stones    Secondary hypercoagulable state (HCC) 06/17/2020   Shingles 2014   WITH COMPLICATIONS (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)   Shingles 06/2012   Shortness of breath    Sick sinus syndrome (HCC)    Sinus bradycardia 03/07/2017   Sleep apnea    dx 08/2015, CPAP   Spontaneous ecchymoses    Status post ablation of atrial fibrillation 09/16/2020   Testicular hypofunction    Thoracic or lumbosacral neuritis or radiculitis 06/03/2013   Trigger middle finger of right hand 11/28/2019   Trigger middle finger of right hand 11/28/2019   Type 2 diabetes mellitus with circulatory disorder, 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   Past Surgical History:  Procedure Laterality  Date   ABLATION  06/2017   ATRIAL FIBRILLATION ABLATION N/A 05/21/2020   Procedure: ATRIAL FIBRILLATION ABLATION;  Surgeon: Regan Lemming, MD;  Location: [facility] INVASIVE CV LAB;  Service: Cardiovascular;  Laterality: N/A;   CARDIOVERSION N/A 03/22/2018   Procedure: CARDIOVERSION;  Surgeon: Jodelle Red, MD;  Location: [facility] ENDOSCOPY;  Service: Cardiovascular;  Laterality: N/A;   CARDIOVERSION N/A 07/08/2020   Procedure: CARDIOVERSION;  Surgeon: Jake Bathe, MD;  Location: [facility] ENDOSCOPY;  Service: Cardiovascular;  Laterality: N/A;   HEMIARTHROPLASTY HIP  10/31/2013   IT HIP BIPOLAR   LUMBAR SPINE SURGERY     PACEMAKER PLACEMENT  01/29/2017   SHOULDER SURGERY Right     Family History  Problem Relation Age of Onset   Heart attack Mother    Transient ischemic attack Mother    Heart Problems Father    Tuberculosis Brother    Diabetes type II Other    Hyperlipidemia Other    Congestive Heart Failure Other    Arthritis Other    Social History   Socioeconomic History   Marital status: Married    Spouse name: Kennon Rounds   Number of children: 3   Years of education: Not on file   Highest education level: Not on file  Occupational History   Occupation: RETIRED    Comment: school principal  Tobacco Use   Smoking status: Never   Smokeless tobacco: Never  Vaping Use   Vaping status: Never Used  Substance and Sexual Activity   Alcohol use: Not Currently   Drug use: Never   Sexual activity: Not on file  Other Topics Concern   Not on file  Social History Narrative   Lives with wife   Social Determinants of Health   Financial Resource Strain: Low Risk  (11/24/2021)   Overall Financial Resource Strain (CARDIA)    Difficulty of Paying Living Expenses: Not hard at all  Food Insecurity: No Food  Insecurity (11/24/2021)   Hunger Vital Sign    Worried About Running Out of Food in the Last Year: Never true    Ran Out of Food in the Last Year: Never true  Transportation Needs: No  Transportation Needs (11/24/2021)   PRAPARE - Administrator, Civil Service (Medical): No    Lack of Transportation (Non-Medical): No  Physical Activity: Sufficiently Active (11/24/2021)   Exercise Vital Sign    Days of Exercise per Week: 4 days    Minutes of Exercise per Session: 60 min  Stress: No Stress Concern Present (11/24/2021)   [employer] of Occupational Health - Occupational Stress Questionnaire    Feeling of Stress : Not at all  Social Connections: Socially Integrated (11/24/2021)   Social Connection and Isolation Panel [NHANES]    Frequency of Communication with Friends and Family: More than three times a week    Frequency of Social Gatherings with Friends and Family: More than three times a week    Attends Religious Services: More than 4 times per year    Active Member of [employer] or Organizations: Yes    Attends Engineer, structural: More than 4 times per year    Marital Status: Married    Objective:  BP 136/68   Pulse 80   Temp (!) 97.3 F (36.3 C)   Resp 18   Ht 6\' 3"  (1.905 m)   Wt 261 lb (118.4 kg)   BMI 32.62 kg/m      02/22/2023    8:51 AM 02/07/2023   11:15 AM 02/06/2023   11:19 AM  BP/Weight  Systolic BP 136 112 94  Diastolic BP 68 50 50  Wt. (Lbs) 261 263.6   BMI 32.62 kg/m2 32.95 kg/m2     Physical Exam Vitals reviewed.  Constitutional:      Appearance: Normal appearance.  Neck:     Vascular: No carotid bruit.  Cardiovascular:     Rate and Rhythm: Normal rate. Rhythm irregular.     Pulses: Normal pulses.     Heart sounds: Normal heart sounds.  Pulmonary:     Effort: Pulmonary effort is normal.     Breath sounds: Normal breath sounds. No wheezing, rhonchi or rales.  Abdominal:     General: Bowel sounds are normal.     Palpations: Abdomen is soft.     Tenderness: There is no abdominal tenderness.  Neurological:     Mental Status: He is alert.  Psychiatric:        Mood and Affect: Mood normal.        Behavior: Behavior  normal.     Diabetic Foot Exam - Simple   Simple Foot Form  02/22/2023  3:31 PM  Visual Inspection See comments: Yes Sensation Testing See comments: Yes Pulse Check Posterior Tibialis and Dorsalis pulse intact bilaterally: Yes Comments Poor sensation left foot . Thickened nails, dry skin.  Right foot is covered and in a walking boot.      Lab Results  Component Value Date   WBC 4.2 02/22/2023   HGB 11.5 (L) 02/22/2023   HCT 34.6 (L) 02/22/2023   PLT 165 02/22/2023   GLUCOSE 141 (H) 02/22/2023   CHOL 115 11/07/2022   TRIG 70 11/07/2022   HDL 45 11/07/2022   LDLCALC 55 11/07/2022   ALT 14 02/22/2023   AST 18 02/22/2023   NA 140 02/22/2023   K 4.9 02/22/2023   CL 107 (H) 02/22/2023   CREATININE 1.66 (H) 02/22/2023  BUN 20 02/22/2023   CO2 20 02/22/2023   TSH 6.820 (H) 02/22/2023   HGBA1C 6.9 (H) 02/22/2023   MICROALBUR 80 01/20/2021      Assessment & Plan:    Hypertensive heart and renal disease, stage 1-4 or unspecified chronic kidney disease, with heart failure (HCC) Assessment & Plan: Well controlled.  Continue carvedilol to 6.25 mg twice daily and torsemide 20 mg daily as needed .   Orders: -     CBC with Differential/Platelet -     Comprehensive metabolic panel  OSA on CPAP Assessment & Plan: Conitnue wih CPAP with oxygen 2 L at night.    Gastroesophageal reflux disease without esophagitis Assessment & Plan: The current medical regimen is effective;  continue present plan and medications. Continue pepcid 40 mg daily.    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 Not at goal Recommend check sugars fasting daily. Recommend check feet daily. Recommend annual eye exams. Medicines: Start mounjaro 2.5 mg weekly. Glipizide 10 mg twice a day, farxiga 10 mg daily, and insulin glargine 20 U before bed. Intolerant to ozempic. Continue to work on eating a healthy diet and exercise.  Labs drawn today  Orders: -     Hemoglobin  A1c -     Microalbumin / creatinine urine ratio  Acquired hypothyroidism Assessment & Plan: Not at goal. Recommend increase levothyroxine 150 daily.  Recheck tsh and free T4 in 6-8 weeks.   Orders: -     TSH -     T4, free  Mixed hyperlipidemia Assessment & Plan: Well controlled.  No changes to medicines. Continue Atorvastatin 10 mg daily. Continue to work on eating a healthy diet and exercise.  Labs drawn today.     Hypogonadism in male  Need for influenza vaccination -     Flu Vaccine Trivalent High Dose (Fluad)  Chronic respiratory failure with hypoxia (HCC) Assessment & Plan: Continue with oxygen 2 L at night.    Stage 3b chronic kidney disease (HCC) Assessment & Plan: Stable.   Persistent atrial fibrillation (HCC) Assessment & Plan: Continue taking Amiodarone 200 mg everyday, carvedilol 6.25 mg twice daily. xarelto 15 mg every evening.      No orders of the defined types were placed in this encounter.   Orders Placed This Encounter  Procedures   Flu Vaccine Trivalent High Dose (Fluad)   CBC with Differential/Platelet   Comprehensive metabolic panel   Hemoglobin A1c   TSH   T4, free   Microalbumin / creatinine urine ratio     Follow-up: Return in about 3 months (around 05/24/2023) for chronic follow up.   I,Marla I Leal-Borjas,acting as a scribe for Blane Ohara, MD.,have documented all relevant documentation on the behalf of Blane Ohara, MD,as directed by  Blane Ohara, MD while in the presence of Blane Ohara, MD.   An After Visit Summary was printed and given to the patient.  Blane Ohara, MD [facility] [phone]

## 2023-02-25 ENCOUNTER — Other Ambulatory Visit: Payer: Self-pay | Admitting: Family Medicine

## 2023-02-25 ENCOUNTER — Encounter: Payer: Self-pay | Admitting: Family Medicine

## 2023-02-25 DIAGNOSIS — Z23 Encounter for immunization: Secondary | ICD-10-CM | POA: Insufficient documentation

## 2023-02-25 MED ORDER — TIRZEPATIDE 2.5 MG/0.5ML ~~LOC~~ SOAJ: 2.5000 mg | SUBCUTANEOUS | 0 refills | Status: DC

## 2023-02-25 NOTE — Assessment & Plan Note (Signed)
Stable

## 2023-02-25 NOTE — Assessment & Plan Note (Signed)
Continue taking Amiodarone 200 mg everyday, carvedilol 6.25 mg twice daily. xarelto 15 mg every evening.

## 2023-02-25 NOTE — Assessment & Plan Note (Signed)
Continue with oxygen 2 L at night.

## 2023-02-26 DIAGNOSIS — L97411 Non-pressure chronic ulcer of right heel and midfoot limited to breakdown of skin: Secondary | ICD-10-CM | POA: Diagnosis not present

## 2023-02-26 DIAGNOSIS — L89613 Pressure ulcer of right heel, stage 3: Secondary | ICD-10-CM | POA: Diagnosis not present

## 2023-02-27 DIAGNOSIS — H02403 Unspecified ptosis of bilateral eyelids: Secondary | ICD-10-CM | POA: Diagnosis not present

## 2023-02-27 DIAGNOSIS — H02831 Dermatochalasis of right upper eyelid: Secondary | ICD-10-CM | POA: Diagnosis not present

## 2023-02-27 DIAGNOSIS — H02834 Dermatochalasis of left upper eyelid: Secondary | ICD-10-CM | POA: Diagnosis not present

## 2023-03-05 DIAGNOSIS — L89613 Pressure ulcer of right heel, stage 3: Secondary | ICD-10-CM | POA: Diagnosis not present

## 2023-03-12 ENCOUNTER — Other Ambulatory Visit: Payer: Self-pay | Admitting: Family Medicine

## 2023-03-12 DIAGNOSIS — L89613 Pressure ulcer of right heel, stage 3: Secondary | ICD-10-CM | POA: Diagnosis not present

## 2023-03-15 ENCOUNTER — Telehealth: Payer: Self-pay

## 2023-03-15 ENCOUNTER — Ambulatory Visit: Payer: Medicare Other | Admitting: Family Medicine

## 2023-03-15 ENCOUNTER — Encounter: Payer: Self-pay | Admitting: Family Medicine

## 2023-03-15 VITALS — BP 112/58 | HR 85 | Temp 97.7°F | Resp 18 | Ht 75.0 in | Wt 261.0 lb

## 2023-03-15 DIAGNOSIS — S32592A Other specified fracture of left pubis, initial encounter for closed fracture: Secondary | ICD-10-CM | POA: Diagnosis not present

## 2023-03-15 DIAGNOSIS — W1830XA Fall on same level, unspecified, initial encounter: Secondary | ICD-10-CM | POA: Diagnosis not present

## 2023-03-15 DIAGNOSIS — Z043 Encounter for examination and observation following other accident: Secondary | ICD-10-CM | POA: Diagnosis not present

## 2023-03-15 DIAGNOSIS — M978XXA Periprosthetic fracture around other internal prosthetic joint, initial encounter: Secondary | ICD-10-CM | POA: Diagnosis not present

## 2023-03-15 DIAGNOSIS — W19XXXA Unspecified fall, initial encounter: Secondary | ICD-10-CM | POA: Diagnosis not present

## 2023-03-15 DIAGNOSIS — S72392A Other fracture of shaft of left femur, initial encounter for closed fracture: Secondary | ICD-10-CM | POA: Diagnosis not present

## 2023-03-15 DIAGNOSIS — M9702XA Periprosthetic fracture around internal prosthetic left hip joint, initial encounter: Secondary | ICD-10-CM | POA: Diagnosis not present

## 2023-03-15 DIAGNOSIS — Z79899 Other long term (current) drug therapy: Secondary | ICD-10-CM | POA: Diagnosis not present

## 2023-03-15 DIAGNOSIS — M25552 Pain in left hip: Secondary | ICD-10-CM | POA: Diagnosis not present

## 2023-03-15 DIAGNOSIS — S72112A Displaced fracture of greater trochanter of left femur, initial encounter for closed fracture: Secondary | ICD-10-CM | POA: Diagnosis not present

## 2023-03-15 DIAGNOSIS — S61412A Laceration without foreign body of left hand, initial encounter: Secondary | ICD-10-CM

## 2023-03-15 DIAGNOSIS — S72001K Fracture of unspecified part of neck of right femur, subsequent encounter for closed fracture with nonunion: Secondary | ICD-10-CM | POA: Diagnosis not present

## 2023-03-15 DIAGNOSIS — Z794 Long term (current) use of insulin: Secondary | ICD-10-CM | POA: Diagnosis not present

## 2023-03-15 DIAGNOSIS — Z7984 Long term (current) use of oral hypoglycemic drugs: Secondary | ICD-10-CM | POA: Diagnosis not present

## 2023-03-15 NOTE — Assessment & Plan Note (Signed)
Primary concern is the patient has a fracture of his left hip and/or pelvis.  Will send by ambulance since he has significant difficulty ambulating and transferring to and from the car.  He required max assist.

## 2023-03-15 NOTE — Assessment & Plan Note (Signed)
Unclear etiology of fall. No head injury.

## 2023-03-15 NOTE — Progress Notes (Signed)
Acute Office Visit  Subjective:    Patient ID: Paul Kent, male    DOB: 06/11/1941, 82 y.o.   MRN: [id number]  Chief Complaint  Patient presents with   Left Leg Pain   HPI: Patient is an 82 yo male with distant history of a left hip fracture (2015) and pelvic fracture, CHF, Chronic respiratory failure with hypoxia, diabetes, atrial fibrillaiton, hyperlipidemia, and hypertensive CKD 3B who present with left leg and left hip pain following a fall on Tuesday in his home. He denies hitting his head.  No chest pain or palpitations reported.  He has been sleeping a lot since his fall, although his wife feels the somnolence started on Monday.  Left wrist: abrasion. - laid skin over when he fell.  His wife helped him up and he was able to use a bench also. Patient is trying to walk with a walker. It hurts when he walks on it. 8/10. When I asked him to move to the exam table which was 2 feet he said he could not walk to it.  Patient has been taking Tylenol which may or may not be really helping.  Increased somnolence since Monday.  Diabetes: Sugars 79-84. 110-120 usually.  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) 04/26/2021   Corns and callosities    Deviated septum 07/01/2018   Deviated septum 07/01/2018   Diabetes mellitus due to underlying condition with unspecified complications (HCC) 06/08/2016   Diabetic ulcer of right heel associated with diabetes mellitus due to underlying condition, limited to breakdown of skin (HCC) 07/31/2021   Disturbances of salivary secretion     Epistaxis    Essential hypertension    Essential tremor    H pylori ulcer    H pylori ulcer    Herpes zoster with nervous system complication 06/03/2013   Hesitancy of micturition    Hyperlipidemia    Hypothyroidism    Impacted cerumen, bilateral    Jaw pain    Lesion of femoral nerve 04/30/2013   Localized edema    Male erectile disorder    Malignant 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)    Nasal congestion 07/01/2018   Nasal turbinate hypertrophy 07/01/2018   Obstructive sleep apnea 01/17/2017   Other constipation    Other fatigue    Overweight    Pacemaker reprogramming/check 02/12/2017   Pain in right finger(s)    Pain in right lower leg    Paroxysmal atrial fibrillation (HCC) 06/08/2016   Peptic ulcer disease    Persistent atrial fibrillation (HCC) 06/01/2017   Primary insomnia    Renal stones    Renal stones    Secondary hypercoagulable state (HCC) 06/17/2020   Shingles 2014   WITH COMPLICATIONS (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)   Shingles 06/2012   Shortness of breath    Sick sinus syndrome (HCC)    Sinus bradycardia 03/07/2017   Sleep apnea    dx 08/2015, CPAP   Spontaneous ecchymoses    Status post  ablation of atrial fibrillation 09/16/2020   Testicular hypofunction    Thoracic or lumbosacral neuritis or radiculitis 06/03/2013   Trigger middle finger of right hand 11/28/2019   Trigger middle finger of right hand 11/28/2019   Type 2 diabetes mellitus with circulatory disorder, 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    Past Surgical History:  Procedure Laterality Date   ABLATION  06/2017   ATRIAL FIBRILLATION ABLATION N/A 05/21/2020   Procedure: ATRIAL FIBRILLATION ABLATION;  Surgeon: Regan Lemming, MD;  Location: [facility] INVASIVE CV LAB;  Service: Cardiovascular;  Laterality: N/A;   CARDIOVERSION N/A 03/22/2018   Procedure: CARDIOVERSION;  Surgeon: Jodelle Red, MD;  Location: [facility] ENDOSCOPY;   Service: Cardiovascular;  Laterality: N/A;   CARDIOVERSION N/A 07/08/2020   Procedure: CARDIOVERSION;  Surgeon: Jake Bathe, MD;  Location: [facility] ENDOSCOPY;  Service: Cardiovascular;  Laterality: N/A;   HEMIARTHROPLASTY HIP  10/31/2013   IT HIP BIPOLAR   LUMBAR SPINE SURGERY     PACEMAKER PLACEMENT  01/29/2017   SHOULDER SURGERY Right     Family History  Problem Relation Age of Onset   Heart attack Mother    Transient ischemic attack Mother    Heart Problems Father    Tuberculosis Brother    Diabetes type II Other    Hyperlipidemia Other    Congestive Heart Failure Other    Arthritis Other     Social History   Socioeconomic History   Marital status: Married    Spouse name: Kennon Rounds   Number of children: 3   Years of education: Not on file   Highest education level: Not on file  Occupational History   Occupation: RETIRED    Comment: school principal  Tobacco Use   Smoking status: Never   Smokeless tobacco: Never  Vaping Use   Vaping status: Never Used  Substance and Sexual Activity   Alcohol use: Not Currently   Drug use: Never   Sexual activity: Not on file  Other Topics Concern   Not on file  Social History Narrative   Lives with wife   Social Determinants of Health   Financial Resource Strain: Low Risk  (11/24/2021)   Overall Financial Resource Strain (CARDIA)    Difficulty of Paying Living Expenses: Not hard at all  Food Insecurity: No Food Insecurity (11/24/2021)   Hunger Vital Sign    Worried About Running Out of Food in the Last Year: Never true    Ran Out of Food in the Last Year: Never true  Transportation Needs: No Transportation Needs (11/24/2021)   PRAPARE - Administrator, Civil Service (Medical): No    Lack of Transportation (Non-Medical): No  Physical Activity: Sufficiently Active (11/24/2021)   Exercise Vital Sign    Days of Exercise per Week: 4 days    Minutes of Exercise per Session: 60 min  Stress: No Stress Concern Present (11/24/2021)    [employer] of Occupational Health - Occupational Stress Questionnaire    Feeling of Stress : Not at all  Social Connections: Socially Integrated (11/24/2021)   Social Connection and Isolation Panel [NHANES]    Frequency of Communication with Friends and Family: More than three times a week    Frequency of Social Gatherings with Friends and Family: More than three times a week    Attends Religious Services: More than 4 times per year    Active Member of [employer] or Organizations: Yes    Attends Banker Meetings: More than 4 times  per year    Marital Status: Married  Catering manager Violence: Not At Risk (11/24/2021)   Humiliation, Afraid, Rape, and Kick questionnaire    Fear of Current or Ex-Partner: No    Emotionally Abused: No    Physically Abused: No    Sexually Abused: No    Outpatient Medications Prior to Visit  Medication Sig Dispense Refill   amiodarone (PACERONE) 200 MG tablet Take 1 tablet by mouth twice a day for 14 days then reduce back to once a day (Patient taking differently: Take 200 mg by mouth 2 (two) times daily. Take 1 tablet by mouth twice a day for 14 days then reduce back to once a day) 120 tablet 1   atorvastatin (LIPITOR) 10 MG tablet TAKE ONE TABLET BY MOUTH EVERY EVENING 90 tablet 0   B-D UF III MINI PEN NEEDLES 31G X 5 MM MISC USE AS DIRECTED EVERY DAY to inject insulin (Patient taking differently: 1 each by Other route daily.) 100 each 1   carvedilol (COREG) 6.25 MG tablet Take 1 tablet (6.25 mg total) by mouth 2 (two) times daily with a meal. 60 tablet 3   Continuous Blood Gluc Sensor (DEXCOM G7 SENSOR) MISC To use as directed to monitor glucose (Patient taking differently: 1 each by Other route daily. To use as directed to monitor glucose) 3 each 3   famotidine (PEPCID) 40 MG tablet TAKE ONE TABLET BY MOUTH DAILY 90 tablet 0   gabapentin (NEURONTIN) 300 MG capsule TAKE ONE CAPSULE BY MOUTH EVERY DAY and TAKE ONE CAPSULE BY MOUTH AT BEDTIME  (Patient taking differently: Take 300 mg by mouth at bedtime.) 180 capsule 1   glipiZIDE (GLUCOTROL XL) 10 MG 24 hr tablet TAKE ONE TABLET BY MOUTH TWICE DAILY 180 tablet 1   levothyroxine (SYNTHROID) 137 MCG tablet TAKE ONE TABLET BY MOUTH EVERY MORNING 90 tablet 0   levothyroxine (SYNTHROID) 150 MCG tablet Take 1 tablet (150 mcg total) by mouth daily. 90 tablet 1   primidone (MYSOLINE) 50 MG tablet TAKE ONE TABLET BY MOUTH EVERY DAY and TAKE ONE TABLET BY MOUTH AT BEDTIME 180 tablet 0   Rivaroxaban (XARELTO) 15 MG TABS tablet Take 1 tablet (15 mg total) by mouth daily with supper. 30 tablet 2   tamsulosin (FLOMAX) 0.4 MG CAPS capsule Take 1 capsule (0.4 mg total) by mouth daily after supper. 30 capsule 1   testosterone cypionate (DEPOTESTOSTERONE CYPIONATE) 200 MG/ML injection INJECT 200MG  (1 MILLILITER) into the muscle EVERY 14 DAYS (Patient taking differently: Inject 200 mg into the muscle every 14 (fourteen) days.) 10 mL 0   tirzepatide (MOUNJARO) 2.5 MG/0.5ML Pen Inject 2.5 mg into the skin once a week. 2 mL 0   torsemide (DEMADEX) 20 MG tablet Take 20 mg by mouth daily as needed (For weight gain of 3 pds in 1 day).     TOUJEO SOLOSTAR 300 UNIT/ML Solostar Pen Inject 20 Units into the skin daily at 6 (six) AM. (Patient taking differently: Inject 20 Units into the skin daily.) 4.5 mL 0   No facility-administered medications prior to visit.    Allergies  Allergen Reactions   Propranolol     Drops HR too low  Other reaction(s): Other (See Comments) Drops HR too low Drops HR too low Drops HR too low   Canagliflozin     Patient had an adverse reaction Other reaction(s): Other (See Comments) Patient had an adverse reaction Patient had an adverse reaction   Clonidine Derivatives Nausea And Vomiting  Hydralazine Hcl     Patient had an adverse reaction    Metformin And Related Diarrhea   Topiramate     Patient had an adverse reaction Other reaction(s): Other (See Comments) Patient  had an adverse reaction Patient had an adverse reaction   Ambien [Zolpidem] Other (See Comments)    Amnesia    Farxiga [Dapagliflozin]     Recurrent UTIs.    Ozempic (0.25 Or 0.5 Mg-Dose) [Semaglutide(0.25 Or 0.5mg -Dos)] Other (See Comments)    Nausea, abdominal pain     Review of Systems  Constitutional:  Negative for chills and fever.  HENT:  Negative for congestion.   Respiratory:  Negative for cough.   Cardiovascular:  Negative for chest pain.  Gastrointestinal:  Negative for vomiting.  Genitourinary:  Negative for urgency.       Urinary incontinence   Musculoskeletal:  Positive for arthralgias (left hip pain). Negative for back pain and myalgias.  Neurological:  Negative for dizziness and headaches.  Psychiatric/Behavioral:  Negative for dysphoric mood. The patient is not nervous/anxious.        Objective:        03/15/2023    9:56 AM 02/22/2023    8:51 AM 02/07/2023   11:15 AM  Vitals with BMI  Height 6\' 3"  6\' 3"  6\' 3"   Weight 261 lbs 261 lbs 263 lbs 10 oz  BMI 32.62 32.62 32.95  Systolic 112 136 664  Diastolic 58 68 50  Pulse 85 80 71    No data found.   Physical Exam Vitals reviewed.  Constitutional:      Appearance: Normal appearance.  Musculoskeletal:     Comments: Patient has difficulty with quadriceps extending his left leg    Can raise his leg about 45 degrees at the knee. He had severe pain when I attempted to flex his left hip. In addition, he was very tender laterally over left trochanteric bursa.   Skin:    Comments: Skin tear of left hand. C/D/I. Redressed. Full rom of left hand and wrist.   Neurological:     Mental Status: He is alert.     Health Maintenance Due  Topic Date Due   DTaP/Tdap/Td (1 - Tdap) Never done   Medicare Annual Wellness (AWV)  11/25/2022    There are no preventive care reminders to display for this patient.   Lab Results  Component Value Date   TSH 6.820 (H) 02/22/2023   Lab Results  Component Value Date   WBC  4.2 02/22/2023   HGB 11.5 (L) 02/22/2023   HCT 34.6 (L) 02/22/2023   MCV 98 (H) 02/22/2023   PLT 165 02/22/2023   Lab Results  Component Value Date   NA 140 02/22/2023   K 4.9 02/22/2023   CO2 20 02/22/2023   GLUCOSE 141 (H) 02/22/2023   BUN 20 02/22/2023   CREATININE 1.66 (H) 02/22/2023   BILITOT 0.7 02/22/2023   ALKPHOS 166 (H) 02/22/2023   AST 18 02/22/2023   ALT 14 02/22/2023   PROT 6.3 02/22/2023   ALBUMIN 3.7 02/22/2023   CALCIUM 8.5 (L) 02/22/2023   EGFR 40 (L) 02/22/2023   Lab Results  Component Value Date   CHOL 115 11/07/2022   Lab Results  Component Value Date   HDL 45 11/07/2022   Lab Results  Component Value Date   LDLCALC 55 11/07/2022   Lab Results  Component Value Date   TRIG 70 11/07/2022   Lab Results  Component Value Date   CHOLHDL 2.6  11/07/2022   Lab Results  Component Value Date   HGBA1C 6.9 (H) 02/22/2023       Assessment & Plan:  Acute pain of left hip Assessment & Plan: Primary concern is the patient has a fracture of his left hip and/or pelvis.  Will send by ambulance since he has significant difficulty ambulating and transferring to and from the car.  He required max assist.   Fall on same level, initial encounter Assessment & Plan: Unclear etiology of fall. No head injury.    Skin tear of left hand without complication, initial encounter Assessment & Plan: Redressed.       No orders of the defined types were placed in this encounter.   No orders of the defined types were placed in this encounter.    Follow-up: No follow-ups on file.  An After Visit Summary was printed and given to the patient.  Blane Ohara, MD [facility] [phone]

## 2023-03-15 NOTE — Telephone Encounter (Signed)
   [facility] Pre-operative Risk Assessment    Request for surgical clearance:  What type of surgery is being performed? Blepharoplasty Both Upper Lids    When is this surgery scheduled? 03/30/2023   What type of clearance is required (medical clearance vs. Pharmacy clearance to hold med vs. Both)? Pharmacy  Are there any medications that need to be held prior to surgery and how long?Xarelto, hold 1 day before surgery   Practice name and name of physician performing surgery? Dr. Rozanna Box at [facility]    What is your office phone number: [phone]    7.   What is your office fax number: [phone]  8.   Anesthesia type (None, local, MAC, general) ? Not specified   Paul Kent 03/15/2023, 4:28 PM  _________________________________________________________________   (provider comments below)

## 2023-03-15 NOTE — Assessment & Plan Note (Signed)
Re-dressed

## 2023-03-15 NOTE — Telephone Encounter (Signed)
Clinical pharmacist to review Xarelto

## 2023-03-16 ENCOUNTER — Ambulatory Visit: Payer: Medicare Other

## 2023-03-16 DIAGNOSIS — I5042 Chronic combined systolic (congestive) and diastolic (congestive) heart failure: Secondary | ICD-10-CM

## 2023-03-16 LAB — CUP PACEART REMOTE DEVICE CHECK
Date Time Interrogation Session: 20240927082045
Implantable Lead Connection Status: 753985
Implantable Lead Connection Status: 753985
Implantable Lead Implant Date: 20180813
Implantable Lead Implant Date: 20180813
Implantable Lead Location: 753859
Implantable Lead Location: 753860
Implantable Lead Model: 377
Implantable Lead Model: 377
Implantable Lead Serial Number: 49893169
Implantable Lead Serial Number: 50011411
Implantable Pulse Generator Implant Date: 20180813
Pulse Gen Model: 407145
Pulse Gen Serial Number: 69158272

## 2023-03-16 NOTE — Telephone Encounter (Signed)
Patient with diagnosis of afib on Xarelto for anticoagulation.    Procedure: Blepharoplasty Both Upper Lids  Date of procedure: 03/30/2023   CHA2DS2-VASc Score = 4   This indicates a 4.8% annual risk of stroke. The patient's score is based upon: CHF History: 1 HTN History: 0 Diabetes History: 1 Stroke History: 0 Vascular Disease History: 0 Age Score: 2 Gender Score: 0     CrCl 55 mL/min Platelet count 165    Per office protocol, patient can hold Xarelto  for 1 days prior to procedure.     **This guidance is not considered finalized until pre-operative APP has relayed final recommendations.**

## 2023-03-16 NOTE — Telephone Encounter (Signed)
Name: MARCUS BOUDER  DOB: Sep 01, 1940  MRN: [id number]   Primary Cardiologist: Gypsy Balsam, MD  Chart reviewed as part of pre-operative protocol coverage.   We have been asked for guidance to hold oral anticoagulation for upcoming procedure. Per our clinical pharmacist:   Per office protocol, patient can hold Xarelto  for 1 days prior to procedure.     I will route this recommendation to the requesting party via Epic fax function and remove from pre-op pool. Please call with questions.  Roe Rutherford Sadiyah Kangas, PA 03/16/2023, 10:00 AM

## 2023-03-22 DIAGNOSIS — M978XXA Periprosthetic fracture around other internal prosthetic joint, initial encounter: Secondary | ICD-10-CM | POA: Diagnosis not present

## 2023-03-22 DIAGNOSIS — Z96649 Presence of unspecified artificial hip joint: Secondary | ICD-10-CM | POA: Diagnosis not present

## 2023-03-22 DIAGNOSIS — M25552 Pain in left hip: Secondary | ICD-10-CM | POA: Diagnosis not present

## 2023-03-22 NOTE — Progress Notes (Signed)
Remote pacemaker transmission.   

## 2023-03-26 DIAGNOSIS — L89613 Pressure ulcer of right heel, stage 3: Secondary | ICD-10-CM | POA: Diagnosis not present

## 2023-03-27 ENCOUNTER — Other Ambulatory Visit: Payer: Self-pay

## 2023-03-27 DIAGNOSIS — R3589 Other polyuria: Secondary | ICD-10-CM

## 2023-03-30 DIAGNOSIS — H02831 Dermatochalasis of right upper eyelid: Secondary | ICD-10-CM | POA: Diagnosis not present

## 2023-03-30 DIAGNOSIS — E119 Type 2 diabetes mellitus without complications: Secondary | ICD-10-CM | POA: Diagnosis not present

## 2023-03-30 DIAGNOSIS — H53453 Other localized visual field defect, bilateral: Secondary | ICD-10-CM | POA: Diagnosis not present

## 2023-03-30 DIAGNOSIS — Z01818 Encounter for other preprocedural examination: Secondary | ICD-10-CM | POA: Diagnosis not present

## 2023-03-30 DIAGNOSIS — H02403 Unspecified ptosis of bilateral eyelids: Secondary | ICD-10-CM | POA: Diagnosis not present

## 2023-03-30 DIAGNOSIS — H02834 Dermatochalasis of left upper eyelid: Secondary | ICD-10-CM | POA: Diagnosis not present

## 2023-04-03 DIAGNOSIS — L97411 Non-pressure chronic ulcer of right heel and midfoot limited to breakdown of skin: Secondary | ICD-10-CM | POA: Diagnosis not present

## 2023-04-03 DIAGNOSIS — L89613 Pressure ulcer of right heel, stage 3: Secondary | ICD-10-CM | POA: Diagnosis not present

## 2023-04-03 DIAGNOSIS — M25552 Pain in left hip: Secondary | ICD-10-CM | POA: Diagnosis not present

## 2023-04-10 ENCOUNTER — Other Ambulatory Visit: Payer: Medicare Other

## 2023-04-10 DIAGNOSIS — L97411 Non-pressure chronic ulcer of right heel and midfoot limited to breakdown of skin: Secondary | ICD-10-CM | POA: Diagnosis not present

## 2023-04-10 DIAGNOSIS — L89613 Pressure ulcer of right heel, stage 3: Secondary | ICD-10-CM | POA: Diagnosis not present

## 2023-04-17 DIAGNOSIS — L97411 Non-pressure chronic ulcer of right heel and midfoot limited to breakdown of skin: Secondary | ICD-10-CM | POA: Diagnosis not present

## 2023-04-17 DIAGNOSIS — L89613 Pressure ulcer of right heel, stage 3: Secondary | ICD-10-CM | POA: Diagnosis not present

## 2023-04-18 ENCOUNTER — Telehealth: Payer: Self-pay

## 2023-04-18 NOTE — Telephone Encounter (Signed)
Transition Care Management Unsuccessful Follow-up Telephone Call  Date of discharge and from where:  03/15/2023 [facility]  Attempts:  1st Attempt  Reason for unsuccessful TCM follow-up call:  No answer/busy  [name] [facility]  [facility], [facility] Resource Care Guide Direct Dial: [phone]  Website: [email or web address]

## 2023-04-19 ENCOUNTER — Other Ambulatory Visit: Payer: Self-pay | Admitting: Family Medicine

## 2023-04-19 ENCOUNTER — Telehealth: Payer: Self-pay

## 2023-04-19 DIAGNOSIS — E1121 Type 2 diabetes mellitus with diabetic nephropathy: Secondary | ICD-10-CM

## 2023-04-19 NOTE — Telephone Encounter (Signed)
Transition Care Management Unsuccessful Follow-up Telephone Call  Date of discharge and from where:  03/15/2023 [facility]  Attempts:  2nd Attempt  Reason for unsuccessful TCM follow-up call:  Left voice message  Mychal Decarlo [facility]  [facility], [facility] Resource Care Guide Direct Dial: [phone]  Website: [email or web address]

## 2023-04-20 ENCOUNTER — Other Ambulatory Visit: Payer: Self-pay | Admitting: Family Medicine

## 2023-04-20 MED ORDER — TIRZEPATIDE 5 MG/0.5ML ~~LOC~~ SOAJ: 5.0000 mg | SUBCUTANEOUS | 0 refills | Status: DC

## 2023-04-23 ENCOUNTER — Telehealth: Payer: Self-pay

## 2023-04-23 NOTE — Telephone Encounter (Signed)
PA for mounjaro 5 mg submitted and approved via covermymeds.

## 2023-04-24 ENCOUNTER — Other Ambulatory Visit: Payer: Self-pay | Admitting: Family Medicine

## 2023-04-24 DIAGNOSIS — L97411 Non-pressure chronic ulcer of right heel and midfoot limited to breakdown of skin: Secondary | ICD-10-CM | POA: Diagnosis not present

## 2023-04-24 DIAGNOSIS — L89613 Pressure ulcer of right heel, stage 3: Secondary | ICD-10-CM | POA: Diagnosis not present

## 2023-04-24 DIAGNOSIS — E1142 Type 2 diabetes mellitus with diabetic polyneuropathy: Secondary | ICD-10-CM

## 2023-04-27 ENCOUNTER — Other Ambulatory Visit: Payer: Self-pay

## 2023-04-30 MED ORDER — RIVAROXABAN 15 MG PO TABS: 15.0000 mg | ORAL_TABLET | Freq: Every day | ORAL | 5 refills | Status: DC

## 2023-04-30 NOTE — Telephone Encounter (Signed)
Prescription refill request for Xarelto received.  Indication:AFIB Last office visit:8/24 Weight:118.4  kg Age:82 Scr:1.66  9/24 CrCl:55.48  ml/min  Prescription refilled

## 2023-05-01 DIAGNOSIS — L89613 Pressure ulcer of right heel, stage 3: Secondary | ICD-10-CM | POA: Diagnosis not present

## 2023-05-01 DIAGNOSIS — M25552 Pain in left hip: Secondary | ICD-10-CM | POA: Diagnosis not present

## 2023-05-01 DIAGNOSIS — L97411 Non-pressure chronic ulcer of right heel and midfoot limited to breakdown of skin: Secondary | ICD-10-CM | POA: Diagnosis not present

## 2023-05-04 ENCOUNTER — Other Ambulatory Visit: Payer: Self-pay | Admitting: Family Medicine

## 2023-05-04 DIAGNOSIS — E1159 Type 2 diabetes mellitus with other circulatory complications: Secondary | ICD-10-CM

## 2023-05-07 ENCOUNTER — Other Ambulatory Visit: Payer: Self-pay | Admitting: Family Medicine

## 2023-05-07 ENCOUNTER — Ambulatory Visit: Payer: Medicare Other | Admitting: Physician Assistant

## 2023-05-07 DIAGNOSIS — K219 Gastro-esophageal reflux disease without esophagitis: Secondary | ICD-10-CM

## 2023-05-07 NOTE — Progress Notes (Unsigned)
Cardiology Office Note:  .   Date:  05/08/2023  ID:  Paul Kent, DOB 1940-08-27, MRN [id number] PCP: Paul Ohara, MD  [facility] Providers Cardiologist:  Paul Balsam, MD Electrophysiologist:  Paul Lemming, MD    History of Present Illness: .   Paul Kent is a 82 y.o. male with a past medical history of chronic atrial fibrillation s/p cryoablation 2019 > 2021, sick sinus syndrome s/p PPM, chronic heart failure, OSA on CPAP, GERD, DM 2, CKD stage IIIb, history of bladder cancer.  03/17/2023 device interrogation normal device function, 102 atrial episodes ~ 11% burden 04/22/2022 echocardiogram EF 40 to 45%, severe concentric LVH 05/16/2022 Lexiscan no evidence of ischemia, intermediate risk secondary to decreased EF which could be related to paced rhythm 10/06/2021 ABIs normal 07/08/2020 DCCV 2019 cryoablation for AF > 2021  Evaluated by Dr. Elberta Kent on 10/16/2022, he continued to have episodes of atrial fibrillation and remained on amiodarone.  Most recently evaluated Dr. Bing Kent on 02/07/2023, he was having episodes of hypotension, and his Xarelto was decreased to 15 mg daily.  He presents today accompanied by his wife for follow-up, has been doing okay from a cardiac perspective, offers no formal complaints.  His heart rate is elevated in the office today however rate appears to be regular.  He does check his heart rate at home and stated it was around 60 today.  He has been taking his torsemide on a daily basis as opposed to as needed.  His weight is down, he has no pedal edema. He denies chest pain, palpitations, dyspnea, pnd, orthopnea, n, v, dizziness, syncope, edema, weight gain, or early satiety.     ROS: Review of Systems  Constitutional: Negative.   HENT: Negative.    Eyes: Negative.   Respiratory: Negative.    Gastrointestinal: Negative.   Genitourinary: Negative.   Musculoskeletal: Negative.   Skin:  Rash: LLL.  Neurological: Negative.    Endo/Heme/Allergies: Negative.   Psychiatric/Behavioral: Negative.    All other systems reviewed and are negative.    Studies Reviewed: .        Cardiac Studies & Procedures     STRESS TESTS  MYOCARDIAL PERFUSION IMAGING 05/16/2022  Narrative   Findings are consistent with no  ischemia and no prior myocardial infarction. The study is intermediate risk secondary to diminished EF which could be related to paced rhythm.   No ST deviation was noted.   Left ventricular function is abnormal. Global function is mildly reduced. Nuclear stress EF: 44 %. The left ventricular ejection fraction is moderately decreased (30-44%). End diastolic cavity size is normal.   Prior study not available for comparison.   ECHOCARDIOGRAM  ECHOCARDIOGRAM COMPLETE 03/01/2018  Narrative *[facility]* [address] [place], [place] [address] [phone]  ------------------------------------------------------------------- Transthoracic Echocardiography  Patient:    [place], [place] MR #:       [id number] Study Date: 03/01/2018 Gender:     M Age:        87 Height:     190.5 cm Weight:     125.2 kg BSA:        2.61 m^2 Pt. Status: Room:  SONOGRAPHER  Paul Kent, RDCS ATTENDING    Paul Kent, M.D. Paul Kent, Paul [id number] REFERRING    Kent, Paul [phone] PERFORMING   [facility], Outpatient  cc:  ------------------------------------------------------------------- LV EF: 45% -   50%  ------------------------------------------------------------------- Indications:      (I48.91).  (I50.9).  -------------------------------------------------------------------  History:   PMH:  DEFINITY. Acquired from the patient and from the patient&'s chart.  Dyspnea and bilateral lower extremity edema. Atrial fibrillation.  ------------------------------------------------------------------- Study Conclusions  - Left ventricle: The cavity size was normal. Wall thickness was increased in a  pattern of mild LVH. Systolic function was mildly reduced. The estimated ejection fraction was in the range of 45% to 50%. Diffuse hypokinesis. - Left atrium: The atrium was mildly dilated. Volume/bsa, S: 37.6 ml/m^2. - Right ventricle: The cavity size was moderately dilated. Wall thickness was normal. Pacer wire or catheter noted in right ventricle.  ------------------------------------------------------------------- Labs, prior tests, procedures, and surgery: Permanent pacemaker system implantation.  ------------------------------------------------------------------- Study data:  No prior study was available for comparison.  Study status:  Routine.  Procedure:  The patient reported no pain pre or post test. Transthoracic echocardiography for diagnosis, for left ventricular function evaluation, for right ventricular function evaluation, and for assessment of valvular function. Image quality was adequate. The study was technically difficult, as a result of poor sound wave transmission. Intravenous contrast (Definity) was administered to opacify the LV.  Study completion:  There were no complications.          Transthoracic echocardiography.  M-mode, complete 2D, spectral Doppler, and color Doppler.  Birthdate: Patient birthdate: 1941/01/18.  Age:  Patient is 82 yr old.  Sex: Gender: male.    BMI: 34.5 kg/m^2.  Blood pressure:     120/75 Patient status:  Outpatient.  Study date:  Study date: 03/01/2018. Study time: 10:36 AM.  Location:  [facility]  -------------------------------------------------------------------  ------------------------------------------------------------------- Left ventricle:  The cavity size was normal. Wall thickness was increased in a pattern of mild LVH. Systolic function was mildly reduced. The estimated ejection fraction was in the range of 45% to 50%. Diffuse  hypokinesis.  ------------------------------------------------------------------- Aortic valve:   Trileaflet; normal thickness leaflets. Mobility was not restricted.  Doppler:  Transvalvular velocity was within the normal range. There was no stenosis. There was no regurgitation.  ------------------------------------------------------------------- Aorta:  Aortic root: The aortic root was normal in size.  ------------------------------------------------------------------- Mitral valve:   Structurally normal valve.   Mobility was not restricted.  Doppler:  Transvalvular velocity was within the normal range. There was no evidence for stenosis. There was no regurgitation.  ------------------------------------------------------------------- Left atrium:  The atrium was mildly dilated.  ------------------------------------------------------------------- Right ventricle:  The cavity size was moderately dilated. Wall thickness was normal. Pacer wire or catheter noted in right ventricle. Systolic function was normal.  ------------------------------------------------------------------- Pulmonic valve:   Poorly visualized.  Structurally normal valve. Cusp separation was normal.  Doppler:  Transvalvular velocity was within the normal range. There was no evidence for stenosis. There was no regurgitation.  ------------------------------------------------------------------- Tricuspid valve:   Structurally normal valve.    Doppler: Transvalvular velocity was within the normal range. There was no regurgitation.  ------------------------------------------------------------------- Pulmonary artery:   The main pulmonary artery was normal-sized. Systolic pressure was within the normal range.  ------------------------------------------------------------------- Right atrium:  The atrium was normal in size.  ------------------------------------------------------------------- Pericardium:  There was  no pericardial effusion.  ------------------------------------------------------------------- Systemic veins: Inferior vena cava: The vessel was normal in size.  ------------------------------------------------------------------- Measurements  Left ventricle                          Value        Reference LV ID, ED, PLAX chordal         (H)     55.7  mm     43 - 52 LV ID, ES, PLAX chordal                 37    mm     23 - 38 LV fx shortening, PLAX chordal          34    %      >=29 LV PW thickness, ED                     12.9  mm     --------- IVS/LV PW ratio, ED                     0.78         <=1.3 Stroke volume, 2D                       76    ml     --------- Stroke volume/bsa, 2D                   29    ml/m^2 --------- LV e&', medial                           5.55  cm/s   ---------  Ventricular septum                      Value        Reference IVS thickness, ED                       10    mm     ---------  LVOT                                    Value        Reference LVOT ID, S                              24    mm     --------- LVOT area                               4.52  cm^2   --------- LVOT ID                                 24    mm     --------- LVOT peak velocity, S                   73.2  cm/s   --------- LVOT mean velocity, S                   51.4  cm/s   --------- LVOT VTI, S                             16.9  cm     --------- Stroke volume (SV), LVOT DP             76.5  ml     --------- Stroke index (SV/bsa), LVOT DP  29.3  ml/m^2 ---------  Aorta                                   Value        Reference Aortic root ID, ED                      36    mm     --------- Ascending aorta ID, A-P, S              40    mm     ---------  Left atrium                             Value        Reference LA ID, A-P, ES                          48    mm     --------- LA ID/bsa, A-P                          1.84  cm/m^2 <=2.2 LA volume, S                             98.2  ml     --------- LA volume/bsa, S                        37.6  ml/m^2 --------- LA volume, ES, 1-p A4C                  94.6  ml     --------- LA volume/bsa, ES, 1-p A4C              36.2  ml/m^2 --------- LA volume, ES, 1-p A2C                  97.1  ml     --------- LA volume/bsa, ES, 1-p A2C              37.2  ml/m^2 ---------  Right atrium                            Value        Reference RA ID, S-I, ES                          63.2  mm     --------- RA ID, M-L, ES, A4C                     36.4  mm     30 - 46  Right ventricle                         Value        Reference RV ID, minor axis, ED, A4C base         45.1  mm     --------- TAPSE  24.8  mm     --------- RV s&', lateral, S                       8.92  cm/s   ---------  Legend: (L)  and  (H)  mark values outside specified reference range.  ------------------------------------------------------------------- Prepared and Electronically Authenticated by  Paul Kent, M.D. 2019-09-13T13:57:06             Risk Assessment/Calculations:    CHA2DS2-VASc Score = 4   This indicates a 4.8% annual risk of stroke. The patient's score is based upon: CHF History: 1 HTN History: 0 Diabetes History: 1 Stroke History: 0 Vascular Disease History: 0 Age Score: 2 Gender Score: 0            Physical Exam:   VS:  BP 110/75 (BP Location: Left Arm, Patient Position: Sitting, Cuff Size: Normal)   Pulse (!) 110   Ht 6\' 3"  (1.905 m)   Wt 249 lb (112.9 kg)   SpO2 97%   BMI 31.12 kg/m    Wt Readings from Last 3 Encounters:  05/08/23 249 lb (112.9 kg)  03/15/23 261 lb (118.4 kg)  02/22/23 261 lb (118.4 kg)    GEN: Well nourished, well developed in no acute distress NECK: No JVD; No carotid bruits CARDIAC: RRR, no murmurs, rubs, gallops RESPIRATORY:  Clear to auscultation without rales, wheezing or rhonchi  ABDOMEN: Soft, non-tender, non-distended EXTREMITIES: Trace,  No deformity    ASSESSMENT AND PLAN: .   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  Cannot tolerate SGLT2 inhibitor secondary to recurrent UTIs, been unable to start ACE/ARB/ARNI secondary to hypotension and dizziness, not a candidate for MRA secondary to hyperkalemia and kidney dysfunction.. Continue Coreg 12.5 mg twice daily, continue torsemide 20 mg--currently taking every day, previously discussed taking it just as needed.   Atrial fibrillation/hypercoagulable state -s/p ablation x 2, s/p DCCV times 07/08/2020, today he is in sinus rhythm.  Recent device interrogation reveals he is having episodes of atrial fibs up to 11%.  Continue Xarelto 15 mg daily--dose was recently reduced by primary cardiologist, continue Coreg 12.5 mg twice daily. Followed up with atrial fibrillation clinic and was noted to be back in sinus rhythm, however recommendations that he could increase amiodarone to BID if persisted. He states he is keenly aware when he goes out of rhythm and has not had recurrence.   Sick sinus syndrome/presence of PPM-managed by EP, continues to have episodes of atrial fibrillation and recommendations to continue amiodarone.  Followed up with atrial fibrillation clinic and was noted to be back in sinus rhythm.       Dispo: BMET, CBC, follow-up in 3 months with Dr. Bing Kent.   Signed, Flossie Dibble, NP

## 2023-05-08 ENCOUNTER — Encounter: Payer: Self-pay | Admitting: Cardiology

## 2023-05-08 ENCOUNTER — Ambulatory Visit: Payer: Medicare Other | Attending: Cardiology | Admitting: Cardiology

## 2023-05-08 VITALS — BP 110/75 | HR 110 | Ht 75.0 in | Wt 249.0 lb

## 2023-05-08 DIAGNOSIS — D6869 Other thrombophilia: Secondary | ICD-10-CM | POA: Diagnosis present

## 2023-05-08 DIAGNOSIS — I4819 Other persistent atrial fibrillation: Secondary | ICD-10-CM | POA: Diagnosis present

## 2023-05-08 DIAGNOSIS — Z79899 Other long term (current) drug therapy: Secondary | ICD-10-CM

## 2023-05-08 DIAGNOSIS — I5022 Chronic systolic (congestive) heart failure: Secondary | ICD-10-CM | POA: Diagnosis present

## 2023-05-08 DIAGNOSIS — L97411 Non-pressure chronic ulcer of right heel and midfoot limited to breakdown of skin: Secondary | ICD-10-CM | POA: Diagnosis not present

## 2023-05-08 DIAGNOSIS — I480 Paroxysmal atrial fibrillation: Secondary | ICD-10-CM

## 2023-05-08 DIAGNOSIS — Z950 Presence of cardiac pacemaker: Secondary | ICD-10-CM | POA: Diagnosis present

## 2023-05-08 DIAGNOSIS — I495 Sick sinus syndrome: Secondary | ICD-10-CM | POA: Diagnosis present

## 2023-05-08 DIAGNOSIS — L89613 Pressure ulcer of right heel, stage 3: Secondary | ICD-10-CM | POA: Diagnosis not present

## 2023-05-08 DIAGNOSIS — I5020 Unspecified systolic (congestive) heart failure: Secondary | ICD-10-CM

## 2023-05-08 NOTE — Patient Instructions (Signed)
Medication Instructions:  Your physician recommends that you continue on your current medications as directed. Please refer to the Current Medication list given to you today.  *If you need a refill on your cardiac medications before your next appointment, please call your pharmacy*   Lab Work: Your physician recommends that you return for lab work in: Today for BMP and CBC  If you have labs (blood work) drawn today and your tests are completely normal, you will receive your results only by: MyChart Message (if you have MyChart) OR A paper copy in the mail If you have any lab test that is abnormal or we need to change your treatment, we will call you to review the results.   Testing/Procedures: NONE   Follow-Up: At [facility], you and your health needs are our priority.  As part of our continuing mission to provide you with exceptional heart care, we have created designated Provider Care Teams.  These Care Teams include your primary Cardiologist (physician) and Advanced Practice Providers (APPs -  Physician Assistants and Nurse Practitioners) who all work together to provide you with the care you need, when you need it.  We recommend signing up for the patient portal called "MyChart".  Sign up information is provided on this After Visit Summary.  MyChart is used to connect with patients for Virtual Visits (Telemedicine).  Patients are able to view lab/test results, encounter notes, upcoming appointments, etc.  Non-urgent messages can be sent to your provider as well.   To learn more about what you can do with MyChart, go to [email or web address].    Your next appointment:   3 month(s)  Provider:   Gypsy Balsam, MD    Other Instructions

## 2023-05-09 ENCOUNTER — Telehealth: Payer: Self-pay

## 2023-05-09 LAB — CBC WITH DIFFERENTIAL/PLATELET
Basophils Absolute: 0.1 x10E3/uL (ref 0.0–0.2)
Basos: 2 %
EOS (ABSOLUTE): 0.1 x10E3/uL (ref 0.0–0.4)
Eos: 3 %
Hematocrit: 37.8 % (ref 37.5–51.0)
Hemoglobin: 12.1 g/dL — ABNORMAL LOW (ref 13.0–17.7)
Immature Grans (Abs): 0 x10E3/uL (ref 0.0–0.1)
Immature Granulocytes: 0 %
Lymphocytes Absolute: 0.7 x10E3/uL (ref 0.7–3.1)
Lymphs: 16 %
MCH: 33.8 pg — ABNORMAL HIGH (ref 26.6–33.0)
MCHC: 32 g/dL (ref 31.5–35.7)
MCV: 106 fL — ABNORMAL HIGH (ref 79–97)
Monocytes Absolute: 0.5 x10E3/uL (ref 0.1–0.9)
Monocytes: 12 %
Neutrophils Absolute: 2.9 x10E3/uL (ref 1.4–7.0)
Neutrophils: 67 %
Platelets: 138 x10E3/uL — ABNORMAL LOW (ref 150–450)
RBC: 3.58 x10E6/uL — ABNORMAL LOW (ref 4.14–5.80)
RDW: 12.6 % (ref 11.6–15.4)
WBC: 4.3 x10E3/uL (ref 3.4–10.8)

## 2023-05-09 LAB — BASIC METABOLIC PANEL WITH GFR
BUN/Creatinine Ratio: 19 (ref 10–24)
BUN: 25 mg/dL (ref 8–27)
CO2: 28 mmol/L (ref 20–29)
Calcium: 9 mg/dL (ref 8.6–10.2)
Chloride: 100 mmol/L (ref 96–106)
Creatinine, Ser: 1.35 mg/dL — ABNORMAL HIGH (ref 0.76–1.27)
Glucose: 275 mg/dL — ABNORMAL HIGH (ref 70–99)
Potassium: 4.7 mmol/L (ref 3.5–5.2)
Sodium: 139 mmol/L (ref 134–144)
eGFR: 52 mL/min/1.73 — ABNORMAL LOW

## 2023-05-09 NOTE — Telephone Encounter (Signed)
-----   Message from Flossie Dibble sent at 05/09/2023  8:33 AM EST ----- Kidney function is slightly improved!  Stable anemia.  Platelets are slightly low, but they have been in the past as well -- continue to monitor.  Good results!

## 2023-05-09 NOTE — Telephone Encounter (Signed)
Patient notified through my chart.

## 2023-05-11 ENCOUNTER — Telehealth: Payer: Self-pay

## 2023-05-11 ENCOUNTER — Ambulatory Visit (INDEPENDENT_AMBULATORY_CARE_PROVIDER_SITE_OTHER): Payer: Medicare Other

## 2023-05-11 VITALS — BP 138/62 | HR 61 | Temp 97.7°F | Resp 16 | Ht 75.0 in | Wt 249.0 lb

## 2023-05-11 DIAGNOSIS — M65331 Trigger finger, right middle finger: Secondary | ICD-10-CM | POA: Insufficient documentation

## 2023-05-11 DIAGNOSIS — M79644 Pain in right finger(s): Secondary | ICD-10-CM | POA: Insufficient documentation

## 2023-05-11 NOTE — Patient Instructions (Signed)
Trigger finger injection given into base of right middle finger Please do trigger finger exercises Come back if no relief

## 2023-05-11 NOTE — Assessment & Plan Note (Signed)
After obtaining verbal informed consent, the RIGHT MIDDLE FINGER TRIGGER FINGER is injected at the base of the right middle finger, at the level of the pulley of the flexor sheath.  A mixture of 40 mg/1 mL kenalog and 1 mL of lidocaine without epinephrine is injected. Procedure uneventful Patient tolerated well  Post procedure instructions given

## 2023-05-11 NOTE — Telephone Encounter (Signed)
Returned patients call. Left voicemail informing patient that he should schedule an appointment for his trigger finger.

## 2023-05-11 NOTE — Telephone Encounter (Signed)
Copied from CRM [id number]. Topic: Clinical - Medication Question >> May 11, 2023  7:44 AM Paul Kent wrote: Reason for CRM: Patient is calling regarding his trigger finger - it is not bending back and there is some pain as well - patient stated that this is on-going problem and that they'd like to speak with a nurse in Dr Cox's office today if possible.

## 2023-05-11 NOTE — Progress Notes (Signed)
   Established Patient Office Visit  Subjective   Patient ID: Paul Kent, male    DOB: 06/24/1940  Age: 82 y.o. MRN: [id number]  Chief Complaint  Patient presents with   Hand Pain    HPI  The patient presents with a chief complaint of trigger finger, specifically affecting the right middle finger. He reports a sensation of locking and restriction in the finger, which has been ongoing for approximately three to four months. The patient also notes discomfort in the second, third, and fourth fingers of the right hand, but the middle finger is the most bothersome. In addition to the trigger finger, the patient experiences pain at the base of the little finger on the right hand. However, he denies any pain in the wrist or forearm. The patient has a history of a similar issue in the left hand, for which he received a steroid injection. He reports that the injection was beneficial in alleviating the symptoms.      Review of Systems  Constitutional:        Right hand and finger pain, trigger finger right middle finger      Objective:     BP 138/62 (BP Location: Left Arm, Patient Position: Standing, Cuff Size: Normal)   Pulse 61   Temp 97.7 F (36.5 C) (Temporal)   Resp 16   Ht 6\' 3"  (1.905 m)   Wt 249 lb (112.9 kg)   SpO2 98%   BMI 31.12 kg/m    Physical Exam Vitals and nursing note reviewed.  Constitutional:      Comments: Walks with a walker  HENT:     Head: Normocephalic.  Musculoskeletal:     Comments: Trigger finger noted right middle finger.   Neurological:     Mental Status: He is alert.    Hand/Upper Extremity Injection/Arthrocentesis: R long A1 for trigger finger on 05/11/2023 11:51 AM Indications: pain Details: 25 G needle, volar approach Medications: (Kenalog 40mg /mL plus 1 mL of lidocaine without epinephrine) Outcome: tolerated well, no immediate complications Procedure, treatment alternatives, risks and benefits explained, specific risks discussed.  Consent was given by the patient. Immediately prior to procedure a time out was called to verify the correct patient, procedure, equipment, support staff and site/side marked as required. Patient was prepped and draped in the usual sterile fashion.       No results found for any visits on 05/11/23.    The ASCVD Risk score (Arnett DK, et al., 2019) failed to calculate for the following reasons:   The 2019 ASCVD risk score is only valid for ages 58 to 60   The patient has a prior MI or stroke diagnosis    Assessment & Plan:   Problem List Items Addressed This Visit     Trigger finger, right middle finger - Primary    After obtaining verbal informed consent, the RIGHT MIDDLE FINGER TRIGGER FINGER is injected at the base of the right middle finger, at the level of the pulley of the flexor sheath.  A mixture of 40 mg/1 mL kenalog and 1 mL of lidocaine without epinephrine is injected. Procedure uneventful Patient tolerated well  Post procedure instructions given       Return if symptoms worsen or fail to improve.    Windell Moment, MD

## 2023-05-15 DIAGNOSIS — L97411 Non-pressure chronic ulcer of right heel and midfoot limited to breakdown of skin: Secondary | ICD-10-CM | POA: Diagnosis not present

## 2023-05-15 DIAGNOSIS — L89613 Pressure ulcer of right heel, stage 3: Secondary | ICD-10-CM | POA: Diagnosis not present

## 2023-05-22 ENCOUNTER — Other Ambulatory Visit: Payer: Self-pay | Admitting: Physician Assistant

## 2023-05-22 ENCOUNTER — Other Ambulatory Visit: Payer: Self-pay

## 2023-05-22 DIAGNOSIS — R3589 Other polyuria: Secondary | ICD-10-CM

## 2023-05-22 DIAGNOSIS — I130 Hypertensive heart and chronic kidney disease with heart failure and stage 1 through stage 4 chronic kidney disease, or unspecified chronic kidney disease: Secondary | ICD-10-CM

## 2023-05-28 DIAGNOSIS — G245 Blepharospasm: Secondary | ICD-10-CM | POA: Diagnosis not present

## 2023-05-29 DIAGNOSIS — E11621 Type 2 diabetes mellitus with foot ulcer: Secondary | ICD-10-CM | POA: Diagnosis not present

## 2023-05-29 DIAGNOSIS — L601 Onycholysis: Secondary | ICD-10-CM | POA: Diagnosis not present

## 2023-05-29 DIAGNOSIS — L97411 Non-pressure chronic ulcer of right heel and midfoot limited to breakdown of skin: Secondary | ICD-10-CM | POA: Diagnosis not present

## 2023-05-29 DIAGNOSIS — L89603 Pressure ulcer of unspecified heel, stage 3: Secondary | ICD-10-CM | POA: Diagnosis not present

## 2023-05-29 DIAGNOSIS — L97519 Non-pressure chronic ulcer of other part of right foot with unspecified severity: Secondary | ICD-10-CM | POA: Diagnosis not present

## 2023-05-29 DIAGNOSIS — L03031 Cellulitis of right toe: Secondary | ICD-10-CM | POA: Diagnosis not present

## 2023-05-29 NOTE — Progress Notes (Unsigned)
Subjective:  Patient ID: Paul Kent, male    DOB: [birth year]  Age: 82 y.o. MRN: [id number]  Chief Complaint  Patient presents with   Medical Management of Chronic Issues    HPI History of Present Illness The patient, with a history of diabetes, presents with concerns about high blood sugar levels, eye twitching, and sores on his feet. The patient reports that his blood sugar levels have been ranging from 150-300, which is higher than his usual range of 89 to 120. The patient has been trying to lower his blood sugar levels and reports a recent reading of 113. The patient is currently taking Toujeo at night, Mounjaro once a week, glipizide twice a day, and [place] daily. The patient has recently started taking Toujeo at night in an attempt to control his morning blood sugar levels, which are typically the highest. Diabetic Complications: neuropathy Glucose checking:once daily in am.  Glucose logs: 150-300 Hypoglycemia: in the middle of the night sugar dropped to 74.   Most recent A1C: 6.9% Current medications: Glipizide 10 mg twice a day, farxiga 10 mg daily, and insulin glargine 20 U before bed, mounjaro 5 mg once weekly. Marland Kitchen .Stopped ozempic due to gi upset.  Last Eye Exam: 01/16/2022. Foot checks: wound care visits weekly and dressing changes to right heel every other day.    The patient also reports experiencing eye twitching, for which he has received Botox injections. The patient has only had one series of injections so far and is waiting to see if the treatment is effective.  The patient has sores on both feet, which are being treated at a wound care center. The patient reports that the sores are nearly healed, but there is concern about a potential new sore developing on the ball of the right foot.  The patient also uses oxygen therapy at night and a CPAP machine. The patient reports that the CPAP machine may be contributing to his eye twitching as it blows air into his eyes. The  patient has tried different masks to alleviate this issue.   Hyperlipidemia: Current medications: Atorvastatin 10 mg daily.   Hypertension/CHF: Current medications: Carvedilol 6.25 mg twice a day dropped bp too low, so stopped medication. Torsemide 20 mg daily as needed.    GERD: Famotidine 40 mg daily.   Afib: Taking Amiodarone 200 mg everyday and xarelto 15 mg every evening.   Hypothyroidism: He takes Levothyroxine 150 mcg every morning.   Diet: eating healthy. Patient is concerned about his weight.  Exercise: his right foot ulcer has prevented him from going to the gym.     03/15/2023    9:56 AM 02/06/2023   10:53 AM 10/26/2022    2:35 PM 02/13/2022    9:10 AM 11/24/2021    9:00 AM  Depression screen PHQ 2/9  Decreased Interest 0 0 0 0 0  Down, Depressed, Hopeless 0 0 0 0 0  PHQ - 2 Score 0 0 0 0 0  Altered sleeping 0  0    Tired, decreased energy 0  0    Change in appetite 0  0    Feeling bad or failure about yourself  0  0    Trouble concentrating 0  0    Moving slowly or fidgety/restless 0  0    Suicidal thoughts 0  0    PHQ-9 Score 0  0    Difficult doing work/chores Not difficult at all  Not difficult at all  03/15/2023    9:55 AM  Fall Risk   Falls in the past year? 1  Number falls in past yr: 1  Injury with Fall? 1  Risk for fall due to : History of fall(s)  Follow up Falls evaluation completed;Falls prevention discussed    Patient Care Team: Blane Ohara, MD as PCP - General (Internal Medicine) Regan Lemming, MD as PCP - Electrophysiology (Cardiology) Georgeanna Lea, MD as PCP - Cardiology (Cardiology) Debroah Baller, MD as Consulting Physician (Urology) Ulice Brilliant, MD (Orthopedic Surgery) Cherly Beach, MD as Referring Physician (Family Medicine) Evelena Leyden, OD (Optometry) Zettie Pho, [facility] (Inactive) as Pharmacist (Pharmacist)   Review of Systems  Constitutional:  Negative for chills, diaphoresis, fatigue and fever.   HENT:  Negative for congestion, ear pain and sore throat.   Respiratory:  Negative for cough and shortness of breath.   Cardiovascular:  Negative for chest pain and leg swelling.  Gastrointestinal:  Negative for abdominal pain, constipation, diarrhea, nausea and vomiting.  Genitourinary:  Negative for dysuria and urgency.  Musculoskeletal:  Negative for arthralgias and myalgias.  Neurological:  Negative for dizziness and headaches.  Psychiatric/Behavioral:  Negative for dysphoric mood.     Current Outpatient Medications on File Prior to Visit  Medication Sig Dispense Refill   amiodarone (PACERONE) 200 MG tablet Take 1 tablet by mouth twice a day for 14 days then reduce back to once a day 120 tablet 1   atorvastatin (LIPITOR) 10 MG tablet TAKE ONE TABLET BY MOUTH EVERY EVENING 90 tablet 0   carboxymethylcellulose (REFRESH PLUS) 0.5 % SOLN Place 1 drop into both eyes daily as needed (dry eyes).     famotidine (PEPCID) 40 MG tablet TAKE ONE TABLET BY MOUTH DAILY 90 tablet 0   gabapentin (NEURONTIN) 300 MG capsule TAKE ONE CAPSULE BY MOUTH EVERY DAY and TAKE ONE CAPSULE BY MOUTH AT BEDTIME (Patient taking differently: Take 300 mg by mouth at bedtime.) 180 capsule 1   glipiZIDE (GLUCOTROL XL) 10 MG 24 hr tablet TAKE ONE TABLET BY MOUTH TWICE DAILY 180 tablet 1   levothyroxine (SYNTHROID) 150 MCG tablet Take 1 tablet (150 mcg total) by mouth daily. 90 tablet 1   primidone (MYSOLINE) 50 MG tablet TAKE ONE TABLET BY MOUTH EVERY DAY and TAKE ONE TABLET BY MOUTH AT BEDTIME 180 tablet 0   Rivaroxaban (XARELTO) 15 MG TABS tablet Take 1 tablet (15 mg total) by mouth daily with supper. 30 tablet 5   tamsulosin (FLOMAX) 0.4 MG CAPS capsule TAKE ONE CAPSULE BY MOUTH EVERY EVENING 30 capsule 1   testosterone cypionate (DEPOTESTOSTERONE CYPIONATE) 200 MG/ML injection INJECT 200MG  (1 MILLILITER) into the muscle EVERY 14 DAYS (Patient taking differently: Inject 200 mg into the muscle every 14 (fourteen) days.)  10 mL 0   torsemide (DEMADEX) 20 MG tablet Take 20 mg by mouth daily as needed (For weight gain of 3 pds in 1 day).     TOUJEO SOLOSTAR 300 UNIT/ML Solostar Pen Inject 20 Units into the skin daily at 6 (six) AM. 4.5 mL 0   No current facility-administered medications on file prior to visit.   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) 04/26/2021   Corns and callosities    Deviated septum 07/01/2018   Deviated septum 07/01/2018   Diabetes mellitus due to underlying condition with unspecified complications (HCC) 06/08/2016   Diabetic ulcer of right heel associated with diabetes mellitus due to underlying condition, limited to breakdown of skin (HCC) 07/31/2021   Disturbances of salivary secretion    Epistaxis    Essential hypertension    Essential tremor    H pylori ulcer    H pylori ulcer    Herpes zoster with nervous system complication 06/03/2013   Hesitancy of micturition    Hyperlipidemia    Hypothyroidism    Impacted cerumen, bilateral    Jaw pain    Lesion of femoral nerve 04/30/2013   Localized edema    Male erectile disorder    Malignant 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)    Nasal congestion 07/01/2018   Nasal turbinate hypertrophy 07/01/2018   Obstructive sleep apnea 01/17/2017   Other constipation    Other fatigue    Overweight    Pacemaker reprogramming/check 02/12/2017   Pain in right finger(s)    Pain in right lower leg    Paroxysmal atrial fibrillation (HCC) 06/08/2016   Peptic ulcer disease    Persistent atrial fibrillation (HCC) 06/01/2017    Primary insomnia    Renal stones    Renal stones    Secondary hypercoagulable state (HCC) 06/17/2020   Shingles 2014   WITH COMPLICATIONS (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)   Shingles 06/2012   Shortness of breath    Sick sinus syndrome (HCC)    Sinus bradycardia 03/07/2017   Sleep apnea    dx 08/2015, CPAP   Spontaneous ecchymoses    Status post ablation of atrial fibrillation 09/16/2020   Testicular hypofunction    Thoracic or lumbosacral neuritis or radiculitis 06/03/2013   Trigger middle finger of right hand 11/28/2019   Trigger middle finger of right hand 11/28/2019   Type 2 diabetes mellitus with circulatory disorder, 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   Past Surgical History:  Procedure Laterality Date   ABLATION  06/2017   ATRIAL FIBRILLATION ABLATION N/A 05/21/2020   Procedure: ATRIAL FIBRILLATION ABLATION;  Surgeon: Regan Lemming, MD;  Location: [facility] INVASIVE CV LAB;  Service: Cardiovascular;  Laterality: N/A;   CARDIOVERSION N/A 03/22/2018   Procedure: CARDIOVERSION;  Surgeon: Jodelle Red, MD;  Location: [facility] ENDOSCOPY;  Service: Cardiovascular;  Laterality: N/A;   CARDIOVERSION N/A 07/08/2020   Procedure: CARDIOVERSION;  Surgeon: Jake Bathe, MD;  Location: [facility] ENDOSCOPY;  Service: Cardiovascular;  Laterality: N/A;   HEMIARTHROPLASTY HIP  10/31/2013   IT HIP BIPOLAR   LUMBAR SPINE SURGERY     PACEMAKER PLACEMENT  01/29/2017   SHOULDER SURGERY Right     Family History  Problem Relation Age of Onset   Heart attack Mother    Transient ischemic attack Mother    Heart Problems Father    Tuberculosis Brother    Diabetes type II Other    Hyperlipidemia Other    Congestive Heart Failure Other    Arthritis Other    Social History   Socioeconomic History   Marital status: Married    Spouse name: Kennon Rounds   Number of children: 3   Years of education: Not on file  Highest education level: Not on file   Occupational History   Occupation: RETIRED    Comment: school principal  Tobacco Use   Smoking status: Never   Smokeless tobacco: Never  Vaping Use   Vaping status: Never Used  Substance and Sexual Activity   Alcohol use: Not Currently   Drug use: Never   Sexual activity: Not on file  Other Topics Concern   Not on file  Social History Narrative   Lives with wife   Social Drivers of Health   Financial Resource Strain: Low Risk  (05/30/2023)   Overall Financial Resource Strain (CARDIA)    Difficulty of Paying Living Expenses: Not hard at all  Food Insecurity: No Food Insecurity (05/30/2023)   Hunger Vital Sign    Worried About Running Out of Food in the Last Year: Never true    Ran Out of Food in the Last Year: Never true  Transportation Needs: No Transportation Needs (05/30/2023)   PRAPARE - Administrator, Civil Service (Medical): No    Lack of Transportation (Non-Medical): No  Physical Activity: Sufficiently Active (05/30/2023)   Exercise Vital Sign    Days of Exercise per Week: 4 days    Minutes of Exercise per Session: 60 min  Stress: No Stress Concern Present (05/30/2023)   [employer] of Occupational Health - Occupational Stress Questionnaire    Feeling of Stress : Not at all  Social Connections: Socially Integrated (05/30/2023)   Social Connection and Isolation Panel [NHANES]    Frequency of Communication with Friends and Family: More than three times a week    Frequency of Social Gatherings with Friends and Family: More than three times a week    Attends Religious Services: More than 4 times per year    Active Member of [employer] or Organizations: Yes    Attends Engineer, structural: More than 4 times per year    Marital Status: Married    Objective:  BP 132/60   Pulse 74   Temp 97.6 F (36.4 C)   Ht 6\' 3"  (1.905 m)   Wt 247 lb (112 kg)   SpO2 98%   BMI 30.87 kg/m      05/30/2023   10:57 AM 05/11/2023   10:43 AM 05/08/2023    10:25 AM  BP/Weight  Systolic BP 132 138 110  Diastolic BP 60 62 75  Wt. (Lbs) 247 249 249  BMI 30.87 kg/m2 31.12 kg/m2 31.12 kg/m2    Physical Exam  Diabetic Foot Exam - Simple   No data filed      Lab Results  Component Value Date   WBC 4.7 05/30/2023   HGB 11.6 (L) 05/30/2023   HCT 36.0 (L) 05/30/2023   PLT 154 05/30/2023   GLUCOSE 220 (H) 05/30/2023   CHOL 138 05/30/2023   TRIG 76 05/30/2023   HDL 50 05/30/2023   LDLCALC 73 05/30/2023   ALT 14 05/30/2023   AST 16 05/30/2023   NA 140 05/30/2023   K 5.0 05/30/2023   CL 102 05/30/2023   CREATININE 1.61 (H) 05/30/2023   BUN 30 (H) 05/30/2023   CO2 21 05/30/2023   TSH 4.010 05/30/2023   HGBA1C 8.9 (H) 05/30/2023   MICROALBUR 80 01/20/2021      Assessment & Plan:    Diabetic polyneuropathy associated with type 2 diabetes mellitus (HCC) Assessment & Plan: Control: not at goal. Order dexcom G7. Recommend check feet daily. Recommend annual eye exams. Medicines: Glipizide 10 mg twice a  day, farxiga 10 mg daily, and insulin glargine 20 U before bed, Increase mounjaro to 7.5 mg once weekly. If hypoglycemia occurs, recommend hold glipizde.  Continue to work on eating a healthy diet and exercise.  Labs drawn today.      Encounter for immunization Best boy Vaccine 28yrs & older  Hypertensive heart and renal disease, stage 1-4 or unspecified chronic kidney disease, with heart failure (HCC) Assessment & Plan: Well controlled.  Continue torsemide 20 mg daily as needed .   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 We will also order a Dexcom continuous glucose monitor for better tracking.  Orders: -     Hemoglobin A1c -     Microalbumin / creatinine urine  ratio  Gastroesophageal reflux disease without esophagitis Assessment & Plan: The current medical regimen is effective;  continue present plan and medications. Continue pepcid 40 mg daily.    Mixed hyperlipidemia Assessment & Plan: Well controlled.  No changes to medicines. Continue Atorvastatin 10 mg daily. Continue to work on eating a healthy diet and exercise.  Labs drawn today.    Orders: -     Lipid panel  Stage 3b chronic kidney disease (HCC) Assessment & Plan: Stable.   Chronic respiratory failure with hypoxia (HCC) Assessment & Plan: Continue with oxygen 2 L at night.    OSA on CPAP Assessment & Plan: Conitnue wih CPAP with oxygen 2 L at night.    Acquired hypothyroidism Assessment & Plan: Previously well controlled Continue Synthroid at current dose of 150 mcg once daily in am. Recheck TSH and adjust Synthroid as indicated    Orders: -     T4, free -     TSH  Blepharospasm Assessment & Plan: You recently received Botox treatment and we will continue to monitor its effectiveness.   Other orders -     Tirzepatide; Inject 7.5 mg into the skin once a week.  Dispense: 2 mL; Refill: 0    Meds ordered this encounter  Medications   tirzepatide (MOUNJARO) 7.5 MG/0.5ML Pen    Sig: Inject 7.5 mg into the skin once a week.    Dispense:  2 mL    Refill:  0    Orders Placed This Encounter  Procedures   Pfizer Comirnaty Covid-19 Vaccine 13yrs & older   CBC with Differential/Platelet   Comprehensive metabolic panel   Hemoglobin A1c   Lipid panel   T4, free   TSH   Microalbumin / creatinine urine ratio     Follow-up: Return in about 3 months (around 08/28/2023) for chronic follow up.   I,Marla I Leal-Borjas,acting as a scribe for Blane Ohara, MD.,have documented all relevant documentation on the behalf of Blane Ohara, MD,as directed by  Blane Ohara, MD while in the presence of Blane Ohara, MD.   An After Visit Summary was printed and given to the  patient.  I attest that I have reviewed this visit and agree with the plan scribed by my staff.   Blane Ohara, MD [facility] [phone]

## 2023-05-30 ENCOUNTER — Ambulatory Visit (INDEPENDENT_AMBULATORY_CARE_PROVIDER_SITE_OTHER): Payer: Medicare Other | Admitting: Family Medicine

## 2023-05-30 ENCOUNTER — Encounter: Payer: Self-pay | Admitting: Family Medicine

## 2023-05-30 VITALS — BP 132/60 | HR 74 | Temp 97.6°F | Ht 75.0 in | Wt 247.0 lb

## 2023-05-30 DIAGNOSIS — N1832 Chronic kidney disease, stage 3b: Secondary | ICD-10-CM | POA: Diagnosis not present

## 2023-05-30 DIAGNOSIS — I509 Heart failure, unspecified: Secondary | ICD-10-CM

## 2023-05-30 DIAGNOSIS — E1142 Type 2 diabetes mellitus with diabetic polyneuropathy: Secondary | ICD-10-CM | POA: Diagnosis not present

## 2023-05-30 DIAGNOSIS — E1121 Type 2 diabetes mellitus with diabetic nephropathy: Secondary | ICD-10-CM | POA: Diagnosis not present

## 2023-05-30 DIAGNOSIS — E782 Mixed hyperlipidemia: Secondary | ICD-10-CM

## 2023-05-30 DIAGNOSIS — Z23 Encounter for immunization: Secondary | ICD-10-CM | POA: Diagnosis not present

## 2023-05-30 DIAGNOSIS — I130 Hypertensive heart and chronic kidney disease with heart failure and stage 1 through stage 4 chronic kidney disease, or unspecified chronic kidney disease: Secondary | ICD-10-CM | POA: Diagnosis not present

## 2023-05-30 DIAGNOSIS — E039 Hypothyroidism, unspecified: Secondary | ICD-10-CM

## 2023-05-30 DIAGNOSIS — K219 Gastro-esophageal reflux disease without esophagitis: Secondary | ICD-10-CM | POA: Diagnosis not present

## 2023-05-30 DIAGNOSIS — G4733 Obstructive sleep apnea (adult) (pediatric): Secondary | ICD-10-CM | POA: Diagnosis not present

## 2023-05-30 DIAGNOSIS — I4891 Unspecified atrial fibrillation: Secondary | ICD-10-CM | POA: Diagnosis not present

## 2023-05-30 DIAGNOSIS — J9611 Chronic respiratory failure with hypoxia: Secondary | ICD-10-CM

## 2023-05-30 DIAGNOSIS — G245 Blepharospasm: Secondary | ICD-10-CM

## 2023-05-30 MED ORDER — TIRZEPATIDE 7.5 MG/0.5ML ~~LOC~~ SOAJ: 7.5000 mg | SUBCUTANEOUS | 0 refills | Status: DC

## 2023-05-30 NOTE — Patient Instructions (Signed)
VISIT SUMMARY:  During today's visit, we discussed your concerns about high blood sugar levels, eye twitching, and sores on your feet. We reviewed your current medications and made some adjustments to better manage your diabetes. We also talked about your ongoing treatments for eye twitching, foot sores, and sleep apnea. Additionally, we addressed your hearing concerns and general health maintenance.  YOUR PLAN:  -UNCONTROLLED DIABETES MELLITUS: Uncontrolled diabetes means that your blood sugar levels are higher than the target range. We have increased your Mounjaro dose to 7.5mg  weekly and will monitor your blood sugar levels more frequently to avoid low blood sugar. We will also order a Dexcom continuous glucose monitor for better tracking.  --BLEPHAROSPASM: Blepharospasm is the involuntary twitching of the eyelid. You recently received Botox treatment and we will continue to monitor its effectiveness.  -OBSTRUCTIVE SLEEP APNEA: Obstructive sleep apnea is a condition where your breathing stops and starts during sleep. Continue using your CPAP machine and try adjusting the mask for better comfort.  -GENERAL HEALTH MAINTENANCE: For your overall health, continue using oxygen therapy at night, ensure your vaccinations are up-to-date, and we will check your cholesterol levels. Follow up after your blood work and urine test results.  INSTRUCTIONS: Please follow up after your blood work and urine test results.   Increase mounjaro 7.5 mg weekly. Monitor for hypoglycemia and hold glipizide if sugars drop.  I will order dexcom G7 through [facility].

## 2023-05-31 LAB — CBC WITH DIFFERENTIAL/PLATELET
Basophils Absolute: 0.1 10*3/uL (ref 0.0–0.2)
Basos: 2 %
EOS (ABSOLUTE): 0.2 10*3/uL (ref 0.0–0.4)
Eos: 4 %
Hematocrit: 36 % — ABNORMAL LOW (ref 37.5–51.0)
Hemoglobin: 11.6 g/dL — ABNORMAL LOW (ref 13.0–17.7)
Immature Grans (Abs): 0 10*3/uL (ref 0.0–0.1)
Immature Granulocytes: 0 %
Lymphocytes Absolute: 0.8 10*3/uL (ref 0.7–3.1)
Lymphs: 17 %
MCH: 32.7 pg (ref 26.6–33.0)
MCHC: 32.2 g/dL (ref 31.5–35.7)
MCV: 101 fL — ABNORMAL HIGH (ref 79–97)
Monocytes Absolute: 0.6 10*3/uL (ref 0.1–0.9)
Monocytes: 13 %
Neutrophils Absolute: 3 10*3/uL (ref 1.4–7.0)
Neutrophils: 64 %
Platelets: 154 10*3/uL (ref 150–450)
RBC: 3.55 x10E6/uL — ABNORMAL LOW (ref 4.14–5.80)
RDW: 12.4 % (ref 11.6–15.4)
WBC: 4.7 10*3/uL (ref 3.4–10.8)

## 2023-05-31 LAB — COMPREHENSIVE METABOLIC PANEL
ALT: 14 [IU]/L (ref 0–44)
AST: 16 [IU]/L (ref 0–40)
Albumin: 4 g/dL (ref 3.7–4.7)
Alkaline Phosphatase: 184 [IU]/L — ABNORMAL HIGH (ref 44–121)
BUN/Creatinine Ratio: 19 (ref 10–24)
BUN: 30 mg/dL — ABNORMAL HIGH (ref 8–27)
Bilirubin Total: 0.4 mg/dL (ref 0.0–1.2)
CO2: 21 mmol/L (ref 20–29)
Calcium: 9 mg/dL (ref 8.6–10.2)
Chloride: 102 mmol/L (ref 96–106)
Creatinine, Ser: 1.61 mg/dL — ABNORMAL HIGH (ref 0.76–1.27)
Globulin, Total: 2.8 g/dL (ref 1.5–4.5)
Glucose: 220 mg/dL — ABNORMAL HIGH (ref 70–99)
Potassium: 5 mmol/L (ref 3.5–5.2)
Sodium: 140 mmol/L (ref 134–144)
Total Protein: 6.8 g/dL (ref 6.0–8.5)
eGFR: 42 mL/min/{1.73_m2} — ABNORMAL LOW (ref >59)

## 2023-05-31 LAB — MICROALBUMIN / CREATININE URINE RATIO
Creatinine, Urine: 64.4 mg/dL
Microalb/Creat Ratio: 16 mg/g{creat} (ref 0–29)
Microalbumin, Urine: 10.3 ug/mL

## 2023-05-31 LAB — LIPID PANEL
Chol/HDL Ratio: 2.8 {ratio} (ref 0.0–5.0)
Cholesterol, Total: 138 mg/dL (ref 100–199)
HDL: 50 mg/dL (ref >39)
LDL Chol Calc (NIH): 73 mg/dL (ref 0–99)
Triglycerides: 76 mg/dL (ref 0–149)
VLDL Cholesterol Cal: 15 mg/dL (ref 5–40)

## 2023-05-31 LAB — TSH: TSH: 4.01 u[IU]/mL (ref 0.450–4.500)

## 2023-05-31 LAB — HEMOGLOBIN A1C
Est. average glucose Bld gHb Est-mCnc: 209 mg/dL
Hgb A1c MFr Bld: 8.9 % — ABNORMAL HIGH (ref 4.8–5.6)

## 2023-05-31 LAB — T4, FREE: Free T4: 1.62 ng/dL (ref 0.82–1.77)

## 2023-06-02 DIAGNOSIS — G245 Blepharospasm: Secondary | ICD-10-CM | POA: Insufficient documentation

## 2023-06-02 NOTE — Assessment & Plan Note (Signed)
We have increased your Mounjaro dose to 7.5mg  weekly and will monitor your blood sugar levels more frequently to avoid low blood sugar. We will also order a Dexcom continuous glucose monitor for better tracking.

## 2023-06-02 NOTE — Assessment & Plan Note (Signed)
Well controlled.  ?No changes to medicines. Continue Atorvastatin 10 mg daily. ?Continue to work on eating a healthy diet and exercise.  ?Labs drawn today.  ? ?

## 2023-06-02 NOTE — Assessment & Plan Note (Signed)
Well controlled.  Continue torsemide 20 mg daily as needed .

## 2023-06-02 NOTE — Assessment & Plan Note (Signed)
The current medical regimen is effective;  continue present plan and medications. 

## 2023-06-02 NOTE — Assessment & Plan Note (Signed)
Stable

## 2023-06-02 NOTE — Assessment & Plan Note (Signed)
Previously well controlled Continue Synthroid at current dose  Recheck TSH and adjust Synthroid as indicated   

## 2023-06-02 NOTE — Assessment & Plan Note (Signed)
You recently received Botox treatment and we will continue to monitor its effectiveness.

## 2023-06-02 NOTE — Assessment & Plan Note (Signed)
Control: not at goal. Order dexcom G7. Recommend check feet daily. Recommend annual eye exams. Medicines: Glipizide 10 mg twice a day, farxiga 10 mg daily, and insulin glargine 20 U before bed, Increase mounjaro to 7.5 mg once weekly. If hypoglycemia occurs, recommend hold glipizde.  Continue to work on eating a healthy diet and exercise.  Labs drawn today.

## 2023-06-02 NOTE — Assessment & Plan Note (Signed)
Conitnue wih CPAP with oxygen 2 L at night.

## 2023-06-02 NOTE — Assessment & Plan Note (Signed)
Continue with oxygen 2 L at night.

## 2023-06-04 ENCOUNTER — Other Ambulatory Visit: Payer: Self-pay

## 2023-06-04 ENCOUNTER — Other Ambulatory Visit: Payer: Self-pay | Admitting: Family Medicine

## 2023-06-05 ENCOUNTER — Telehealth: Payer: Self-pay

## 2023-06-05 ENCOUNTER — Ambulatory Visit: Payer: Medicare Other

## 2023-06-05 NOTE — Telephone Encounter (Signed)
Called patient to do Annual Wellness Visit over the phone, left voicemail for patient to call back and reschedule appointment

## 2023-06-06 ENCOUNTER — Telehealth: Payer: Self-pay

## 2023-06-06 MED ORDER — TORSEMIDE 20 MG PO TABS: 20.0000 mg | ORAL_TABLET | Freq: Every day | ORAL | 2 refills | Status: DC | PRN

## 2023-06-06 NOTE — Telephone Encounter (Signed)
Prescription sent to pharmacy.

## 2023-06-06 NOTE — Assessment & Plan Note (Addendum)
Recommend hand exercises with squeeze ball.

## 2023-06-06 NOTE — Progress Notes (Deleted)
Delete.

## 2023-06-07 ENCOUNTER — Ambulatory Visit (INDEPENDENT_AMBULATORY_CARE_PROVIDER_SITE_OTHER): Payer: Medicare Other | Admitting: Family Medicine

## 2023-06-07 ENCOUNTER — Encounter: Payer: Self-pay | Admitting: Family Medicine

## 2023-06-07 VITALS — BP 118/78 | HR 84 | Temp 97.6°F | Ht 75.0 in | Wt 246.0 lb

## 2023-06-07 DIAGNOSIS — M79644 Pain in right finger(s): Secondary | ICD-10-CM | POA: Diagnosis not present

## 2023-06-07 DIAGNOSIS — Z0000 Encounter for general adult medical examination without abnormal findings: Secondary | ICD-10-CM | POA: Diagnosis not present

## 2023-06-07 DIAGNOSIS — M65331 Trigger finger, right middle finger: Secondary | ICD-10-CM

## 2023-06-07 NOTE — Progress Notes (Signed)
Subjective:   Paul Kent is a 82 y.o. male who presents for Medicare Annual/Subsequent preventive examination.  Visit Complete: In person  Patient Medicare AWV questionnaire was completed by the patient; I have confirmed that all information answered by patient is correct and no changes since this date.   Patient is c/o that his right fifth finger not bending completely and making it difficult to open jars and has decreased grip.   Review of Systems  Constitutional:  Negative for chills, diaphoresis, fever and malaise/fatigue.  HENT:  Negative for congestion, ear pain and sore throat.   Respiratory:  Negative for cough and sputum production.   Cardiovascular:  Negative for chest pain and palpitations.  Gastrointestinal:  Negative for abdominal pain, constipation, diarrhea, nausea and vomiting.  Genitourinary:  Negative for dysuria and urgency.  Musculoskeletal:  Negative for myalgias.  Neurological:  Negative for dizziness and headaches.  Psychiatric/Behavioral:  Negative for depression. The patient is not nervous/anxious.        Objective:    Today's Vitals   06/07/23 0945 06/07/23 1048  BP: 118/78   Pulse: 84   Temp: 97.6 F (36.4 C)   SpO2: 97%   Weight: 246 lb (111.6 kg)   Height: 6\' 3"  (1.905 m)   PainSc:  0-No pain   Body mass index is 30.75 kg/m.   Physical Exam Vitals reviewed.  Constitutional:      Appearance: Normal appearance.  Musculoskeletal:     Comments: Unable to fully close grip of right hand. Non scarring such as dupetryns.  Small click of fifth finger, but does not trigger.   Neurological:     Mental Status: He is alert.      06/07/2023   10:48 AM 07/08/2020    8:09 AM 06/29/2020    2:22 PM 05/21/2020    5:56 AM 03/22/2018    9:33 AM  Advanced Directives  Does Patient Have a Medical Advance Directive? Yes Yes Yes Yes Yes  Type of Estate agent of [place];Living will Living will Healthcare Power of  [place];Living will Healthcare Power of [place];Living will Healthcare Power of [place];Living will  Does patient want to make changes to medical advance directive? No - Patient declined      Copy of Healthcare Power of Attorney in Chart? No - copy requested    No - copy requested    Current Medications (verified) Outpatient Encounter Medications as of 06/07/2023  Medication Sig   amiodarone (PACERONE) 200 MG tablet Take 1 tablet by mouth twice a day for 14 days then reduce back to once a day   atorvastatin (LIPITOR) 10 MG tablet TAKE ONE TABLET BY MOUTH EVERY EVENING   carboxymethylcellulose (REFRESH PLUS) 0.5 % SOLN Place 1 drop into both eyes daily as needed (dry eyes).   famotidine (PEPCID) 40 MG tablet TAKE ONE TABLET BY MOUTH DAILY   gabapentin (NEURONTIN) 300 MG capsule TAKE ONE CAPSULE BY MOUTH EVERY DAY and TAKE ONE CAPSULE BY MOUTH AT BEDTIME (Patient taking differently: Take 300 mg by mouth at bedtime.)   glipiZIDE (GLUCOTROL XL) 10 MG 24 hr tablet TAKE ONE TABLET BY MOUTH TWICE DAILY   levothyroxine (SYNTHROID) 137 MCG tablet TAKE ONE TABLET BY MOUTH EVERY MORNING   levothyroxine (SYNTHROID) 150 MCG tablet Take 1 tablet (150 mcg total) by mouth daily.   primidone (MYSOLINE) 50 MG tablet TAKE ONE TABLET BY MOUTH EVERY DAY and TAKE ONE TABLET BY MOUTH AT BEDTIME   Rivaroxaban (XARELTO) 15 MG TABS  tablet Take 1 tablet (15 mg total) by mouth daily with supper.   tamsulosin (FLOMAX) 0.4 MG CAPS capsule TAKE ONE CAPSULE BY MOUTH EVERY EVENING   testosterone cypionate (DEPOTESTOSTERONE CYPIONATE) 200 MG/ML injection INJECT 200MG  (1 MILLILITER) into the muscle EVERY 14 DAYS (Patient taking differently: Inject 200 mg into the muscle every 14 (fourteen) days.)   tirzepatide (MOUNJARO) 7.5 MG/0.5ML Pen Inject 7.5 mg into the skin once a week.   torsemide (DEMADEX) 20 MG tablet Take 1 tablet (20 mg total) by mouth daily as needed (For weight gain of 3 pds in 1 day).   TOUJEO SOLOSTAR 300  UNIT/ML Solostar Pen Inject 20 Units into the skin daily at 6 (six) AM.   No facility-administered encounter medications on file as of 06/07/2023.    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) 04/26/2021   Corns and callosities    Deviated septum 07/01/2018   Deviated septum 07/01/2018   Diabetes mellitus due to underlying condition with unspecified complications (HCC) 06/08/2016   Diabetic ulcer of right heel associated with diabetes mellitus due to underlying condition, limited to breakdown of skin (HCC) 07/31/2021   Disturbances of salivary secretion    Epistaxis    Essential hypertension    Essential tremor    H pylori ulcer    H pylori ulcer    Herpes zoster with nervous system complication 06/03/2013   Hesitancy of micturition    Hyperlipidemia    Hypothyroidism    Impacted cerumen, bilateral    Jaw pain    Lesion of femoral nerve 04/30/2013   Localized edema    Male erectile disorder    Malignant 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)    Nasal congestion 07/01/2018   Nasal turbinate hypertrophy 07/01/2018   Obstructive sleep apnea 01/17/2017   Other constipation    Other fatigue    Overweight    Pacemaker reprogramming/check 02/12/2017   Pain in right finger(s)    Pain in right lower leg    Paroxysmal atrial  fibrillation (HCC) 06/08/2016   Peptic ulcer disease    Persistent atrial fibrillation (HCC) 06/01/2017   Primary insomnia    Renal stones    Renal stones    Secondary hypercoagulable state (HCC) 06/17/2020   Shingles 2014   WITH COMPLICATIONS (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)   Shingles 06/2012   Shortness of breath    Sick sinus syndrome (HCC)    Sinus bradycardia 03/07/2017   Sleep apnea    dx 08/2015, CPAP   Spontaneous ecchymoses    Status post ablation of atrial fibrillation 09/16/2020   Testicular hypofunction    Thoracic or lumbosacral neuritis or radiculitis 06/03/2013   Trigger middle finger of right hand 11/28/2019   Trigger middle finger of right hand 11/28/2019   Type 2 diabetes mellitus with circulatory disorder, without  long-term current use of insulin (HCC) 01/17/2017   Past Surgical History:  Procedure Laterality Date   ABLATION  06/2017   ATRIAL FIBRILLATION ABLATION N/A 05/21/2020   Procedure: ATRIAL FIBRILLATION ABLATION;  Surgeon: Regan Lemming, MD;  Location: [facility] INVASIVE CV LAB;  Service: Cardiovascular;  Laterality: N/A;   CARDIOVERSION N/A 03/22/2018   Procedure: CARDIOVERSION;  Surgeon: Jodelle Red, MD;  Location[facility] ENDOSCOPY;  Service: Cardiovascular;  Laterality: N/A;   CARDIOVERSION N/A 07/08/2020   Procedure: CARDIOVERSION;  Surgeon: Jake Bathe, MD;  Location: [facility] ENDOSCOPY;  Service: Cardiovascular;  Laterality: N/A;   HEMIARTHROPLASTY HIP  10/31/2013   IT HIP BIPOLAR   LUMBAR SPINE SURGERY     PACEMAKER PLACEMENT  01/29/2017   SHOULDER SURGERY Right    Family History  Problem Relation Age of Onset   Heart attack Mother    Transient ischemic attack Mother    Heart Problems Father    Tuberculosis Brother    Diabetes type II Other    Hyperlipidemia Other    Congestive Heart Failure Other    Arthritis Other    Social History   Socioeconomic History   Marital status: Married    Spouse name:  Kennon Rounds   Number of children: 3   Years of education: Not on file   Highest education level: Not on file  Occupational History   Occupation: RETIRED    Comment: school principal  Tobacco Use   Smoking status: Never   Smokeless tobacco: Never  Vaping Use   Vaping status: Never Used  Substance and Sexual Activity   Alcohol use: Not Currently   Drug use: Never   Sexual activity: Not on file  Other Topics Concern   Not on file  Social History Narrative   Lives with wife   Social Drivers of Health   Financial Resource Strain: Low Risk  (05/30/2023)   Overall Financial Resource Strain (CARDIA)    Difficulty of Paying Living Expenses: Not hard at all  Food Insecurity: No Food Insecurity (05/30/2023)   Hunger Vital Sign    Worried About Running Out of Food in the Last Year: Never true    Ran Out of Food in the Last Year: Never true  Transportation Needs: No Transportation Needs (05/30/2023)   PRAPARE - Administrator, Civil Service (Medical): No    Lack of Transportation (Non-Medical): No  Physical Activity: Sufficiently Active (05/30/2023)   Exercise Vital Sign    Days of Exercise per Week: 4 days    Minutes of Exercise per Session: 60 min  Stress: No Stress Concern Present (05/30/2023)   [employer] of Occupational Health - Occupational Stress Questionnaire    Feeling of Stress : Not at all  Social Connections: Socially Integrated (05/30/2023)   Social Connection and Isolation Panel [NHANES]    Frequency of Communication with Friends and Family: More than three times a week    Frequency of Social Gatherings with Friends and Family: More than three times a week    Attends Religious Services: More than 4 times per year    Active Member of [employer] or Organizations: Yes    Attends Engineer, structural: More than 4 times per year    Marital Status: Married    Tobacco Counseling Counseling given: Not Answered   Clinical Intake:  Pre-visit preparation  completed: No  Pain : No/denies pain Pain Score: 0-No pain     Nutritional Status: BMI > 30  Obese Nutritional Risks: None Diabetes:  Yes CBG done?: No Did pt. bring in CBG monitor from home?: No  How often do you need to have someone help you when you read instructions, pamphlets, or other written materials from your doctor or pharmacy?: 1 - Never  Interpreter Needed?: No      Activities of Daily Living    06/07/2023   10:49 AM  In your present state of health, do you have any difficulty performing the following activities:  Hearing? 0  Vision? 0  Difficulty concentrating or making decisions? 0  Walking or climbing stairs? 0  Dressing or bathing? 0  Doing errands, shopping? 0  Preparing Food and eating ? N  Using the Toilet? N  In the past six months, have you accidently leaked urine? N  Do you have problems with loss of bowel control? N  Managing your Medications? N  Managing your Finances? N  Housekeeping or managing your Housekeeping? N    Patient Care Team: Blane Ohara, MD as PCP - General (Internal Medicine) Regan Lemming, MD as PCP - Electrophysiology (Cardiology) Georgeanna Lea, MD as PCP - Cardiology (Cardiology) Debroah Baller, MD as Consulting Physician (Urology) Ulice Brilliant, MD (Orthopedic Surgery) Cherly Beach, MD as Referring Physician (Family Medicine) Evelena Leyden, OD (Optometry) Zettie Pho, [facility] (Inactive) as Pharmacist (Pharmacist)  Indicate any recent Medical Services you may have received from other than [facility] providers in the past year (date may be approximate).     Assessment:   This is a routine wellness examination for Levy.  Hearing/Vision screen No results found.   Goals Addressed   None   Depression Screen    06/07/2023   10:49 AM 03/15/2023    9:56 AM 02/06/2023   10:53 AM 10/26/2022    2:35 PM 02/13/2022    9:10 AM 11/24/2021    9:00 AM 04/06/2021   10:56 AM  PHQ 2/9 Scores  PHQ - 2 Score 0 0 0 0  0 0 0  PHQ- 9 Score  0  0       Fall Risk    06/07/2023   10:49 AM 03/15/2023    9:55 AM 02/22/2023    9:03 AM 02/06/2023   10:53 AM 10/26/2022    2:35 PM  Fall Risk   Falls in the past year? 1 1 1 1  0  Number falls in past yr: 0 1 0 1 0  Injury with Fall? 1 1 1 1  0  Risk for fall due to : Impaired balance/gait;History of fall(s) History of fall(s) History of fall(s) History of fall(s) History of fall(s)  Follow up Falls evaluation completed;Falls prevention discussed Falls evaluation completed;Falls prevention discussed Falls evaluation completed;Falls prevention discussed Falls evaluation completed;Falls prevention discussed Falls evaluation completed;Falls prevention discussed    MEDICARE RISK AT HOME: Medicare Risk at Home Any stairs in or around the home?: No Home free of loose throw rugs in walkways, pet beds, electrical cords, etc?: Yes Adequate lighting in your home to reduce risk of falls?: Yes Life alert?: No Use of a cane, walker or w/c?: Yes Grab bars in the bathroom?: Yes Shower chair or bench in shower?: Yes Elevated toilet seat or a handicapped toilet?: Yes  TIMED UP AND GO:  Was the test performed?  Yes  Length of time to ambulate 10 feet: 7 sec Gait slow and steady with assistive device    Cognitive Function:        11/24/2021    9:10 AM  6CIT Screen  What  Year? 0 points  What month? 0 points  What time? 0 points  Count back from 20 0 points  Months in reverse 0 points  Repeat phrase 0 points  Total Score 0 points    Immunizations Immunization History  Administered Date(s) Administered   Fluad Quad(high Dose 65+) 03/22/2020, 04/03/2022   Fluad Trivalent(High Dose 65+) 02/22/2023   Influenza-Unspecified 02/26/2021   Moderna SARS-COV2 Booster Vaccination 10/12/2020   Moderna Sars-Covid-2 Vaccination 07/11/2019, 08/04/2019, 04/13/2020   Pfizer(Comirnaty)Fall Seasonal Vaccine 12 years and older 05/30/2023   Pneumococcal Conjugate-13 03/23/2015    Pneumococcal Polysaccharide-23 04/25/2017   Pneumococcal-Unspecified 06/19/2014, 06/19/2014   Respiratory Syncytial Virus Vaccine,Recomb Aduvanted(Arexvy) 05/17/2022   Unspecified SARS-COV-2 Vaccination 04/04/2022   Zoster Recombinant(Shingrix) 06/19/2013, 08/22/2017   Zoster, Live 06/19/2013    Screening Tests Health Maintenance  Topic Date Due   DTaP/Tdap/Td (1 - Tdap) Never done   OPHTHALMOLOGY EXAM  04/14/2023   FOOT EXAM  10/26/2023   HEMOGLOBIN A1C  11/28/2023   Diabetic kidney evaluation - eGFR measurement  05/29/2024   Diabetic kidney evaluation - Urine ACR  05/29/2024   Medicare Annual Wellness (AWV)  06/08/2024   Pneumonia Vaccine 67+ Years old  Completed   INFLUENZA VACCINE  Completed   Zoster Vaccines- Shingrix  Completed   HPV VACCINES  Aged Out   COVID-19 Vaccine  Discontinued    Health Maintenance  Health Maintenance Due  Topic Date Due   DTaP/Tdap/Td (1 - Tdap) Never done   OPHTHALMOLOGY EXAM  04/14/2023    Additional Screening:  Vision Screening: Recommended annual ophthalmology exams for early detection of glaucoma and other disorders of the eye. Is the patient up to date with their annual eye exam?  Yes  Who is the provider or what is the name of the office in which the patient attends annual eye exams?  If pt is not established with a provider, would they like to be referred to a provider to establish care? No .   Dental Screening: Recommended annual dental exams for proper oral hygiene  Diabetic Foot Exam: see note  Community Resource Referral / Chronic Care Management: CRR required this visit?  No   CCM required this visit?  No     Plan:     I have personally reviewed and noted the following in the patient's chart:   Medical and social history Use of alcohol, tobacco or illicit drugs  Current medications and supplements including opioid prescriptions. Patient is not currently taking opioid prescriptions. Functional ability and  status Nutritional status Physical activity Advanced directives List of other physicians Hospitalizations, surgeries, and ER visits in previous 12 months Vitals Screenings to include cognitive, depression, and falls Referrals and appointments  In addition, I have reviewed and discussed with patient certain preventive protocols, quality metrics, and best practice recommendations. A written personalized care plan for preventive services as well as general preventive health recommendations were provided to patient.      Clayborn Bigness I Leal-Borjas,acting as a scribe for Blane Ohara, MD.,have documented all relevant documentation on the behalf of Blane Ohara, MD,as directed by  Blane Ohara, MD while in the presence of Blane Ohara, MD.   I attest that I have reviewed this visit and agree with the plan scribed by my staff.   Blane Ohara, MD [facility] [phone]

## 2023-06-09 DIAGNOSIS — Z0000 Encounter for general adult medical examination without abnormal findings: Secondary | ICD-10-CM | POA: Insufficient documentation

## 2023-06-09 NOTE — Assessment & Plan Note (Signed)

## 2023-06-11 DIAGNOSIS — M25552 Pain in left hip: Secondary | ICD-10-CM | POA: Diagnosis not present

## 2023-06-13 ENCOUNTER — Other Ambulatory Visit: Payer: Self-pay | Admitting: Family Medicine

## 2023-06-14 DIAGNOSIS — L89603 Pressure ulcer of unspecified heel, stage 3: Secondary | ICD-10-CM | POA: Diagnosis not present

## 2023-06-14 DIAGNOSIS — L97519 Non-pressure chronic ulcer of other part of right foot with unspecified severity: Secondary | ICD-10-CM | POA: Diagnosis not present

## 2023-06-14 DIAGNOSIS — L97411 Non-pressure chronic ulcer of right heel and midfoot limited to breakdown of skin: Secondary | ICD-10-CM | POA: Diagnosis not present

## 2023-06-14 DIAGNOSIS — E11621 Type 2 diabetes mellitus with foot ulcer: Secondary | ICD-10-CM | POA: Diagnosis not present

## 2023-06-14 DIAGNOSIS — L601 Onycholysis: Secondary | ICD-10-CM | POA: Diagnosis not present

## 2023-06-14 DIAGNOSIS — L03031 Cellulitis of right toe: Secondary | ICD-10-CM | POA: Diagnosis not present

## 2023-06-15 ENCOUNTER — Ambulatory Visit: Payer: Medicare Other

## 2023-06-15 DIAGNOSIS — I495 Sick sinus syndrome: Secondary | ICD-10-CM

## 2023-06-16 LAB — CUP PACEART REMOTE DEVICE CHECK
Date Time Interrogation Session: 20241227064039
Implantable Lead Connection Status: 753985
Implantable Lead Connection Status: 753985
Implantable Lead Implant Date: 20180813
Implantable Lead Implant Date: 20180813
Implantable Lead Location: 753859
Implantable Lead Location: 753860
Implantable Lead Model: 377
Implantable Lead Model: 377
Implantable Lead Serial Number: 49893169
Implantable Lead Serial Number: 50011411
Implantable Pulse Generator Implant Date: 20180813
Pulse Gen Model: 407145
Pulse Gen Serial Number: 69158272

## 2023-06-19 ENCOUNTER — Other Ambulatory Visit: Payer: Self-pay | Admitting: Family Medicine

## 2023-06-19 DIAGNOSIS — L97411 Non-pressure chronic ulcer of right heel and midfoot limited to breakdown of skin: Secondary | ICD-10-CM | POA: Diagnosis not present

## 2023-06-19 DIAGNOSIS — L89603 Pressure ulcer of unspecified heel, stage 3: Secondary | ICD-10-CM | POA: Diagnosis not present

## 2023-06-19 DIAGNOSIS — E11621 Type 2 diabetes mellitus with foot ulcer: Secondary | ICD-10-CM | POA: Diagnosis not present

## 2023-06-19 DIAGNOSIS — L601 Onycholysis: Secondary | ICD-10-CM | POA: Diagnosis not present

## 2023-06-19 DIAGNOSIS — L03031 Cellulitis of right toe: Secondary | ICD-10-CM | POA: Diagnosis not present

## 2023-06-19 DIAGNOSIS — L97519 Non-pressure chronic ulcer of other part of right foot with unspecified severity: Secondary | ICD-10-CM | POA: Diagnosis not present

## 2023-06-25 ENCOUNTER — Ambulatory Visit: Payer: Medicare Other | Attending: Cardiology | Admitting: Cardiology

## 2023-06-25 ENCOUNTER — Encounter: Payer: Self-pay | Admitting: Cardiology

## 2023-06-25 VITALS — BP 118/58 | HR 60 | Ht 75.0 in | Wt 240.0 lb

## 2023-06-25 DIAGNOSIS — I495 Sick sinus syndrome: Secondary | ICD-10-CM | POA: Diagnosis not present

## 2023-06-25 DIAGNOSIS — G4733 Obstructive sleep apnea (adult) (pediatric): Secondary | ICD-10-CM

## 2023-06-25 DIAGNOSIS — D6869 Other thrombophilia: Secondary | ICD-10-CM

## 2023-06-25 DIAGNOSIS — I10 Essential (primary) hypertension: Secondary | ICD-10-CM | POA: Diagnosis not present

## 2023-06-25 DIAGNOSIS — I4819 Other persistent atrial fibrillation: Secondary | ICD-10-CM | POA: Diagnosis not present

## 2023-06-25 LAB — CUP PACEART INCLINIC DEVICE CHECK
Date Time Interrogation Session: 20250106121144
Implantable Lead Connection Status: 753985
Implantable Lead Connection Status: 753985
Implantable Lead Implant Date: 20180813
Implantable Lead Implant Date: 20180813
Implantable Lead Location: 753859
Implantable Lead Location: 753860
Implantable Lead Model: 377
Implantable Lead Model: 377
Implantable Lead Serial Number: 49893169
Implantable Lead Serial Number: 50011411
Implantable Pulse Generator Implant Date: 20180813
Pulse Gen Model: 407145
Pulse Gen Serial Number: 69158272

## 2023-06-25 NOTE — Progress Notes (Signed)
  Electrophysiology Office Note:   Date:  06/25/2023  ID:  [name], DOB 1940-09-02, MRN [id number]  Primary Cardiologist: Lamar Fitch, MD Primary Heart Failure: None Electrophysiologist: Shontia Gillooly Gladis Norton, MD      History of Present Illness:   Paul Kent is a 83 y.o. male with h/o atrial fibrillation, sick sinus syndrome, chronic systolic heart failure, sleep apnea, diabetes, CKD stage IIIb seen today for routine electrophysiology followup.   Since last being seen in our clinic the patient reports doing well.  He has no chest pain or shortness of breath.  He is able to do all of his daily activities without restriction.  He has noted no further episodes of atrial fibrillation.  he denies chest pain, palpitations, dyspnea, PND, orthopnea, nausea, vomiting, dizziness, syncope, edema, weight gain, or early satiety.   Review of systems complete and found to be negative unless listed in HPI.      EP Information / Studies Reviewed:    EKG is ordered today. Personal review as below.  EKG Interpretation Date/Time:  Monday June 25 2023 12:08:57 EST Ventricular Rate:  60 PR Interval:    QRS Duration:  174 QT Interval:  560 QTC Calculation: 560 R Axis:   -49  Text Interpretation: AV dual-paced rhythm When compared with ECG of 23-Jan-2023 15:12, Vent. rate has decreased BY   5 BPM Confirmed by Venezia Sargeant ([id number]) on 06/25/2023 12:22:22 PM   PPM Interrogation-  reviewed in detail today,  See PACEART report.  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 The patient's score is based upon: CHF History: 1 HTN History: 0 Diabetes History: 1 Stroke History: 0 Vascular Disease History: 0 Age Score: 2 Gender Score: 0             Physical Exam:   VS:  BP (!) 118/58   Pulse 60   Ht 6' 3 (1.905 m)   Wt 240 lb  (108.9 kg)   SpO2 96%   BMI 30.00 kg/m    Wt Readings from Last 3 Encounters:  06/25/23 240 lb (108.9 kg)  06/07/23 246 lb (111.6 kg)  05/30/23 247 lb (112 kg)     GEN: Well nourished, well developed in no acute distress NECK: No JVD; No carotid bruits CARDIAC: Regular rate and rhythm, no murmurs, rubs, gallops RESPIRATORY:  Clear to auscultation without rales, wheezing or rhonchi  ABDOMEN: Soft, non-tender, non-distended EXTREMITIES:  No edema; No deformity   ASSESSMENT AND PLAN:    SND s/p Biotronik PPM  Normal PPM function See Pace Art report No changes today  2.  Persistent atrial fibrillation: Currently on amiodarone .  Post cryoablation in 2019, RF ablation 2021.  8% burden noted on cardiac monitor.  Continue with current management.  3.  Secondary hypercoagulable state: Currently on Xarelto  for atrial fibrillation  4.  Hypertension: Currently well-controlled  5.  Obstructive sleep apnea: CPAP compliance encouraged  Disposition:   Follow up with Dr. Norton in 12 months  Signed, Rodrecus Belsky Gladis Norton, MD

## 2023-06-25 NOTE — Patient Instructions (Signed)
Medication Instructions:  Your physician recommends that you continue on your current medications as directed. Please refer to the Current Medication list given to you today.  *If you need a refill on your cardiac medications before your next appointment, please call your pharmacy*   Lab Work: None ordered   Testing/Procedures: None ordered   Follow-Up: At [facility], you and your health needs are our priority.  As part of our continuing mission to provide you with exceptional heart care, we have created designated Provider Care Teams.  These Care Teams include your primary Cardiologist (physician) and Advanced Practice Providers (APPs -  Physician Assistants and Nurse Practitioners) who all work together to provide you with the care you need, when you need it.  Your next appointment:   1 year(s)  The format for your next appointment:   In Person  Provider:   Will Camnitz, MD    Thank you for choosing [facility]!!   Amariyon Maynes, RN [phone]  Other Instructions          

## 2023-06-26 ENCOUNTER — Other Ambulatory Visit: Payer: Self-pay | Admitting: Family Medicine

## 2023-06-26 DIAGNOSIS — E1159 Type 2 diabetes mellitus with other circulatory complications: Secondary | ICD-10-CM

## 2023-06-27 ENCOUNTER — Telehealth: Payer: Self-pay

## 2023-06-27 NOTE — Telephone Encounter (Signed)
 Copied from CRM [id number]. Topic: Clinical - Medication Question >> Jun 26, 2023  2:59 PM Mosetta Putt H wrote: Reason for CRM: Need help with dexcom

## 2023-06-27 NOTE — Telephone Encounter (Signed)
 Called patient, stated he would come by the office after lunch to get help with his dexcom.

## 2023-07-02 ENCOUNTER — Other Ambulatory Visit: Payer: Self-pay | Admitting: Family Medicine

## 2023-07-02 DIAGNOSIS — E1142 Type 2 diabetes mellitus with diabetic polyneuropathy: Secondary | ICD-10-CM

## 2023-07-03 DIAGNOSIS — E11621 Type 2 diabetes mellitus with foot ulcer: Secondary | ICD-10-CM | POA: Diagnosis not present

## 2023-07-03 DIAGNOSIS — L03031 Cellulitis of right toe: Secondary | ICD-10-CM | POA: Diagnosis not present

## 2023-07-03 DIAGNOSIS — L97519 Non-pressure chronic ulcer of other part of right foot with unspecified severity: Secondary | ICD-10-CM | POA: Diagnosis not present

## 2023-07-03 DIAGNOSIS — L89613 Pressure ulcer of right heel, stage 3: Secondary | ICD-10-CM | POA: Diagnosis not present

## 2023-07-03 DIAGNOSIS — L97411 Non-pressure chronic ulcer of right heel and midfoot limited to breakdown of skin: Secondary | ICD-10-CM | POA: Diagnosis not present

## 2023-07-19 NOTE — Progress Notes (Signed)
Remote pacemaker transmission.

## 2023-07-19 NOTE — Addendum Note (Signed)
Addended by: Elease Etienne A on: 07/19/2023 04:15 PM   Modules accepted: Orders

## 2023-07-20 ENCOUNTER — Other Ambulatory Visit: Payer: Self-pay

## 2023-07-20 ENCOUNTER — Other Ambulatory Visit: Payer: Self-pay | Admitting: Family Medicine

## 2023-07-20 DIAGNOSIS — R3589 Other polyuria: Secondary | ICD-10-CM

## 2023-07-24 DIAGNOSIS — L89613 Pressure ulcer of right heel, stage 3: Secondary | ICD-10-CM | POA: Diagnosis not present

## 2023-07-24 DIAGNOSIS — L97411 Non-pressure chronic ulcer of right heel and midfoot limited to breakdown of skin: Secondary | ICD-10-CM | POA: Diagnosis not present

## 2023-08-03 ENCOUNTER — Ambulatory Visit: Payer: Medicare Other | Attending: Cardiology | Admitting: Cardiology

## 2023-08-03 ENCOUNTER — Encounter: Payer: Self-pay | Admitting: Cardiology

## 2023-08-03 VITALS — BP 112/62 | HR 59 | Ht 75.0 in | Wt 245.8 lb

## 2023-08-03 DIAGNOSIS — I480 Paroxysmal atrial fibrillation: Secondary | ICD-10-CM | POA: Insufficient documentation

## 2023-08-03 DIAGNOSIS — I5042 Chronic combined systolic (congestive) and diastolic (congestive) heart failure: Secondary | ICD-10-CM | POA: Insufficient documentation

## 2023-08-03 DIAGNOSIS — R0609 Other forms of dyspnea: Secondary | ICD-10-CM | POA: Insufficient documentation

## 2023-08-03 DIAGNOSIS — D6869 Other thrombophilia: Secondary | ICD-10-CM | POA: Insufficient documentation

## 2023-08-03 DIAGNOSIS — I495 Sick sinus syndrome: Secondary | ICD-10-CM | POA: Diagnosis not present

## 2023-08-03 DIAGNOSIS — I4819 Other persistent atrial fibrillation: Secondary | ICD-10-CM | POA: Insufficient documentation

## 2023-08-03 DIAGNOSIS — G4733 Obstructive sleep apnea (adult) (pediatric): Secondary | ICD-10-CM | POA: Insufficient documentation

## 2023-08-03 NOTE — Progress Notes (Signed)
Cardiology Office Note:    Date:  08/03/2023   ID:  Paul, Kent 03/02/1941, MRN [id number]  PCP:  Blane Ohara, MD  Cardiologist:  Gypsy Balsam, MD    Referring MD: Blane Ohara, MD   Chief Complaint  Patient presents with   Follow-up    History of Present Illness:    Paul Kent is a 83 y.o. 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 Comes today to months for follow-up overall she is doing well he described he felt that sometimes he forgets to take his diuretic and Thursdays he will have some swelling of lower extremities otherwise he seems to be doing fine described to have some episode of shortness of breath those are provoked by some exercise.  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) 04/26/2021   Corns and callosities    Deviated septum 07/01/2018   Deviated septum 07/01/2018   Diabetes mellitus due to underlying condition with unspecified complications (HCC) 06/08/2016   Diabetic ulcer of right heel associated with diabetes mellitus due to underlying condition, limited to breakdown of skin (HCC) 07/31/2021   Disturbances of salivary secretion    Epistaxis    Essential hypertension    Essential tremor    H pylori ulcer    H pylori ulcer    Herpes zoster with nervous system complication 06/03/2013   Hesitancy of  micturition    Hyperlipidemia    Hypothyroidism    Impacted cerumen, bilateral    Jaw pain    Lesion of femoral nerve 04/30/2013   Localized edema    Male erectile disorder    Malignant 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)    Nasal congestion 07/01/2018   Nasal turbinate hypertrophy 07/01/2018   Obstructive sleep apnea 01/17/2017   Other constipation    Other fatigue    Overweight    Pacemaker reprogramming/check 02/12/2017   Pain in right finger(s)    Pain in right lower leg    Paroxysmal atrial fibrillation (HCC) 06/08/2016   Peptic ulcer disease    Persistent atrial fibrillation (HCC) 06/01/2017   Primary insomnia    Renal stones    Renal stones    Secondary hypercoagulable state (HCC) 06/17/2020   Shingles 2014   WITH COMPLICATIONS (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)   Shingles 06/2012   Shortness of breath    Sick sinus syndrome (HCC)    Sinus bradycardia 03/07/2017   Sleep apnea    dx 08/2015, CPAP   Spontaneous ecchymoses    Status post ablation of atrial fibrillation 09/16/2020   Testicular hypofunction    Thoracic or lumbosacral neuritis or radiculitis 06/03/2013   Trigger middle finger of right hand 11/28/2019   Trigger middle finger of right hand 11/28/2019   Type 2 diabetes mellitus with circulatory disorder, without  long-term current use of insulin (HCC) 01/17/2017    Past Surgical History:  Procedure Laterality Date   ABLATION  06/2017   ATRIAL FIBRILLATION ABLATION N/A 05/21/2020   Procedure: ATRIAL FIBRILLATION ABLATION;  Surgeon: Regan Lemming, MD;  Location: [facility] INVASIVE CV LAB;  Service: Cardiovascular;  Laterality: N/A;   CARDIOVERSION N/A 03/22/2018   Procedure: CARDIOVERSION;  Surgeon: Jodelle Red, MD;  Location: [facility] ENDOSCOPY;  Service: Cardiovascular;  Laterality: N/A;   CARDIOVERSION N/A 07/08/2020   Procedure: CARDIOVERSION;  Surgeon: Jake Bathe, MD;  Location: [facility] ENDOSCOPY;  Service:  Cardiovascular;  Laterality: N/A;   HEMIARTHROPLASTY HIP  10/31/2013   IT HIP BIPOLAR   LUMBAR SPINE SURGERY     PACEMAKER PLACEMENT  01/29/2017   SHOULDER SURGERY Right     Current Medications: Current Meds  Medication Sig   amiodarone (PACERONE) 200 MG tablet TAKE ONE TABLET BY MOUTH EVERY DAY   atorvastatin (LIPITOR) 10 MG tablet TAKE ONE TABLET BY MOUTH EVERY EVENING   B-D UF III MINI PEN NEEDLES 31G X 5 MM MISC USE AS DIRECTED EVERY DAY to inject insulin (Patient taking differently: 1 each by Other route daily.)   carboxymethylcellulose (REFRESH PLUS) 0.5 % SOLN Place 1 drop into both eyes daily as needed (dry eyes).   famotidine (PEPCID) 40 MG tablet TAKE ONE TABLET BY MOUTH DAILY   gabapentin (NEURONTIN) 300 MG capsule Take 1 capsule (300 mg total) by mouth at bedtime.   glipiZIDE (GLUCOTROL XL) 10 MG 24 hr tablet TAKE ONE TABLET BY MOUTH TWICE DAILY   levothyroxine (SYNTHROID) 137 MCG tablet TAKE ONE TABLET BY MOUTH EVERY MORNING   MOUNJARO 7.5 MG/0.5ML Pen Inject 7.5 mg into the skin once a week. (Patient taking differently: Inject 7.5 mg into the skin once a week.)   primidone (MYSOLINE) 50 MG tablet TAKE ONE TABLET BY MOUTH EVERY DAY and TAKE ONE TABLET BY MOUTH AT BEDTIME (Patient taking differently: Take 50 mg by mouth 2 (two) times daily.)   Rivaroxaban (XARELTO) 15 MG TABS tablet Take 1 tablet (15 mg total) by mouth daily with supper.   tamsulosin (FLOMAX) 0.4 MG CAPS capsule TAKE ONE CAPSULE BY MOUTH EVERY EVENING   testosterone cypionate (DEPOTESTOSTERONE CYPIONATE) 200 MG/ML injection INJECT 200MG  (1 MILLILITER) into the muscle EVERY 14 DAYS (Patient taking differently: Inject 200 mg into the muscle every 14 (fourteen) days.)   torsemide (DEMADEX) 20 MG tablet Take 1 tablet (20 mg total) by mouth daily as needed (For weight gain of 3 pds in 1 day).   TOUJEO SOLOSTAR 300 UNIT/ML Solostar Pen Inject 20 Units into the skin daily at 6 (6) IN THE MORNING. (Patient taking  differently: Inject 20 Units into the skin in the morning.)     Allergies:   Propranolol, Canagliflozin, Clonidine derivatives, Hydralazine hcl, Metformin and related, Topiramate, Ambien [zolpidem], Farxiga [dapagliflozin], and Ozempic (0.25 or 0.5 mg-dose) [semaglutide(0.25 or 0.5mg -dos)]   Social History   Socioeconomic History   Marital status: Married    Spouse name: Kennon Rounds   Number of children: 3   Years of education: Not on file   Highest education level: Not on file  Occupational History   Occupation: RETIRED    Comment: school principal  Tobacco Use   Smoking status: Never   Smokeless tobacco: Never  Vaping Use   Vaping status: Never Used  Substance and Sexual Activity   Alcohol use: Not Currently   Drug use: Never   Sexual activity: Not on file  Other  Topics Concern   Not on file  Social History Narrative   Lives with wife   Social Drivers of Health   Financial Resource Strain: Low Risk  (05/30/2023)   Overall Financial Resource Strain (CARDIA)    Difficulty of Paying Living Expenses: Not hard at all  Food Insecurity: No Food Insecurity (05/30/2023)   Hunger Vital Sign    Worried About Running Out of Food in the Last Year: Never true    Ran Out of Food in the Last Year: Never true  Transportation Needs: No Transportation Needs (05/30/2023)   PRAPARE - Administrator, Civil Service (Medical): No    Lack of Transportation (Non-Medical): No  Physical Activity: Sufficiently Active (05/30/2023)   Exercise Vital Sign    Days of Exercise per Week: 4 days    Minutes of Exercise per Session: 60 min  Stress: No Stress Concern Present (05/30/2023)   [employer] of Occupational Health - Occupational Stress Questionnaire    Feeling of Stress : Not at all  Social Connections: Socially Integrated (05/30/2023)   Social Connection and Isolation Panel [NHANES]    Frequency of Communication with Friends and Family: More than three times a week    Frequency  of Social Gatherings with Friends and Family: More than three times a week    Attends Religious Services: More than 4 times per year    Active Member of [employer] or Organizations: Yes    Attends Engineer, structural: More than 4 times per year    Marital Status: Married     Family History: The patient's family history includes Arthritis in an other family member; Congestive Heart Failure in an other family member; Diabetes type II in an other family member; Heart Problems in his father; Heart attack in his mother; Hyperlipidemia in an other family member; Transient ischemic attack in his mother; Tuberculosis in his brother. ROS:   Please see the history of present illness.    All 14 point review of systems negative except as described per history of present illness  EKGs/Labs/Other Studies Reviewed:    EKG Interpretation Date/Time:  Friday August 03 2023 10:03:58 EST Ventricular Rate:  59 PR Interval:    QRS Duration:  168 QT Interval:  538 QTC Calculation: 532 R Axis:   117  Text Interpretation: Ventricular-paced rhythm with intrinsic complexes Abnormal ECG When compared with ECG of 25-Jun-2023 12:08, No significant change was found Confirmed by Gypsy Balsam [phone]) on 08/03/2023 10:26:44 AM    Recent Labs: 01/23/2023: NT-Pro BNP 3,185 05/30/2023: ALT 14; BUN 30; Creatinine, Ser 1.61; Hemoglobin 11.6; Platelets 154; Potassium 5.0; Sodium 140; TSH 4.010  Recent Lipid Panel    Component Value Date/Time   CHOL 138 05/30/2023 1204   TRIG 76 05/30/2023 1204   HDL 50 05/30/2023 1204   CHOLHDL 2.8 05/30/2023 1204   LDLCALC 73 05/30/2023 1204    Physical Exam:    VS:  BP 112/62 (BP Location: Right Arm, Patient Position: Sitting)   Pulse (!) 59   Ht 6\' 3"  (1.905 m)   Wt 245 lb 12.8 oz (111.5 kg)   SpO2 95%   BMI 30.72 kg/m     Wt Readings from Last 3 Encounters:  08/03/23 245 lb 12.8 oz (111.5 kg)  06/25/23 240 lb (108.9 kg)  06/07/23 246 lb (111.6 kg)      GEN:  Well nourished, well developed in no acute distress HEENT: Normal NECK: No JVD; No carotid bruits LYMPHATICS: No lymphadenopathy CARDIAC:  RRR, no murmurs, no rubs, no gallops RESPIRATORY:  Clear to auscultation without rales, wheezing or rhonchi  ABDOMEN: Soft, non-tender, non-distended MUSCULOSKELETAL:  No edema; No deformity  SKIN: Warm and dry LOWER EXTREMITIES: no swelling NEUROLOGIC:  Alert and oriented x 3 PSYCHIATRIC:  Normal affect   ASSESSMENT:    1. Persistent atrial fibrillation (HCC)   2. Dyspnea on exertion   3. Chronic combined systolic and diastolic congestive heart failure (HCC)   4. Hypercoagulable state due to persistent atrial fibrillation (HCC)   5. Paroxysmal atrial fibrillation (HCC)   6. Sick sinus syndrome (HCC)   7. OSA on CPAP    PLAN:    In order of problems listed above:  Persistent atrial fibrillation.  Continue present management.  He is asymptomatic.  Anticoagulated statin dose is appropriate to his kidney function. Dyspnea on exertion schedule him to have echocardiogram to assess left ventricle ejection fraction. Combined systolic and diastolic congestive heart failure looks compensated on physical exam does have some swelling of lower extremities right leg but he said he simply did not take his diuretic today if he takes diuretic there is no swelling. Sick sinus syndrome that being addressed with pacemaker doing well. Obstructive sleep apnea stable managed by internal medicine team   Medication Adjustments/Labs and Tests Ordered: Current medicines are reviewed at length with the patient today.  Concerns regarding medicines are outlined above.  Orders Placed This Encounter  Procedures   EKG 12-Lead   ECHOCARDIOGRAM COMPLETE   Medication changes: No orders of the defined types were placed in this encounter.   Signed, Georgeanna Lea, MD, [facility] 08/03/2023 11:15 AM    [facility]

## 2023-08-03 NOTE — Patient Instructions (Signed)
Medication Instructions:  Your physician recommends that you continue on your current medications as directed. Please refer to the Current Medication list given to you today.  *If you need a refill on your cardiac medications before your next appointment, please call your pharmacy*   Lab Work: None Ordered If you have labs (blood work) drawn today and your tests are completely normal, you will receive your results only by: MyChart Message (if you have MyChart) OR A paper copy in the mail If you have any lab test that is abnormal or we need to change your treatment, we will call you to review the results.   Testing/Procedures: MARCH Your physician has requested that you have an echocardiogram. Echocardiography is a painless test that uses sound waves to create images of your heart. It provides your doctor with information about the size and shape of your heart and how well your heart's chambers and valves are working. This procedure takes approximately one hour. There are no restrictions for this procedure. Please do NOT wear cologne, perfume, aftershave, or lotions (deodorant is allowed). Please arrive 15 minutes prior to your appointment time.  Please note: We ask at that you not bring children with you during ultrasound (echo/ vascular) testing. Due to room size and safety concerns, children are not allowed in the ultrasound rooms during exams. Our front office staff cannot provide observation of children in our lobby area while testing is being conducted. An adult accompanying a patient to their appointment will only be allowed in the ultrasound room at the discretion of the ultrasound technician under special circumstances. We apologize for any inconvenience.    Follow-Up: At [facility], you and your health needs are our priority.  As part of our continuing mission to provide you with exceptional heart care, we have created designated Provider Care Teams.  These Care Teams include your  primary Cardiologist (physician) and Advanced Practice Providers (APPs -  Physician Assistants and Nurse Practitioners) who all work together to provide you with the care you need, when you need it.  We recommend signing up for the patient portal called "MyChart".  Sign up information is provided on this After Visit Summary.  MyChart is used to connect with patients for Virtual Visits (Telemedicine).  Patients are able to view lab/test results, encounter notes, upcoming appointments, etc.  Non-urgent messages can be sent to your provider as well.   To learn more about what you can do with MyChart, go to [email or web address].    Your next appointment:   6 month(s)  The format for your next appointment:   In Person  Provider:   Gypsy Balsam, MD    Other Instructions NA

## 2023-08-06 ENCOUNTER — Other Ambulatory Visit: Payer: Self-pay | Admitting: Family Medicine

## 2023-08-06 ENCOUNTER — Other Ambulatory Visit: Payer: Self-pay

## 2023-08-06 DIAGNOSIS — E1142 Type 2 diabetes mellitus with diabetic polyneuropathy: Secondary | ICD-10-CM

## 2023-08-06 DIAGNOSIS — K219 Gastro-esophageal reflux disease without esophagitis: Secondary | ICD-10-CM

## 2023-08-07 ENCOUNTER — Ambulatory Visit: Payer: Medicare Other

## 2023-08-26 ENCOUNTER — Other Ambulatory Visit: Payer: Self-pay

## 2023-08-26 DIAGNOSIS — E1159 Type 2 diabetes mellitus with other circulatory complications: Secondary | ICD-10-CM

## 2023-08-27 DIAGNOSIS — G245 Blepharospasm: Secondary | ICD-10-CM | POA: Diagnosis not present

## 2023-08-29 ENCOUNTER — Ambulatory Visit: Payer: Medicare Other | Attending: Cardiology

## 2023-08-29 DIAGNOSIS — R0609 Other forms of dyspnea: Secondary | ICD-10-CM | POA: Diagnosis not present

## 2023-08-29 MED ORDER — PERFLUTREN LIPID MICROSPHERE
1.0000 mL | INTRAVENOUS | Status: AC | PRN
  Administered 2023-08-29: 10 mL via INTRAVENOUS

## 2023-08-30 LAB — ECHOCARDIOGRAM COMPLETE
AR max vel: 2.91 cm2
AV Area VTI: 2.94 cm2
AV Area mean vel: 2.85 cm2
AV Mean grad: 2.5 mmHg
AV Peak grad: 4.7 mmHg
Ao pk vel: 1.09 m/s
Area-P 1/2: 4.83 cm2
MV M vel: 3.39 m/s
MV Peak grad: 46 mmHg
S' Lateral: 3.4 cm

## 2023-09-06 ENCOUNTER — Telehealth: Payer: Self-pay

## 2023-09-06 NOTE — Telephone Encounter (Signed)
 Echo Results reviewed with pt as per Dr. Vanetta Shawl note.  Pt verbalized understanding and had no additional questions. Routed to PCP

## 2023-09-11 NOTE — Progress Notes (Unsigned)
 Subjective:  Patient ID: Paul Kent, male    DOB: 18-Mar-1941  Age: 83 y.o. MRN: [id number]  Chief Complaint  Patient presents with   Medical Management of Chronic Issues   Discussed the use of AI scribe software for clinical note transcription with the patient, who gave verbal consent to proceed.  History of Present Illness  The patient, a senior male with a history of cataracts and diabetes, presents with ongoing concerns about his vision. He reports difficulty reading despite having had cataract surgery and lens replacement. He also experiences eye fluttering and "jumping," which has been treated with botox x 2 by an ophthalmologist. The patient expresses dissatisfaction with the current state of his vision and is seeking further treatment options.  In terms of his diabetes management, the patient has stopped taking daily insulin injections as he found his blood sugar levels were within range without it. He is currently managing his diabetes with a once-weekly injection (Mounjaro) and glipizide xr 10 mg twice daily and regular blood sugar monitoring using a glucose meter. He reports that his blood sugar levels generally run around 120-130, and up to 140 after eating.  The patient also mentions a sore on his foot and a callus on his heel. He reports that walking long distances tends to exacerbate the discomfort in his foot.       Hyperlipidemia: Current medications: Atorvastatin 10 mg daily.   Hypertension/CHF: Current medications: Carvedilol 6.25 mg twice a day dropped bp too low, so stopped medication. Torsemide 20 mg daily as needed.    GERD: Famotidine 40 mg daily.   Afib: Taking Amiodarone 200 mg everyday and xarelto 15 mg every evening.   Hypothyroidism: He takes Levothyroxine 137 mcg every morning.   Diabetes:  Complications: Glucose checking:once daily usually.  Glucose logs:120-130s. Hypoglycemia: in the middle of the night.  Most recent A1C: 8.9.  Current  medications: glipizide xl 10 mg twice daily, mounjaro 7.5 mg weekly, not taking toujeo 20 U daily. On gabapentin 300 mg daily.  Last Eye Exam: Foot checks: daily.      06/07/2023   10:49 AM 03/15/2023    9:56 AM 02/06/2023   10:53 AM 10/26/2022    2:35 PM 02/13/2022    9:10 AM  Depression screen PHQ 2/9  Decreased Interest 0 0 0 0 0  Down, Depressed, Hopeless 0 0 0 0 0  PHQ - 2 Score 0 0 0 0 0  Altered sleeping  0  0   Tired, decreased energy  0  0   Change in appetite  0  0   Feeling bad or failure about yourself   0  0   Trouble concentrating  0  0   Moving slowly or fidgety/restless  0  0   Suicidal thoughts  0  0   PHQ-9 Score  0  0   Difficult doing work/chores  Not difficult at all  Not difficult at all         06/07/2023   10:49 AM  Fall Risk   Falls in the past year? 1  Number falls in past yr: 0  Injury with Fall? 1  Risk for fall due to : Impaired balance/gait;History of fall(s)  Follow up Falls evaluation completed;Falls prevention discussed    Patient Care Team: Blane Ohara, MD as PCP - General (Internal Medicine) Regan Lemming, MD as PCP - Electrophysiology (Cardiology) Georgeanna Lea, MD as PCP - Cardiology (Cardiology) Debroah Baller, MD as Consulting Physician (  Urology) Ulice Brilliant, MD (Orthopedic Surgery) Cherly Beach, MD as Referring Physician (Family Medicine) Evelena Leyden, OD (Optometry) Zettie Pho, [facility] (Inactive) as Pharmacist (Pharmacist)   Review of Systems  Constitutional:  Negative for chills, diaphoresis, fatigue and fever.  HENT:  Negative for congestion, ear pain and sore throat.   Respiratory:  Negative for cough and shortness of breath.   Cardiovascular:  Negative for chest pain and leg swelling.  Gastrointestinal:  Negative for abdominal pain, constipation, diarrhea, nausea and vomiting.  Genitourinary:  Negative for dysuria and urgency.  Musculoskeletal:  Negative for arthralgias and myalgias.  Neurological:   Negative for dizziness and headaches.  Psychiatric/Behavioral:  Negative for dysphoric mood.     Current Outpatient Medications on File Prior to Visit  Medication Sig Dispense Refill   amiodarone (PACERONE) 200 MG tablet TAKE ONE TABLET BY MOUTH EVERY DAY 90 tablet 1   atorvastatin (LIPITOR) 10 MG tablet TAKE ONE TABLET BY MOUTH EVERY EVENING 90 tablet 0   B-D UF III MINI PEN NEEDLES 31G X 5 MM MISC USE AS DIRECTED EVERY DAY to inject insulin (Patient taking differently: 1 each by Other route daily.) 100 each 1   carboxymethylcellulose (REFRESH PLUS) 0.5 % SOLN Place 1 drop into both eyes daily as needed (dry eyes).     famotidine (PEPCID) 40 MG tablet TAKE ONE TABLET BY MOUTH DAILY 90 tablet 0   gabapentin (NEURONTIN) 300 MG capsule Take 1 capsule (300 mg total) by mouth at bedtime. 90 capsule 1   glipiZIDE (GLUCOTROL XL) 10 MG 24 hr tablet TAKE ONE TABLET BY MOUTH TWICE DAILY 180 tablet 1   Rivaroxaban (XARELTO) 15 MG TABS tablet Take 1 tablet (15 mg total) by mouth daily with supper. 30 tablet 5   tamsulosin (FLOMAX) 0.4 MG CAPS capsule TAKE ONE CAPSULE BY MOUTH EVERY EVENING 30 capsule 1   testosterone cypionate (DEPOTESTOSTERONE CYPIONATE) 200 MG/ML injection INJECT 200MG  (1 MILLILITER) into the muscle EVERY 14 DAYS (Patient taking differently: Inject 200 mg into the muscle every 14 (fourteen) days.) 10 mL 0   torsemide (DEMADEX) 20 MG tablet Take 1 tablet (20 mg total) by mouth daily as needed (For weight gain of 3 pds in 1 day). 90 tablet 2   No current facility-administered medications on file prior to visit.   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   Cellulitis of right lower limb    CHF (congestive heart failure) (HCC) 02/2018   Chronic atrial  fibrillation (HCC)    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) 04/26/2021   Corns and callosities    Deviated septum 07/01/2018   Deviated septum 07/01/2018   Diabetes mellitus due to underlying condition with unspecified complications (HCC) 06/08/2016   Diabetic ulcer of right heel associated with diabetes mellitus due to underlying condition, limited to breakdown of skin (HCC) 07/31/2021   Disturbances of salivary secretion    Epistaxis    Essential hypertension    Essential tremor    H pylori ulcer    H pylori ulcer    Herpes zoster with nervous system complication 06/03/2013   Hesitancy of micturition    Hyperlipidemia    Hypothyroidism    Impacted  cerumen, bilateral    Jaw pain    Lesion of femoral nerve 04/30/2013   Localized edema    Male erectile disorder    Malignant 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)    Nasal congestion 07/01/2018   Nasal turbinate hypertrophy 07/01/2018   Obstructive sleep apnea 01/17/2017   Other constipation    Other fatigue    Overweight    Pacemaker reprogramming/check 02/12/2017   Pain in right finger(s)    Pain in right lower leg    Paroxysmal atrial fibrillation (HCC) 06/08/2016   Peptic ulcer disease    Persistent atrial fibrillation (HCC) 06/01/2017   Primary insomnia    Renal stones    Renal stones    Secondary hypercoagulable state (HCC) 06/17/2020   Shingles 2014   WITH COMPLICATIONS (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)   Shingles 06/2012   Shortness of breath    Sick sinus syndrome (HCC)    Sinus bradycardia 03/07/2017   Sleep apnea    dx 08/2015, CPAP   Spontaneous ecchymoses    Status post ablation of atrial fibrillation 09/16/2020   Testicular hypofunction    Thoracic or lumbosacral neuritis or radiculitis 06/03/2013   Trigger middle finger of right hand 11/28/2019   Trigger middle finger of right hand 11/28/2019   Type 2 diabetes mellitus with circulatory disorder,  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   Past Surgical History:  Procedure Laterality Date   ABLATION  06/2017   ATRIAL FIBRILLATION ABLATION N/A 05/21/2020   Procedure: ATRIAL FIBRILLATION ABLATION;  Surgeon: Regan Lemming, MD;  Location: [facility] INVASIVE CV LAB;  Service: Cardiovascular;  Laterality: N/A;   CARDIOVERSION N/A 03/22/2018   Procedure: CARDIOVERSION;  Surgeon: Jodelle Red, MD;  Location: [facility] ENDOSCOPY;  Service: Cardiovascular;  Laterality: N/A;   CARDIOVERSION N/A 07/08/2020   Procedure: CARDIOVERSION;  Surgeon: Jake Bathe, MD;  Location: [facility] ENDOSCOPY;  Service: Cardiovascular;  Laterality: N/A;   HEMIARTHROPLASTY HIP  10/31/2013   IT HIP BIPOLAR   LUMBAR SPINE SURGERY     PACEMAKER PLACEMENT  01/29/2017   SHOULDER SURGERY Right     Family History  Problem Relation Age of Onset   Heart attack Mother    Transient ischemic attack Mother    Heart Problems Father    Tuberculosis Brother    Diabetes type II Other    Hyperlipidemia Other    Congestive Heart Failure Other    Arthritis Other    Social History   Socioeconomic History   Marital status: Married    Spouse name: Kennon Rounds   Number of children: 3   Years of education: Not on file   Highest education level: Not on file  Occupational History   Occupation: RETIRED    Comment: school principal  Tobacco Use   Smoking status: Never   Smokeless tobacco: Never  Vaping Use   Vaping status: Never Used  Substance and Sexual Activity   Alcohol use: Not Currently   Drug use: Never   Sexual activity: Not on file  Other Topics Concern   Not on file  Social History Narrative   Lives with wife   Social Drivers of Health   Financial Resource Strain: Low Risk  (05/30/2023)   Overall Financial Resource Strain (CARDIA)    Difficulty of Paying Living Expenses: Not hard at all  Food Insecurity: No Food Insecurity (09/12/2023)   Hunger Vital Sign    Worried About Running Out of Food in  the  Last Year: Never true    Ran Out of Food in the Last Year: Never true  Transportation Needs: No Transportation Needs (09/12/2023)   PRAPARE - Administrator, Civil Service (Medical): No    Lack of Transportation (Non-Medical): No  Physical Activity: Sufficiently Active (05/30/2023)   Exercise Vital Sign    Days of Exercise per Week: 4 days    Minutes of Exercise per Session: 60 min  Stress: No Stress Concern Present (05/30/2023)   [employer] of Occupational Health - Occupational Stress Questionnaire    Feeling of Stress : Not at all  Social Connections: Socially Integrated (05/30/2023)   Social Connection and Isolation Panel [NHANES]    Frequency of Communication with Friends and Family: More than three times a week    Frequency of Social Gatherings with Friends and Family: More than three times a week    Attends Religious Services: More than 4 times per year    Active Member of [employer] or Organizations: Yes    Attends Engineer, structural: More than 4 times per year    Marital Status: Married    Objective:  BP 108/62   Pulse 78   Temp 97.8 F (36.6 C)   Ht 6\' 3"  (1.905 m)   Wt 248 lb (112.5 kg)   SpO2 98%   BMI 31.00 kg/m      09/12/2023    8:50 AM 08/03/2023   10:06 AM 06/25/2023   12:01 PM  BP/Weight  Systolic BP 108 112 118  Diastolic BP 62 62 58  Wt. (Lbs) 248 245.8 240  BMI 31 kg/m2 30.72 kg/m2 30 kg/m2    Physical Exam Vitals reviewed.  Constitutional:      Appearance: Normal appearance.  Neck:     Vascular: No carotid bruit.  Cardiovascular:     Rate and Rhythm: Normal rate and regular rhythm.     Heart sounds: Normal heart sounds.  Pulmonary:     Effort: Pulmonary effort is normal.     Breath sounds: Normal breath sounds. No wheezing, rhonchi or rales.  Abdominal:     General: Bowel sounds are normal.     Palpations: Abdomen is soft.     Tenderness: There is no abdominal tenderness.  Neurological:     Mental Status: He is  alert and oriented to person, place, and time.  Psychiatric:        Mood and Affect: Mood normal.        Behavior: Behavior normal.     Diabetic Foot Exam - Simple   No data filed      Lab Results  Component Value Date   WBC 4.1 09/12/2023   HGB 12.4 (L) 09/12/2023   HCT 37.6 09/12/2023   PLT 127 (L) 09/12/2023   GLUCOSE 116 (H) 09/12/2023   CHOL 122 09/12/2023   TRIG 65 09/12/2023   HDL 55 09/12/2023   LDLCALC 53 09/12/2023   ALT 12 09/12/2023   AST 19 09/12/2023   NA 140 09/12/2023   K 5.2 09/12/2023   CL 104 09/12/2023   CREATININE 1.70 (H) 09/12/2023   BUN 23 09/12/2023   CO2 21 09/12/2023   TSH 4.010 05/30/2023   HGBA1C 6.2 (H) 09/12/2023   MICROALBUR 80 01/20/2021      Assessment & Plan:  Assessment and Plan   Diabetes Mellitus Type 2   Diabetes managed with Mounjaro, glipizide, and previously insulin (Toujeo). Reports well-managed blood glucose with occasional nocturnal hypoglycemia, managed by eating  peanut butter before bed. Last A1c was 8.9, indicating suboptimal control. Prefers Mounjaro over glipizide due to weight loss benefits. Plans to increase Mounjaro dosage while discontinuing glipizide. Will reassess based on A1c results.   - Discontinue glipizide   - Increase Mounjaro dosage   - Check A1c and adjust treatment based on results   - Encourage daily blood glucose monitoring   - Consider reapplying continuous glucose monitor if needed    Diabetic Neuropathy   Experiences neuropathy symptoms, particularly in the feet, managed with gabapentin. Reports a foot sore that is being monitored but not currently causing significant issues.   - Monitor foot sore closely   - Encourage daily foot checks    Atrial Fibrillation   Managed with amiodarone and Xarelto. No acute issues discussed.    Hyperlipidemia   Managed with atorvastatin. No acute issues discussed.    Gastroesophageal Reflux Disease (GERD)   Managed with famotidine. No acute issues  discussed.    Hypothyroidism   Managed with thyroid medication. Plans to check thyroid levels during this visit.   - Check thyroid levels    Presbyopia   Reports difficulty reading without glasses despite cataract surgery and lens implants. Common post-cataract surgery outcome where patients can see far but require reading glasses for near vision. Has multiple pairs of reading glasses but finds them inconvenient.   - Continue using reading glasses for near vision    General Health Maintenance   Due for a tetanus shot, covered by Medicare at the pharmacy. Encouraged to maintain physical activity and monitor foot health.   - Obtain tetanus shot at pharmacy   - Encourage regular physical activity       Persistent atrial fibrillation [facility]) Assessment & Plan: Managed with amiodarone and Xarelto. No acute issues discussed.     Hypertensive heart and renal disease, stage 1-4 or unspecified chronic kidney disease, with heart failure (HCC) Assessment & Plan: Well controlled.  Continue torsemide 20 mg daily as needed .   Orders: -     CBC with Differential/Platelet -     Comprehensive metabolic panel with GFR  OSA on CPAP Assessment & Plan: Conitnue wih CPAP with oxygen 2 L at night.    Acquired hypothyroidism Assessment & Plan: Managed with thyroid medication. Plans to check thyroid levels during this visit.   - Check thyroid levels     Diabetic polyneuropathy associated with type 2 diabetes mellitus (HCC) Assessment & Plan: Experiences neuropathy symptoms, particularly in the feet, managed with gabapentin. Reports a foot sore that is being monitored but not currently causing significant issues.   - Monitor foot sore closely   - Encourage daily foot checks    Orders: -     Hemoglobin A1c -     Microalbumin / creatinine urine ratio  Mixed hyperlipidemia Assessment & Plan: Managed with atorvastatin. No acute issues discussed.    Orders: -     Lipid panel  Diabetic  glomerulopathy (HCC) Assessment & Plan: Diabetes managed with Mounjaro, glipizide, and previously insulin (Toujeo). Reports well-managed blood glucose with occasional nocturnal hypoglycemia, managed by eating peanut butter before bed. Last A1c was 8.9, indicating suboptimal control. Prefers Mounjaro over glipizide due to weight loss benefits. Plans to increase Mounjaro dosage while discontinuing glipizide. Will reassess based on A1c results.   - Discontinue glipizide   - Increase Mounjaro dosage   - Check A1c and adjust treatment based on results   - Encourage daily blood glucose monitoring   - Consider reapplying  continuous glucose monitor if needed     Gastroesophageal reflux disease without esophagitis Assessment & Plan: Managed with atorvastatin. No acute issues discussed.     Presbyopia Assessment & Plan: Reports difficulty reading without glasses despite cataract surgery and lens implants. Common post-cataract surgery outcome where patients can see far but require reading glasses for near vision. Has multiple pairs of reading glasses but finds them inconvenient.   - Continue using reading glasses for near vision        No orders of the defined types were placed in this encounter.   Orders Placed This Encounter  Procedures   CBC with Differential/Platelet   Comprehensive metabolic panel   Hemoglobin A1c   Lipid panel   Microalbumin / creatinine urine ratio     Follow-up: Return in about 3 months (around 12/13/2023) for chronic follow up.   I,Marla I Leal-Borjas,acting as a scribe for Blane Ohara, MD.,have documented all relevant documentation on the behalf of Blane Ohara, MD,as directed by  Blane Ohara, MD while in the presence of Blane Ohara, MD.   An After Visit Summary was printed and given to the patient.  Blane Ohara, MD [facility] [phone]

## 2023-09-12 ENCOUNTER — Other Ambulatory Visit: Payer: Self-pay | Admitting: Family Medicine

## 2023-09-12 ENCOUNTER — Ambulatory Visit (INDEPENDENT_AMBULATORY_CARE_PROVIDER_SITE_OTHER): Payer: Medicare Other | Admitting: Family Medicine

## 2023-09-12 ENCOUNTER — Telehealth: Payer: Self-pay

## 2023-09-12 ENCOUNTER — Encounter: Payer: Self-pay | Admitting: Family Medicine

## 2023-09-12 VITALS — BP 108/62 | HR 78 | Temp 97.8°F | Ht 75.0 in | Wt 248.0 lb

## 2023-09-12 DIAGNOSIS — G245 Blepharospasm: Secondary | ICD-10-CM

## 2023-09-12 DIAGNOSIS — K219 Gastro-esophageal reflux disease without esophagitis: Secondary | ICD-10-CM

## 2023-09-12 DIAGNOSIS — I4819 Other persistent atrial fibrillation: Secondary | ICD-10-CM

## 2023-09-12 DIAGNOSIS — H524 Presbyopia: Secondary | ICD-10-CM | POA: Diagnosis not present

## 2023-09-12 DIAGNOSIS — E1142 Type 2 diabetes mellitus with diabetic polyneuropathy: Secondary | ICD-10-CM

## 2023-09-12 DIAGNOSIS — E1121 Type 2 diabetes mellitus with diabetic nephropathy: Secondary | ICD-10-CM

## 2023-09-12 DIAGNOSIS — E039 Hypothyroidism, unspecified: Secondary | ICD-10-CM

## 2023-09-12 DIAGNOSIS — G4733 Obstructive sleep apnea (adult) (pediatric): Secondary | ICD-10-CM | POA: Diagnosis not present

## 2023-09-12 DIAGNOSIS — I130 Hypertensive heart and chronic kidney disease with heart failure and stage 1 through stage 4 chronic kidney disease, or unspecified chronic kidney disease: Secondary | ICD-10-CM | POA: Diagnosis not present

## 2023-09-12 DIAGNOSIS — E782 Mixed hyperlipidemia: Secondary | ICD-10-CM | POA: Diagnosis not present

## 2023-09-12 NOTE — Assessment & Plan Note (Signed)
Conitnue wih CPAP with oxygen 2 L at night.

## 2023-09-12 NOTE — Assessment & Plan Note (Signed)
 Previously well controlled Continue Synthroid at current dose of 150 mcg once daily in am.

## 2023-09-12 NOTE — Assessment & Plan Note (Signed)
 Well controlled.  Continue torsemide 20 mg daily as needed .

## 2023-09-12 NOTE — Assessment & Plan Note (Signed)
Well controlled.  ?No changes to medicines. Continue Atorvastatin 10 mg daily. ?Continue to work on eating a healthy diet and exercise.  ?Labs drawn today.  ? ?

## 2023-09-12 NOTE — Telephone Encounter (Signed)
 Patient has noted increase in AF burden (100%) appears greater than 7 days. PAF hx, on OAC.  Ventricular rates appear controlled.   LM to call and assess symptoms.

## 2023-09-12 NOTE — Assessment & Plan Note (Signed)
 Control: not at goal. Recommend check feet daily. Recommend annual eye exams. Medicines: Glipizide 10 mg twice a day, farxiga 10 mg daily, and insulin glargine 20 U before bed, Increase mounjaro to 7.5 mg once weekly. If hypoglycemia occurs, recommend hold glipizde.  Continue to work on eating a healthy diet and exercise.  Labs drawn today.

## 2023-09-12 NOTE — Assessment & Plan Note (Signed)
Continue taking Amiodarone 200 mg everyday, carvedilol 6.25 mg twice daily. xarelto 15 mg every evening.

## 2023-09-13 ENCOUNTER — Encounter: Payer: Self-pay | Admitting: Family Medicine

## 2023-09-13 ENCOUNTER — Other Ambulatory Visit: Payer: Self-pay | Admitting: Family Medicine

## 2023-09-13 DIAGNOSIS — H524 Presbyopia: Secondary | ICD-10-CM | POA: Insufficient documentation

## 2023-09-13 DIAGNOSIS — E1142 Type 2 diabetes mellitus with diabetic polyneuropathy: Secondary | ICD-10-CM

## 2023-09-13 LAB — LIPID PANEL
Chol/HDL Ratio: 2.2 ratio (ref 0.0–5.0)
Cholesterol, Total: 122 mg/dL (ref 100–199)
HDL: 55 mg/dL (ref >39)
LDL Chol Calc (NIH): 53 mg/dL (ref 0–99)
Triglycerides: 65 mg/dL (ref 0–149)
VLDL Cholesterol Cal: 14 mg/dL (ref 5–40)

## 2023-09-13 LAB — COMPREHENSIVE METABOLIC PANEL WITH GFR
ALT: 12 IU/L (ref 0–44)
AST: 19 IU/L (ref 0–40)
Albumin: 3.9 g/dL (ref 3.7–4.7)
Alkaline Phosphatase: 115 IU/L (ref 44–121)
BUN/Creatinine Ratio: 14 (ref 10–24)
BUN: 23 mg/dL (ref 8–27)
Bilirubin Total: 0.4 mg/dL (ref 0.0–1.2)
CO2: 21 mmol/L (ref 20–29)
Calcium: 8.9 mg/dL (ref 8.6–10.2)
Chloride: 104 mmol/L (ref 96–106)
Creatinine, Ser: 1.7 mg/dL — ABNORMAL HIGH (ref 0.76–1.27)
Globulin, Total: 2.7 g/dL (ref 1.5–4.5)
Glucose: 116 mg/dL — ABNORMAL HIGH (ref 70–99)
Potassium: 5.2 mmol/L (ref 3.5–5.2)
Sodium: 140 mmol/L (ref 134–144)
Total Protein: 6.6 g/dL (ref 6.0–8.5)
eGFR: 39 mL/min/{1.73_m2} — ABNORMAL LOW (ref >59)

## 2023-09-13 LAB — CBC WITH DIFFERENTIAL/PLATELET
Basophils Absolute: 0.1 10*3/uL (ref 0.0–0.2)
Basos: 2 %
EOS (ABSOLUTE): 0.2 10*3/uL (ref 0.0–0.4)
Eos: 4 %
Hematocrit: 37.6 % (ref 37.5–51.0)
Hemoglobin: 12.4 g/dL — ABNORMAL LOW (ref 13.0–17.7)
Immature Grans (Abs): 0 10*3/uL (ref 0.0–0.1)
Immature Granulocytes: 0 %
Lymphocytes Absolute: 1 10*3/uL (ref 0.7–3.1)
Lymphs: 24 %
MCH: 33.9 pg — ABNORMAL HIGH (ref 26.6–33.0)
MCHC: 33 g/dL (ref 31.5–35.7)
MCV: 103 fL — ABNORMAL HIGH (ref 79–97)
Monocytes Absolute: 0.6 10*3/uL (ref 0.1–0.9)
Monocytes: 15 %
Neutrophils Absolute: 2.3 10*3/uL (ref 1.4–7.0)
Neutrophils: 55 %
Platelets: 127 10*3/uL — ABNORMAL LOW (ref 150–450)
RBC: 3.66 x10E6/uL — ABNORMAL LOW (ref 4.14–5.80)
RDW: 13.2 % (ref 11.6–15.4)
WBC: 4.1 10*3/uL (ref 3.4–10.8)

## 2023-09-13 LAB — MICROALBUMIN / CREATININE URINE RATIO
Creatinine, Urine: 46.5 mg/dL
Microalb/Creat Ratio: 16 mg/g{creat} (ref 0–29)
Microalbumin, Urine: 7.5 ug/mL

## 2023-09-13 LAB — HEMOGLOBIN A1C
Est. average glucose Bld gHb Est-mCnc: 131 mg/dL
Hgb A1c MFr Bld: 6.2 % — ABNORMAL HIGH (ref 4.8–5.6)

## 2023-09-13 MED ORDER — TIRZEPATIDE 10 MG/0.5ML ~~LOC~~ SOAJ: 10.0000 mg | SUBCUTANEOUS | 0 refills | Status: DC

## 2023-09-13 NOTE — Assessment & Plan Note (Deleted)
 Diabetes managed with Mounjaro, glipizide, and previously insulin (Toujeo). Reports well-managed blood glucose with occasional nocturnal hypoglycemia, managed by eating peanut butter before bed. Last A1c was 8.9, indicating suboptimal control. Prefers Mounjaro over glipizide due to weight loss benefits. Plans to increase Mounjaro dosage while discontinuing glipizide. Will reassess based on A1c results.   - Discontinue glipizide   - Increase Mounjaro dosage   - Check A1c and adjust treatment based on results   - Encourage daily blood glucose monitoring   - Consider reapplying continuous glucose monitor if needed

## 2023-09-13 NOTE — Assessment & Plan Note (Signed)
 Diabetes managed with Mounjaro, glipizide, and previously insulin (Toujeo). Reports well-managed blood glucose with occasional nocturnal hypoglycemia, managed by eating peanut butter before bed. Last A1c was 8.9, indicating suboptimal control. Prefers Mounjaro over glipizide due to weight loss benefits. Plans to increase Mounjaro dosage while discontinuing glipizide. Will reassess based on A1c results.   - Discontinue glipizide   - Increase Mounjaro dosage   - Check A1c and adjust treatment based on results   - Encourage daily blood glucose monitoring   - Consider reapplying continuous glucose monitor if needed

## 2023-09-13 NOTE — Assessment & Plan Note (Signed)
 Managed with atorvastatin. No acute issues discussed.

## 2023-09-13 NOTE — Telephone Encounter (Signed)
 Patient reports asymptomatic. Reports compliance with xarelto 15 mg daily amiodarone 200 mg daily. Recommended patient see AF clinic to discuss further. Patient would like to recheck AF on remotes in 1 week then readdress AF clinic referral.   Pt agrees to call if any symptoms arise.

## 2023-09-13 NOTE — Assessment & Plan Note (Signed)
 Well controlled.  Continue to check sugars.  Experiences neuropathy symptoms, particularly in the feet, managed with gabapentin. Reports a foot sore that is being monitored but not currently causing significant issues.   - Monitor foot sore closely   - Encourage daily foot checks   STOP GLIPIZIDE. Increase mounjaro to 10 mg weekly. Remain off insulin.

## 2023-09-13 NOTE — Assessment & Plan Note (Signed)
 Reports difficulty reading without glasses despite cataract surgery and lens implants. Common post-cataract surgery outcome where patients can see far but require reading glasses for near vision. Has multiple pairs of reading glasses but finds them inconvenient.   - Continue using reading glasses for near vision

## 2023-09-13 NOTE — Assessment & Plan Note (Signed)
 Managed with amiodarone and Xarelto. No acute issues discussed.

## 2023-09-13 NOTE — Assessment & Plan Note (Signed)
 Managed with thyroid medication. Plans to check thyroid levels during this visit.   - Check thyroid levels

## 2023-09-13 NOTE — Telephone Encounter (Signed)
Pt called back returning call.

## 2023-09-14 ENCOUNTER — Ambulatory Visit (INDEPENDENT_AMBULATORY_CARE_PROVIDER_SITE_OTHER): Payer: Medicare Other

## 2023-09-14 DIAGNOSIS — I495 Sick sinus syndrome: Secondary | ICD-10-CM

## 2023-09-14 DIAGNOSIS — G245 Blepharospasm: Secondary | ICD-10-CM | POA: Insufficient documentation

## 2023-09-14 LAB — CUP PACEART REMOTE DEVICE CHECK
Battery Voltage: 35
Date Time Interrogation Session: 20250328081913
Implantable Lead Connection Status: 753985
Implantable Lead Connection Status: 753985
Implantable Lead Implant Date: 20180813
Implantable Lead Implant Date: 20180813
Implantable Lead Location: 753859
Implantable Lead Location: 753860
Implantable Lead Model: 377
Implantable Lead Model: 377
Implantable Lead Serial Number: 49893169
Implantable Lead Serial Number: 50011411
Implantable Pulse Generator Implant Date: 20180813
Pulse Gen Model: 407145
Pulse Gen Serial Number: 69158272

## 2023-09-14 NOTE — Assessment & Plan Note (Signed)
 Request records and consider alternative ophthalmology referral.

## 2023-09-17 ENCOUNTER — Encounter: Payer: Self-pay | Admitting: Family Medicine

## 2023-09-18 ENCOUNTER — Other Ambulatory Visit: Payer: Self-pay | Admitting: Family Medicine

## 2023-09-20 ENCOUNTER — Ambulatory Visit (INDEPENDENT_AMBULATORY_CARE_PROVIDER_SITE_OTHER)

## 2023-09-20 DIAGNOSIS — I495 Sick sinus syndrome: Secondary | ICD-10-CM

## 2023-09-26 NOTE — Telephone Encounter (Signed)
 Remote transmission was completed and shows AF near 100% per trends.   Will need to see if device can complete quick check tomorrow 09/27/23 to review presenting rhythm.

## 2023-09-27 ENCOUNTER — Other Ambulatory Visit: Payer: Self-pay

## 2023-09-27 DIAGNOSIS — R3589 Other polyuria: Secondary | ICD-10-CM

## 2023-09-28 NOTE — Telephone Encounter (Signed)
 Called patient and discussed him going to [place] to discuss cardioversion with Wallis Bamberg. Patient was open to plan. Message sent to scheduler to make appointment and notify patient of the date and time via telephone as requested by the patient.

## 2023-10-01 ENCOUNTER — Other Ambulatory Visit: Payer: Self-pay | Admitting: Physician Assistant

## 2023-10-01 ENCOUNTER — Ambulatory Visit (INDEPENDENT_AMBULATORY_CARE_PROVIDER_SITE_OTHER)

## 2023-10-01 DIAGNOSIS — E1142 Type 2 diabetes mellitus with diabetic polyneuropathy: Secondary | ICD-10-CM

## 2023-10-01 DIAGNOSIS — I495 Sick sinus syndrome: Secondary | ICD-10-CM

## 2023-10-03 ENCOUNTER — Telehealth: Payer: Self-pay

## 2023-10-03 NOTE — Telephone Encounter (Signed)
 PA submitted and denied via covermymeds for dexcom, patient does not administer multiple injections.

## 2023-10-11 ENCOUNTER — Encounter: Payer: Self-pay | Admitting: Cardiology

## 2023-10-11 ENCOUNTER — Ambulatory Visit: Attending: Cardiology | Admitting: Cardiology

## 2023-10-11 VITALS — BP 100/62 | HR 72 | Ht 75.0 in | Wt 241.8 lb

## 2023-10-11 DIAGNOSIS — E782 Mixed hyperlipidemia: Secondary | ICD-10-CM | POA: Insufficient documentation

## 2023-10-11 DIAGNOSIS — Z950 Presence of cardiac pacemaker: Secondary | ICD-10-CM | POA: Insufficient documentation

## 2023-10-11 DIAGNOSIS — I4819 Other persistent atrial fibrillation: Secondary | ICD-10-CM | POA: Insufficient documentation

## 2023-10-11 DIAGNOSIS — G4733 Obstructive sleep apnea (adult) (pediatric): Secondary | ICD-10-CM | POA: Insufficient documentation

## 2023-10-11 DIAGNOSIS — I480 Paroxysmal atrial fibrillation: Secondary | ICD-10-CM | POA: Insufficient documentation

## 2023-10-11 DIAGNOSIS — D6869 Other thrombophilia: Secondary | ICD-10-CM | POA: Diagnosis not present

## 2023-10-11 DIAGNOSIS — F959 Tic disorder, unspecified: Secondary | ICD-10-CM | POA: Diagnosis not present

## 2023-10-11 NOTE — Addendum Note (Signed)
 Addended by: Shawnee Dellen D on: 10/11/2023 11:10 AM   Modules accepted: Orders

## 2023-10-11 NOTE — Progress Notes (Signed)
 Cardiology Office Note:    Date:  10/11/2023   ID:  Paul Kent, Paul Kent 12/11/1940, MRN [id number]  PCP:  Mercy Stall, MD  Cardiologist:  Ralene Burger, MD    Referring MD: Mercy Stall, MD   Chief Complaint  Patient presents with   Follow-up    History of Present Illness:    Paul Kent is a 83 y.o. male past medical history significant for paroxysmal atrial fibrillation, status post atrial fibrillation ablation done in 2019 2021 now on amiodarone , cardiomyopathy ejection fraction 40 to 45%, however echocardiogram recently done in March showed normal left ventricle ejection fraction.  Comes today to months for follow-up.  He is doing fine.  He he was noted on pacemaker to be in atrial fibrillation, 200 mg of amiodarone  twice daily was given which is higher dose that his regular dose which is 200 mg daily he comes for follow-up today he is in normal rhythm he is feeling fine denies have any chest pain tightness squeezing pressure burning chest, goes to gym on the regular basis biggest complaint is his eyes he got blinking look like almost tik  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) 04/26/2021   Corns and callosities    Deviated septum 07/01/2018   Deviated septum 07/01/2018   Diabetes mellitus due to underlying condition with unspecified complications (HCC) 06/08/2016   Diabetic ulcer of right heel associated with diabetes mellitus due to underlying condition, limited to breakdown of skin (HCC) 07/31/2021   Disturbances of salivary secretion    Epistaxis     Essential hypertension    Essential tremor    H pylori ulcer    H pylori ulcer    Herpes zoster with nervous system complication 06/03/2013   Hesitancy of micturition    Hyperlipidemia    Hypothyroidism    Impacted cerumen, bilateral    Jaw pain    Lesion of femoral nerve 04/30/2013   Localized edema    Male erectile disorder    Malignant 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)    Nasal congestion 07/01/2018   Nasal turbinate hypertrophy 07/01/2018   Obstructive sleep apnea 01/17/2017   Other constipation    Other fatigue    Overweight    Pacemaker reprogramming/check 02/12/2017   Pain in right finger(s)    Pain in right lower leg    Paroxysmal atrial fibrillation (HCC) 06/08/2016   Peptic ulcer disease    Persistent atrial fibrillation (HCC) 06/01/2017   Primary insomnia    Renal stones    Renal stones    Secondary hypercoagulable state (HCC) 06/17/2020   Shingles 2014   WITH COMPLICATIONS (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)   Shingles 06/2012   Shortness of breath    Sick sinus syndrome (HCC)    Sinus bradycardia 03/07/2017   Sleep apnea    dx 08/2015, CPAP   Spontaneous ecchymoses    Status post ablation of atrial fibrillation 09/16/2020   Testicular hypofunction    Thoracic or lumbosacral  neuritis or radiculitis 06/03/2013   Trigger middle finger of right hand 11/28/2019   Trigger middle finger of right hand 11/28/2019   Type 2 diabetes mellitus with circulatory disorder, 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    Past Surgical History:  Procedure Laterality Date   ABLATION  06/2017   ATRIAL FIBRILLATION ABLATION N/A 05/21/2020   Procedure: ATRIAL FIBRILLATION ABLATION;  Surgeon: Lei Pump, MD;  Location: [facility] INVASIVE [facility];  Service: Cardiovascular;  Laterality: N/A;   CARDIOVERSION N/A 03/22/2018   Procedure: CARDIOVERSION;  Surgeon: Sheryle Donning, MD;  Location: [facility] ENDOSCOPY;  Service:  Cardiovascular;  Laterality: N/A;   CARDIOVERSION N/A 07/08/2020   Procedure: CARDIOVERSION;  Surgeon: Hugh Madura, MD;  Location: [facility] ENDOSCOPY;  Service: Cardiovascular;  Laterality: N/A;   HEMIARTHROPLASTY HIP  10/31/2013   IT HIP BIPOLAR   LUMBAR SPINE SURGERY     PACEMAKER PLACEMENT  01/29/2017   SHOULDER SURGERY Right     Current Medications: Current Meds  Medication Sig   amiodarone  (PACERONE ) 200 MG tablet TAKE ONE TABLET BY MOUTH EVERY DAY   atorvastatin  (LIPITOR) 10 MG tablet TAKE ONE TABLET BY MOUTH EVERY EVENING   gabapentin (NEURONTIN) 300 MG capsule Take 1 capsule (300 mg total) by mouth at bedtime.   levothyroxine  (SYNTHROID ) 137 MCG tablet TAKE ONE TABLET BY MOUTH EVERY MORNING   primidone  (MYSOLINE ) 50 MG tablet Take 1 tablet (50 mg total) by mouth 2 (two) times daily.   Rivaroxaban  (XARELTO ) 15 MG TABS tablet Take 1 tablet (15 mg total) by mouth daily with supper.   tamsulosin  (FLOMAX ) 0.4 MG CAPS capsule TAKE ONE CAPSULE BY MOUTH EVERY EVENING   [DISCONTINUED] B-D UF III MINI PEN NEEDLES 31G X 5 MM MISC USE AS DIRECTED EVERY DAY to inject insulin (Patient taking differently: 1 each by Other route daily.)   [DISCONTINUED] carboxymethylcellulose (REFRESH PLUS) 0.5 % SOLN Place 1 drop into both eyes daily as needed (dry eyes).   [DISCONTINUED] Continuous Glucose Sensor (DEXCOM G7 SENSOR) MISC To use as directed to monitor glucose (Patient taking differently: 1 each by Other route daily. To use as directed to monitor glucose)   [DISCONTINUED] famotidine  (PEPCID ) 40 MG tablet TAKE ONE TABLET BY MOUTH DAILY   [DISCONTINUED] glipiZIDE  (GLUCOTROL  XL) 10 MG 24 hr tablet TAKE ONE TABLET BY MOUTH TWICE DAILY   [DISCONTINUED] testosterone  cypionate (DEPOTESTOSTERONE CYPIONATE) 200 MG/ML injection INJECT 200MG  (1 MILLILITER) into the muscle EVERY 14 DAYS (Patient taking differently: Inject 200 mg into the muscle every 14 (fourteen) days.)   [DISCONTINUED] tirzepatide  (MOUNJARO ) 10  MG/0.5ML Pen Inject 10 mg into the skin once a week.   [DISCONTINUED] torsemide  (DEMADEX ) 20 MG tablet Take 1 tablet (20 mg total) by mouth daily as needed (For weight gain of 3 pds in 1 day).     Allergies:   Propranolol, Canagliflozin, Clonidine derivatives, Hydralazine  hcl, Metformin and related, Topiramate, Ambien  [zolpidem ], Farxiga  [dapagliflozin ], and Ozempic  (0.25 or 0.5 mg-dose) [semaglutide (0.25 or 0.5mg -dos)]   Social History   Socioeconomic History   Marital status: Married    Spouse name: Dwain Giovanni   Number of children: 3   Years of education: Not on file   Highest education level: Not on file  Occupational History   Occupation: RETIRED    Comment: school principal  Tobacco Use   Smoking status: Never   Smokeless tobacco: Never  Vaping Use   Vaping status: Never Used  Substance and Sexual Activity   Alcohol use: Not Currently  Drug use: Never   Sexual activity: Not on file  Other Topics Concern   Not on file  Social History Narrative   Lives with wife   Social Drivers of Health   Financial Resource Strain: Low Risk  (05/30/2023)   Overall Financial Resource Strain (CARDIA)    Difficulty of Paying Living Expenses: Not hard at all  Food Insecurity: No Food Insecurity (09/12/2023)   Hunger Vital Sign    Worried About Running Out of Food in the Last Year: Never true    Ran Out of Food in the Last Year: Never true  Transportation Needs: No Transportation Needs (09/12/2023)   PRAPARE - Administrator, Civil Service (Medical): No    Lack of Transportation (Non-Medical): No  Physical Activity: Sufficiently Active (05/30/2023)   Exercise Vital Sign    Days of Exercise per Week: 4 days    Minutes of Exercise per Session: 60 min  Stress: No Stress Concern Present (05/30/2023)   [employer] of Occupational Health - Occupational Stress Questionnaire    Feeling of Stress : Not at all  Social Connections: Socially Integrated (05/30/2023)   Social  Connection and Isolation Panel [NHANES]    Frequency of Communication with Friends and Family: More than three times a week    Frequency of Social Gatherings with Friends and Family: More than three times a week    Attends Religious Services: More than 4 times per year    Active Member of [employer] or Organizations: Yes    Attends Engineer, structural: More than 4 times per year    Marital Status: Married     Family History: The patient's family history includes Arthritis in an other family member; Congestive Heart Failure in an other family member; Diabetes type II in an other family member; Heart Problems in his father; Heart attack in his mother; Hyperlipidemia in an other family member; Transient ischemic attack in his mother; Tuberculosis in his brother. ROS:   Please see the history of present illness.    All 14 point review of systems negative except as described per history of present illness  EKGs/Labs/Other Studies Reviewed:    EKG Interpretation Date/Time:  Thursday October 11 2023 10:04:35 EDT Ventricular Rate:  72 PR Interval:  256 QRS Duration:  188 QT Interval:  548 QTC Calculation: 600 R Axis:   102  Text Interpretation: AV dual-paced rhythm with prolonged AV conduction Abnormal ECG When compared with ECG of 03-Aug-2023 10:03, Vent. rate has increased BY  13 BPM Confirmed by Ralene Burger [phone]) on 10/11/2023 10:10:32 AM    Recent Labs: 01/23/2023: NT-Pro BNP 3,185 05/30/2023: TSH 4.010 09/12/2023: ALT 12; BUN 23; Creatinine, Ser 1.70; Hemoglobin 12.4; Platelets 127; Potassium 5.2; Sodium 140  Recent Lipid Panel    Component Value Date/Time   CHOL 122 09/12/2023 1002   TRIG 65 09/12/2023 1002   HDL 55 09/12/2023 1002   CHOLHDL 2.2 09/12/2023 1002   LDLCALC 53 09/12/2023 1002    Physical Exam:    VS:  BP 100/62 (BP Location: Right Arm, Patient Position: Sitting)   Pulse 72   Ht 6\' 3"  (1.905 m)   Wt 241 lb 12.8 oz (109.7 kg)   SpO2 97%   BMI 30.22  kg/m     Wt Readings from Last 3 Encounters:  10/11/23 241 lb 12.8 oz (109.7 kg)  09/12/23 248 lb (112.5 kg)  08/03/23 245 lb 12.8 oz (111.5 kg)     GEN:  Well nourished,  well developed in no acute distress HEENT: Normal NECK: No JVD; No carotid bruits LYMPHATICS: No lymphadenopathy CARDIAC: RRR, no murmurs, no rubs, no gallops RESPIRATORY:  Clear to auscultation without rales, wheezing or rhonchi  ABDOMEN: Soft, non-tender, non-distended MUSCULOSKELETAL:  No edema; No deformity  SKIN: Warm and dry LOWER EXTREMITIES: no swelling NEUROLOGIC:  Alert and oriented x 3 PSYCHIATRIC:  Normal affect   ASSESSMENT:    1. Persistent atrial fibrillation (HCC)   2. Paroxysmal atrial fibrillation (HCC)   3. OSA on CPAP   4. Hypercoagulable state due to persistent atrial fibrillation (HCC)   5. Cardiac pacemaker in situ   6. Mixed hyperlipidemia    PLAN:    In order of problems listed above:  Atrial fibrillation he is in sinus rhythm today I elected to continue 200 mg twice amiodarone  for another 2 to 3 weeks we will bring him back for EKG if EKG shows sinus rhythm will reduce the dose to only 200 mg daily. Obstructive sleep apnea follow up internal medicine team. History of cardiomyopathy now ejection fraction normalized.  He is on medications that he is able to tolerate the difficulties his blood pressure being low. Dyslipidemia did review K PN LDL 53 HDL 55 this is from September 12, 2023 we will continue present management   Medication Adjustments/Labs and Tests Ordered: Current medicines are reviewed at length with the patient today.  Concerns regarding medicines are outlined above.  Orders Placed This Encounter  Procedures   EKG 12-Lead   Medication changes: No orders of the defined types were placed in this encounter.   Signed, Manfred Seed, MD, [facility] 10/11/2023 10:23 AM    [facility]

## 2023-10-11 NOTE — Patient Instructions (Addendum)
 Medication Instructions:   Continue taking: Amiodarone  200mg  1 tablet twice daily   Other Instructions:  Nurse Visit in 2 1/2 weeks- IF EKG normal will decrease Amiodarone  to 1 tablet daily  Referral to Neurology- They will call for appt     Lab Work: None Ordered If you have labs (blood work) drawn today and your tests are completely normal, you will receive your results only by: MyChart Message (if you have MyChart) OR A paper copy in the mail If you have any lab test that is abnormal or we need to change your treatment, we will call you to review the results.   Testing/Procedures: None Ordered   Follow-Up: At [facility], you and your health needs are our priority.  As part of our continuing mission to provide you with exceptional heart care, we have created designated Provider Care Teams.  These Care Teams include your primary Cardiologist (physician) and Advanced Practice Providers (APPs -  Physician Assistants and Nurse Practitioners) who all work together to provide you with the care you need, when you need it.  We recommend signing up for the patient portal called "MyChart".  Sign up information is provided on this After Visit Summary.  MyChart is used to connect with patients for Virtual Visits (Telemedicine).  Patients are able to view lab/test results, encounter notes, upcoming appointments, etc.  Non-urgent messages can be sent to your provider as well.   To learn more about what you can do with MyChart, go to [email or web address].    Your next appointment:   3 month(s)  The format for your next appointment:   In Person  Provider:   Ralene Burger, MD

## 2023-10-22 ENCOUNTER — Other Ambulatory Visit: Payer: Self-pay | Admitting: Family Medicine

## 2023-10-22 NOTE — Telephone Encounter (Signed)
 Copied from CRM [id number]. Topic: Clinical - Medication Refill >> Oct 22, 2023  3:19 PM Tiffany S wrote: Most Recent Primary Care Visit:  Provider: [facility]  Department: [facility]  Visit Type: OFFICE VISIT  Date: 09/12/2023  Medication: Mounjaro   Has the patient contacted their pharmacy? Yes (Agent: If no, request that the patient contact the pharmacy for the refill. If patient does not wish to contact the pharmacy document the reason why and proceed with request.) (Agent: If yes, when and what did the pharmacy advise?)  Is this the correct pharmacy for this prescription? Yes If no, delete pharmacy and type the correct one.  This is the patient's preferred pharmacy:  [facility] - [place], [place] - [address] [place] [address] Phone: [phone] Fax: [phone]   Has the prescription been filled recently? Yes  Is the patient out of the medication? Yes  Has the patient been seen for an appointment in the last year OR does the patient have an upcoming appointment? Yes  Can we respond through MyChart? Yes  Agent: Please be advised that Rx refills may take up to 3 business days. We ask that you follow-up with your pharmacy.

## 2023-10-23 ENCOUNTER — Other Ambulatory Visit: Payer: Self-pay | Admitting: Cardiology

## 2023-10-23 ENCOUNTER — Other Ambulatory Visit: Payer: Self-pay | Admitting: Family Medicine

## 2023-10-23 NOTE — Telephone Encounter (Signed)
 Copied from CRM [id number]. Topic: Clinical - Medication Refill >> Oct 23, 2023 10:50 AM Carlatta H wrote: Most Recent Primary Care Visit:  Provider: [facility]  [facility]: [facility]  Visit Type: OFFICE VISIT  Date: 09/12/2023  Medication: Mounjaro  10mg   Has the patient contacted their pharmacy? No (Agent: If no, request that the patient contact the pharmacy for the refill. If patient does not wish to contact the pharmacy document the reason why and proceed with request.) (Agent: If yes, when and what did the pharmacy advise?)  Is this the correct pharmacy for this prescription? Yes If no, delete pharmacy and type the correct one.  This is the patient's preferred pharmacy:  [facility] - [place], [place] - [address] [place] [address] Phone: [phone] Fax: [phone]   Has the prescription been filled recently? No  Is the patient out of the medication? Yes  Has the patient been seen for an appointment in the last year OR does the patient have an upcoming appointment? Yes  Can we respond through MyChart? No  Agent: Please be advised that Rx refills may take up to 3 business days. We ask that you follow-up with your pharmacy.

## 2023-10-23 NOTE — Telephone Encounter (Signed)
 Pt last saw Dr Gordan Latina 10/11/23, last labs 09/12/23 Creat 1.70, age 83, weight 109.7kg, CrCl 50.19.  Serum Creat fluctuates frequently, given CrCl is right at dose appropriate range 15-50 for Xarelto  15mg  every day for afib, will refill current rx.

## 2023-10-24 MED ORDER — MOUNJARO 10 MG/0.5ML ~~LOC~~ SOAJ
10.0000 mg | SUBCUTANEOUS | 0 refills | Status: DC
Start: 2023-10-24 — End: 2023-12-19

## 2023-10-24 NOTE — Progress Notes (Signed)
 Remote pacemaker transmission.

## 2023-10-30 ENCOUNTER — Ambulatory Visit: Payer: Self-pay

## 2023-10-30 ENCOUNTER — Ambulatory Visit

## 2023-10-30 VITALS — BP 110/72 | HR 60 | Ht 75.0 in | Wt 243.0 lb

## 2023-10-30 DIAGNOSIS — I4819 Other persistent atrial fibrillation: Secondary | ICD-10-CM | POA: Insufficient documentation

## 2023-10-30 NOTE — Progress Notes (Signed)
   Nurse Visit   Date of Encounter: 10/30/2023 ID: Paul Kent, DOB September 11, 1940, MRN [id number]  PCP:  Mercy Stall, MD   [facility] Providers Cardiologist:  Ralene Burger, MD Electrophysiologist:  Lei Pump, MD      Visit Details   VS:  BP 110/72   Pulse 60   Ht 6\' 3"  (1.905 m)   Wt 243 lb (110.2 kg)   SpO2 98%   BMI 30.37 kg/m  , BMI Body mass index is 30.37 kg/m.  Wt Readings from Last 3 Encounters:  10/30/23 243 lb (110.2 kg)  10/11/23 241 lb 12.8 oz (109.7 kg)  09/12/23 248 lb (112.5 kg)     Reason for visit: EKG Performed today: Vitals, EKG, Provider consulted:Krasowski, and Education Changes (medications, testing, etc.) : None per Dr. Ronell Coe as pt is in SR Length of Visit: 15 minutes    Medications Adjustments/Labs and Tests Ordered: Orders Placed This Encounter  Procedures   EKG 12-Lead   No orders of the defined types were placed in this encounter.    Signed, Einar Grave, RN  10/30/2023 10:53 AM   Addendum: 10-30-2023: Nurse visit for EKG, reviewed EKG shows AV dual paced rhythm.  Plan as discussed and documented by Audelia Leaks above.  I have not personally seen the patient myself today. Sreedhar reddy Madireddy MD

## 2023-11-01 NOTE — Progress Notes (Signed)
 Remote pacemaker transmission.

## 2023-11-08 ENCOUNTER — Other Ambulatory Visit: Payer: Self-pay | Admitting: Family Medicine

## 2023-11-15 NOTE — Progress Notes (Signed)
 Remote pacemaker transmission.

## 2023-11-19 ENCOUNTER — Ambulatory Visit: Payer: Self-pay

## 2023-11-19 NOTE — Telephone Encounter (Signed)
 Copied from CRM [id number]. Topic: Clinical - Red Word Triage >> Nov 19, 2023  1:41 PM Paul Kent wrote: Red Word that prompted transfer to Nurse Triage: Patient calling about heart rate its 116   Chief Complaint: Fast heart rate Symptoms: HR 114-116 Frequency: persisttent Pertinent Negatives: Patient denies chest pain Disposition: [] ED /[] Urgent Care (no appt availability in office) / [] Appointment(In office/virtual)/ []  [place] [facility]/ [] Home Care/ [] Refused Recommended Disposition /[] [facility]/ [x]  Follow-up with PCP Additional Notes: Felt shaky today and wasn't feeling well. Felt dizzy last week. Checked HR tpday after the gym and saw that his HR was 114-116, BP was 103/72. Call made to [facility] per protocol, spoke with Lauren who advises patient to f/u with cardiologist Dr. Gordan Latina since he is managing patents afib. Pt verbalized understanding and says he will call now.   Reason for Disposition  [1] Caller has URGENT question AND [2] triager unable to answer question  Answer Assessment - Initial Assessment Questions 1. DESCRIPTION: "Please describe your heart rate or heartbeat that you are having" (e.g., fast/slow, regular/irregular, skipped or extra beats, "palpitations")     Fast heart rate 116  2. ONSET: "When did it start?" (Minutes, hours or days)      Noticed 2 hours ago.   3. DURATION: "How long does it last" (e.g., seconds, minutes, hours)     Seems to be persisting  4. PATTERN "Does it come and go, or has it been constant since it started?"  "Does it get worse with exertion?"   "Are you feeling it now?"     Persistent  5. TAP: "Using your hand, can you tap out what you are feeling on a chair or table in front of you, so that I can hear?" (Note: not all patients can do this)       no 6. HEART RATE: "Can you tell me your heart rate?" "How many beats in 15 seconds?"  (Note: not all patients can do this)       114-116  7. RECURRENT SYMPTOM: "Have you ever had  this before?" If Yes, ask: "When was the last time?" and "What happened that time?"     Yes, has happened before. Has history of afib and has a pacemaker  8. CAUSE: "What do you think is causing the palpitations?"     Unsure  9. CARDIAC HISTORY: "Do you have any history of heart disease?" (e.g., heart attack, angina, bypass surgery, angioplasty, arrhythmia)      Pacemaker  10. OTHER SYMPTOMS: "Do you have any other symptoms?" (e.g., dizziness, chest pain, sweating, difficulty breathing)       No other symptoms  Protocols used: Heart Rate and Heartbeat Questions-A-AH, ICD and Pacemaker Symptoms and Questions-A-AH

## 2023-11-20 ENCOUNTER — Ambulatory Visit: Attending: Cardiology

## 2023-11-20 ENCOUNTER — Telehealth: Payer: Self-pay | Admitting: Cardiology

## 2023-11-20 ENCOUNTER — Telehealth: Payer: Self-pay

## 2023-11-20 VITALS — BP 108/78 | HR 115 | Resp 18 | Ht 75.0 in | Wt 240.0 lb

## 2023-11-20 DIAGNOSIS — Z8551 Personal history of malignant neoplasm of bladder: Secondary | ICD-10-CM | POA: Diagnosis not present

## 2023-11-20 DIAGNOSIS — I4819 Other persistent atrial fibrillation: Secondary | ICD-10-CM

## 2023-11-20 DIAGNOSIS — R8289 Other abnormal findings on cytological and histological examination of urine: Secondary | ICD-10-CM | POA: Diagnosis not present

## 2023-11-20 DIAGNOSIS — N401 Enlarged prostate with lower urinary tract symptoms: Secondary | ICD-10-CM | POA: Diagnosis not present

## 2023-11-20 NOTE — Telephone Encounter (Signed)
 Pt states his heart rate went from 60 to 115 bpm. I let him speak with Leigh, rn.

## 2023-11-20 NOTE — Progress Notes (Signed)
   Nurse Visit   Date of Encounter: 11/20/2023 ID: JONAVIN SEDER, DOB 06-09-1941, MRN [id number]  PCP:  Mercy Stall, MD   [facility] Providers Cardiologist:  Ralene Burger, MD Electrophysiologist:  Lei Pump, MD      Visit Details   VS:  There were no vitals taken for this visit. , BMI There is no height or weight on file to calculate BMI.  Wt Readings from Last 3 Encounters:  10/30/23 243 lb (110.2 kg)  10/11/23 241 lb 12.8 oz (109.7 kg)  09/12/23 248 lb (112.5 kg)     Reason for visit: EKG for rapid heart rate Performed today: Vitals, EKG, Provider consulted:Krasowski, and Education Changes (medications, testing, etc.) : Increase Amiodarone  to 200 mg BID and return in 1 week for repeat EKG. Message sent to [facility] regarding same. Length of Visit: 20 minutes    Medications Adjustments/Labs and Tests Ordered: Orders Placed This Encounter  Procedures   EKG 12-Lead   No orders of the defined types were placed in this encounter.    Signed, Einar Grave, RN  11/20/2023 11:56 AM

## 2023-11-20 NOTE — Telephone Encounter (Signed)
 Patient has Biotronik monitor (unable to receive manual transmission.) Patient is currently asymptomatic and has to leave for apt at nephrology office. Patient will recheck heart rate this afternoon and call back w/ update. See other phone encounter from this morning.

## 2023-11-20 NOTE — Telephone Encounter (Signed)
 Nurse visit for EKG scheduled. Pt states that his heart rate has been 115 for 2-3 days and he has taken an extra dose of amiodarone  as prescribed without changes.

## 2023-11-20 NOTE — Telephone Encounter (Signed)
   Patient with history of paroxysmal atrial fibrillation reports noticing HR of around 115 bpm on his BP cuff over the last couple of days. He denies having chest pain or shortness of breath but admits to feeling a little "off." He has continued to take Amiodarone  once daily but did take an extra dose yesterday, reports he was advised to "take another one if I felt like I was in afib." Given no severe symptoms at this time, will inform primary cardiologist/clinic staff. Suspect patient simply needs follow up office visit and/or medication titration. Reviewed emergency precautions with patient.  Leala Prince, PA-C

## 2023-11-20 NOTE — Telephone Encounter (Signed)
 Biotronik remote monitor notes increase in AF burden. Unfortunately, no EGM's are available.

## 2023-11-20 NOTE — Telephone Encounter (Signed)
 Dr. Gordan Latina wanted to make you aware this pt is in afib again. We done an EKG today and increased his Amiodarone  to 200 mg BID, return 1 week for repeat EKG. Dr. Krasowski also felt you might want them to look and see when the afib started on his device.

## 2023-11-22 ENCOUNTER — Telehealth: Payer: Self-pay | Admitting: Cardiology

## 2023-11-22 MED ORDER — AMIODARONE HCL 200 MG PO TABS: 200.0000 mg | ORAL_TABLET | Freq: Two times a day (BID) | ORAL | 2 refills | Status: DC

## 2023-11-22 NOTE — Telephone Encounter (Signed)
RX sent and pt aware.  

## 2023-11-22 NOTE — Telephone Encounter (Signed)
 Patient says Dr Linnell Richardson told him to take an extra tablet of Amiodarone  - he needs a new prescription sent to [facility] to reflect the change-  Please call patient when complete

## 2023-11-23 ENCOUNTER — Other Ambulatory Visit: Payer: Self-pay | Admitting: Family Medicine

## 2023-11-23 DIAGNOSIS — E1142 Type 2 diabetes mellitus with diabetic polyneuropathy: Secondary | ICD-10-CM

## 2023-11-23 NOTE — Telephone Encounter (Signed)
Left message to call back to discuss further  

## 2023-11-27 ENCOUNTER — Ambulatory Visit

## 2023-11-27 ENCOUNTER — Other Ambulatory Visit: Payer: Self-pay

## 2023-11-27 VITALS — BP 110/58 | HR 60 | Resp 20 | Wt 243.0 lb

## 2023-11-27 DIAGNOSIS — I4819 Other persistent atrial fibrillation: Secondary | ICD-10-CM | POA: Diagnosis not present

## 2023-11-27 MED ORDER — AMIODARONE HCL 200 MG PO TABS: 200.0000 mg | ORAL_TABLET | Freq: Every day | ORAL | 3 refills | Status: DC

## 2023-11-27 NOTE — Progress Notes (Signed)
   Nurse Visit   Date of Encounter: 11/27/2023 ID: KREW HORTMAN, DOB [birth year], MRN [id number]  PCP:  Mercy Stall, MD   [facility] Providers Cardiologist:  Ralene Burger, MD Electrophysiologist:  Lei Pump, MD      Visit Details   VS:  BP (!) 110/58 (BP Location: Left Arm, Patient Position: Sitting, Cuff Size: Normal)   Pulse 60   Resp 20   Wt 243 lb (110.2 kg)   SpO2 98%   BMI 30.37 kg/m  , BMI Body mass index is 30.37 kg/m.  Wt Readings from Last 3 Encounters:  11/27/23 243 lb (110.2 kg)  11/20/23 240 lb (108.9 kg)  10/30/23 243 lb (110.2 kg)     Reason for visit: Perform an EKG Performed today: Vitals, EKG, Education and Provider consulted Changes (medications, testing, etc.) : Take Amiodarone  200 mg twice daily for 1 week, then switch to daily Length of Visit: 25 minutes    Medications Adjustments/Labs and Tests Ordered: Orders Placed This Encounter  Procedures   EKG 12-Lead   No orders of the defined types were placed in this encounter.    Signed, Dondra Fuel, RN  11/27/2023 2:51 PM

## 2023-11-27 NOTE — Telephone Encounter (Signed)
 Left message to call back

## 2023-11-27 NOTE — Telephone Encounter (Signed)
 Patient returned RN Sherri's call.

## 2023-11-28 ENCOUNTER — Telehealth: Payer: Self-pay | Admitting: Cardiology

## 2023-11-28 NOTE — Telephone Encounter (Signed)
 Patient stated he was returning RN Sherri's call.

## 2023-11-28 NOTE — Telephone Encounter (Signed)
 Camnitz, Paul Gaylyn Keas, MD  You7 days ago    I think repeat ablation with PFA is likely his best option to maintain sinus rhythm at this point.    Glorianne Largo, RN  You; [place], Paul Kent, MD8 days ago    Biotronik remote monitor notes increase in AF burden. Unfortunately, no EGM's are available.

## 2023-11-28 NOTE — Telephone Encounter (Signed)
 Left message to call back

## 2023-12-05 NOTE — Telephone Encounter (Signed)
 Patient returned RN Sherri's call.

## 2023-12-05 NOTE — Telephone Encounter (Signed)
 Pt returned my call.  States he is NOT having another ablation and feels fine.  Says he is working out/walking 3-5 times/week. Forwarding to Dr. Lawana Pray for his FYI and if other advisement needed.

## 2023-12-12 ENCOUNTER — Other Ambulatory Visit: Payer: Self-pay | Admitting: Family Medicine

## 2023-12-12 DIAGNOSIS — E1142 Type 2 diabetes mellitus with diabetic polyneuropathy: Secondary | ICD-10-CM

## 2023-12-12 DIAGNOSIS — K219 Gastro-esophageal reflux disease without esophagitis: Secondary | ICD-10-CM

## 2023-12-14 ENCOUNTER — Ambulatory Visit (INDEPENDENT_AMBULATORY_CARE_PROVIDER_SITE_OTHER): Payer: Medicare Other

## 2023-12-14 DIAGNOSIS — I495 Sick sinus syndrome: Secondary | ICD-10-CM

## 2023-12-14 LAB — CUP PACEART REMOTE DEVICE CHECK
Battery Voltage: 35
Date Time Interrogation Session: 20250627085146
Implantable Lead Connection Status: 753985
Implantable Lead Connection Status: 753985
Implantable Lead Implant Date: 20180813
Implantable Lead Implant Date: 20180813
Implantable Lead Location: 753859
Implantable Lead Location: 753860
Implantable Lead Model: 377
Implantable Lead Model: 377
Implantable Lead Serial Number: 49893169
Implantable Lead Serial Number: 50011411
Implantable Pulse Generator Implant Date: 20180813
Pulse Gen Model: 407145
Pulse Gen Serial Number: 69158272

## 2023-12-16 ENCOUNTER — Ambulatory Visit: Payer: Self-pay | Admitting: Cardiology

## 2023-12-18 ENCOUNTER — Ambulatory Visit (INDEPENDENT_AMBULATORY_CARE_PROVIDER_SITE_OTHER): Admitting: Family Medicine

## 2023-12-18 ENCOUNTER — Encounter: Payer: Self-pay | Admitting: Family Medicine

## 2023-12-18 VITALS — BP 110/70 | HR 58 | Temp 98.0°F | Ht 75.0 in | Wt 248.0 lb

## 2023-12-18 DIAGNOSIS — E1142 Type 2 diabetes mellitus with diabetic polyneuropathy: Secondary | ICD-10-CM

## 2023-12-18 DIAGNOSIS — G245 Blepharospasm: Secondary | ICD-10-CM

## 2023-12-18 DIAGNOSIS — N1832 Chronic kidney disease, stage 3b: Secondary | ICD-10-CM

## 2023-12-18 DIAGNOSIS — E782 Mixed hyperlipidemia: Secondary | ICD-10-CM | POA: Diagnosis not present

## 2023-12-18 DIAGNOSIS — R413 Other amnesia: Secondary | ICD-10-CM

## 2023-12-18 DIAGNOSIS — I130 Hypertensive heart and chronic kidney disease with heart failure and stage 1 through stage 4 chronic kidney disease, or unspecified chronic kidney disease: Secondary | ICD-10-CM

## 2023-12-18 DIAGNOSIS — I4819 Other persistent atrial fibrillation: Secondary | ICD-10-CM

## 2023-12-18 DIAGNOSIS — E039 Hypothyroidism, unspecified: Secondary | ICD-10-CM

## 2023-12-18 NOTE — Progress Notes (Unsigned)
 Subjective:  Patient ID: Paul Kent, male    DOB: 1941/06/12  Age: 83 y.o. MRN: [id number]  Chief Complaint  Patient presents with   Medical Management of Chronic Issues    HPI: Discussed the use of AI scribe software for clinical note transcription with the patient, who gave verbal consent to proceed.  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  Ocular blepharospasm and visual symptoms - Persistent involuntary eye blinking for approximately one year - Symptoms are particularly bothersome while watching television - Symptoms worsen in sunlight; wears sunglasses outdoors for mitigation - Blepharoplasty performed 3-4 months ago improved vision but did not alleviate blinking - Two prior Botox injections provided no significant relief - Upcoming neurology appointment scheduled for September  Diabetes mellitus management - Blood glucose managed with glipizide ; out of Mounjaro  for the past couple of weeks - Blood glucose readings range from 105 to 180 mg/dL - Monitors blood glucose once or twice daily - Previously used a continuous glucose monitor, which was helpful, but insurance does not cover unless on insulin twice daily  Peripheral neuropathy and lower extremity skin integrity - Experiences foot issues including bleeding from toenails digging into the skin; uses Band-Aids for protection - History of wound care for the other foot, which has since healed - Applies lotion to legs; legs are shiny with hair loss  Atrial fibrillation and associated symptoms - History of atrial fibrillation; takes amiodarone  and Xarelto  - Able to recognize when in atrial fibrillation by feeling extremely worn out and sometimes dizzy - Takes an extra dose of amiodarone  when symptomatic - No chest pain or breathing problems - Dizziness or lightheadedness occurs during atrial  fibrillation episodes  Weight management and lifestyle - Motivated to lose weight; eats relatively healthy with occasional indulgence in foods such as peach cobbler - Exercises at the gym 3-4 times per week, using the bike and other machines for 35-40 minutes per session     Hyperlipidemia: Current medications: Atorvastatin  10 mg daily.   CKD 3B: stable.   GERD: Famotidine  40 mg daily.    Hypothyroidism: He takes Levothyroxine  137 mcg every morning.      06/07/2023   10:49 AM 03/15/2023    9:56 AM 02/06/2023   10:53 AM 10/26/2022    2:35 PM 02/13/2022    9:10 AM  Depression screen PHQ 2/9  Decreased Interest 0 0 0 0 0  Down, Depressed, Hopeless 0 0 0 0 0  PHQ - 2 Score 0 0 0 0 0  Altered sleeping  0  0   Tired, decreased energy  0  0   Change in appetite  0  0   Feeling bad or failure about yourself   0  0   Trouble concentrating  0  0   Moving slowly or fidgety/restless  0  0   Suicidal thoughts  0  0   PHQ-9 Score  0  0   Difficult doing work/chores  Not difficult at all  Not difficult at all         06/07/2023   10:49 AM  Fall Risk   Falls in the past year? 1  Number falls in past yr: 0  Injury with Fall? 1  Risk for fall due to : Impaired balance/gait;History of fall(s)  Follow up Falls evaluation completed;Falls prevention discussed    Patient Care Team:  CoxAbigail, MD as PCP - General (Internal Medicine) Inocencio Soyla Lunger, MD as PCP - Electrophysiology (Cardiology) Bernie Lamar PARAS, MD as PCP - Cardiology (Cardiology) Marda General, MD as Consulting Physician (Urology) Erica Double, MD (Orthopedic Surgery) Kendell Marcey ORN, MD as Referring Physician (Family Medicine) Maudie Nest, OD (Optometry) Teresa Purchase, MD as Referring Physician (Ophthalmology)   Review of Systems  Constitutional:  Negative for chills, diaphoresis, fatigue and fever.  HENT:  Negative for congestion, ear pain and sore throat.   Respiratory:  Negative for cough and  shortness of breath.   Cardiovascular:  Negative for chest pain and leg swelling.  Gastrointestinal:  Negative for abdominal pain, constipation, diarrhea, nausea and vomiting.  Genitourinary:  Negative for dysuria and urgency.  Musculoskeletal:  Negative for arthralgias and myalgias.  Neurological:  Negative for dizziness and headaches.  Psychiatric/Behavioral:  Negative for dysphoric mood.     Current Outpatient Medications on File Prior to Visit  Medication Sig Dispense Refill   amiodarone  (PACERONE ) 200 MG tablet Take 1 tablet (200 mg total) by mouth daily. Take this medication twice daily for 1 week then switch to daily. 90 tablet 3   atorvastatin  (LIPITOR) 10 MG tablet TAKE ONE TABLET BY MOUTH EVERY EVENING 90 tablet 0   gabapentin (NEURONTIN) 300 MG capsule Take 1 capsule (300 mg total) by mouth at bedtime. 90 capsule 1   levothyroxine  (SYNTHROID ) 137 MCG tablet TAKE ONE TABLET BY MOUTH EVERY MORNING 90 tablet 0   primidone  (MYSOLINE ) 50 MG tablet Take 1 tablet (50 mg total) by mouth 2 (two) times daily. 180 tablet 1   tamsulosin  (FLOMAX ) 0.4 MG CAPS capsule TAKE ONE CAPSULE BY MOUTH EVERY EVENING 30 capsule 1   XARELTO  15 MG TABS tablet TAKE ONE TABLET BY MOUTH EVERY EVENING 30 tablet 5   No current facility-administered medications on file prior to visit.   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) 04/26/2021   Corns and callosities    Deviated septum 07/01/2018   Deviated septum 07/01/2018   Diabetes mellitus due to underlying condition with unspecified complications  (HCC) 06/08/2016   Diabetic ulcer of right heel associated with diabetes mellitus due to underlying condition, limited to breakdown of skin (HCC) 07/31/2021   Disturbances of salivary secretion    Epistaxis    Essential hypertension    Essential tremor    H pylori ulcer    H pylori ulcer    Herpes zoster with nervous system complication 06/03/2013   Hesitancy of micturition    Hyperlipidemia    Hypothyroidism    Impacted cerumen, bilateral    Jaw pain    Lesion of femoral nerve 04/30/2013   Localized edema    Male erectile disorder    Malignant 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)    Nasal congestion 07/01/2018   Nasal turbinate hypertrophy 07/01/2018   Obstructive sleep apnea 01/17/2017   Other constipation    Other fatigue    Overweight    Pacemaker reprogramming/check 02/12/2017  Pain in right finger(s)    Pain in right lower leg    Paroxysmal atrial fibrillation (HCC) 06/08/2016   Peptic ulcer disease    Persistent atrial fibrillation (HCC) 06/01/2017   Primary insomnia    Renal stones    Renal stones    Secondary hypercoagulable state (HCC) 06/17/2020   Shingles 2014   WITH COMPLICATIONS (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)   Shingles 06/2012   Shortness of breath    Sick sinus syndrome (HCC)    Sinus bradycardia 03/07/2017   Sleep apnea    dx 08/2015, CPAP   Spontaneous ecchymoses    Status post ablation of atrial fibrillation 09/16/2020   Testicular hypofunction    Thoracic or lumbosacral neuritis or radiculitis 06/03/2013   Trigger middle finger of right hand 11/28/2019   Trigger middle finger of right hand 11/28/2019   Type 2 diabetes mellitus with circulatory disorder, 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   Past Surgical History:  Procedure Laterality Date   ABLATION  06/2017   ATRIAL FIBRILLATION ABLATION N/A 05/21/2020   Procedure: ATRIAL FIBRILLATION ABLATION;  Surgeon: Inocencio Soyla Lunger, MD;   Location: [facility] INVASIVE CV LAB;  Service: Cardiovascular;  Laterality: N/A;   CARDIOVERSION N/A 03/22/2018   Procedure: CARDIOVERSION;  Surgeon: Lonni Slain, MD;  Location: [facility] ENDOSCOPY;  Service: Cardiovascular;  Laterality: N/A;   CARDIOVERSION N/A 07/08/2020   Procedure: CARDIOVERSION;  Surgeon: Jeffrie Oneil BROCKS, MD;  Location: [facility] ENDOSCOPY;  Service: Cardiovascular;  Laterality: N/A;   HEMIARTHROPLASTY HIP  10/31/2013   IT HIP BIPOLAR   LUMBAR SPINE SURGERY     PACEMAKER PLACEMENT  01/29/2017   SHOULDER SURGERY Right     Family History  Problem Relation Age of Onset   Heart attack Mother    Transient ischemic attack Mother    Heart Problems Father    Tuberculosis Brother    Diabetes type II Other    Hyperlipidemia Other    Congestive Heart Failure Other    Arthritis Other    Social History   Socioeconomic History   Marital status: Married    Spouse name: Ginnie   Number of children: 3   Years of education: Not on file   Highest education level: Not on file  Occupational History   Occupation: RETIRED    Comment: school principal  Tobacco Use   Smoking status: Never   Smokeless tobacco: Never  Vaping Use   Vaping status: Never Used  Substance and Sexual Activity   Alcohol use: Not Currently   Drug use: Never   Sexual activity: Not on file  Other Topics Concern   Not on file  Social History Narrative   Lives with wife   Social Drivers of Health   Financial Resource Strain: Low Risk  (05/30/2023)   Overall Financial Resource Strain (CARDIA)    Difficulty of Paying Living Expenses: Not hard at all  Food Insecurity: No Food Insecurity (09/12/2023)   Hunger Vital Sign    Worried About Running Out of Food in the Last Year: Never true    Ran Out of Food in the Last Year: Never true  Transportation Needs: No Transportation Needs (09/12/2023)   PRAPARE - Administrator, Civil Service (Medical): No    Lack of Transportation (Non-Medical): No  Physical  Activity: Sufficiently Active (05/30/2023)   Exercise Vital Sign    Days of Exercise per Week: 4 days    Minutes of Exercise per Session: 60  min  Stress: No Stress Concern Present (05/30/2023)   [employer] of Occupational Health - Occupational Stress Questionnaire    Feeling of Stress : Not at all  Social Connections: Socially Integrated (05/30/2023)   Social Connection and Isolation Panel    Frequency of Communication with Friends and Family: More than three times a week    Frequency of Social Gatherings with Friends and Family: More than three times a week    Attends Religious Services: More than 4 times per year    Active Member of [employer] or Organizations: Yes    Attends Engineer, structural: More than 4 times per year    Marital Status: Married    Objective:  BP 110/70   Pulse (!) 58   Temp 98 F (36.7 C)   Ht 6' 3 (1.905 m)   Wt 248 lb (112.5 kg)   SpO2 98%   BMI 31.00 kg/m      12/18/2023    8:22 AM 11/27/2023    1:47 PM 11/20/2023    1:29 PM  BP/Weight  Systolic BP 110 110 108  Diastolic BP 70 58 78  Wt. (Lbs) 248 243 240  BMI 31 kg/m2 30.37 kg/m2 30 kg/m2    Physical Exam Vitals reviewed.  Constitutional:      Appearance: Normal appearance. He is obese.  Eyes:     Comments: Blepharospasm BL.  Neck:     Vascular: No carotid bruit.  Cardiovascular:     Rate and Rhythm: Normal rate. Rhythm irregular.     Pulses: Normal pulses.     Heart sounds: Normal heart sounds.  Pulmonary:     Effort: Pulmonary effort is normal.     Breath sounds: Normal breath sounds. No wheezing, rhonchi or rales.  Abdominal:     General: Bowel sounds are normal.     Palpations: Abdomen is soft.     Tenderness: There is no abdominal tenderness.  Neurological:     Mental Status: He is alert and oriented to person, place, and time.  Psychiatric:        Mood and Affect: Mood normal.        Behavior: Behavior normal.      MMSE 28/30      Diabetic foot exam was  performed with the following findings:   Normal sensation of 10g monofilament Intact posterior tibialis and dorsalis pedis pulses Claw toes. Tight skin wiithout hair lon lower legs and feet.       Lab Results  Component Value Date   WBC 4.4 12/18/2023   HGB 11.3 (L) 12/18/2023   HCT 35.3 (L) 12/18/2023   PLT 141 (L) 12/18/2023   GLUCOSE 111 (H) 12/18/2023   CHOL 117 12/18/2023   TRIG 71 12/18/2023   HDL 50 12/18/2023   LDLCALC 52 12/18/2023   ALT 16 12/18/2023   AST 20 12/18/2023   NA 142 12/18/2023   K 5.4 (H) 12/18/2023   CL 107 (H) 12/18/2023   CREATININE 1.81 (H) 12/18/2023   BUN 27 12/18/2023   CO2 21 12/18/2023   TSH 4.010 05/30/2023   HGBA1C 6.4 (H) 12/18/2023   MICROALBUR 80 01/20/2021      Assessment & Plan:  Hypertensive heart and renal disease, stage 1-4 or unspecified chronic kidney disease, with heart failure (HCC) Assessment & Plan: Blood glucose managed with glipizide , ranging from 105 to 180 mg/dL. Interested in resuming Mounjaro , aware of hypoglycemia risk with glipizide  and Mounjaro .  - Provide Mounjaro  samples starting at 2.5  mg for two weeks. - Monitor blood glucose levels regularly. - Consider increasing Mounjaro  dose if tolerated. - Discuss over-the-counter continuous glucose monitor (Stelo).  Orders: -     Comprehensive metabolic panel with GFR -     CBC with Differential/Platelet  Acquired hypothyroidism Assessment & Plan: Managed with thyroid  medication. Plans to check thyroid  levels during this visit.   - Check thyroid  levels    Orders: -     TSH  Diabetic polyneuropathy associated with type 2 diabetes mellitus (HCC) -     Hemoglobin A1c  Mixed hyperlipidemia -     Lipid panel  Memory loss Assessment & Plan: Cognition testing is good.  Check labs.   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 Symptoms worsen with television and sunlight. Previous Botox ineffective. Awaiting neurology consultation. - Await neurology consultation in September. - Trial on baclofen 10 mg three times a day.  - Strongly recommended patient return to ophthalmology for botox.  Orders: -     Baclofen; Take 1 tablet (10 mg total) by mouth 3 (three) times daily.  Dispense: 90 each; Refill: 0  Stage 3b chronic kidney disease (HCC) Assessment & Plan: STABLE.  INTOLERANT TO SGL2.  CONSIDER ARB OR KERENDIA .    Persistent atrial fibrillation (HCC) Assessment & Plan: Managed with amiodarone  and Xarelto . No acute issues discussed.        Meds ordered this encounter  Medications   baclofen (LIORESAL) 10 MG tablet    Sig: Take 1 tablet (10 mg total) by mouth 3 (three) times daily.    Dispense:  90 each    Refill:  0    Orders Placed This Encounter  Procedures   Comprehensive metabolic panel with GFR   CBC with Differential/Platelet   Hemoglobin A1c   Lipid panel   B12 and Folate Panel   Methylmalonic acid, serum   TSH     Follow-up: Return in about 3 months (around 03/19/2024) for chronic follow up.  Total time spent on today's visit was 40 minutes, including both face-to-face time and nonface-to-face time personally spent on review of chart (labs and imaging), discussing labs and goals, discussing further work-up, treatment options, referrals to specialist if needed, reviewing outside records of pertinent, answering patient's questions, and coordinating care.  I,Marla I Leal-Borjas,acting as a scribe for Abigail Free, MD.,have documented all relevant documentation on the behalf of Abigail Free, MD,as directed by  Abigail Free, MD while in the presence of Abigail Free, MD.   An After Visit Summary was printed and given to the patient.  I attest that I have reviewed this visit and agree with the plan scribed by my staff.  SABRA Abigail Free, MD [facility] [phone]

## 2023-12-19 ENCOUNTER — Ambulatory Visit: Payer: Self-pay | Admitting: Family Medicine

## 2023-12-19 ENCOUNTER — Encounter: Payer: Self-pay | Admitting: Family Medicine

## 2023-12-19 LAB — COMPREHENSIVE METABOLIC PANEL WITH GFR
ALT: 16 IU/L (ref 0–44)
AST: 20 IU/L (ref 0–40)
Albumin: 4.1 g/dL (ref 3.7–4.7)
Alkaline Phosphatase: 122 IU/L — ABNORMAL HIGH (ref 44–121)
BUN/Creatinine Ratio: 15 (ref 10–24)
BUN: 27 mg/dL (ref 8–27)
Bilirubin Total: 0.4 mg/dL (ref 0.0–1.2)
CO2: 21 mmol/L (ref 20–29)
Calcium: 8.8 mg/dL (ref 8.6–10.2)
Chloride: 107 mmol/L — ABNORMAL HIGH (ref 96–106)
Creatinine, Ser: 1.81 mg/dL — ABNORMAL HIGH (ref 0.76–1.27)
Globulin, Total: 2.4 g/dL (ref 1.5–4.5)
Glucose: 111 mg/dL — ABNORMAL HIGH (ref 70–99)
Potassium: 5.4 mmol/L — ABNORMAL HIGH (ref 3.5–5.2)
Sodium: 142 mmol/L (ref 134–144)
Total Protein: 6.5 g/dL (ref 6.0–8.5)
eGFR: 36 mL/min/{1.73_m2} — ABNORMAL LOW (ref >59)

## 2023-12-19 LAB — LIPID PANEL
Chol/HDL Ratio: 2.3 ratio (ref 0.0–5.0)
Cholesterol, Total: 117 mg/dL (ref 100–199)
HDL: 50 mg/dL (ref >39)
LDL Chol Calc (NIH): 52 mg/dL (ref 0–99)
Triglycerides: 71 mg/dL (ref 0–149)
VLDL Cholesterol Cal: 15 mg/dL (ref 5–40)

## 2023-12-19 LAB — CBC WITH DIFFERENTIAL/PLATELET
Basophils Absolute: 0.1 10*3/uL (ref 0.0–0.2)
Basos: 2 %
EOS (ABSOLUTE): 0.1 10*3/uL (ref 0.0–0.4)
Eos: 3 %
Hematocrit: 35.3 % — ABNORMAL LOW (ref 37.5–51.0)
Hemoglobin: 11.3 g/dL — ABNORMAL LOW (ref 13.0–17.7)
Immature Grans (Abs): 0 10*3/uL (ref 0.0–0.1)
Immature Granulocytes: 0 %
Lymphocytes Absolute: 0.8 10*3/uL (ref 0.7–3.1)
Lymphs: 19 %
MCH: 32.5 pg (ref 26.6–33.0)
MCHC: 32 g/dL (ref 31.5–35.7)
MCV: 101 fL — ABNORMAL HIGH (ref 79–97)
Monocytes Absolute: 0.6 10*3/uL (ref 0.1–0.9)
Monocytes: 13 %
Neutrophils Absolute: 2.7 10*3/uL (ref 1.4–7.0)
Neutrophils: 63 %
Platelets: 141 10*3/uL — ABNORMAL LOW (ref 150–450)
RBC: 3.48 x10E6/uL — ABNORMAL LOW (ref 4.14–5.80)
RDW: 13.1 % (ref 11.6–15.4)
WBC: 4.4 10*3/uL (ref 3.4–10.8)

## 2023-12-19 LAB — HEMOGLOBIN A1C
Est. average glucose Bld gHb Est-mCnc: 137 mg/dL
Hgb A1c MFr Bld: 6.4 % — ABNORMAL HIGH (ref 4.8–5.6)

## 2023-12-19 MED ORDER — TIRZEPATIDE 5 MG/0.5ML ~~LOC~~ SOAJ: 5.0000 mg | SUBCUTANEOUS | 0 refills | Status: DC

## 2023-12-19 MED ORDER — BACLOFEN 10 MG PO TABS: 10.0000 mg | ORAL_TABLET | Freq: Three times a day (TID) | ORAL | 0 refills | Status: DC

## 2023-12-20 ENCOUNTER — Telehealth: Payer: Self-pay

## 2023-12-20 DIAGNOSIS — R413 Other amnesia: Secondary | ICD-10-CM | POA: Insufficient documentation

## 2023-12-20 NOTE — Assessment & Plan Note (Addendum)
 Blood glucose managed with glipizide , ranging from 105 to 180 mg/dL. Interested in resuming Mounjaro , aware of hypoglycemia risk with glipizide  and Mounjaro .  - Provide Mounjaro  samples starting at 2.5 mg for two weeks. - Monitor blood glucose levels regularly. - Consider increasing Mounjaro  dose if tolerated. - Discuss over-the-counter continuous glucose monitor (Stelo).

## 2023-12-20 NOTE — Assessment & Plan Note (Signed)
 Managed with thyroid medication. Plans to check thyroid levels during this visit.   - Check thyroid levels

## 2023-12-20 NOTE — Telephone Encounter (Signed)
 Jon Birmingham, PA-C talked with patient's wife and she verbalized to understand.

## 2023-12-20 NOTE — Telephone Encounter (Signed)
 Left message for patient or wife Ginnie to call the office.

## 2023-12-20 NOTE — Assessment & Plan Note (Signed)
 Cognition testing is good.  Check labs.

## 2023-12-20 NOTE — Telephone Encounter (Signed)
 Spoke with patient's wife, Ginnie, She verbalized understanding.

## 2023-12-20 NOTE — Assessment & Plan Note (Signed)
 Managed with amiodarone and Xarelto. No acute issues discussed.

## 2023-12-20 NOTE — Assessment & Plan Note (Signed)
 Chronic involuntary blinking, possibly related to dry eyes or post-blepharoplasty. Symptoms worsen with television and sunlight. Previous Botox ineffective. Awaiting neurology consultation. - Await neurology consultation in September. - Trial on baclofen 10 mg three times a day.  - Strongly recommended patient return to ophthalmology for botox.

## 2023-12-20 NOTE — Assessment & Plan Note (Signed)
 STABLE.  INTOLERANT TO SGL2.  CONSIDER ARB OR KERENDIA .

## 2023-12-21 LAB — B12 AND FOLATE PANEL
Folate: 6.1 ng/mL (ref >3.0)
Vitamin B-12: 277 pg/mL (ref 232–1245)

## 2023-12-21 LAB — METHYLMALONIC ACID, SERUM: Methylmalonic Acid: 925 nmol/L — ABNORMAL HIGH (ref 0–378)

## 2023-12-21 LAB — TSH: TSH: 3.92 u[IU]/mL (ref 0.450–4.500)

## 2023-12-22 ENCOUNTER — Inpatient Hospital Stay (HOSPITAL_COMMUNITY)
Admission: EM | Admit: 2023-12-22 | Discharge: 2023-12-27 | DRG: 092 | Disposition: A | Discharge status: Skilled Nursing Facility | Attending: Internal Medicine | Admitting: Internal Medicine

## 2023-12-22 ENCOUNTER — Emergency Department (HOSPITAL_COMMUNITY)

## 2023-12-22 ENCOUNTER — Other Ambulatory Visit: Payer: Self-pay | Admitting: Family Medicine

## 2023-12-22 DIAGNOSIS — G928 Other toxic encephalopathy: Secondary | ICD-10-CM | POA: Diagnosis not present

## 2023-12-22 DIAGNOSIS — Z8551 Personal history of malignant neoplasm of bladder: Secondary | ICD-10-CM

## 2023-12-22 DIAGNOSIS — N400 Enlarged prostate without lower urinary tract symptoms: Secondary | ICD-10-CM | POA: Diagnosis not present

## 2023-12-22 DIAGNOSIS — E861 Hypovolemia: Secondary | ICD-10-CM | POA: Diagnosis present

## 2023-12-22 DIAGNOSIS — G4733 Obstructive sleep apnea (adult) (pediatric): Secondary | ICD-10-CM | POA: Diagnosis present

## 2023-12-22 DIAGNOSIS — E1169 Type 2 diabetes mellitus with other specified complication: Secondary | ICD-10-CM | POA: Diagnosis present

## 2023-12-22 DIAGNOSIS — Z7985 Long-term (current) use of injectable non-insulin antidiabetic drugs: Secondary | ICD-10-CM

## 2023-12-22 DIAGNOSIS — Z6831 Body mass index (BMI) 31.0-31.9, adult: Secondary | ICD-10-CM

## 2023-12-22 DIAGNOSIS — T428X5A Adverse effect of antiparkinsonism drugs and other central muscle-tone depressants, initial encounter: Secondary | ICD-10-CM | POA: Diagnosis present

## 2023-12-22 DIAGNOSIS — G9340 Encephalopathy, unspecified: Secondary | ICD-10-CM | POA: Diagnosis not present

## 2023-12-22 DIAGNOSIS — Z7901 Long term (current) use of anticoagulants: Secondary | ICD-10-CM | POA: Diagnosis not present

## 2023-12-22 DIAGNOSIS — H02403 Unspecified ptosis of bilateral eyelids: Secondary | ICD-10-CM | POA: Diagnosis not present

## 2023-12-22 DIAGNOSIS — Z7401 Bed confinement status: Secondary | ICD-10-CM | POA: Diagnosis not present

## 2023-12-22 DIAGNOSIS — D649 Anemia, unspecified: Secondary | ICD-10-CM | POA: Diagnosis not present

## 2023-12-22 DIAGNOSIS — G245 Blepharospasm: Secondary | ICD-10-CM | POA: Diagnosis present

## 2023-12-22 DIAGNOSIS — Z79890 Hormone replacement therapy (postmenopausal): Secondary | ICD-10-CM | POA: Diagnosis not present

## 2023-12-22 DIAGNOSIS — Z96642 Presence of left artificial hip joint: Secondary | ICD-10-CM | POA: Diagnosis present

## 2023-12-22 DIAGNOSIS — D539 Nutritional anemia, unspecified: Secondary | ICD-10-CM | POA: Diagnosis present

## 2023-12-22 DIAGNOSIS — I495 Sick sinus syndrome: Secondary | ICD-10-CM | POA: Diagnosis present

## 2023-12-22 DIAGNOSIS — Z79899 Other long term (current) drug therapy: Secondary | ICD-10-CM

## 2023-12-22 DIAGNOSIS — R4182 Altered mental status, unspecified: Secondary | ICD-10-CM | POA: Diagnosis not present

## 2023-12-22 DIAGNOSIS — R262 Difficulty in walking, not elsewhere classified: Secondary | ICD-10-CM | POA: Diagnosis not present

## 2023-12-22 DIAGNOSIS — Z66 Do not resuscitate: Secondary | ICD-10-CM | POA: Diagnosis present

## 2023-12-22 DIAGNOSIS — N1832 Chronic kidney disease, stage 3b: Secondary | ICD-10-CM | POA: Diagnosis not present

## 2023-12-22 DIAGNOSIS — D696 Thrombocytopenia, unspecified: Secondary | ICD-10-CM | POA: Diagnosis present

## 2023-12-22 DIAGNOSIS — E1142 Type 2 diabetes mellitus with diabetic polyneuropathy: Secondary | ICD-10-CM | POA: Diagnosis present

## 2023-12-22 DIAGNOSIS — D519 Vitamin B12 deficiency anemia, unspecified: Secondary | ICD-10-CM | POA: Diagnosis not present

## 2023-12-22 DIAGNOSIS — E66811 Obesity, class 1: Secondary | ICD-10-CM | POA: Diagnosis not present

## 2023-12-22 DIAGNOSIS — I509 Heart failure, unspecified: Secondary | ICD-10-CM | POA: Diagnosis not present

## 2023-12-22 DIAGNOSIS — Z7984 Long term (current) use of oral hypoglycemic drugs: Secondary | ICD-10-CM

## 2023-12-22 DIAGNOSIS — E1159 Type 2 diabetes mellitus with other circulatory complications: Secondary | ICD-10-CM | POA: Diagnosis present

## 2023-12-22 DIAGNOSIS — E1140 Type 2 diabetes mellitus with diabetic neuropathy, unspecified: Secondary | ICD-10-CM | POA: Diagnosis not present

## 2023-12-22 DIAGNOSIS — E785 Hyperlipidemia, unspecified: Secondary | ICD-10-CM | POA: Diagnosis not present

## 2023-12-22 DIAGNOSIS — E039 Hypothyroidism, unspecified: Secondary | ICD-10-CM | POA: Diagnosis present

## 2023-12-22 DIAGNOSIS — E8809 Other disorders of plasma-protein metabolism, not elsewhere classified: Secondary | ICD-10-CM | POA: Diagnosis present

## 2023-12-22 DIAGNOSIS — G250 Essential tremor: Secondary | ICD-10-CM | POA: Diagnosis present

## 2023-12-22 DIAGNOSIS — I5032 Chronic diastolic (congestive) heart failure: Secondary | ICD-10-CM | POA: Diagnosis not present

## 2023-12-22 DIAGNOSIS — R9082 White matter disease, unspecified: Secondary | ICD-10-CM | POA: Diagnosis not present

## 2023-12-22 DIAGNOSIS — I10 Essential (primary) hypertension: Secondary | ICD-10-CM | POA: Diagnosis not present

## 2023-12-22 DIAGNOSIS — R0989 Other specified symptoms and signs involving the circulatory and respiratory systems: Secondary | ICD-10-CM | POA: Diagnosis not present

## 2023-12-22 DIAGNOSIS — E538 Deficiency of other specified B group vitamins: Secondary | ICD-10-CM | POA: Diagnosis present

## 2023-12-22 DIAGNOSIS — I4819 Other persistent atrial fibrillation: Secondary | ICD-10-CM | POA: Diagnosis not present

## 2023-12-22 DIAGNOSIS — I130 Hypertensive heart and chronic kidney disease with heart failure and stage 1 through stage 4 chronic kidney disease, or unspecified chronic kidney disease: Secondary | ICD-10-CM | POA: Diagnosis present

## 2023-12-22 DIAGNOSIS — E038 Other specified hypothyroidism: Secondary | ICD-10-CM | POA: Diagnosis not present

## 2023-12-22 DIAGNOSIS — H524 Presbyopia: Secondary | ICD-10-CM | POA: Diagnosis not present

## 2023-12-22 DIAGNOSIS — I517 Cardiomegaly: Secondary | ICD-10-CM | POA: Diagnosis not present

## 2023-12-22 DIAGNOSIS — N401 Enlarged prostate with lower urinary tract symptoms: Secondary | ICD-10-CM | POA: Diagnosis not present

## 2023-12-22 DIAGNOSIS — Z950 Presence of cardiac pacemaker: Secondary | ICD-10-CM | POA: Diagnosis not present

## 2023-12-22 DIAGNOSIS — R410 Disorientation, unspecified: Secondary | ICD-10-CM | POA: Diagnosis not present

## 2023-12-22 DIAGNOSIS — G43909 Migraine, unspecified, not intractable, without status migrainosus: Secondary | ICD-10-CM | POA: Diagnosis present

## 2023-12-22 DIAGNOSIS — R531 Weakness: Secondary | ICD-10-CM | POA: Diagnosis not present

## 2023-12-22 DIAGNOSIS — D6489 Other specified anemias: Secondary | ICD-10-CM | POA: Diagnosis not present

## 2023-12-22 DIAGNOSIS — K219 Gastro-esophageal reflux disease without esophagitis: Secondary | ICD-10-CM | POA: Diagnosis not present

## 2023-12-22 DIAGNOSIS — Z8249 Family history of ischemic heart disease and other diseases of the circulatory system: Secondary | ICD-10-CM

## 2023-12-22 LAB — CBC WITH DIFFERENTIAL/PLATELET
Abs Immature Granulocytes: 0.01 K/uL (ref 0.00–0.07)
Basophils Absolute: 0.1 K/uL (ref 0.0–0.1)
Basophils Relative: 1 %
Eosinophils Absolute: 0.1 K/uL (ref 0.0–0.5)
Eosinophils Relative: 2 %
HCT: 35 % — ABNORMAL LOW (ref 39.0–52.0)
Hemoglobin: 11.3 g/dL — ABNORMAL LOW (ref 13.0–17.0)
Immature Granulocytes: 0 %
Lymphocytes Relative: 14 %
Lymphs Abs: 0.8 K/uL (ref 0.7–4.0)
MCH: 33.8 pg (ref 26.0–34.0)
MCHC: 32.3 g/dL (ref 30.0–36.0)
MCV: 104.8 fL — ABNORMAL HIGH (ref 80.0–100.0)
Monocytes Absolute: 0.8 K/uL (ref 0.1–1.0)
Monocytes Relative: 14 %
Neutro Abs: 3.9 K/uL (ref 1.7–7.7)
Neutrophils Relative %: 69 %
Platelets: 126 K/uL — ABNORMAL LOW (ref 150–400)
RBC: 3.34 MIL/uL — ABNORMAL LOW (ref 4.22–5.81)
RDW: 13.8 % (ref 11.5–15.5)
WBC: 5.7 K/uL (ref 4.0–10.5)
nRBC: 0 % (ref 0.0–0.2)

## 2023-12-22 LAB — URINALYSIS, ROUTINE W REFLEX MICROSCOPIC
Bacteria, UA: NONE SEEN
Bilirubin Urine: NEGATIVE
Glucose, UA: 50 mg/dL — AB
Hgb urine dipstick: NEGATIVE
Ketones, ur: NEGATIVE mg/dL
Leukocytes,Ua: NEGATIVE
Nitrite: NEGATIVE
Protein, ur: NEGATIVE mg/dL
Specific Gravity, Urine: 1.014 (ref 1.005–1.030)
pH: 7 (ref 5.0–8.0)

## 2023-12-22 LAB — COMPREHENSIVE METABOLIC PANEL WITH GFR
ALT: 15 U/L (ref 0–44)
AST: 18 U/L (ref 15–41)
Albumin: 3.4 g/dL — ABNORMAL LOW (ref 3.5–5.0)
Alkaline Phosphatase: 89 U/L (ref 38–126)
Anion gap: 10 (ref 5–15)
BUN: 24 mg/dL — ABNORMAL HIGH (ref 8–23)
CO2: 22 mmol/L (ref 22–32)
Calcium: 8.5 mg/dL — ABNORMAL LOW (ref 8.9–10.3)
Chloride: 109 mmol/L (ref 98–111)
Creatinine, Ser: 1.43 mg/dL — ABNORMAL HIGH (ref 0.61–1.24)
GFR, Estimated: 48 mL/min — ABNORMAL LOW (ref >60)
Glucose, Bld: 166 mg/dL — ABNORMAL HIGH (ref 70–99)
Potassium: 4.2 mmol/L (ref 3.5–5.1)
Sodium: 141 mmol/L (ref 135–145)
Total Bilirubin: 0.7 mg/dL (ref 0.0–1.2)
Total Protein: 6.5 g/dL (ref 6.5–8.1)

## 2023-12-22 LAB — RAPID URINE DRUG SCREEN, HOSP PERFORMED
Amphetamines: NOT DETECTED
Barbiturates: POSITIVE — AB
Benzodiazepines: NOT DETECTED
Cocaine: NOT DETECTED
Opiates: NOT DETECTED
Tetrahydrocannabinol: NOT DETECTED

## 2023-12-22 LAB — TROPONIN I (HIGH SENSITIVITY)
Troponin I (High Sensitivity): 45 ng/L — ABNORMAL HIGH (ref <18)
Troponin I (High Sensitivity): 48 ng/L — ABNORMAL HIGH (ref <18)

## 2023-12-22 LAB — TSH: TSH: 2.304 u[IU]/mL (ref 0.350–4.500)

## 2023-12-22 LAB — AMMONIA: Ammonia: 21 umol/L (ref 9–35)

## 2023-12-22 LAB — ETHANOL: Alcohol, Ethyl (B): <15 mg/dL (ref <15)

## 2023-12-22 LAB — T4, FREE: Free T4: 1.29 ng/dL — ABNORMAL HIGH (ref 0.61–1.12)

## 2023-12-22 NOTE — ED Provider Notes (Signed)
 [place] EMERGENCY DEPARTMENT AT [facility] Provider Note   CSN: [id number] Arrival date & time: 12/22/23  1355     Patient presents with: No chief complaint on file.   Paul Kent is a 83 y.o. male w/ hx of a fib, on xarelto  and amiodarone , pacemaker, HLD, hypothyroidism presenting to ED with confusion onset this morning.  His wife noted this at breakfast around 0900 when they were speaking for the first time today that he seemed confused, normally is quick to answer but now very delayed and forgetting facts.  Pt denies headache, CP, SOB.  Ems reports normal vital signs and negative stroke scale en route to hospital.  Pt seen by PCP for ocular blepharospasms and visual issues (he recurringly blinks his eyes shut)   HPI     Prior to Admission medications   Medication Sig Start Date End Date Taking? Authorizing Provider  amiodarone  (PACERONE ) 200 MG tablet Take 1 tablet (200 mg total) by mouth daily. Take this medication twice daily for 1 week then switch to daily. 11/27/23   Krasowski, Robert J, MD  atorvastatin  (LIPITOR) 10 MG tablet TAKE ONE TABLET BY MOUTH EVERY EVENING 11/08/23   CoxAbigail, MD  baclofen  (LIORESAL ) 10 MG tablet Take 1 tablet (10 mg total) by mouth 3 (three) times daily. 12/19/23   CoxAbigail, MD  gabapentin  (NEURONTIN ) 300 MG capsule Take 1 capsule (300 mg total) by mouth at bedtime. 07/22/23   CoxAbigail, MD  levothyroxine  (SYNTHROID ) 137 MCG tablet TAKE ONE TABLET BY MOUTH EVERY MORNING 09/12/23   Cox, Abigail, MD  primidone  (MYSOLINE ) 50 MG tablet Take 1 tablet (50 mg total) by mouth 2 (two) times daily. 09/12/23   Cox, Abigail, MD  tamsulosin  (FLOMAX ) 0.4 MG CAPS capsule TAKE ONE CAPSULE BY MOUTH EVERY EVENING 09/27/23   Sirivol, Mamatha, MD  tirzepatide  (MOUNJARO ) 5 MG/0.5ML Pen Inject 5 mg into the skin once a week. 12/19/23   Sherre Abigail, MD  XARELTO  15 MG TABS tablet TAKE ONE TABLET BY MOUTH EVERY EVENING 10/23/23   Krasowski, Robert J, MD     Allergies: Propranolol, Canagliflozin, Clonidine derivatives, Hydralazine  hcl, Metformin and related, Topiramate, Ambien  [zolpidem ], Farxiga  [dapagliflozin ], and Ozempic  (0.25 or 0.5 mg-dose) [semaglutide (0.25 or 0.5mg -dos)]    Review of Systems  Updated Vital Signs BP (!) 160/82   Pulse (!) 59   Temp 98.4 F (36.9 C)   Resp 16   SpO2 93%   Physical Exam Constitutional:      General: He is not in acute distress. HENT:     Head: Normocephalic and atraumatic.  Eyes:     Conjunctiva/sclera: Conjunctivae normal.     Pupils: Pupils are equal, round, and reactive to light.  Cardiovascular:     Rate and Rhythm: Normal rate and regular rhythm.  Pulmonary:     Effort: Pulmonary effort is normal. No respiratory distress.  Abdominal:     General: There is no distension.     Tenderness: There is no abdominal tenderness.  Skin:    General: Skin is warm and dry.  Neurological:     General: No focal deficit present.     Mental Status: He is alert. Mental status is at baseline.     Comments: Slow to respond, oriented only to self, moves all extremities to command, motor strength symmetrical, no facial droop, recurring prolonged eye blinks     (all labs ordered are listed, but only abnormal results are displayed) Labs Reviewed  CBC WITH  DIFFERENTIAL/PLATELET - Abnormal; Notable for the following components:      Result Value   RBC 3.34 (*)    Hemoglobin 11.3 (*)    HCT 35.0 (*)    MCV 104.8 (*)    Platelets 126 (*)    All other components within normal limits  COMPREHENSIVE METABOLIC PANEL WITH GFR - Abnormal; Notable for the following components:   Glucose, Bld 166 (*)    BUN 24 (*)    Creatinine, Ser 1.43 (*)    Calcium  8.5 (*)    Albumin 3.4 (*)    GFR, Estimated 48 (*)    All other components within normal limits  T4, FREE - Abnormal; Notable for the following components:   Free T4 1.29 (*)    All other components within normal limits  TROPONIN I (HIGH SENSITIVITY) -  Abnormal; Notable for the following components:   Troponin I (High Sensitivity) 45 (*)    All other components within normal limits  AMMONIA  ETHANOL  TSH  RAPID URINE DRUG SCREEN, HOSP PERFORMED  URINALYSIS, ROUTINE W REFLEX MICROSCOPIC    EKG: EKG Interpretation Date/Time:  Saturday December 22 2023 14:04:33 EDT Ventricular Rate:  60 PR Interval:  185 QRS Duration:  171 QT Interval:  543 QTC Calculation: 543 R Axis:   91  Text Interpretation: Sinus rhythm Nonspecific intraventricular conduction delay Abnormal lateral Q waves Confirmed by Cottie Cough [phone]) on 12/22/2023 2:24:33 PM  Radiology: No results found.   Procedures   Medications Ordered in the ED - No data to display                                  Medical Decision Making Amount and/or Complexity of Data Reviewed Labs: ordered. Radiology: ordered.   This patient presents to the Emergency Department with complaint of altered mental status.  This involves an extensive number of treatment options, and is a complaint that carries with it a high risk of complications and morbidity.  The differential diagnosis includes hypoglycemia vs metabolic encephalopathy vs infection (including cystitis) vs ICH vs stroke vs polypharmacy vs other  No obvious localizing stroke or seizure symptoms on arrival.  Patient moving all extremities, speech is clear.  I ordered, reviewed, and interpreted labs, including Ammonia and WBC wnl.  Trop 45, repeat pending.  UA pending   I ordered imaging studies which included CT head  Additional history was obtained from EMS  I personally reviewed the patients ECG which showed sinus rhythm with no acute ischemic findings  Patient is signed out to Dr Glendia Rummer EDP at 330 pm pending follow up on labs, UA, CT imaging for AMS - low threshold for admission      Final diagnoses:  Altered mental status, unspecified altered mental status type    ED Discharge Orders     None           Cottie Cough PARAS, MD 12/22/23 1553

## 2023-12-22 NOTE — ED Notes (Signed)
 Please update sally wife [phone]

## 2023-12-22 NOTE — ED Provider Notes (Addendum)
 Patient with confusion.  No family member has showed up.  Labs TSH normal.  White count normal.  Hemoglobin 11.3 ammonia 21 urine drug screen positive for barbiturates.  Not sure if he is taking anything like that nothing listed.  Urinalysis negative complete metabolic panel GFR is 48 glucose 166 no significant change there.  Initial troponin was 45 so needs delta troponin.  Free T4 was 1.29.  CT head without any acute findings.  Patient's EKG he has a pacemaker.  So probably not compatible or unknown if it is compatible with MRI.  Needs delta troponin will get chest x-ray.   Laderius Valbuena, MD 12/22/23 1752   Patient [facility].   Tawyna Pellot, MD 12/22/23 1754  Patient still significantly confused.  Patient's delta troponins are quite stable.  Did try to call his wife Ginnie.  Did not get an answer.  Left a message.  I think patient is probably going to probably need to be moved towards a nursing facility.  Unable to do MRI brain due to the pacemaker.  Patient's home meds ordered.  Attempted to contact his wife.  Not able to get a hold of her.  Think patient is stable but not stable enough to go back home.  Will have him evaluated for nursing home placement.   Chyenne Sobczak, MD 12/22/23 7964    Geraldene Hamilton, MD 12/22/23 2350    Geraldene Hamilton, MD 12/23/23 [phone]

## 2023-12-22 NOTE — ED Notes (Signed)
 Patient's wife updated on patient's condition and plan of care by ED RN .

## 2023-12-22 NOTE — ED Triage Notes (Addendum)
 Pt [facility] from home for confusion. Woke up at 9 am this way. Negative stroke screen.  Struggles to follow commands.  A&O at baseline.  Intermittent confusion. Recognizes his wife and some objects but not others. Couldn't say his DOB. Slower to respond than normal.  Pt takes xarelto , has pacemaker.   156/82  95%, Hr 76, CBG 195   20 G L. wrist

## 2023-12-22 NOTE — ED Notes (Addendum)
 Patient assisted back to bed , redirected , confused and disoriented , respirations unlabored , adult brief applied , complete bed change , 2nd Troponin specimen collected , IV site intact. Fall mat checked , bilateral rails up .

## 2023-12-23 DIAGNOSIS — D696 Thrombocytopenia, unspecified: Secondary | ICD-10-CM | POA: Diagnosis present

## 2023-12-23 DIAGNOSIS — I5032 Chronic diastolic (congestive) heart failure: Secondary | ICD-10-CM | POA: Diagnosis present

## 2023-12-23 DIAGNOSIS — G9340 Encephalopathy, unspecified: Secondary | ICD-10-CM | POA: Diagnosis present

## 2023-12-23 DIAGNOSIS — D539 Nutritional anemia, unspecified: Secondary | ICD-10-CM | POA: Diagnosis present

## 2023-12-23 LAB — CBC
HCT: 39.4 % (ref 39.0–52.0)
Hemoglobin: 12.7 g/dL — ABNORMAL LOW (ref 13.0–17.0)
MCH: 33.1 pg (ref 26.0–34.0)
MCHC: 32.2 g/dL (ref 30.0–36.0)
MCV: 102.6 fL — ABNORMAL HIGH (ref 80.0–100.0)
Platelets: 141 K/uL — ABNORMAL LOW (ref 150–400)
RBC: 3.84 MIL/uL — ABNORMAL LOW (ref 4.22–5.81)
RDW: 13.8 % (ref 11.5–15.5)
WBC: 5.5 K/uL (ref 4.0–10.5)
nRBC: 0 % (ref 0.0–0.2)

## 2023-12-23 LAB — BASIC METABOLIC PANEL WITH GFR
Anion gap: 11 (ref 5–15)
BUN: 18 mg/dL (ref 8–23)
CO2: 22 mmol/L (ref 22–32)
Calcium: 9.1 mg/dL (ref 8.9–10.3)
Chloride: 107 mmol/L (ref 98–111)
Creatinine, Ser: 1.22 mg/dL (ref 0.61–1.24)
GFR, Estimated: 58 mL/min — ABNORMAL LOW (ref >60)
Glucose, Bld: 157 mg/dL — ABNORMAL HIGH (ref 70–99)
Potassium: 4.5 mmol/L (ref 3.5–5.1)
Sodium: 140 mmol/L (ref 135–145)

## 2023-12-23 LAB — RESP PANEL BY RT-PCR (RSV, FLU A&B, COVID)  RVPGX2
Influenza A by PCR: NEGATIVE
Influenza B by PCR: NEGATIVE
Resp Syncytial Virus by PCR: NEGATIVE
SARS Coronavirus 2 by RT PCR: NEGATIVE

## 2023-12-23 MED ORDER — ONDANSETRON HCL 4 MG PO TABS: 4.0000 mg | ORAL_TABLET | Freq: Four times a day (QID) | ORAL | Status: DC | PRN

## 2023-12-23 MED ORDER — RIVAROXABAN 15 MG PO TABS
15.0000 mg | ORAL_TABLET | Freq: Every evening | ORAL | Status: DC
  Administered 2023-12-24 – 2023-12-26 (×3): 15 mg via ORAL
  Filled 2023-12-23 (×5): qty 1

## 2023-12-23 MED ORDER — GABAPENTIN 300 MG PO CAPS
300.0000 mg | ORAL_CAPSULE | Freq: Every day | ORAL | Status: DC
  Administered 2023-12-23: 300 mg via ORAL
  Filled 2023-12-23: qty 1

## 2023-12-23 MED ORDER — LEVOTHYROXINE SODIUM 137 MCG PO TABS
137.0000 ug | ORAL_TABLET | Freq: Every morning | ORAL | Status: DC
  Administered 2023-12-23 – 2023-12-27 (×5): 137 ug via ORAL
  Filled 2023-12-23 (×5): qty 1

## 2023-12-23 MED ORDER — ACETAMINOPHEN 650 MG RE SUPP: 650.0000 mg | Freq: Four times a day (QID) | RECTAL | Status: DC | PRN

## 2023-12-23 MED ORDER — TIRZEPATIDE 5 MG/0.5ML ~~LOC~~ SOAJ: 5.0000 mg | SUBCUTANEOUS | Status: DC

## 2023-12-23 MED ORDER — INSULIN ASPART 100 UNIT/ML IJ SOLN
0.0000 [IU] | Freq: Three times a day (TID) | INTRAMUSCULAR | Status: DC
  Administered 2023-12-24: 1 [IU] via SUBCUTANEOUS
  Administered 2023-12-24: 2 [IU] via SUBCUTANEOUS
  Administered 2023-12-24 – 2023-12-25 (×2): 3 [IU] via SUBCUTANEOUS

## 2023-12-23 MED ORDER — PRIMIDONE 50 MG PO TABS
50.0000 mg | ORAL_TABLET | Freq: Two times a day (BID) | ORAL | Status: DC
  Administered 2023-12-23 (×2): 50 mg via ORAL
  Filled 2023-12-23 (×4): qty 1

## 2023-12-23 MED ORDER — TAMSULOSIN HCL 0.4 MG PO CAPS
0.4000 mg | ORAL_CAPSULE | Freq: Every evening | ORAL | Status: DC
  Administered 2023-12-24 – 2023-12-26 (×3): 0.4 mg via ORAL
  Filled 2023-12-23 (×4): qty 1

## 2023-12-23 MED ORDER — BISACODYL 5 MG PO TBEC
5.0000 mg | DELAYED_RELEASE_TABLET | Freq: Every day | ORAL | Status: DC | PRN
  Filled 2023-12-23: qty 1

## 2023-12-23 MED ORDER — VITAMIN B-12 1000 MCG PO TABS
1000.0000 ug | ORAL_TABLET | Freq: Every day | ORAL | Status: DC
  Administered 2023-12-24 – 2023-12-27 (×4): 1000 ug via ORAL
  Filled 2023-12-23 (×4): qty 1

## 2023-12-23 MED ORDER — ACETAMINOPHEN 325 MG PO TABS: 650.0000 mg | ORAL_TABLET | Freq: Four times a day (QID) | ORAL | Status: DC | PRN

## 2023-12-23 MED ORDER — SENNOSIDES-DOCUSATE SODIUM 8.6-50 MG PO TABS
1.0000 | ORAL_TABLET | Freq: Every evening | ORAL | Status: DC | PRN
Start: 2023-12-23 — End: 2023-12-27

## 2023-12-23 MED ORDER — ONDANSETRON HCL 4 MG/2ML IJ SOLN
4.0000 mg | Freq: Four times a day (QID) | INTRAMUSCULAR | Status: DC | PRN
Start: 2023-12-23 — End: 2023-12-27

## 2023-12-23 MED ORDER — ATORVASTATIN CALCIUM 10 MG PO TABS
10.0000 mg | ORAL_TABLET | Freq: Every evening | ORAL | Status: DC
  Administered 2023-12-24 – 2023-12-26 (×3): 10 mg via ORAL
  Filled 2023-12-23 (×4): qty 1

## 2023-12-23 MED ORDER — AMIODARONE HCL 200 MG PO TABS
200.0000 mg | ORAL_TABLET | Freq: Every day | ORAL | Status: DC
  Administered 2023-12-24 – 2023-12-27 (×4): 200 mg via ORAL
  Filled 2023-12-23 (×5): qty 1

## 2023-12-23 MED ORDER — ACETAMINOPHEN 325 MG PO TABS
650.0000 mg | ORAL_TABLET | Freq: Once | ORAL | Status: AC
  Administered 2023-12-23: 650 mg via ORAL
  Filled 2023-12-23: qty 2

## 2023-12-23 MED ORDER — LACTATED RINGERS IV SOLN: INTRAVENOUS | Status: AC

## 2023-12-23 MED ORDER — BACLOFEN 10 MG PO TABS
10.0000 mg | ORAL_TABLET | Freq: Three times a day (TID) | ORAL | Status: DC
  Administered 2023-12-23 (×2): 10 mg via ORAL
  Filled 2023-12-23 (×2): qty 1

## 2023-12-23 NOTE — ED Notes (Signed)
 Pt remains somnolent throughout the day. Not following commands. Rectal temp obtained, pt noted to be febrile. MD notified.

## 2023-12-23 NOTE — ED Notes (Signed)
 Breakfast tray set up in front of patient.

## 2023-12-23 NOTE — ED Notes (Signed)
 REQ UPDATE: Paul Kent [phone]

## 2023-12-23 NOTE — Hospital Course (Signed)
 Paul Kent is a 83 y.o. male with medical history significant for persistent A-fib on Xarelto , chronic HFpEF, SSS s/p PPM, CKD stage IIIb, T2DM, HTN, HLD, hypothyroidism, macrocytic anemia, thrombocytopenia, BPH, chronic blepharospasm, essential tremor, and OSA on CPAP who is admitted with acute encephalopathy.

## 2023-12-23 NOTE — ED Notes (Signed)
 Patent changed and new bedding placed and warm blanket given to patient

## 2023-12-23 NOTE — Care Management (Addendum)
 Transition of Care [facility]) - Emergency Department Mini Assessment   Patient Details  Name: Paul Kent MRN: [id number] Date of Birth: 1941/03/05  Transition of Care [facility]) CM/SW Contact:    Corean JAYSON Canary, RN Phone Number: 12/23/2023, 8:21 AM   Clinical Narrative:  Chart reviewed, called Mrs Oxley for collateral. Patient currently resting with eyes closed in room. Mrs. Sherrin states he does fairly well mobility wise, he has all DME needed. She cares for him at home. Her son is local. She is interested in home health, last year they had [facility]. Will send orders to them fax [phone] messaged with team She and their son can pick the patient up around noon. Referral faxed to [facility] [phone] ED Mini Assessment: What brought you to the Emergency Department? : Confusion  Barriers to Discharge: ED No Barriers        Interventions which prevented an admission or readmission: Home Health Consult or Services    Patient Contact and Communications Key Contact 1: Mrs Wayson [phone]      ,                 Admission diagnosis:  confusion Patient Active Problem List   Diagnosis Date Noted   Memory loss 12/20/2023   Blepharospasm of both eyes 09/14/2023   Presbyopia 09/13/2023   Encounter for Medicare annual wellness exam 06/09/2023   Blepharospasm 06/02/2023   Skin tear of left hand without complication 03/15/2023   History of recent fall 02/06/2023   Pedal edema 01/11/2023   Persistent atrial fibrillation (HCC) 12/15/2022   Ptosis of both upper eyelids 05/30/2022   Entropion of both eyes 05/30/2022   Hypertensive heart and renal disease, stage 1-4 or unspecified chronic kidney disease, with heart failure (HCC) 07/31/2021   Enlarged prostate with urinary obstruction 07/31/2021   Stage 3b chronic kidney disease (HCC) 04/26/2021   Gastroesophageal reflux disease without esophagitis 04/26/2021   Polyuria 04/26/2021   Diabetic  polyneuropathy associated with type 2 diabetes mellitus (HCC) 04/26/2021   Hypercoagulable state due to persistent atrial fibrillation (HCC) 04/26/2021   Status post ablation of atrial fibrillation 09/16/2020   Primary insomnia    Male erectile disorder    Hyperlipidemia    History of bladder cancer    Arthralgia of left temporomandibular joint    Acquired bilateral hammer toes    Congestive heart failure (CHF) (HCC) 02/2018   Hypothyroidism 03/07/2017   Pacemaker reprogramming/check 02/12/2017   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   OSA on CPAP 01/17/2017   Sick sinus syndrome (HCC) 01/17/2017   Paroxysmal atrial fibrillation (HCC) 05/2016   Essential tremor 06/03/2013   Herpes zoster with nervous system complication 06/03/2013   Lesion of femoral nerve 04/30/2013   PCP:  Sherre Clapper, MD Pharmacy:   [facility] - [place], [place] - [address] [place] [address] Phone: [phone] Fax: [phone]

## 2023-12-23 NOTE — ED Provider Notes (Signed)
 3:16 PM Assumed care of patient from off-going team. For more details, please see note from same day.  In brief, this is a 83 y.o. male with altered mental status and new fever in the ED. -7/5 CXR clear -7/5 UA with no UTI  Family at bedside states that patient is NOT acting at baseline. He is normally A&Ox4, ambulatory, cares for and feeds himself. They state this started acutely 2 days ago. Patient is consulted to medicine.    BP (!) 154/74   Pulse 60   Temp (!) 100.8 F (38.2 C) (Rectal)   Resp 13   SpO2 99%    ED Course:   Clinical Course as of 12/27/23 1515  Sun Dec 23, 2023  1622 Discussed with family at bedside who states that at baseline, patient is A&Ox4, walks with walker, talkative, cares for himself. This is very different from what they are seeing now. Patient also just spiked a small fever. Will repeat urine and admit to hospitalist.  [HN]    Clinical Course User Index [HN] Franklyn Sid SAILOR, MD    Dispo: Admit to medicine ------------------------------- Sid Franklyn, MD Emergency Medicine  This note was created using dictation software, which may contain spelling or grammatical errors.   Franklyn Sid SAILOR, MD 12/27/23 [phone]

## 2023-12-23 NOTE — H&P (Signed)
 History and Physical    Paul Kent FMW:[id number] DOB: Aug 26, 1940 DOA: 12/22/2023  PCP: Sherre Clapper, MD  Patient coming from: Home  I have personally briefly reviewed patient's old medical records in [facility]  Chief Complaint: Altered mental status  HPI: Paul Kent is a 83 y.o. male with medical history significant for persistent A-fib on Xarelto , chronic HFpEF, SSS s/p PPM, CKD stage IIIb, T2DM, HTN, HLD, hypothyroidism, macrocytic anemia, thrombocytopenia, BPH, chronic blepharospasm, essential tremor, and OSA on CPAP who presented to the ED yesterday (7/5) for evaluation of altered mental status.  Patient unable to provide history due to encephalopathy which is otherwise supplemented by EDP, chart review, and spouse by phone.  Per spouse, patient was in his usual state of health 2 days ago.  At baseline he is walking and talking appropriately.  Usually uses a walker to ambulate.  Has been going to the gym regularly.  He has been dealing with spasms of his eyelids for over a year now.  He was recently started on baclofen  10 mg 3 times daily by his PCP for management.  He took about 1-1/2 days worth of the medicine before he had a change in mentation.  Has been confused and lethargic.  Has been slower to respond than normal.  This is quite a significant change from his baseline per spouse.  ED Course  Labs/Imaging on admission: I have personally reviewed following labs and imaging studies.  Initial vitals showed BP 160/82, pulse 60, RR 16, temp 98.4 F, SpO2 93% on room air.  While boarding in the ED patient developed fever 100.8 F rectally today.  Initial labs showed WBC 5.7, hemoglobin 11.3, platelets 126, sodium 141, potassium 4.2, bicarb 22, BUN 24, creatinine 1.43, serum glucose 166, LFTs within normal limits, ammonia 21, troponin 45 > 48.  TSH 2.304, free T41.29.  UA negative for UTI.  Serum ethanol <15.  UDS positive for barbiturates (patient takes  primidone ).  CT head without contrast negative for acute intracranial findings.  Atrophy and white matter microvascular disease noted.  Portable chest x-ray showed mild cardiomegaly and vascular congestion without overt edema.  No focal consolidation or effusion.  Left-sided pacemaker seen in place.  Patient was resumed on most of his home meds.  He was boarding in the ED for evaluation for nursing home placement.  Developed fever today and upon further history from family it was felt significant change in mentation happened acutely therefore the hospitalist service was consulted to admit for further evaluation and management..  Review of Systems:  Unable to complete full review of systems due to encephalopathy.  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) 04/26/2021   Corns and callosities    Deviated septum 07/01/2018   Deviated septum 07/01/2018   Diabetes mellitus due to underlying condition with unspecified complications (HCC) 06/08/2016   Diabetic ulcer of right heel associated with diabetes mellitus due to underlying condition, limited to breakdown of skin (HCC) 07/31/2021   Disturbances of salivary secretion  Epistaxis    Essential hypertension    Essential tremor    H pylori ulcer    H pylori ulcer    Herpes zoster with nervous system complication 06/03/2013   Hesitancy of micturition    Hyperlipidemia    Hypothyroidism    Impacted cerumen, bilateral    Jaw pain    Lesion of femoral nerve 04/30/2013   Localized edema    Male erectile disorder    Malignant neoplasm  of overlapping sites of bladder (HCC)    Nasal congestion 07/01/2018   Nasal turbinate hypertrophy 07/01/2018   Obstructive sleep apnea 01/17/2017   Other constipation    Other fatigue    Overweight    Pacemaker reprogramming/check 02/12/2017   Pain in right finger(s)    Pain in right lower leg    Paroxysmal atrial fibrillation (HCC) 06/08/2016   Peptic ulcer disease    Persistent atrial fibrillation (HCC) 06/01/2017   Primary insomnia    Renal stones    Renal stones    Secondary hypercoagulable state (HCC) 06/17/2020   Shingles 2014   WITH COMPLICATIONS (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)   Shingles 06/2012   Shortness of breath    Sick sinus syndrome (HCC)    Sinus bradycardia 03/07/2017   Sleep apnea    dx 08/2015, CPAP   Spontaneous ecchymoses    Status post ablation of atrial fibrillation 09/16/2020   Testicular hypofunction    Thoracic or lumbosacral neuritis or radiculitis 06/03/2013   Trigger middle finger of right hand 11/28/2019   Trigger middle finger of right hand 11/28/2019   Type 2 diabetes mellitus with circulatory disorder, without long-term current use of insulin  (HCC) 01/17/2017    Past Surgical History:  Procedure Laterality Date   ABLATION  06/2017   ATRIAL FIBRILLATION ABLATION N/A 05/21/2020   Procedure: ATRIAL FIBRILLATION ABLATION;  Surgeon: Inocencio Soyla Lunger, MD;  Location: [facility] INVASIVE CV LAB;  Service: Cardiovascular;  Laterality: N/A;   CARDIOVERSION N/A 03/22/2018   Procedure: CARDIOVERSION;  Surgeon: Lonni Slain, MD;  Location: [facility] ENDOSCOPY;  Service: Cardiovascular;  Laterality: N/A;   CARDIOVERSION N/A 07/08/2020   Procedure: CARDIOVERSION;  Surgeon: Jeffrie Oneil BROCKS, MD;  Location: [facility] ENDOSCOPY;  Service: Cardiovascular;  Laterality: N/A;   HEMIARTHROPLASTY HIP  10/31/2013   IT HIP BIPOLAR   LUMBAR SPINE SURGERY     PACEMAKER PLACEMENT  01/29/2017   SHOULDER SURGERY Right     Social History: Social  History   Tobacco Use   Smoking status: Never   Smokeless tobacco: Never  Vaping Use   Vaping status: Never Used  Substance Use Topics   Alcohol use: Not Currently   Drug use: Never   Allergies  Allergen Reactions   Propranolol     Drops HR too low  Other reaction(s): Other (See Comments) Drops HR too low Drops HR too low Drops HR too low   Canagliflozin     Patient had an adverse reaction Other reaction(s): Other (See Comments) Patient had an adverse reaction Patient had an adverse reaction   Clonidine Derivatives Nausea And Vomiting   Hydralazine  Hcl     Patient had an adverse reaction    Metformin And Related Diarrhea   Topiramate     Patient had an adverse reaction Other reaction(s): Other (See Comments) Patient had an adverse reaction Patient had an adverse reaction   Ambien  [Zolpidem ] Other (See Comments)    Amnesia    Farxiga  [Dapagliflozin ]     Recurrent  UTIs.    Ozempic  (0.25 Or 0.5 Mg-Dose) [Semaglutide (0.25 Or 0.5mg -Dos)] Other (See Comments)    Nausea, abdominal pain     Family History  Problem Relation Age of Onset   Heart attack Mother    Transient ischemic attack Mother    Heart Problems Father    Tuberculosis Brother    Diabetes type II Other    Hyperlipidemia Other    Congestive Heart Failure Other    Arthritis Other      Prior to Admission medications   Medication Sig Start Date End Date Taking? Authorizing Provider  amiodarone  (PACERONE ) 200 MG tablet Take 1 tablet (200 mg total) by mouth daily. Take this medication twice daily for 1 week then switch to daily. 11/27/23  Yes Krasowski, Robert J, MD  atorvastatin  (LIPITOR) 10 MG tablet TAKE ONE TABLET BY MOUTH EVERY EVENING 11/08/23  Yes Cox, Kirsten, MD  baclofen  (LIORESAL ) 10 MG tablet Take 1 tablet (10 mg total) by mouth 3 (three) times daily. 12/19/23  Yes Cox, Kirsten, MD  famotidine  (PEPCID ) 40 MG tablet Take 40 mg by mouth daily.   Yes [provider]  gabapentin  (NEURONTIN )  300 MG capsule TAKE ONE CAPSULE BY MOUTH AT BEDTIME 12/23/23  Yes Cox, Kirsten, MD  glipiZIDE  (GLUCOTROL  XL) 10 MG 24 hr tablet Take 10 mg by mouth 2 (two) times daily.   Yes [provider]  levothyroxine  (SYNTHROID ) 137 MCG tablet TAKE ONE TABLET BY MOUTH EVERY MORNING 09/12/23  Yes Cox, Kirsten, MD  primidone  (MYSOLINE ) 50 MG tablet Take 1 tablet (50 mg total) by mouth 2 (two) times daily. 09/12/23  Yes Cox, Kirsten, MD  tamsulosin  (FLOMAX ) 0.4 MG CAPS capsule TAKE ONE CAPSULE BY MOUTH EVERY EVENING 09/27/23  Yes Sirivol, Mamatha, MD  tirzepatide  (MOUNJARO ) 5 MG/0.5ML Pen Inject 5 mg into the skin once a week. 12/19/23  Yes Cox, Kirsten, MD  torsemide  (DEMADEX ) 20 MG tablet Take 20 mg by mouth daily as needed. 12/12/23  Yes [provider]  XARELTO  15 MG TABS tablet TAKE ONE TABLET BY MOUTH EVERY EVENING 10/23/23  Yes Bernie Lamar PARAS, MD    Physical Exam: Vitals:   12/23/23 1736 12/23/23 1800 12/23/23 1804 12/23/23 1900  BP: 123/68 (!) 145/70  (!) 150/73  Pulse: 60 60  (!) 59  Resp: 17 20  12   Temp:   98.8 F (37.1 C)   TempSrc:   Axillary   SpO2: 94% 97%  99%   Exam limited due to encephalopathy Constitutional: Resting in bed with head elevated, minimally verbal, not following commands Eyes: Keeps eyes closed, intermittent spasm of eyelids ENMT: Mucous membranes are dry. Posterior pharynx clear of any exudate or lesions. Neck: normal, supple, no masses. Respiratory: clear to auscultation anteriorly. Normal respiratory effort. No accessory muscle use.  Cardiovascular: Regular rate and rhythm, no murmurs / rubs / gallops. No extremity edema. 2+ pedal pulses.  Left PPM left upper chest. Abdomen: no obvious tenderness on palpation Musculoskeletal: no clubbing / cyanosis. No joint deformity upper and lower extremities.  Skin: no rashes, lesions, ulcers. No induration Neurologic: Not following commands, encephalopathic Psychiatric: Encephalopathic  EKG: Personally  reviewed.  Paced rhythm.  Assessment/Plan Principal Problem:   Acute encephalopathy Active Problems:   Cardiac pacemaker in situ   Essential tremor   Hypothyroidism   OSA on CPAP   Sick sinus syndrome (HCC)   Hyperlipidemia associated with type 2 diabetes mellitus (HCC)   Stage 3b chronic kidney disease (HCC)   Type 2 diabetes  mellitus with peripheral neuropathy (HCC)   Persistent atrial fibrillation (HCC)   Blepharospasm   Chronic heart failure with preserved ejection fraction (HFpEF, >= 50%) (HCC)   Macrocytic anemia   Thrombocytopenia (HCC)   Paul Kent is a 83 y.o. male with medical history significant for persistent A-fib on Xarelto , chronic HFpEF, SSS s/p PPM, CKD stage IIIb, T2DM, HTN, HLD, hypothyroidism, macrocytic anemia, thrombocytopenia, BPH, chronic blepharospasm, essential tremor, and OSA on CPAP who is admitted with acute encephalopathy.  Assessment and Plan: Acute encephalopathy/fever: New onset encephalopathy 2 days prior to admission.  Has been confused, lethargic, slow to respond.  Very somnolent on exam and not following commands.  Recently started on baclofen  which may be primary etiology with decreased clearance in setting of renal dysfunction.  Did have mild fever in the ED without obvious infectious source.  Has not been mentating well enough to eat on his own over the last 24 hours. - Stop baclofen , hold gabapentin  and primidone  - Start IV fluid hydration overnight - Check COVID, influenza, RSV panel - Obtain blood cultures  Persistent atrial fibrillation SSS s/p PPM: Paced rhythm on admission.  Continue amiodarone  and Xarelto .  Chronic HFpEF: Appears mildly hypovolemic on admission.  Holding torsemide .  Receiving IV fluids as above.  CKD stage IIIb: Renal function actually improved from baseline.  Continue to monitor.  Type 2 diabetes with peripheral neuropathy: Placed on SSI.  Holding gabapentin .  Macrocytic anemia: Hemoglobin stable.   Recent labs consistent with B12 deficiency.  Continue B12 supplement.  Thrombocytopenia: Chronic and mild without obvious bleeding.  Continue to monitor.  Hyperlipidemia: Continue atorvastatin .  Chronic blepharospasm: Holding baclofen  as above.  Hypothyroidism: Continue Synthroid .  Essential tremor: Holding primidone .  BPH: Continue Flomax .  OSA: Can resume CPAP when mentation improves.   DVT prophylaxis:  Rivaroxaban  (XARELTO ) tablet 15 mg   Code Status:   Code Status: Do not attempt resuscitation (DNR) PRE-ARREST INTERVENTIONS DESIRED discussed and confirmed with patient's spouse by phone at time of admission. Family Communication: Spouse by phone Disposition Plan: From home, dispo pending clinical progress Consults called: None Severity of Illness: The appropriate patient status for this patient is OBSERVATION. Observation status is judged to be reasonable and necessary in order to provide the required intensity of service to ensure the patient's safety. The patient's presenting symptoms, physical exam findings, and initial radiographic and laboratory data in the context of their medical condition is felt to place them at decreased risk for further clinical deterioration. Furthermore, it is anticipated that the patient will be medically stable for discharge from the hospital within 2 midnights of admission.   Jorie Blanch MD [facility]  If 7PM-7AM, please contact night-coverage [email or web address]  12/23/2023, 8:58 PM

## 2023-12-23 NOTE — Discharge Instructions (Signed)
 Continue your home medications.  Follow-up with your primary care doctor to be rechecked.  Consider following up with a neurologist for further evaluation.

## 2023-12-24 DIAGNOSIS — D539 Nutritional anemia, unspecified: Secondary | ICD-10-CM | POA: Diagnosis not present

## 2023-12-24 DIAGNOSIS — E038 Other specified hypothyroidism: Secondary | ICD-10-CM | POA: Diagnosis not present

## 2023-12-24 DIAGNOSIS — Z950 Presence of cardiac pacemaker: Secondary | ICD-10-CM | POA: Diagnosis not present

## 2023-12-24 DIAGNOSIS — D696 Thrombocytopenia, unspecified: Secondary | ICD-10-CM | POA: Diagnosis present

## 2023-12-24 DIAGNOSIS — E1142 Type 2 diabetes mellitus with diabetic polyneuropathy: Secondary | ICD-10-CM | POA: Diagnosis not present

## 2023-12-24 DIAGNOSIS — E1159 Type 2 diabetes mellitus with other circulatory complications: Secondary | ICD-10-CM | POA: Diagnosis present

## 2023-12-24 DIAGNOSIS — G250 Essential tremor: Secondary | ICD-10-CM | POA: Diagnosis not present

## 2023-12-24 DIAGNOSIS — E785 Hyperlipidemia, unspecified: Secondary | ICD-10-CM | POA: Diagnosis present

## 2023-12-24 DIAGNOSIS — I4819 Other persistent atrial fibrillation: Secondary | ICD-10-CM | POA: Diagnosis present

## 2023-12-24 DIAGNOSIS — R4182 Altered mental status, unspecified: Secondary | ICD-10-CM | POA: Diagnosis not present

## 2023-12-24 DIAGNOSIS — T428X5A Adverse effect of antiparkinsonism drugs and other central muscle-tone depressants, initial encounter: Secondary | ICD-10-CM | POA: Diagnosis present

## 2023-12-24 DIAGNOSIS — Z7901 Long term (current) use of anticoagulants: Secondary | ICD-10-CM | POA: Diagnosis not present

## 2023-12-24 DIAGNOSIS — I130 Hypertensive heart and chronic kidney disease with heart failure and stage 1 through stage 4 chronic kidney disease, or unspecified chronic kidney disease: Secondary | ICD-10-CM | POA: Diagnosis present

## 2023-12-24 DIAGNOSIS — Z7985 Long-term (current) use of injectable non-insulin antidiabetic drugs: Secondary | ICD-10-CM | POA: Diagnosis not present

## 2023-12-24 DIAGNOSIS — E861 Hypovolemia: Secondary | ICD-10-CM | POA: Diagnosis present

## 2023-12-24 DIAGNOSIS — I495 Sick sinus syndrome: Secondary | ICD-10-CM | POA: Diagnosis present

## 2023-12-24 DIAGNOSIS — I5032 Chronic diastolic (congestive) heart failure: Secondary | ICD-10-CM | POA: Diagnosis not present

## 2023-12-24 DIAGNOSIS — Z66 Do not resuscitate: Secondary | ICD-10-CM | POA: Diagnosis present

## 2023-12-24 DIAGNOSIS — Z79890 Hormone replacement therapy (postmenopausal): Secondary | ICD-10-CM | POA: Diagnosis not present

## 2023-12-24 DIAGNOSIS — N1832 Chronic kidney disease, stage 3b: Secondary | ICD-10-CM | POA: Diagnosis present

## 2023-12-24 DIAGNOSIS — G928 Other toxic encephalopathy: Secondary | ICD-10-CM | POA: Diagnosis present

## 2023-12-24 DIAGNOSIS — E039 Hypothyroidism, unspecified: Secondary | ICD-10-CM | POA: Diagnosis not present

## 2023-12-24 DIAGNOSIS — Z7984 Long term (current) use of oral hypoglycemic drugs: Secondary | ICD-10-CM | POA: Diagnosis not present

## 2023-12-24 DIAGNOSIS — E1169 Type 2 diabetes mellitus with other specified complication: Secondary | ICD-10-CM | POA: Diagnosis present

## 2023-12-24 DIAGNOSIS — N400 Enlarged prostate without lower urinary tract symptoms: Secondary | ICD-10-CM | POA: Diagnosis present

## 2023-12-24 DIAGNOSIS — Z6831 Body mass index (BMI) 31.0-31.9, adult: Secondary | ICD-10-CM | POA: Diagnosis not present

## 2023-12-24 DIAGNOSIS — E8809 Other disorders of plasma-protein metabolism, not elsewhere classified: Secondary | ICD-10-CM | POA: Diagnosis present

## 2023-12-24 DIAGNOSIS — G245 Blepharospasm: Secondary | ICD-10-CM | POA: Diagnosis not present

## 2023-12-24 DIAGNOSIS — G9340 Encephalopathy, unspecified: Secondary | ICD-10-CM | POA: Diagnosis not present

## 2023-12-24 LAB — BLOOD GAS, ARTERIAL
Acid-Base Excess: 0.4 mmol/L (ref 0.0–2.0)
Bicarbonate: 23.1 mmol/L (ref 20.0–28.0)
Drawn by: 59094
O2 Saturation: 98.3 %
Patient temperature: 36.4
pCO2 arterial: 30 mmHg — ABNORMAL LOW (ref 32–48)
pH, Arterial: 7.49 — ABNORMAL HIGH (ref 7.35–7.45)
pO2, Arterial: 75 mmHg — ABNORMAL LOW (ref 83–108)

## 2023-12-24 LAB — GLUCOSE, CAPILLARY
Glucose-Capillary: 150 mg/dL — ABNORMAL HIGH (ref 70–99)
Glucose-Capillary: 138 mg/dL — ABNORMAL HIGH (ref 70–99)
Glucose-Capillary: 228 mg/dL — ABNORMAL HIGH (ref 70–99)
Glucose-Capillary: 184 mg/dL — ABNORMAL HIGH (ref 70–99)
Glucose-Capillary: 205 mg/dL — ABNORMAL HIGH (ref 70–99)

## 2023-12-24 LAB — CBC
HCT: 36.9 % — ABNORMAL LOW (ref 39.0–52.0)
Hemoglobin: 12.2 g/dL — ABNORMAL LOW (ref 13.0–17.0)
MCH: 33.1 pg (ref 26.0–34.0)
MCHC: 33.1 g/dL (ref 30.0–36.0)
MCV: 100 fL (ref 80.0–100.0)
Platelets: 139 K/uL — ABNORMAL LOW (ref 150–400)
RBC: 3.69 MIL/uL — ABNORMAL LOW (ref 4.22–5.81)
RDW: 13.8 % (ref 11.5–15.5)
WBC: 6.1 K/uL (ref 4.0–10.5)
nRBC: 0 % (ref 0.0–0.2)

## 2023-12-24 LAB — BASIC METABOLIC PANEL WITH GFR
Anion gap: 9 (ref 5–15)
BUN: 15 mg/dL (ref 8–23)
CO2: 21 mmol/L — ABNORMAL LOW (ref 22–32)
Calcium: 8.6 mg/dL — ABNORMAL LOW (ref 8.9–10.3)
Chloride: 109 mmol/L (ref 98–111)
Creatinine, Ser: 1.14 mg/dL (ref 0.61–1.24)
GFR, Estimated: >60 mL/min (ref >60)
Glucose, Bld: 161 mg/dL — ABNORMAL HIGH (ref 70–99)
Potassium: 4.2 mmol/L (ref 3.5–5.1)
Sodium: 139 mmol/L (ref 135–145)

## 2023-12-24 NOTE — Plan of Care (Signed)

## 2023-12-24 NOTE — Care Management Obs Status (Signed)
 MEDICARE OBSERVATION STATUS NOTIFICATION   Patient Details  Name: Paul Kent MRN: [id number] Date of Birth: 05-08-1941   Medicare Observation Status Notification Given:       Patient seemed confused called the patient wife and advised of the observation status and advised that a copy will be left in the patients room.    Roby Spalla 12/24/2023, 12:47 PM

## 2023-12-24 NOTE — Plan of Care (Signed)
   Problem: Education: Goal: Ability to describe self-care measures that may prevent or decrease complications (Diabetes Survival Skills Education) will improve Outcome: Not Progressing Goal: Individualized Educational Video(s) Outcome: Not Progressing   Problem: Coping: Goal: Ability to adjust to condition or change in health will improve Outcome: Not Progressing

## 2023-12-24 NOTE — Progress Notes (Signed)
 [facility]                                                                              Paul Kent, is a 83 y.o. male, DOB - 12/15/1940, FMW:[id number] Admit date - 12/22/2023    Outpatient Primary MD for the patient is Cox, Kirsten, MD  LOS - 0  days  No chief complaint on file.      Brief summary   Patient is a 83 year old male with persistent A-fib on Xarelto , chronic HFpEF, SSS s/p PPM, CKD stage IIIb, T2DM, HTN, HLD, hypothyroidism, macrocytic anemia, thrombocytopenia, BPH, chronic blepharospasm, essential tremor, and OSA on CPAP who presented to the ED yesterday (7/5) for evaluation of altered mental status.   Per spouse, patient was in his usual state of health 2 days ago.  At baseline he is walking and talking appropriately.  Usually uses a walker to ambulate.  Has been going to the gym regularly. He has been dealing with spasms of his eyelids for over a year now.  He was recently started on baclofen  10 mg 3 times daily by his PCP for management.  He took about 1-1/2 days worth of the medicine before he had a change in mentation.  Has been confused and lethargic.  Has been slower to respond than normal.  This is quite a significant change from his baseline per spouse.  Assessment & Plan    Acute toxic encephalopathy/fever: - New onset encephalopathy 2 days PTA, has been confused, somnolent, not following commands.  - Recently started on baclofen  which may be primary etiology with decreased clearance in the setting of renal dysfunction.   - Continue to hold gabapentin , primidone , stop baclofen  - Somnolent but somewhat arousable and oriented to self, but not able to hold any conversation, not following any verbal commands. - Infectious workup negative so far, flu, RSV, COVID-negative, UA negative, chest x-ray showed cardiomegaly with vascular congestion but no pneumonia - CT head showed no acute intracranial findings, atrophy and white matter microvascular  disease -Has pacemaker, unable to do MRI - If no significant improvement in mental status in next 24 hours, will consult neurology. - Obtain ABG, to rule out hypercarbia, B1.  Ammonia level on admission normal.  Persistent atrial fibrillation SSS s/p PPM: Paced rhythm on admission. Continue amiodarone  and Xarelto .   Chronic HFpEF: Appears mildly hypovolemic on admission.  Holding torsemide .  Receiving IV fluids as above.   CKD stage IIIb: Renal function actually improved from baseline.  Continue to monitor.   Type 2 diabetes with peripheral neuropathy: -Continue sliding scale insulin   Diabetic neuropathy -Hold gabapentin  due to somnolence   Chronic macrocytic anemia, B12 deficiency: H&H stable, at baseline 11-12  -Continue B12 supplement, 1000 mcg daily    Chronic, mild thrombocytopenia: -Follow counts, no obvious bleeding   Hyperlipidemia: Continue atorvastatin .   Chronic blepharospasm: Holding baclofen  as above.   Hypothyroidism: Continue Synthroid . TSH 2.3, free T41.29   Essential tremor: Holding primidone .   BPH: Continue Flomax .   OSA: Can resume CPAP when mentation improves.   Obesity class I Estimated body mass index is 31.08 kg/m as calculated from the following:  Height as of 12/18/23: 6' 3 (1.905 m).   Weight as of this encounter: 112.8 kg.  Code Status: DNR DVT Prophylaxis:   Rivaroxaban  (XARELTO ) tablet 15 mg   Level of Care: Level of care: Med-Surg Family Communication:  Disposition Plan:      Remains inpatient appropriate:      Procedures:    Consultants:     Antimicrobials:   Anti-infectives (From admission, onward)    None          Medications  amiodarone   200 mg Oral Daily   atorvastatin   10 mg Oral QPM   vitamin B-12  1,000 mcg Oral Daily   insulin  aspart  0-9 Units Subcutaneous TID WC   levothyroxine   137 mcg Oral q morning   Rivaroxaban   15 mg Oral QPM   tamsulosin   0.4 mg Oral QPM      Subjective:    [name] was seen and examined today.  Very somnolent but arousable, oriented to self, mumbling, not able to hold any conversation or follow commands.  Unable to obtain ROS from the patient.  Objective:   Vitals:   12/23/23 2116 12/24/23 0348 12/24/23 0500 12/24/23 0719  BP: (!) 161/84 (!) 162/77  (!) 152/77  Pulse: 62 62  64  Resp: 16 16  16   Temp: 99.1 F (37.3 C) 99 F (37.2 C)  97.6 F (36.4 C)  TempSrc: Oral Oral  Oral  SpO2: 97% 99%  100%  Weight:   112.8 kg     Intake/Output Summary (Last 24 hours) at 12/24/2023 0945 Last data filed at 12/24/2023 0900 Gross per 24 hour  Intake 236 ml  Output 600 ml  Net -364 ml     Wt Readings from Last 3 Encounters:  12/24/23 112.8 kg  12/18/23 112.5 kg  11/27/23 110.2 kg     Exam General: Alert but somewhat arousable, oriented to self, mumbling, not following commands Cardiovascular: S1 S2 auscultated,  RRR Respiratory: Clear to auscultation bilaterally Gastrointestinal: Soft, nontender, nondistended, + bowel sounds Ext: no pedal edema bilaterally Neuro: not following commands Psych: somnolent, confused   Data Reviewed:  I have personally reviewed following labs    CBC Lab Results  Component Value Date   WBC 6.1 12/24/2023   RBC 3.69 (L) 12/24/2023   HGB 12.2 (L) 12/24/2023   HCT 36.9 (L) 12/24/2023   MCV 100.0 12/24/2023   MCH 33.1 12/24/2023   PLT 139 (L) 12/24/2023   MCHC 33.1 12/24/2023   RDW 13.8 12/24/2023   LYMPHSABS 0.8 12/22/2023   MONOABS 0.8 12/22/2023   EOSABS 0.1 12/22/2023   BASOSABS 0.1 12/22/2023     Last metabolic panel Lab Results  Component Value Date   NA 139 12/24/2023   K 4.2 12/24/2023   CL 109 12/24/2023   CO2 21 (L) 12/24/2023   BUN 15 12/24/2023   CREATININE 1.14 12/24/2023   GLUCOSE 161 (H) 12/24/2023   GFRNONAA >60 12/24/2023   GFRAA 36 (L) 08/06/2020   CALCIUM  8.6 (L) 12/24/2023   PROT 6.5 12/22/2023   ALBUMIN 3.4 (L) 12/22/2023   LABGLOB 2.4 12/18/2023    AGRATIO 1.5 11/07/2022   BILITOT 0.7 12/22/2023   ALKPHOS 89 12/22/2023   AST 18 12/22/2023   ALT 15 12/22/2023   ANIONGAP 9 12/24/2023    CBG (last 3)  Recent Labs    12/23/23 2118 12/24/23 0717  GLUCAP 150* 138*      Coagulation Profile: No results for input(s): INR, PROTIME in the last  168 hours.   Radiology Studies: I have personally reviewed the imaging studies  DG Chest Port 1 View Result Date: 12/22/2023 CLINICAL DATA:  Confusion EXAM: PORTABLE CHEST 1 VIEW COMPARISON:  03/15/2023 FINDINGS: Left pacer remains in place, unchanged. Mild cardiomegaly, vascular congestion. No confluent opacities or overt edema. No effusions. No acute bony abnormality. IMPRESSION: Cardiomegaly, vascular congestion. Electronically Signed   By: Franky Crease M.D.   On: 12/22/2023 18:21   CT Head Wo Contrast Result Date: 12/22/2023 CLINICAL DATA:  Mental status change. EXAM: CT HEAD WITHOUT CONTRAST TECHNIQUE: Contiguous axial images were obtained from the base of the skull through the vertex without intravenous contrast. RADIATION DOSE REDUCTION: This exam was performed according to the departmental dose-optimization program which includes automated exposure control, adjustment of the mA and/or kV according to patient size and/or use of iterative reconstruction technique. COMPARISON:  None Available. FINDINGS: Brain: No acute intracranial hemorrhage. No focal mass lesion. No CT evidence of acute infarction. No midline shift or mass effect. No hydrocephalus. Basilar cisterns are patent. There are periventricular and subcortical white matter hypodensities. Generalized cortical atrophy. Vascular: No hyperdense vessel or unexpected calcification. Skull: Normal. Negative for fracture or focal lesion. Sinuses/Orbits: Paranasal sinuses and mastoid air cells are clear. Orbits are clear. Other: None. IMPRESSION: 1. No acute intracranial findings. 2. Atrophy and white matter microvascular disease. Electronically  Signed   By: Jackquline Boxer M.D.   On: 12/22/2023 16:03       Cire Clute M.D. [facility] 12/24/2023, 9:45 AM  Available via Epic secure chat 7am-7pm After 7 pm, please refer to night coverage provider listed on amion.

## 2023-12-24 NOTE — TOC CM/SW Note (Signed)
 Transition of Care [facility]) - Inpatient Brief Assessment   Patient Details  Name: Paul Kent MRN: [id number] Date of Birth: [birth year]  Transition of Care [facility] CM/SW Contact:    Lauraine FORBES Saa, LCSW Phone Number: 12/24/2023, 9:34 AM   Clinical Narrative:  9:34 AM Per chart review, patient resides at home with spouse. Patient has a PCP and insurance. Patient does not have SNF history. Patient has HH history with [facility]. Patient's spouse expressed interest in patient discharging home with [facility] (patient is not currently fully oriented) and expressed preference in [facility] agency. Patient has sleep therapy history with [facility]. Patient's preferred pharmacy is [facility] [place]. Patient's spouse stated that patient has DME at home. TOC will continue to follow.  Transition of Care Asessment: Insurance and Status: Insurance coverage has been reviewed Patient has primary care physician: Yes Home environment has been reviewed: Private Residence Prior level of function:: N/A Prior/Current Home Services: No current home services (Has HH/DME history) Social Drivers of Health Review: SDOH reviewed no interventions necessary Readmission risk has been reviewed: Yes Transition of care needs: transition of care needs identified, TOC will continue to follow

## 2023-12-25 ENCOUNTER — Other Ambulatory Visit: Payer: Self-pay

## 2023-12-25 DIAGNOSIS — R4182 Altered mental status, unspecified: Secondary | ICD-10-CM | POA: Diagnosis not present

## 2023-12-25 DIAGNOSIS — G9340 Encephalopathy, unspecified: Secondary | ICD-10-CM | POA: Diagnosis not present

## 2023-12-25 DIAGNOSIS — G245 Blepharospasm: Secondary | ICD-10-CM | POA: Diagnosis not present

## 2023-12-25 DIAGNOSIS — Z950 Presence of cardiac pacemaker: Secondary | ICD-10-CM | POA: Diagnosis not present

## 2023-12-25 DIAGNOSIS — E038 Other specified hypothyroidism: Secondary | ICD-10-CM

## 2023-12-25 LAB — GLUCOSE, CAPILLARY
Glucose-Capillary: 203 mg/dL — ABNORMAL HIGH (ref 70–99)
Glucose-Capillary: 182 mg/dL — ABNORMAL HIGH (ref 70–99)
Glucose-Capillary: 169 mg/dL — ABNORMAL HIGH (ref 70–99)
Glucose-Capillary: 167 mg/dL — ABNORMAL HIGH (ref 70–99)

## 2023-12-25 LAB — RENAL FUNCTION PANEL
Albumin: 3 g/dL — ABNORMAL LOW (ref 3.5–5.0)
Anion gap: 10 (ref 5–15)
BUN: 19 mg/dL (ref 8–23)
CO2: 19 mmol/L — ABNORMAL LOW (ref 22–32)
Calcium: 8.5 mg/dL — ABNORMAL LOW (ref 8.9–10.3)
Chloride: 107 mmol/L (ref 98–111)
Creatinine, Ser: 1.19 mg/dL (ref 0.61–1.24)
GFR, Estimated: 60 mL/min — ABNORMAL LOW (ref >60)
Glucose, Bld: 215 mg/dL — ABNORMAL HIGH (ref 70–99)
Phosphorus: 2.7 mg/dL (ref 2.5–4.6)
Potassium: 4.1 mmol/L (ref 3.5–5.1)
Sodium: 136 mmol/L (ref 135–145)

## 2023-12-25 LAB — CBC
HCT: 33.9 % — ABNORMAL LOW (ref 39.0–52.0)
Hemoglobin: 11.6 g/dL — ABNORMAL LOW (ref 13.0–17.0)
MCH: 33.6 pg (ref 26.0–34.0)
MCHC: 34.2 g/dL (ref 30.0–36.0)
MCV: 98.3 fL (ref 80.0–100.0)
Platelets: 145 K/uL — ABNORMAL LOW (ref 150–400)
RBC: 3.45 MIL/uL — ABNORMAL LOW (ref 4.22–5.81)
RDW: 13.4 % (ref 11.5–15.5)
WBC: 5.2 K/uL (ref 4.0–10.5)
nRBC: 0 % (ref 0.0–0.2)

## 2023-12-25 MED ORDER — INSULIN ASPART 100 UNIT/ML IJ SOLN
0.0000 [IU] | Freq: Three times a day (TID) | INTRAMUSCULAR | Status: DC
  Administered 2023-12-25 – 2023-12-26 (×3): 3 [IU] via SUBCUTANEOUS
  Administered 2023-12-26: 2 [IU] via SUBCUTANEOUS
  Administered 2023-12-27: 3 [IU] via SUBCUTANEOUS
  Administered 2023-12-27: 2 [IU] via SUBCUTANEOUS

## 2023-12-25 MED ORDER — MELATONIN 5 MG PO TABS
5.0000 mg | ORAL_TABLET | Freq: Every evening | ORAL | Status: DC | PRN
  Administered 2023-12-25 – 2023-12-26 (×2): 5 mg via ORAL
  Filled 2023-12-25 (×2): qty 1

## 2023-12-25 MED ORDER — INSULIN GLARGINE-YFGN 100 UNIT/ML ~~LOC~~ SOLN
5.0000 [IU] | Freq: Every day | SUBCUTANEOUS | Status: DC
  Administered 2023-12-25 – 2023-12-27 (×3): 5 [IU] via SUBCUTANEOUS
  Filled 2023-12-25 (×3): qty 0.05

## 2023-12-25 MED ORDER — INSULIN ASPART 100 UNIT/ML IJ SOLN: 0.0000 [IU] | Freq: Every day | INTRAMUSCULAR | Status: DC

## 2023-12-25 NOTE — Evaluation (Signed)
 Physical Therapy Evaluation  Patient Details Name: Paul Kent MRN: [id number] DOB: 04-28-1941 Today's Date: 12/25/2023  History of Present Illness  Patient is a 83 year old male who presented to the ED 7/5 with AMS. PMH: A-fib on Xarelto , chronic HFpEF, SSS s/p PPM, CKD stage IIIb, T2DM, HTN, HLD, hypothyroidism, macrocytic anemia, thrombocytopenia, BPH, chronic blepharospasm, essential tremor, and OSA on CPAP   Clinical Impression  Pt admitted with above. PTA pt was amb with RW, driving, indep with ADLs, and going to the gym 3x/wk. Pt lives with spouse in 1 level home. Pt currently with noted confusion, impulsivity, impaired balance, increased falls risk, and is requiring mod/maxA for transfers and is unable to ambulate with RW due to instability and difficulty sequencing stepping pattern. At this time recommending inpatient rehab program < 3 hrs a day to allow for increased time to achieve safe mod I level of function for safe transition home with spouse. Acute PT to cont to follow.      If plan is discharge home, recommend the following: A lot of help with walking and/or transfers;A lot of help with bathing/dressing/bathroom;Supervision due to cognitive status   Can travel by private vehicle   No    Equipment Recommendations None recommended by PT  Recommendations for Other Services       Functional Status Assessment Patient has had a recent decline in their functional status and/or demonstrates limited ability to make significant improvements in function in a reasonable and predictable amount of time     Precautions / Restrictions Precautions Precautions: Fall Restrictions Weight Bearing Restrictions Per Provider Order: No      Mobility  Bed Mobility Overal bed mobility: Needs Assistance Bed Mobility: Supine to Sit     Supine to sit: Max assist     General bed mobility comments: pt trying to initiate LEs out of the bed between side rail and foot of bed, max  directional verbal cues, pt requiring maxA for trunk elevation to sit EOB and to scoot hips to EOB for feet to touch floor,pt with posterior bias once at EOB    Transfers Overall transfer level: Needs assistance Equipment used: Rolling walker (2 wheels) Transfers: Sit to/from Stand, Bed to chair/wheelchair/BSC Sit to Stand: Mod assist   Step pivot transfers: Max assist       General transfer comment: modA to power up from EOB, pt with posterior bias, maxA for walker management and sequencing of stepping during step pvt to chair, pt sat prematurely despite max verbal cues requiring max tactile cues to sit on chair    Ambulation/Gait               General Gait Details: unable to this date, noted ataxia vs impaired coordination vs impaired stepping sequencing during step pvt to chair  Stairs            Wheelchair Mobility     Tilt Bed    Modified Rankin (Stroke Patients Only)       Balance Overall balance assessment: Needs assistance Sitting-balance support: Feet supported, Bilateral upper extremity supported Sitting balance-Leahy Scale: Poor Sitting balance - Comments: posterior lean, had to hold onto foot rail and bed rail   Standing balance support: Bilateral upper extremity supported Standing balance-Leahy Scale: Zero Standing balance comment: reliant on external support                             Pertinent Vitals/Pain Pain Assessment Pain Assessment:  No/denies pain    Home Living Family/patient expects to be discharged to:: Private residence Living Arrangements: Spouse/significant other Available Help at Discharge: Family;Available 24 hours/day Type of Home: House Home Access: Level entry       Home Layout: One level Home Equipment: Agricultural consultant (2 wheels);Shower seat Additional Comments: pt's spouse validated patients accuracy of house set up despite noted confusion    Prior Function Prior Level of Function :  Independent/Modified Independent             Mobility Comments: used a RW, drove, went to the gym 3x/week ADLs Comments: indep     Extremity/Trunk Assessment   Upper Extremity Assessment Upper Extremity Assessment: Defer to OT evaluation    Lower Extremity Assessment Lower Extremity Assessment: Generalized weakness (pt with L residual weakness from previous injury. pt with noted co-ordination deficits bilaterally)       Communication   Communication Communication: No apparent difficulties    Cognition Arousal: Alert Behavior During Therapy: Impulsive   PT - Cognitive impairments: Awareness, Memory, Orientation, Problem solving, Safety/Judgement   Orientation impairments: Time, Situation                   PT - Cognition Comments: pt repeatedly stating We need to go home today.  we can't do therapy today because we don't have transportation. pt trying to get up OOB, once assisted to chair pt less irritated and more responsive to cues from PT and spouse Following commands: Impaired Following commands impaired: Follows one step commands inconsistently     Cueing Cueing Techniques: Verbal cues, Tactile cues     General Comments General comments (skin integrity, edema, etc.): VSS    Exercises     Assessment/Plan    PT Assessment Patient needs continued PT services  PT Problem List Decreased strength;Decreased activity tolerance;Decreased balance;Decreased mobility;Decreased coordination;Decreased cognition;Decreased knowledge of use of DME;Decreased safety awareness       PT Treatment Interventions DME instruction;Gait training;Functional mobility training;Therapeutic activities;Therapeutic exercise;Balance training    PT Goals (Current goals can be found in the Care Plan section)  Acute Rehab PT Goals Patient Stated Goal: home today PT Goal Formulation: With patient/family Time For Goal Achievement: 01/08/24 Potential to Achieve Goals: Good     Frequency Min 3X/week     Co-evaluation               AM-PAC PT 6 Clicks Mobility  Outcome Measure Help needed turning from your back to your side while in a flat bed without using bedrails?: A Lot Help needed moving from lying on your back to sitting on the side of a flat bed without using bedrails?: A Lot Help needed moving to and from a bed to a chair (including a wheelchair)?: A Lot Help needed standing up from a chair using your arms (e.g., wheelchair or bedside chair)?: A Lot Help needed to walk in hospital room?: Total Help needed climbing 3-5 steps with a railing? : Total 6 Click Score: 10    End of Session Equipment Utilized During Treatment: Gait belt Activity Tolerance: Patient tolerated treatment well Patient left: in chair;with call bell/phone within reach;with chair alarm set;with family/visitor present Nurse Communication: Mobility status (confusion) PT Visit Diagnosis: Unsteadiness on feet (R26.81);Muscle weakness (generalized) (M62.81);Difficulty in walking, not elsewhere classified (R26.2)    Time: 8771-8747 PT Time Calculation (min) (ACUTE ONLY): 24 min   Charges:   PT Evaluation $PT Eval Moderate Complexity: 1 Mod   PT General Charges $$ ACUTE PT VISIT:  1 Visit         Norene Ames, PT, DPT Acute Rehabilitation Services Secure chat preferred Office #: [phone]   Norene CHRISTELLA Ames 12/25/2023, 2:03 PM

## 2023-12-25 NOTE — NC FL2 (Signed)
 English  MEDICAID FL2 LEVEL OF CARE FORM     IDENTIFICATION  Patient Name: Paul Kent Birthdate: Apr 22, 1941 Sex: male Admission Date (Current Location): 12/22/2023  [facility] and [place] Number:  Producer, television/film/video and Address:  [facility], [address], [place], [place] [address]      Provider Number: [id number]  Attending Physician Name and Address:  Davia Nydia POUR, MD  Relative Name and Phone Number:  Giancarlos Berendt; Spouse; [phone]    Current Level of Care: Hospital Recommended Level of Care: Skilled Nursing Facility Prior Approval Number:    Date Approved/Denied:   PASRR Number: [id number]  Discharge Plan: SNF    Current Diagnoses: Patient Active Problem List   Diagnosis Date Noted   Acute encephalopathy 12/23/2023   Chronic heart failure with preserved ejection fraction (HFpEF, >= 50%) (HCC) 12/23/2023   Macrocytic anemia 12/23/2023   Thrombocytopenia (HCC) 12/23/2023   Memory loss 12/20/2023   Blepharospasm of both eyes 09/14/2023   Presbyopia 09/13/2023   Encounter for Medicare annual wellness exam 06/09/2023   Blepharospasm 06/02/2023   Skin tear of left hand without complication 03/15/2023   History of recent fall 02/06/2023   Pedal edema 01/11/2023   Persistent atrial fibrillation (HCC) 12/15/2022   Ptosis of both upper eyelids 05/30/2022   Entropion of both eyes 05/30/2022   Hypertensive heart and renal disease, stage 1-4 or unspecified chronic kidney disease, with heart failure (HCC) 07/31/2021   Enlarged prostate with urinary obstruction 07/31/2021   Stage 3b chronic kidney disease (HCC) 04/26/2021   Gastroesophageal reflux disease without esophagitis 04/26/2021   Polyuria 04/26/2021   Type 2 diabetes mellitus with peripheral neuropathy (HCC) 04/26/2021   Hypercoagulable state due to persistent atrial fibrillation (HCC) 04/26/2021   Status post ablation of atrial fibrillation 09/16/2020   Primary insomnia     Male erectile disorder    Hyperlipidemia associated with type 2 diabetes mellitus (HCC)    History of bladder cancer    Arthralgia of left temporomandibular joint    Acquired bilateral hammer toes    Congestive heart failure (CHF) (HCC) 02/2018   Hypothyroidism 03/07/2017   Pacemaker reprogramming/check 02/12/2017   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   OSA on CPAP 01/17/2017   Sick sinus syndrome (HCC) 01/17/2017   Paroxysmal atrial fibrillation (HCC) 05/2016   Essential tremor 06/03/2013   Herpes zoster with nervous system complication 06/03/2013   Lesion of femoral nerve 04/30/2013    Orientation RESPIRATION BLADDER Height & Weight     Self  Normal (Room Air) Incontinent Weight: 249 lb 5.4 oz (113.1 kg) Height:     BEHAVIORAL SYMPTOMS/MOOD NEUROLOGICAL BOWEL NUTRITION STATUS      Continent Diet (Please see discharge summary)  AMBULATORY STATUS COMMUNICATION OF NEEDS Skin   Extensive Assist Verbally Normal                       Personal Care Assistance Level of Assistance  Bathing, Feeding, Dressing Bathing Assistance: Maximum assistance Feeding assistance: Maximum assistance Dressing Assistance: Maximum assistance     Functional Limitations Info             SPECIAL CARE FACTORS FREQUENCY  PT (By licensed PT), OT (By licensed OT)     PT Frequency: 5x OT Frequency: 5x            Contractures Contractures Info: Not present    Additional Factors Info  Code Status, Insulin  Sliding Scale, Allergies Code Status Info:  Do not attempt resuscitation (DNR) PRE-ARREST INTERVENTIONS DESIRED Allergies Info: Propranolol; Canagliflozin; Clonidine; Derivatives; Hydralazine  Hcl; Metformin And Related; Topiramate; Ambien  (Zolpidem ); Farxiga  (Dapagliflozin ); Ozempic  (0.25 Or 0.5 Mg-dose) (Semaglutide (0.25 Or 0.5mg -dos))   Insulin  Sliding Scale Info: Please see discharge summary       Current Medications (12/25/2023):  This is the current hospital active medication  list Current Facility-Administered Medications  Medication Dose Route Frequency Provider Last Rate Last Admin   acetaminophen  (TYLENOL ) tablet 650 mg  650 mg Oral Q6H PRN Patel, Vishal R, MD       Or   acetaminophen  (TYLENOL ) suppository 650 mg  650 mg Rectal Q6H PRN Patel, Vishal R, MD       amiodarone  (PACERONE ) tablet 200 mg  200 mg Oral Daily Zackowski, Scott, MD   200 mg at 12/25/23 0933   atorvastatin  (LIPITOR) tablet 10 mg  10 mg Oral QPM Zackowski, Scott, MD   10 mg at 12/25/23 1754   bisacodyl  (DULCOLAX) EC tablet 5 mg  5 mg Oral Daily PRN Patel, Vishal R, MD       cyanocobalamin  (VITAMIN B12) tablet 1,000 mcg  1,000 mcg Oral Daily Patel, Vishal R, MD   1,000 mcg at 12/25/23 0932   insulin  aspart (novoLOG ) injection 0-15 Units  0-15 Units Subcutaneous TID WC Rai, Ripudeep K, MD   3 Units at 12/25/23 1755   insulin  aspart (novoLOG ) injection 0-5 Units  0-5 Units Subcutaneous QHS Rai, Ripudeep K, MD       insulin  glargine-yfgn (SEMGLEE ) injection 5 Units  5 Units Subcutaneous Daily Rai, Ripudeep K, MD   5 Units at 12/25/23 1400   levothyroxine  (SYNTHROID ) tablet 137 mcg  137 mcg Oral q morning Zackowski, Scott, MD   137 mcg at 12/25/23 9353   ondansetron  (ZOFRAN ) tablet 4 mg  4 mg Oral Q6H PRN Patel, Vishal R, MD       Or   ondansetron  (ZOFRAN ) injection 4 mg  4 mg Intravenous Q6H PRN Patel, Vishal R, MD       Rivaroxaban  (XARELTO ) tablet 15 mg  15 mg Oral QPM Zackowski, Scott, MD   15 mg at 12/25/23 1757   senna-docusate (Senokot-S) tablet 1 tablet  1 tablet Oral QHS PRN Patel, Vishal R, MD       tamsulosin  (FLOMAX ) capsule 0.4 mg  0.4 mg Oral QPM Zackowski, Scott, MD   0.4 mg at 12/25/23 1754     Discharge Medications: Please see discharge summary for a list of discharge medications.  Relevant Imaging Results:  Relevant Lab Results:   Additional Information SS# [id number]  Lauraine FORBES Saa, LCSW

## 2023-12-25 NOTE — Progress Notes (Signed)
 OT Cancellation Note  Patient Details Name: ERYN KREJCI MRN: [id number] DOB: Nov 15, 1940   Cancelled Treatment:    Reason Eval/Treat Not Completed: Other (comment).  Patient already returned to bed, will continue efforts another date.    Vidit Boissonneault D Koden Hunzeker 12/25/2023, 2:18 PM 12/25/2023  RP, OTR/L  Acute Rehabilitation Services  Office:  [phone]

## 2023-12-25 NOTE — Plan of Care (Signed)

## 2023-12-25 NOTE — Progress Notes (Signed)
 [facility]                                                                              Paul Kent, is a 83 y.o. male, DOB - 1940/08/04, FMW:[id number] Admit date - 12/22/2023    Outpatient Primary MD for the patient is Cox, Abigail, MD  LOS - 1  days  No chief complaint on file.      Brief summary   Patient is a 83 year old male with persistent A-fib on Xarelto , chronic HFpEF, SSS s/p PPM, CKD stage IIIb, T2DM, HTN, HLD, hypothyroidism, macrocytic anemia, thrombocytopenia, BPH, chronic blepharospasm, essential tremor, and OSA on CPAP who presented to the ED yesterday (7/5) for evaluation of altered mental status.   Per spouse, patient was in his usual state of health 2 days ago.  At baseline he is walking and talking appropriately.  Usually uses a Paul to ambulate.  Has been going to the gym regularly. He has been dealing with spasms of his eyelids for over a year now.  He was recently started on baclofen  10 mg 3 times daily by his PCP for management.  He took about 1-1/2 days worth of the medicine before he had a change in mentation.  Has been confused and lethargic.  Has been slower to respond than normal.  This is quite a significant change from his baseline per spouse.  Assessment & Plan    Acute toxic encephalopathy/fever: - New onset encephalopathy 2 days PTA, has been confused, somnolent, not following commands.  Recently started on baclofen  which may be primary etiology with decreased clearance in the setting of renal dysfunction.   - Continue to hold gabapentin , primidone .  Baclofen  discontinued. - Infectious workup negative so far, flu, RSV, COVID-negative, UA negative, chest x-ray showed cardiomegaly with vascular congestion but no pneumonia - CT head showed no acute intracranial findings, atrophy and white matter microvascular disease. - Ammonia level normal, ABG with no hypercarbia -Today much more alert and oriented, appearing close to his baseline,  start PT OT  Persistent atrial fibrillation SSS s/p PPM: Paced rhythm on admission. Continue amiodarone  and Xarelto .   Chronic HFpEF: Appears mildly hypovolemic on admission.  Holding torsemide .  DC IV fluids, encourage p.o. diet   CKD stage IIIb: Renal function actually improved from baseline.  Continue to monitor.   Type 2 diabetes with peripheral neuropathy, uncontrolled with hyper glycemia: -CBG (last 3)  Recent Labs    12/24/23 2046 12/25/23 0728 12/25/23 1155  GLUCAP 205* 203* 182*  Hemoglobin A1c 6.4 on 12/18/2023 Increased SSI to moderate, added Semglee  5 units daily   Diabetic neuropathy -Hold gabapentin    Chronic macrocytic anemia, B12 deficiency: H&H stable, at baseline 11-12  -Continue B12 supplement, 1000 mcg daily    Chronic, mild thrombocytopenia: -Follow counts, no obvious bleeding   Hyperlipidemia: Continue atorvastatin .   Chronic blepharospasm: Baclofen  discontinued..  Recommend outpatient ambulatory referral to neurology.   Hypothyroidism: Continue Synthroid . TSH 2.3, free T41.29   Essential tremor: Holding primidone .   BPH: Continue Flomax .   OSA: Can resume CPAP when mentation improves.   Obesity class I Estimated body mass index is 31.17 kg/m  as calculated from the following:   Height as of 12/18/23: 6' 3 (1.905 m).   Weight as of this encounter: 113.1 kg.  Code Status: DNR DVT Prophylaxis:   Rivaroxaban  (XARELTO ) tablet 15 mg   Level of Care: Level of care: Med-Surg Family Communication: Updated patient's wife, Mrs Paul Kent on the phone. Disposition Plan:      Remains inpatient appropriate:   Possible discharge home tomorrow, PT OT evaluation today   Procedures:    Consultants:     Antimicrobials:   Anti-infectives (From admission, onward)    None          Medications  amiodarone   200 mg Oral Daily   atorvastatin   10 mg Oral QPM   vitamin B-12  1,000 mcg Oral Daily   insulin  aspart  0-9 Units  Subcutaneous TID WC   levothyroxine   137 mcg Oral q morning   Rivaroxaban   15 mg Oral QPM   tamsulosin   0.4 mg Oral QPM      Subjective:   Paul Kent was seen and examined today.  Much more alert and oriented today, asking why he is in the hospital.  No acute nausea vomiting, chest pain or shortness of breath, fevers or chills.   Objective:   Vitals:   12/24/23 2023 12/25/23 0225 12/25/23 0533 12/25/23 0727  BP: (!) 170/81  (!) 138/92 (!) 159/93  Pulse: 87  76 72  Resp: 17  18   Temp: 98.8 F (37.1 C)  98.8 F (37.1 C) 98 F (36.7 C)  TempSrc: Oral  Oral   SpO2: 99%  98% 97%  Weight:  113.1 kg      Intake/Output Summary (Last 24 hours) at 12/25/2023 1203 Last data filed at 12/25/2023 0534 Gross per 24 hour  Intake 118 ml  Output 1100 ml  Net -982 ml     Wt Readings from Last 3 Encounters:  12/25/23 113.1 kg  12/18/23 112.5 kg  11/27/23 110.2 kg   Physical Exam General: Alert and oriented, improving Cardiovascular: S1 S2 clear, RRR.  Respiratory: CTAB, no wheezing Gastrointestinal: Soft, nontender, nondistended, NBS Ext: no pedal edema bilaterally Neuro: no new deficits Psych: Normal affect, much more alert and oriented today   Data Reviewed:  I have personally reviewed following labs    CBC Lab Results  Component Value Date   WBC 5.2 12/25/2023   RBC 3.45 (L) 12/25/2023   HGB 11.6 (L) 12/25/2023   HCT 33.9 (L) 12/25/2023   MCV 98.3 12/25/2023   MCH 33.6 12/25/2023   PLT 145 (L) 12/25/2023   MCHC 34.2 12/25/2023   RDW 13.4 12/25/2023   LYMPHSABS 0.8 12/22/2023   MONOABS 0.8 12/22/2023   EOSABS 0.1 12/22/2023   BASOSABS 0.1 12/22/2023     Last metabolic panel Lab Results  Component Value Date   NA 136 12/25/2023   K 4.1 12/25/2023   CL 107 12/25/2023   CO2 19 (L) 12/25/2023   BUN 19 12/25/2023   CREATININE 1.19 12/25/2023   GLUCOSE 215 (H) 12/25/2023   GFRNONAA 60 (L) 12/25/2023   GFRAA 36 (L) 08/06/2020   CALCIUM  8.5 (L) 12/25/2023    PHOS 2.7 12/25/2023   PROT 6.5 12/22/2023   ALBUMIN 3.0 (L) 12/25/2023   LABGLOB 2.4 12/18/2023   AGRATIO 1.5 11/07/2022   BILITOT 0.7 12/22/2023   ALKPHOS 89 12/22/2023   AST 18 12/22/2023   ALT 15 12/22/2023   ANIONGAP 10 12/25/2023    CBG (last 3)  Recent Labs  12/24/23 2046 12/25/23 0728 12/25/23 1155  GLUCAP 205* 203* 182*      Coagulation Profile: No results for input(s): INR, PROTIME in the last 168 hours.   Radiology Studies: I have personally reviewed the imaging studies  No results found.      Nydia Distance M.D. [facility] 12/25/2023, 12:03 PM  Available via Epic secure chat 7am-7pm After 7 pm, please refer to night coverage provider listed on amion.

## 2023-12-25 NOTE — TOC Initial Note (Addendum)
 Transition of Care [facility]) - Initial/Assessment Note    Patient Details  Name: Paul Kent MRN: [id number] Date of Birth: [birth year]  Transition of Care [facility]) CM/SW Contact:    Lauraine FORBES Saa, LCSW Phone Number: 12/25/2023, 4:53 PM  Clinical Narrative:                  4:55 PM Per chart review, physical therapy altered recommendation from [facility] to SNF upon recent evaluation. CSW attempted to contact patient's spouse, Ginnie, to discuss SNF (patient is not fully oriented). Sally's phone number on patient's chart directed CSW to patient's son, Garnette. CSW relayed information to Garnette. Garnette stated he would request Ginnie to call CSW. CSW provided [employer].  5:41 PM CSW spoke with patient's spouse, Ginnie. Ginnie was agreeable with physical therapy's recommendation of patient discharging to SNF and consented CSW to send referral's to SNFs in [facility]. Ginnie expressed preference in [employer] and stated that she would rather patient discharging home with [facility] vs SNF if patient could not admit to [facility] [place].  Expected Discharge Plan: Skilled Nursing Facility Barriers to Discharge: Continued Medical Work up, SNF Pending bed offer, Insurance Authorization   Patient Goals and CMS Choice            Expected Discharge Plan and Services In-house Referral: Clinical Social Work   Post Acute Care Choice: Skilled Nursing Facility Living arrangements for the past 2 months: Single Family Home                                      Prior Living Arrangements/Services Living arrangements for the past 2 months: Single Family Home Lives with:: Spouse Patient language and need for interpreter reviewed:: Yes        Need for Family Participation in Patient Care: Yes (Comment) Care giver support system in place?: Yes (comment)   Criminal Activity/Legal Involvement Pertinent to Current Situation/Hospitalization: No - Comment as needed  Activities of Daily  Living      Permission Sought/Granted Permission sought to share information with : Family Supports Permission granted to share information with : No (Contact information on chart)  Share Information with NAME: Kyshawn Teal  Permission granted to share info w AGENCY: SNF  Permission granted to share info w Relationship: Spouse  Permission granted to share info w Contact Information: [phone]  Emotional Assessment Appearance:: Appears stated age Attitude/Demeanor/Rapport: Unable to Assess Affect (typically observed): Unable to Assess Orientation: : Oriented to Self Alcohol / Substance Use: Not Applicable Psych Involvement: No (comment)  Admission diagnosis:  Acute encephalopathy [G93.40] Altered mental status, unspecified altered mental status type [R41.82] Patient Active Problem List   Diagnosis Date Noted   Acute encephalopathy 12/23/2023   Chronic heart failure with preserved ejection fraction (HFpEF, >= 50%) (HCC) 12/23/2023   Macrocytic anemia 12/23/2023   Thrombocytopenia (HCC) 12/23/2023   Memory loss 12/20/2023   Blepharospasm of both eyes 09/14/2023   Presbyopia 09/13/2023   Encounter for Medicare annual wellness exam 06/09/2023   Blepharospasm 06/02/2023   Skin tear of left hand without complication 03/15/2023   History of recent fall 02/06/2023   Pedal edema 01/11/2023   Persistent atrial fibrillation (HCC) 12/15/2022   Ptosis of both upper eyelids 05/30/2022   Entropion of both eyes 05/30/2022   Hypertensive heart and renal disease, stage 1-4 or unspecified chronic kidney disease, with heart failure (HCC) 07/31/2021   Enlarged prostate  with urinary obstruction 07/31/2021   Stage 3b chronic kidney disease (HCC) 04/26/2021   Gastroesophageal reflux disease without esophagitis 04/26/2021   Polyuria 04/26/2021   Type 2 diabetes mellitus with peripheral neuropathy (HCC) 04/26/2021   Hypercoagulable state due to persistent atrial fibrillation (HCC) 04/26/2021    Status post ablation of atrial fibrillation 09/16/2020   Primary insomnia    Male erectile disorder    Hyperlipidemia associated with type 2 diabetes mellitus (HCC)    History of bladder cancer    Arthralgia of left temporomandibular joint    Acquired bilateral hammer toes    Congestive heart failure (CHF) (HCC) 02/2018   Hypothyroidism 03/07/2017   Pacemaker reprogramming/check 02/12/2017   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   OSA on CPAP 01/17/2017   Sick sinus syndrome (HCC) 01/17/2017   Paroxysmal atrial fibrillation (HCC) 05/2016   Essential tremor 06/03/2013   Herpes zoster with nervous system complication 06/03/2013   Lesion of femoral nerve 04/30/2013   PCP:  Sherre Clapper, MD Pharmacy:   [facility] [place] [facility], [place] - [address] [place] [address] Phone: [phone] Fax: [phone]     Social Drivers of Health (SDOH) Social History: SDOH Screenings   Food Insecurity: No Food Insecurity (12/24/2023)  Housing: Unknown (12/24/2023)  Transportation Needs: No Transportation Needs (12/24/2023)  Utilities: Not At Risk (12/24/2023)  Alcohol Screen: Low Risk  (05/30/2023)  Depression (PHQ2-9): Low Risk  (06/07/2023)  Financial Resource Strain: Low Risk  (05/30/2023)  Physical Activity: Sufficiently Active (05/30/2023)  Social Connections: Moderately Isolated (12/24/2023)  Stress: No Stress Concern Present (05/30/2023)  Tobacco Use: Low Risk  (12/18/2023)  Health Literacy: Adequate Health Literacy (05/30/2023)   SDOH Interventions:     Readmission Risk Interventions     No data to display

## 2023-12-26 ENCOUNTER — Encounter (HOSPITAL_COMMUNITY): Payer: Self-pay | Admitting: Internal Medicine

## 2023-12-26 DIAGNOSIS — D696 Thrombocytopenia, unspecified: Secondary | ICD-10-CM

## 2023-12-26 DIAGNOSIS — G250 Essential tremor: Secondary | ICD-10-CM | POA: Diagnosis not present

## 2023-12-26 DIAGNOSIS — N1832 Chronic kidney disease, stage 3b: Secondary | ICD-10-CM

## 2023-12-26 DIAGNOSIS — G9340 Encephalopathy, unspecified: Secondary | ICD-10-CM | POA: Diagnosis not present

## 2023-12-26 DIAGNOSIS — G245 Blepharospasm: Secondary | ICD-10-CM | POA: Diagnosis not present

## 2023-12-26 DIAGNOSIS — I4819 Other persistent atrial fibrillation: Secondary | ICD-10-CM

## 2023-12-26 DIAGNOSIS — E039 Hypothyroidism, unspecified: Secondary | ICD-10-CM

## 2023-12-26 DIAGNOSIS — I5032 Chronic diastolic (congestive) heart failure: Secondary | ICD-10-CM | POA: Diagnosis not present

## 2023-12-26 DIAGNOSIS — E1142 Type 2 diabetes mellitus with diabetic polyneuropathy: Secondary | ICD-10-CM

## 2023-12-26 LAB — RENAL FUNCTION PANEL
Albumin: 2.8 g/dL — ABNORMAL LOW (ref 3.5–5.0)
Anion gap: 5 (ref 5–15)
BUN: 24 mg/dL — ABNORMAL HIGH (ref 8–23)
CO2: 23 mmol/L (ref 22–32)
Calcium: 8.1 mg/dL — ABNORMAL LOW (ref 8.9–10.3)
Chloride: 106 mmol/L (ref 98–111)
Creatinine, Ser: 1.39 mg/dL — ABNORMAL HIGH (ref 0.61–1.24)
GFR, Estimated: 50 mL/min — ABNORMAL LOW (ref >60)
Glucose, Bld: 172 mg/dL — ABNORMAL HIGH (ref 70–99)
Phosphorus: 4.1 mg/dL (ref 2.5–4.6)
Potassium: 3.9 mmol/L (ref 3.5–5.1)
Sodium: 134 mmol/L — ABNORMAL LOW (ref 135–145)

## 2023-12-26 LAB — GLUCOSE, CAPILLARY
Glucose-Capillary: 159 mg/dL — ABNORMAL HIGH (ref 70–99)
Glucose-Capillary: 167 mg/dL — ABNORMAL HIGH (ref 70–99)
Glucose-Capillary: 145 mg/dL — ABNORMAL HIGH (ref 70–99)
Glucose-Capillary: 174 mg/dL — ABNORMAL HIGH (ref 70–99)

## 2023-12-26 LAB — CBC
HCT: 33.4 % — ABNORMAL LOW (ref 39.0–52.0)
Hemoglobin: 11.4 g/dL — ABNORMAL LOW (ref 13.0–17.0)
MCH: 33.7 pg (ref 26.0–34.0)
MCHC: 34.1 g/dL (ref 30.0–36.0)
MCV: 98.8 fL (ref 80.0–100.0)
Platelets: 141 K/uL — ABNORMAL LOW (ref 150–400)
RBC: 3.38 MIL/uL — ABNORMAL LOW (ref 4.22–5.81)
RDW: 13.6 % (ref 11.5–15.5)
WBC: 5.1 K/uL (ref 4.0–10.5)
nRBC: 0 % (ref 0.0–0.2)

## 2023-12-26 LAB — VITAMIN B1: Vitamin B1 (Thiamine): 105.8 nmol/L (ref 66.5–200.0)

## 2023-12-26 NOTE — Evaluation (Signed)
 Occupational Therapy Evaluation Patient Details Name: Paul Kent MRN: [id number] DOB: [birth year] Today's Date: 12/26/2023   History of Present Illness   Patient is a 83 year old male who presented to the ED 7/5 with AMS. PMH: A-fib on Xarelto , chronic HFpEF, SSS s/p PPM, CKD stage IIIb, T2DM, HTN, HLD, hypothyroidism, macrocytic anemia, thrombocytopenia, BPH, chronic blepharospasm, essential tremor, and OSA on CPAP     Clinical Impressions Pt ambulated with RW, independent in self care and driving to the gym prior to admission. Presents with impaired cognition; decreased awareness of deficits and situation. Pt unaware of plan to discharge to rehab prior to returning home. Pt with generalized weakness, decreased standing balance and photophobia. He stood from recliner with with light mod assist and posterior bias and walked with min assist. Pt needs set up to moderate assistance for ADLs. Patient will benefit from continued inpatient follow up therapy, <3 hours/day. Will follow acutely.      If plan is discharge home, recommend the following:   A little help with walking and/or transfers;A lot of help with bathing/dressing/bathroom;Assistance with cooking/housework;Direct supervision/assist for medications management;Direct supervision/assist for financial management;Assist for transportation;Help with stairs or ramp for entrance     Functional Status Assessment   Patient has had a recent decline in their functional status and demonstrates the ability to make significant improvements in function in a reasonable and predictable amount of time.     Equipment Recommendations   None recommended by OT     Recommendations for Other Services         Precautions/Restrictions   Precautions Precautions: Fall Recall of Precautions/Restrictions: Impaired Restrictions Weight Bearing Restrictions Per Provider Order: No     Mobility Bed Mobility               General  bed mobility comments: in chair    Transfers Overall transfer level: Needs assistance Equipment used: Rolling walker (2 wheels) Transfers: Sit to/from Stand Sit to Stand: Mod assist           General transfer comment: light mod assist to rise and steady, posterior bias      Balance Overall balance assessment: Needs assistance   Sitting balance-Leahy Scale: Good Sitting balance - Comments: no LOB donning socks   Standing balance support: Bilateral upper extremity supported Standing balance-Leahy Scale: Poor Standing balance comment: min assist and RW                           ADL either performed or assessed with clinical judgement   ADL Overall ADL's : Needs assistance/impaired Eating/Feeding: Set up;Sitting   Grooming: Minimal assistance;Standing;Wash/dry hands   Upper Body Bathing: Minimal assistance;Sitting   Lower Body Bathing: Moderate assistance;Sit to/from stand   Upper Body Dressing : Set up;Sitting   Lower Body Dressing: Moderate assistance;Sit to/from stand   Toilet Transfer: Minimal assistance;Ambulation;Rolling walker (2 wheels)   Toileting- Clothing Manipulation and Hygiene: Moderate assistance;Sit to/from stand       Functional mobility during ADLs: Minimal assistance;Rolling walker (2 wheels)       Vision Baseline Vision/History: 1 Wears glasses Patient Visual Report: No change from baseline Additional Comments: photophobia     Perception         Praxis         Pertinent Vitals/Pain Pain Assessment Pain Assessment: No/denies pain     Extremity/Trunk Assessment Upper Extremity Assessment Upper Extremity Assessment: Overall WFL for tasks assessed   Lower Extremity Assessment Lower Extremity  Assessment: Defer to PT evaluation   Cervical / Trunk Assessment Cervical / Trunk Assessment: Kyphotic   Communication Communication Communication: No apparent difficulties   Cognition Arousal: Alert Behavior During Therapy:  WFL for tasks assessed/performed Cognition: No family/caregiver present to determine baseline             OT - Cognition Comments: decreased insight into deficits, unaware of situation                 Following commands: Impaired Following commands impaired: Follows one step commands with increased time     Cueing  General Comments   Cueing Techniques: Verbal cues;Tactile cues  VSS   Exercises     Shoulder Instructions      Home Living Family/patient expects to be discharged to:: Skilled nursing facility Living Arrangements: Spouse/significant other Available Help at Discharge: Family;Available 24 hours/day Type of Home: House Home Access: Level entry     Home Layout: One level     Bathroom Shower/Tub: Producer, television/film/video: Standard     Home Equipment: Agricultural consultant (2 wheels);Shower seat;Grab bars - tub/shower          Prior Functioning/Environment Prior Level of Function : Independent/Modified Independent;Driving             Mobility Comments: used a RW, drove, went to the gym 3x/week ADLs Comments: indep, stands to shower, wife manages the home    OT Problem List: Decreased strength;Decreased activity tolerance;Impaired balance (sitting and/or standing);Decreased knowledge of use of DME or AE;Decreased safety awareness;Decreased cognition   OT Treatment/Interventions: Self-care/ADL training;Cognitive remediation/compensation;Patient/family education;Balance training      OT Goals(Current goals can be found in the care plan section)   Acute Rehab OT Goals OT Goal Formulation: With patient Time For Goal Achievement: 01/09/24 ADL Goals Pt Will Perform Grooming: with supervision;standing Pt Will Perform Lower Body Bathing: with supervision;sit to/from stand Pt Will Perform Lower Body Dressing: with supervision;sit to/from stand Pt Will Transfer to Toilet: with supervision;ambulating;bedside commode Pt Will Perform Toileting  - Clothing Manipulation and hygiene: with supervision;sit to/from stand   OT Frequency:       Co-evaluation              AM-PAC OT 6 Clicks Daily Activity     Outcome Measure Help from another person eating meals?: None Help from another person taking care of personal grooming?: A Little Help from another person toileting, which includes using toliet, bedpan, or urinal?: A Lot Help from another person bathing (including washing, rinsing, drying)?: A Lot Help from another person to put on and taking off regular upper body clothing?: A Little Help from another person to put on and taking off regular lower body clothing?: A Lot 6 Click Score: 16   End of Session Equipment Utilized During Treatment: Rolling walker (2 wheels);Gait belt Nurse Communication: Other (comment) (ok to have diet ginger ale)  Activity Tolerance: Patient tolerated treatment well Patient left: in chair;with call bell/phone within reach;with chair alarm set  OT Visit Diagnosis: Unsteadiness on feet (R26.81);Other abnormalities of gait and mobility (R26.89);Muscle weakness (generalized) (M62.81);Other symptoms and signs involving cognitive function                Time: 1200-1229 OT Time Calculation (min): 29 min Charges:  OT General Charges $OT Visit: 1 Visit OT Evaluation $OT Eval Moderate Complexity: 1 Mod  Mliss HERO, OTR/L Acute Rehabilitation Services Office: [phone]   Kennth Mliss Helling 12/26/2023, 12:44 PM

## 2023-12-26 NOTE — Progress Notes (Signed)
 Physical Therapy Treatment Patient Details Name: Paul Kent MRN: [id number] DOB: [birth year] Today's Date: 12/26/2023   History of Present Illness Patient is a 83 year old male who presented to the ED 7/5 with AMS. PMH: A-fib on Xarelto , chronic HFpEF, SSS s/p PPM, CKD stage IIIb, T2DM, HTN, HLD, hypothyroidism, macrocytic anemia, thrombocytopenia, BPH, chronic blepharospasm, essential tremor, and OSA on CPAP    PT Comments  Pt much improved cognitively and functionally compared to yesterday. Pt able to amb with minA and RW this date. Pt remains confused regarding situation but is oriented x3. Pt with improved ability to follow commands and demo'd improved safety awareness. Pt was indep with RW and driving PTA. Pt to continue to benefit from inpatient rehab program < 3 hrs a day to achieve safe mod I level of function and minimize fall risk for safe transition home with spouse. Acute PT to cont to follow.    If plan is discharge home, recommend the following: A lot of help with walking and/or transfers;A lot of help with bathing/dressing/bathroom;Supervision due to cognitive status   Can travel by private vehicle     No  Equipment Recommendations  None recommended by PT    Recommendations for Other Services       Precautions / Restrictions Precautions Precautions: Fall Restrictions Weight Bearing Restrictions Per Provider Order: No     Mobility  Bed Mobility Overal bed mobility: Needs Assistance Bed Mobility: Supine to Sit     Supine to sit: Mod assist     General bed mobility comments: modA for trunk elevation but able to bring LEs off EOB this date, HOB elevated    Transfers Overall transfer level: Needs assistance Equipment used: Rolling walker (2 wheels) Transfers: Sit to/from Stand, Bed to chair/wheelchair/BSC Sit to Stand: Mod assist   Step pivot transfers: Min assist       General transfer comment: modA to power up from EOB, pt with posterior bias  requiring modA to achieve midline. benefits from raised surface as pt 6'3. pt more aware of surroundings and safety this date asking PT Let me know when it's okay for me to sit in the chair. compared to yesterday pt impulsively sitting requiring maxA to prevent fall to floor    Ambulation/Gait Ambulation/Gait assistance: Min assist Gait Distance (Feet): 150 Feet Assistive device: Rolling walker (2 wheels) Gait Pattern/deviations: Step-through pattern, Decreased stride length, Trunk flexed Gait velocity: dec Gait velocity interpretation: <1.31 ft/sec, indicative of household ambulator   General Gait Details: pt with reciprocal gait pattern, pt with decreased step height on L LE due to previous injury. pt with good walker management, minA for stability initially progressing to contact guard   Stairs             Wheelchair Mobility     Tilt Bed    Modified Rankin (Stroke Patients Only)       Balance Overall balance assessment: Needs assistance Sitting-balance support: Feet supported, Bilateral upper extremity supported Sitting balance-Leahy Scale: Fair     Standing balance support: Bilateral upper extremity supported Standing balance-Leahy Scale: Fair Standing balance comment: reliant on external support for amb                            Communication Communication Communication: No apparent difficulties  Cognition Arousal: Alert Behavior During Therapy: WFL for tasks assessed/performed   PT - Cognitive impairments: Awareness, Memory, Orientation, Problem solving, Safety/Judgement  PT - Cognition Comments: pt A&Ox4 this date. Pt didn't recall working with PT yesterday. Pt able to follow commands. mild confusion regarding situation but states, I'm going to rehab Following commands: Impaired Following commands impaired: Follows one step commands with increased time    Cueing Cueing Techniques: Verbal cues, Tactile cues   Exercises      General Comments General comments (skin integrity, edema, etc.): VSS      Pertinent Vitals/Pain      Home Living                          Prior Function            PT Goals (current goals can now be found in the care plan section) Acute Rehab PT Goals Patient Stated Goal: home today PT Goal Formulation: With patient/family Time For Goal Achievement: 01/08/24 Potential to Achieve Goals: Good Progress towards PT goals: Progressing toward goals    Frequency    Min 3X/week      PT Plan      Co-evaluation              AM-PAC PT 6 Clicks Mobility   Outcome Measure  Help needed turning from your back to your side while in a flat bed without using bedrails?: A Lot Help needed moving from lying on your back to sitting on the side of a flat bed without using bedrails?: A Lot Help needed moving to and from a bed to a chair (including a wheelchair)?: A Lot Help needed standing up from a chair using your arms (e.g., wheelchair or bedside chair)?: A Lot Help needed to walk in hospital room?: Total Help needed climbing 3-5 steps with a railing? : Total 6 Click Score: 10    End of Session Equipment Utilized During Treatment: Gait belt Activity Tolerance: Patient tolerated treatment well Patient left: in chair;with call bell/phone within reach;with chair alarm set;with family/visitor present Nurse Communication: Mobility status PT Visit Diagnosis: Unsteadiness on feet (R26.81);Muscle weakness (generalized) (M62.81);Difficulty in walking, not elsewhere classified (R26.2)     Time: 8971-8948 PT Time Calculation (min) (ACUTE ONLY): 23 min  Charges:    $Gait Training: 8-22 mins $Therapeutic Activity: 8-22 mins PT General Charges $$ ACUTE PT VISIT: 1 Visit                     Paul Kent, PT, DPT Acute Rehabilitation Services Secure chat preferred Office #: [phone]    Paul Kent 12/26/2023, 11:41 AM

## 2023-12-26 NOTE — TOC Progression Note (Addendum)
 Transition of Care [facility]) - Progression Note    Patient Details  Name: Paul Kent MRN: [id number] Date of Birth: 1940-09-25  Transition [facility]) CM/SW Contact  Lauraine FORBES Saa, LCSW Phone Number: 12/26/2023, 9:07 AM  Clinical Narrative:     9:07 AM CSW informed patient's spouse, Paul Kent, of [facility] bed offer (patient is not fully oriented). Paul Kent accepted bed offer and expressed preference in private room for patient at SNF. CSW relayed information to SNF admissions.  2:16 PM [facility]  SNF admissions informed CSW that they would be able to admit patient tomorrow pending medical readiness. CSW relayed information to medical team and patient's spouse, Paul Kent.  Expected Discharge Plan: Skilled Nursing Facility Barriers to Discharge: Continued Medical Work up  Expected Discharge Plan and Services In-house Referral: Clinical Social Work   Post Acute Care Choice: Skilled Nursing Facility Living arrangements for the past 2 months: Single Family Home                                       Social Determinants of Health (SDOH) Interventions SDOH Screenings   Food Insecurity: No Food Insecurity (12/24/2023)  Housing: Unknown (12/24/2023)  Transportation Needs: No Transportation Needs (12/24/2023)  Utilities: Not At Risk (12/24/2023)  Alcohol Screen: Low Risk  (05/30/2023)  Depression (PHQ2-9): Low Risk  (06/07/2023)  Financial Resource Strain: Low Risk  (05/30/2023)  Physical Activity: Sufficiently Active (05/30/2023)  Social Connections: Moderately Isolated (12/24/2023)  Stress: No Stress Concern Present (05/30/2023)  Tobacco Use: Low Risk  (12/18/2023)  Health Literacy: Adequate Health Literacy (05/30/2023)    Readmission Risk Interventions     No data to display

## 2023-12-26 NOTE — Plan of Care (Signed)

## 2023-12-26 NOTE — Progress Notes (Signed)
 Pt was alert and oriented x4 during the night. Answered all orientations appropriately.  Delayed responses noted on assessment and occasional rambling. Cooperative during the night Pt did request melatonin to help him sleep as he does have difficulties at night and takes this at home per pt report.   Voiding+ Gas+   Per notes pt is expected to possible go to rehab with strong preference to [facility] or [facility], If unable to get placement at [facility] pt wife does have strong interest in home with [facility] services if possible.   ACHS blood glucose monitoring no supplemental insulin  coverage needed during the night.   Pt does not have no IV access at this time.

## 2023-12-26 NOTE — Progress Notes (Signed)
 PROGRESS NOTE    Paul Kent  FMW:[id number] DOB: 09/22/1940 DOA: 12/22/2023 PCP: Sherre Clapper, MD   Brief Narrative:  KENAN Kent is a 83 y.o. male with medical history significant for persistent A-fib on Xarelto , chronic HFpEF, SSS s/p PPM, CKD stage IIIb, T2DM, HTN, HLD, hypothyroidism, macrocytic anemia, thrombocytopenia, BPH, chronic blepharospasm, essential tremor, and OSA on CPAP who is admitted with acute encephalopathy.  Neurology is likely believed to be the initiation of baclofen  in the outpatient setting.  He is much more awake and alert and baclofen  has been discontinued as well as his other medications that could potentially cause altered mental status.  He is improved and PT OT recommending SNF and can be discharged to SNF in the a.m.  Assessment and Plan:  Acute Toxic Encephalopathy/fever: Improved significantly. Had New onset encephalopathy 2 days PTA, has been confused, somnolent, not following commands.  Recently started on baclofen  which may be primary etiology with decreased clearance in the setting of renal dysfunction.  ontinue to hold gabapentin , primidone .  Baclofen  discontinued. Infectious workup negative so far, flu, RSV, COVID-negative, UA negative, chest x-ray showed cardiomegaly with vascular congestion but no pneumonia. CT head showed no acute intracranial findings, atrophy and white matter microvascular disease. Ammonia level normal, ABG with no hypercarbia. Today A and O x3; Appearing close to his baseline, PT/OT recommending SNF.    Persistent Atrial Fibrillation SSS s/p PPM: Paced rhythm on admission. Continue amiodarone  and Xarelto .   Chronic HFpEF: Appears mildly hypovolemic on admission.  Holding Torsemide .  DC IV fluids, encourage p.o. diet. CTM for S/Sx of Volume Overload.   CKD stage IIIb: Renal function actually improved from baseline on admission. BUN/Cr Trend: Recent Labs  Lab 12/18/23 0935 12/22/23 1419 12/23/23 2040 12/24/23 0206  12/25/23 0945 12/26/23 0516  BUN 27 24* 18 15 19  24*  CREATININE 1.81* 1.43* 1.22 1.14 1.19 1.39*  -Avoid Nephrotoxic Medications, Contrast Dyes, Hypotension and Dehydration to Ensure Adequate Renal Perfusion and will need to Renally Adjust Meds -Continue to Monitor and Trend Renal Function carefully and repeat CMP in the AM    Type 2 diabetes with peripheral neuropathy, uncontrolled with hyperglycemia: Hemoglobin A1c 6.4 on 12/18/2023. Increased SSI to moderate, added Semglee  5 units daily. CTM Blood Sugars per Protocol. CBGs ranging from 145-167   Diabetic Neuropathy: Hold gabapentin    Chronic Macrocytic/Normocytic Anemia, B12 deficiency: H&H stable, at baseline 11-12. Hgb/Hct Trend:  Recent Labs  Lab 12/18/23 0935 12/22/23 1419 12/22/23 1419 12/23/23 2040 12/24/23 0206 12/25/23 0945 12/26/23 0516  HGB 11.3* 11.3*  --  12.7* 12.2* 11.6* 11.4*  HCT 35.3* 35.0*   < > 39.4 36.9* 33.9* 33.4*  MCV 101* 104.8*   < > 102.6* 100.0 98.3 98.8   < > = values in this interval not displayed.  -Continue B12 supplement 1000 mcg daily. CTM for S/Sx of Bleeding; No overt bleeding noted. Repeat CBC in the AM    Chronic, mild thrombocytopenia: Plt Count Trend:  Recent Labs  Lab 12/18/23 0935 12/22/23 1419 12/23/23 2040 12/24/23 0206 12/25/23 0945 12/26/23 0516  PLT 141* 126* 141* 139* 145* 141*  -CTM counts, no obvious bleeding   Hyperlipidemia: Continue Atorvastatin  10 mg po daily    Chronic blepharospasm: Baclofen  discontinued..  Recommend outpatient ambulatory referral to neurology.   Hypothyroidism: Continue Levothyroxine  137 mcg po Daily. TSH 2.309, free T4 was 1.29   Essential tremor: Continue holding Primidone .   BPH: C/w Tamsulosin  0.4 mg po qHS   OSA: Can resume CPAP  now that mentation has improved.  Hypoalbuminemia: Albumin Lvl Trending down and went from 4.1 -> 3.4 -> 3.0 -> 2.8. CTM and Trend and repeat CMP in the AM   Class I Obesity: Complicates overall prognosis and  care. Estimated body mass index is 31.17 kg/m as calculated from the following:   Height as of 12/18/23: 6' 3 (1.905 m).   Weight as of this encounter: 113.1 kg. Weight Loss and Dietary Counseling given   DVT prophylaxis:  Rivaroxaban  (XARELTO ) tablet 15 mg    Code Status: Do not attempt resuscitation (DNR) PRE-ARREST INTERVENTIONS DESIRED Family Communication: No family present @ bedside  Disposition Plan:  Level of care: Med-Surg Status is: Inpatient Remains inpatient appropriate because: Needs SNF and Bed available 12/27/23   Consultants:  None  Procedures:  As delineated as above  Antimicrobials:  Anti-infectives (From admission, onward)    None       Subjective: Seen and examined at bedside and was doing much better today and was awake and alert and oriented.  Denied any complaints.  Thinks she is getting stronger and states that he worked well with therapy.  No other concerns or complaints at this time.  Objective: Vitals:   12/25/23 1935 12/26/23 0339 12/26/23 0727 12/26/23 1643  BP: (!) 146/98 127/63 (!) 127/58 123/80  Pulse: 98 61 (!) 59 (!) 59  Resp: 16 16    Temp: 98.5 F (36.9 C) 98.2 F (36.8 C) 98 F (36.7 C) 98 F (36.7 C)  TempSrc: Oral Oral Oral   SpO2: 98% 100% 96% 98%  Weight:      Height:   6' 3 (1.905 m)     Intake/Output Summary (Last 24 hours) at 12/26/2023 1742 Last data filed at 12/25/2023 2113 Gross per 24 hour  Intake --  Output 1000 ml  Net -1000 ml   Filed Weights   12/24/23 0500 12/25/23 0225  Weight: 112.8 kg 113.1 kg   Examination: Physical Exam:  Constitutional: WN/WD obese elderly Caucasian male in no acute distress Respiratory: Diminished to auscultation bilaterally, no wheezing, rales, rhonchi or crackles. Normal respiratory effort and patient is not tachypenic. No accessory muscle use.  Unlabored breathing Cardiovascular: RRR, no murmurs / rubs / gallops. S1 and S2 auscultated.  Minimal extremity edema.  Abdomen: Soft,  non-tender, distended secondary to body habitus bowel sounds positive.  GU: Deferred. Musculoskeletal: No clubbing / cyanosis of digits/nails. No joint deformity upper and lower extremities. Good ROM, no contractures. Normal strength and muscle tone.  Skin: No rashes, lesions, ulcers. No induration; Warm and dry.  Neurologic: CN 2-12 grossly intact with no focal deficits. Sensation intact in all 4 Extremities, DTR normal. Strength 5/5 in all 4. Romberg sign cerebellar reflexes not assessed.  Psychiatric: Normal judgment and insight. Alert and oriented x 3.  Data Reviewed: I have personally reviewed following labs and imaging studies  CBC: Recent Labs  Lab 12/22/23 1419 12/23/23 2040 12/24/23 0206 12/25/23 0945 12/26/23 0516  WBC 5.7 5.5 6.1 5.2 5.1  NEUTROABS 3.9  --   --   --   --   HGB 11.3* 12.7* 12.2* 11.6* 11.4*  HCT 35.0* 39.4 36.9* 33.9* 33.4*  MCV 104.8* 102.6* 100.0 98.3 98.8  PLT 126* 141* 139* 145* 141*   Basic Metabolic Panel: Recent Labs  Lab 12/22/23 1419 12/23/23 2040 12/24/23 0206 12/25/23 0945 12/26/23 0516  NA 141 140 139 136 134*  K 4.2 4.5 4.2 4.1 3.9  CL 109 107 109 107 106  CO2  22 22 21* 19* 23  GLUCOSE 166* 157* 161* 215* 172*  BUN 24* 18 15 19  24*  CREATININE 1.43* 1.22 1.14 1.19 1.39*  CALCIUM  8.5* 9.1 8.6* 8.5* 8.1*  PHOS  --   --   --  2.7 4.1   GFR: Estimated Creatinine Clearance: 52.7 mL/min (A) (by C-G formula based on SCr of 1.39 mg/dL (H)). Liver Function Tests: Recent Labs  Lab 12/22/23 1419 12/25/23 0945 12/26/23 0516  AST 18  --   --   ALT 15  --   --   ALKPHOS 89  --   --   BILITOT 0.7  --   --   PROT 6.5  --   --   ALBUMIN 3.4* 3.0* 2.8*   No results for input(s): LIPASE, AMYLASE in the last 168 hours. Recent Labs  Lab 12/22/23 1419  AMMONIA 21   Coagulation Profile: No results for input(s): INR, PROTIME in the last 168 hours. Cardiac Enzymes: No results for input(s): CKTOTAL, CKMB, CKMBINDEX,  TROPONINI in the last 168 hours. BNP (last 3 results) Recent Labs    01/11/23 1134 01/23/23 1603  PROBNP 6,026* 3,185*   HbA1C: No results for input(s): HGBA1C in the last 72 hours. CBG: Recent Labs  Lab 12/25/23 1644 12/25/23 2109 12/26/23 0728 12/26/23 1207 12/26/23 1644  GLUCAP 169* 167* 159* 167* 145*   Lipid Profile: No results for input(s): CHOL, HDL, LDLCALC, TRIG, CHOLHDL, LDLDIRECT in the last 72 hours. Thyroid  Function Tests: No results for input(s): TSH, T4TOTAL, FREET4, T3FREE, THYROIDAB in the last 72 hours. Anemia Panel: No results for input(s): VITAMINB12, FOLATE, FERRITIN, TIBC, IRON, RETICCTPCT in the last 72 hours. Sepsis Labs: No results for input(s): PROCALCITON, LATICACIDVEN in the last 168 hours.  Recent Results (from the past 240 hours)  Resp panel by RT-PCR (RSV, Flu A&B, Covid) Anterior Nasal Swab     Status: None   Collection Time: 12/23/23  7:25 AM   Specimen: Anterior Nasal Swab  Result Value Ref Range Status   SARS Coronavirus 2 by RT PCR NEGATIVE NEGATIVE Final   Influenza A by PCR NEGATIVE NEGATIVE Final   Influenza B by PCR NEGATIVE NEGATIVE Final    Comment: (NOTE) The Xpert Xpress SARS-CoV-2/FLU/RSV plus assay is intended as an aid in the diagnosis of influenza from Nasopharyngeal swab specimens and should not be used as a sole basis for treatment. Nasal washings and aspirates are unacceptable for Xpert Xpress SARS-CoV-2/FLU/RSV testing.  Fact Sheet for Patients: BloggerCourse.com  Fact Sheet for Healthcare Providers: SeriousBroker.it  This test is not yet approved or cleared by the [place]  FDA and has been authorized for detection and/or diagnosis of SARS-CoV-2 by FDA under an Emergency Use Authorization (EUA). This EUA will remain in effect (meaning this test can be used) for the duration of the COVID-19 declaration under Section  564(b)(1) of the Act, 21 U.S.C. section 360bbb-3(b)(1), unless the authorization is terminated or revoked.     Resp Syncytial Virus by PCR NEGATIVE NEGATIVE Final    Comment: (NOTE) Fact Sheet for Patients: BloggerCourse.com  Fact Sheet for Healthcare Providers: SeriousBroker.it  This test is not yet approved or cleared by the [place]  FDA and has been authorized for detection and/or diagnosis of SARS-CoV-2 by FDA under an Emergency Use Authorization (EUA). This EUA will remain in effect (meaning this test can be used) for the duration of the COVID-19 declaration under Section 564(b)(1) of the Act, 21 U.S.C. section 360bbb-3(b)(1), unless the authorization is terminated or revoked.  Performed at [facility], [address] [place], [place] [address]   Culture, blood (Routine X 2) w Reflex to ID Panel     Status: None (Preliminary result)   Collection Time: 12/23/23  8:40 PM   Specimen: BLOOD  Result Value Ref Range Status   Specimen Description BLOOD RIGHT ANTECUBITAL  Final   Special Requests   Final    BOTTLES DRAWN AEROBIC AND ANAEROBIC Blood Culture adequate volume   Culture   Final    NO GROWTH 3 DAYS Performed at [facility], [address], [place], [place] [address]    Report Status PENDING  Incomplete  Culture, blood (Routine X 2) w Reflex to ID Panel     Status: None (Preliminary result)   Collection Time: 12/23/23  9:00 PM   Specimen: BLOOD RIGHT HAND  Result Value Ref Range Status   Specimen Description BLOOD RIGHT HAND  Final   Special Requests   Final    BOTTLES DRAWN AEROBIC AND ANAEROBIC Blood Culture adequate volume   Culture   Final    NO GROWTH 3 DAYS Performed at [facility], [address] [place], [place] [address]    Report Status PENDING  Incomplete    Radiology Studies: No results found.  Scheduled Meds:  amiodarone   200 mg Oral Daily   atorvastatin   10 mg Oral  QPM   vitamin B-12  1,000 mcg Oral Daily   insulin  aspart  0-15 Units Subcutaneous TID WC   insulin  aspart  0-5 Units Subcutaneous QHS   insulin  glargine-yfgn  5 Units Subcutaneous Daily   levothyroxine   137 mcg Oral q morning   Rivaroxaban   15 mg Oral QPM   tamsulosin   0.4 mg Oral QPM   Continuous Infusions:   LOS: 2 days   Alejandro Marker, DO [facility] Available via Epic secure chat 7am-7pm After these hours, please refer to coverage provider listed on [email or web address] 12/26/2023, 5:42 PM

## 2023-12-27 ENCOUNTER — Other Ambulatory Visit: Payer: Self-pay | Admitting: Family Medicine

## 2023-12-27 DIAGNOSIS — I509 Heart failure, unspecified: Secondary | ICD-10-CM | POA: Diagnosis not present

## 2023-12-27 DIAGNOSIS — G4733 Obstructive sleep apnea (adult) (pediatric): Secondary | ICD-10-CM | POA: Diagnosis not present

## 2023-12-27 DIAGNOSIS — E1169 Type 2 diabetes mellitus with other specified complication: Secondary | ICD-10-CM | POA: Diagnosis not present

## 2023-12-27 DIAGNOSIS — D539 Nutritional anemia, unspecified: Secondary | ICD-10-CM | POA: Diagnosis not present

## 2023-12-27 DIAGNOSIS — E1142 Type 2 diabetes mellitus with diabetic polyneuropathy: Secondary | ICD-10-CM | POA: Diagnosis not present

## 2023-12-27 DIAGNOSIS — I5032 Chronic diastolic (congestive) heart failure: Secondary | ICD-10-CM | POA: Diagnosis not present

## 2023-12-27 DIAGNOSIS — K219 Gastro-esophageal reflux disease without esophagitis: Secondary | ICD-10-CM | POA: Diagnosis not present

## 2023-12-27 DIAGNOSIS — I4819 Other persistent atrial fibrillation: Secondary | ICD-10-CM | POA: Diagnosis not present

## 2023-12-27 DIAGNOSIS — N401 Enlarged prostate with lower urinary tract symptoms: Secondary | ICD-10-CM | POA: Diagnosis not present

## 2023-12-27 DIAGNOSIS — E785 Hyperlipidemia, unspecified: Secondary | ICD-10-CM

## 2023-12-27 DIAGNOSIS — I5033 Acute on chronic diastolic (congestive) heart failure: Secondary | ICD-10-CM | POA: Diagnosis not present

## 2023-12-27 DIAGNOSIS — R531 Weakness: Secondary | ICD-10-CM | POA: Diagnosis not present

## 2023-12-27 DIAGNOSIS — E66811 Obesity, class 1: Secondary | ICD-10-CM | POA: Diagnosis not present

## 2023-12-27 DIAGNOSIS — G250 Essential tremor: Secondary | ICD-10-CM | POA: Diagnosis not present

## 2023-12-27 DIAGNOSIS — Z7401 Bed confinement status: Secondary | ICD-10-CM | POA: Diagnosis not present

## 2023-12-27 DIAGNOSIS — D649 Anemia, unspecified: Secondary | ICD-10-CM | POA: Diagnosis not present

## 2023-12-27 DIAGNOSIS — R4182 Altered mental status, unspecified: Secondary | ICD-10-CM | POA: Diagnosis not present

## 2023-12-27 DIAGNOSIS — D6489 Other specified anemias: Secondary | ICD-10-CM | POA: Diagnosis not present

## 2023-12-27 DIAGNOSIS — I130 Hypertensive heart and chronic kidney disease with heart failure and stage 1 through stage 4 chronic kidney disease, or unspecified chronic kidney disease: Secondary | ICD-10-CM | POA: Diagnosis not present

## 2023-12-27 DIAGNOSIS — H524 Presbyopia: Secondary | ICD-10-CM | POA: Diagnosis not present

## 2023-12-27 DIAGNOSIS — Z950 Presence of cardiac pacemaker: Secondary | ICD-10-CM

## 2023-12-27 DIAGNOSIS — D519 Vitamin B12 deficiency anemia, unspecified: Secondary | ICD-10-CM | POA: Diagnosis not present

## 2023-12-27 DIAGNOSIS — H5989 Other postprocedural complications and disorders of eye and adnexa, not elsewhere classified: Secondary | ICD-10-CM | POA: Insufficient documentation

## 2023-12-27 DIAGNOSIS — G245 Blepharospasm: Secondary | ICD-10-CM | POA: Diagnosis not present

## 2023-12-27 DIAGNOSIS — G9340 Encephalopathy, unspecified: Secondary | ICD-10-CM | POA: Diagnosis not present

## 2023-12-27 DIAGNOSIS — N1832 Chronic kidney disease, stage 3b: Secondary | ICD-10-CM | POA: Diagnosis not present

## 2023-12-27 DIAGNOSIS — R262 Difficulty in walking, not elsewhere classified: Secondary | ICD-10-CM | POA: Diagnosis not present

## 2023-12-27 DIAGNOSIS — E039 Hypothyroidism, unspecified: Secondary | ICD-10-CM | POA: Diagnosis not present

## 2023-12-27 DIAGNOSIS — D696 Thrombocytopenia, unspecified: Secondary | ICD-10-CM | POA: Diagnosis not present

## 2023-12-27 DIAGNOSIS — H02403 Unspecified ptosis of bilateral eyelids: Secondary | ICD-10-CM | POA: Diagnosis not present

## 2023-12-27 DIAGNOSIS — I4891 Unspecified atrial fibrillation: Secondary | ICD-10-CM | POA: Diagnosis not present

## 2023-12-27 DIAGNOSIS — E1140 Type 2 diabetes mellitus with diabetic neuropathy, unspecified: Secondary | ICD-10-CM | POA: Diagnosis not present

## 2023-12-27 LAB — CBC WITH DIFFERENTIAL/PLATELET
Abs Immature Granulocytes: 0.01 K/uL (ref 0.00–0.07)
Basophils Absolute: 0.1 K/uL (ref 0.0–0.1)
Basophils Relative: 2 %
Eosinophils Absolute: 0.2 K/uL (ref 0.0–0.5)
Eosinophils Relative: 6 %
HCT: 32.3 % — ABNORMAL LOW (ref 39.0–52.0)
Hemoglobin: 10.9 g/dL — ABNORMAL LOW (ref 13.0–17.0)
Immature Granulocytes: 0 %
Lymphocytes Relative: 22 %
Lymphs Abs: 0.9 K/uL (ref 0.7–4.0)
MCH: 33 pg (ref 26.0–34.0)
MCHC: 33.7 g/dL (ref 30.0–36.0)
MCV: 97.9 fL (ref 80.0–100.0)
Monocytes Absolute: 0.7 K/uL (ref 0.1–1.0)
Monocytes Relative: 17 %
Neutro Abs: 2.2 K/uL (ref 1.7–7.7)
Neutrophils Relative %: 53 %
Platelets: 149 K/uL — ABNORMAL LOW (ref 150–400)
RBC: 3.3 MIL/uL — ABNORMAL LOW (ref 4.22–5.81)
RDW: 13.7 % (ref 11.5–15.5)
WBC: 4.2 K/uL (ref 4.0–10.5)
nRBC: 0 % (ref 0.0–0.2)

## 2023-12-27 LAB — FERRITIN: Ferritin: 192 ng/mL (ref 24–336)

## 2023-12-27 LAB — PHOSPHORUS: Phosphorus: 4.3 mg/dL (ref 2.5–4.6)

## 2023-12-27 LAB — COMPREHENSIVE METABOLIC PANEL WITH GFR
ALT: 11 U/L (ref 0–44)
AST: 19 U/L (ref 15–41)
Albumin: 3 g/dL — ABNORMAL LOW (ref 3.5–5.0)
Alkaline Phosphatase: 67 U/L (ref 38–126)
Anion gap: 9 (ref 5–15)
BUN: 26 mg/dL — ABNORMAL HIGH (ref 8–23)
CO2: 22 mmol/L (ref 22–32)
Calcium: 8.8 mg/dL — ABNORMAL LOW (ref 8.9–10.3)
Chloride: 108 mmol/L (ref 98–111)
Creatinine, Ser: 1.62 mg/dL — ABNORMAL HIGH (ref 0.61–1.24)
GFR, Estimated: 41 mL/min — ABNORMAL LOW (ref >60)
Glucose, Bld: 127 mg/dL — ABNORMAL HIGH (ref 70–99)
Potassium: 3.9 mmol/L (ref 3.5–5.1)
Sodium: 139 mmol/L (ref 135–145)
Total Bilirubin: 0.9 mg/dL (ref 0.0–1.2)
Total Protein: 6 g/dL — ABNORMAL LOW (ref 6.5–8.1)

## 2023-12-27 LAB — RETICULOCYTES
Immature Retic Fract: 20.7 % — ABNORMAL HIGH (ref 2.3–15.9)
RBC.: 3.34 MIL/uL — ABNORMAL LOW (ref 4.22–5.81)
Retic Count, Absolute: 83.8 K/uL (ref 19.0–186.0)
Retic Ct Pct: 2.5 % (ref 0.4–3.1)

## 2023-12-27 LAB — MAGNESIUM: Magnesium: 2 mg/dL (ref 1.7–2.4)

## 2023-12-27 LAB — IRON AND TIBC
Iron: 58 ug/dL (ref 45–182)
Saturation Ratios: 26 % (ref 17.9–39.5)
TIBC: 224 ug/dL — ABNORMAL LOW (ref 250–450)
UIBC: 166 ug/dL

## 2023-12-27 LAB — GLUCOSE, CAPILLARY
Glucose-Capillary: 190 mg/dL — ABNORMAL HIGH (ref 70–99)
Glucose-Capillary: 124 mg/dL — ABNORMAL HIGH (ref 70–99)

## 2023-12-27 LAB — VITAMIN B12: Vitamin B-12: 251 pg/mL (ref 180–914)

## 2023-12-27 LAB — FOLATE: Folate: 11.6 ng/mL (ref >5.9)

## 2023-12-27 MED ORDER — BUTALBITAL-APAP-CAFFEINE 50-325-40 MG PO TABS
1.0000 | ORAL_TABLET | Freq: Four times a day (QID) | ORAL | Status: DC | PRN
  Administered 2023-12-27: 1 via ORAL
  Filled 2023-12-27: qty 1

## 2023-12-27 MED ORDER — SODIUM CHLORIDE 0.9 % IV SOLN: INTRAVENOUS | Status: DC

## 2023-12-27 MED ORDER — BUTALBITAL-APAP-CAFFEINE 50-325-40 MG PO TABS
1.0000 | ORAL_TABLET | Freq: Four times a day (QID) | ORAL | 0 refills | Status: AC | PRN
Start: 2023-12-27 — End: ?

## 2023-12-27 MED ORDER — ONDANSETRON HCL 4 MG PO TABS: 4.0000 mg | ORAL_TABLET | Freq: Four times a day (QID) | ORAL | Status: AC | PRN

## 2023-12-27 MED ORDER — CYANOCOBALAMIN 1000 MCG PO TABS: 1000.0000 ug | ORAL_TABLET | Freq: Every day | ORAL | Status: AC

## 2023-12-27 MED ORDER — SENNOSIDES-DOCUSATE SODIUM 8.6-50 MG PO TABS: 1.0000 | ORAL_TABLET | Freq: Every evening | ORAL | Status: AC | PRN

## 2023-12-27 MED ORDER — ACETAMINOPHEN 325 MG PO TABS: 650.0000 mg | ORAL_TABLET | Freq: Four times a day (QID) | ORAL | 0 refills | Status: AC | PRN

## 2023-12-27 NOTE — Discharge Summary (Signed)
 Physician Discharge Summary   Patient: Paul Kent MRN: [id number] DOB: 1941-05-05  Admit date:     12/22/2023  Discharge date: 12/27/23  Discharge Physician: Alejandro Marker, DO   PCP: Sherre Clapper, MD   Recommendations at discharge:   Follow up with PCP within 1 to 2 weeks and repeat CBC, CMP, mag, Phos within 1 week Follow-up with Neurology Dr. Margaret within 1 to 2 weeks and follow-up for migraine headaches and the chronic blepharospasm.  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 *  Hospital Course: Paul Kent is a 83 y.o. male with medical history significant for persistent A-fib on Xarelto , chronic HFpEF, SSS s/p PPM, CKD stage IIIb, T2DM, HTN, HLD, hypothyroidism, macrocytic anemia, thrombocytopenia, BPH, chronic blepharospasm, essential tremor, and OSA on CPAP who is admitted with acute encephalopathy.  Neurology is likely believed to be the initiation of baclofen  in the outpatient setting.  He is much more awake and alert and baclofen  has been discontinued as well as his other medications that could potentially cause altered mental status.  He is much improved and medically stable for discharge.  He did have a migraine headache prior to discharge so we will give him some Fioricet .  He was also complaining about his chronic muscle spasm and his right eye.  He states that he will see neurology in outpatient  setting.  Assessment and Plan:  Acute Toxic Encephalopathy/fever: Improved significantly. Had New onset encephalopathy 2 days PTA, has been confused, somnolent, not following commands.  Recently started on baclofen  which may be primary etiology with decreased clearance in the setting of renal dysfunction.  ontinue to hold gabapentin , primidone .  Baclofen  discontinued. Infectious workup negative so far, flu, RSV, COVID-negative, UA negative, chest x-ray showed cardiomegaly with vascular congestion but no pneumonia. CT head showed no acute intracranial findings, atrophy and white matter microvascular disease. Ammonia level normal, ABG with no hypercarbia. Today A and O x3; Appearing close to his baseline and is much improved, PT/OT recommending SNF.     Persistent Atrial Fibrillation SSS s/p PPM: Paced rhythm on admission. Continue Amiodarone  and Xarelto .   Chronic HFpEF: Appears mildly hypovolemic on admission.  Holding Torsemide .  Resumed IV fluids as below temporarily, encourage p.o. diet. CTM for S/Sx of Volume Overload.   CKD stage IIIb: Renal function actually improved from baseline on admission. BUN/Cr Trend: Recent Labs  Lab 12/18/23 0935 12/22/23 1419 12/23/23 2040 12/24/23 0206 12/25/23 0945 12/26/23 0516 12/27/23 0513  BUN 27 24* 18 15 19  24* 26*  CREATININE 1.81* 1.43* 1.22 1.14 1.19 1.39* 1.62*  -Will give gentle IVF Hydration with NS @ 75 mL/hr until he is discharged and will have repeat CMP within 1 week -Avoid Nephrotoxic Medications, Contrast Dyes, Hypotension and Dehydration to Ensure Adequate Renal Perfusion and will need to Renally Adjust Meds -Continue to Monitor and Trend Renal Function carefully and repeat CMP in the AM    Type  2 diabetes with peripheral neuropathy, uncontrolled with hyperglycemia: Hemoglobin A1c 6.4 on 12/18/2023. Increased SSI to moderate, added Semglee  5 units daily. CTM Blood Sugars per Protocol. CBGs ranging from 124-190   Diabetic Neuropathy:  Held Gabapentin  for now   Chronic Macrocytic/Normocytic Anemia, B12 deficiency: H&H stable, at baseline 11-12. Hgb/Hct Trend:  Recent Labs  Lab 12/18/23 0935 12/22/23 1419 12/22/23 1419 12/23/23 2040 12/24/23 0206 12/25/23 0945 12/26/23 0516 12/27/23 0513  HGB 11.3* 11.3*  --  12.7* 12.2* 11.6* 11.4* 10.9*  HCT 35.3* 35.0*   < > 39.4 36.9* 33.9* 33.4* 32.3*  MCV 101* 104.8*   < > 102.6* 100.0 98.3 98.8 97.9   < > = values in this interval not displayed.  -Continue B12 supplement 1000 mcg daily. CTM for S/Sx of Bleeding; No overt bleeding noted. Repeat CBC within 1 week   Chronic, mild thrombocytopenia: Plt Count Trend:  Recent Labs  Lab 12/18/23 0935 12/22/23 1419 12/23/23 2040 12/24/23 0206 12/25/23 0945 12/26/23 0516 12/27/23 0513  PLT 141* 126* 141* 139* 145* 141* 149*  -CTM counts, no obvious bleeding.  Repeat CBC within 1 week  Migraine and Light Sensitivity: Will give him some Fioricet  but if necessary will give him a triptan.  Will need outpatient follow-up and evaluation with neurology as likely this is related to his chronic blepharospasm as well   Hyperlipidemia: Continue Atorvastatin  10 mg po daily    Chronic blepharospasm: Baclofen  discontinued..  Recommend outpatient ambulatory referral to Neurology.   Hypothyroidism: Continue Levothyroxine  137 mcg po Daily. TSH 2.309, free T4 was 1.29   Essential tremor: Continue holding Primidone  for now and can resume in the outpatient setting.   BPH: C/w Tamsulosin  0.4 mg po qHS   OSA: Can resume CPAP now that mentation has improved.  Hypoalbuminemia: Albumin Lvl Trending down and went from 4.1 -> 3.4 -> 3.0 -> 2.8 at the time of discharge 3.0. CTM and Trend and repeat CMP in the AM   Class I Obesity: Complicates overall prognosis and care. Estimated body mass index is 31.44 kg/m as calculated from the following:   Height as of this encounter: 6' 3 (1.905 m).   Weight as of this encounter: 114.1 kg. Weight Loss and  Dietary Counseling given  Consultants: None Procedures performed: As delineated as above Disposition: Nursing home Diet recommendation:  Cardiac and Carb modified diet DISCHARGE MEDICATION: Allergies as of 12/27/2023       Reactions   Propranolol    Drops HR too low Other reaction(s): Other (See Comments) Drops HR too low Drops HR too low Drops HR too low   Canagliflozin    Patient had an adverse reaction Other reaction(s): Other (See Comments) Patient had an adverse reaction Patient had an adverse reaction   Clonidine Derivatives Nausea And Vomiting   Hydralazine  Hcl    Patient had an adverse reaction   Metformin And Related Diarrhea   Topiramate    Patient had an adverse reaction Other reaction(s): Other (See Comments) Patient had an adverse reaction Patient had an adverse reaction   Ambien  [zolpidem ] Other (See Comments)   Amnesia   Farxiga  [dapagliflozin ]    Recurrent UTIs.    Ozempic  (0.25 Or 0.5 Mg-dose) [semaglutide (0.25 Or 0.5mg -dos)] Other (See Comments)   Nausea, abdominal pain         Medication List     PAUSE taking these medications    gabapentin  300 MG capsule Wait to take this until your doctor or other care provider tells you to start  again. Commonly known as: NEURONTIN  TAKE ONE CAPSULE BY MOUTH AT BEDTIME   primidone  50 MG tablet Wait to take this until your doctor or other care provider tells you to start again. Commonly known as: MYSOLINE  Take 1 tablet (50 mg total) by mouth 2 (two) times daily.   torsemide  20 MG tablet Wait to take this until your doctor or other care provider tells you to start again. Commonly known as: DEMADEX  Take 20 mg by mouth daily as needed.       STOP taking these medications    baclofen  10 MG tablet Commonly known as: LIORESAL        TAKE these medications    acetaminophen  325 MG tablet Commonly known as: TYLENOL  Take 2 tablets (650 mg total) by mouth every 6 (six) hours as needed for mild pain  (pain score 1-3) or fever (or Fever >/= 101).   amiodarone  200 MG tablet Commonly known as: PACERONE  TAKE ONE TABLET BY MOUTH EVERY DAY What changed: additional instructions   atorvastatin  10 MG tablet Commonly known as: LIPITOR TAKE ONE TABLET BY MOUTH EVERY EVENING   cyanocobalamin  1000 MCG tablet Take 1 tablet (1,000 mcg total) by mouth daily. Start taking on: December 28, 2023   famotidine  40 MG tablet Commonly known as: PEPCID  Take 40 mg by mouth daily.   glipiZIDE  10 MG 24 hr tablet Commonly known as: GLUCOTROL  XL Take 10 mg by mouth 2 (two) times daily.   levothyroxine  137 MCG tablet Commonly known as: SYNTHROID  TAKE ONE TABLET BY MOUTH EVERY MORNING   ondansetron  4 MG tablet Commonly known as: ZOFRAN  Take 1 tablet (4 mg total) by mouth every 6 (six) hours as needed for nausea.   senna-docusate 8.6-50 MG tablet Commonly known as: Senokot-S Take 1 tablet by mouth at bedtime as needed for mild constipation.   tamsulosin  0.4 MG Caps capsule Commonly known as: FLOMAX  TAKE ONE CAPSULE BY MOUTH EVERY EVENING   tirzepatide  5 MG/0.5ML Pen Commonly known as: MOUNJARO  Inject 5 mg into the skin once a week.   Xarelto  15 MG Tabs tablet Generic drug: Rivaroxaban  TAKE ONE TABLET BY MOUTH EVERY EVENING        Follow-up Information     [facility] Follow up.   Specialty: Home Health Services Why: They will call you to set up services, referral sent via fax Contact information: [address] [place] [address] [phone]         Sherre Clapper, MD Follow up.   Specialty: Family Medicine Why: Please schedule a follow up appointment Contact information: [address] [place] [address] [phone]                Discharge Exam: Paul Kent   12/24/23 0500 12/25/23 0225 12/27/23 0359  Weight: 112.8 kg 113.1 kg 114.1 kg   Vitals:   12/27/23 0359 12/27/23 0756  BP: (!) 140/64 (!) 130/52  Pulse: 68 61  Resp: 16 18   Temp: 98.3 F (36.8 C) 97.8 F (36.6 C)  SpO2: 95% 95%   Examination: Physical Exam:  Constitutional: WN/WD obese elderly Caucasian male in NAD Respiratory: Diminished to auscultation bilaterally, no wheezing, rales, rhonchi or crackles. Normal respiratory effort and patient is not tachypenic. No accessory muscle use. Unlabored breathing  Cardiovascular: RRR, no murmurs / rubs / gallops. S1 and S2 auscultated. Mild LE edema  Abdomen: Soft, non-tender, distended 2/2 body habitus. Bowel sounds positive.  GU: Deferred. Musculoskeletal: No clubbing / cyanosis of  digits/nails. No joint deformity upper and lower extremities.  Skin: No rashes, lesions, ulcers on a limited skin evaluation. No induration; Warm and dry.  Neurologic: CN 2-12 grossly intact with no focal deficits but did have a little blepharospasm.  Romberg sign cerebellar reflexes not assessed.  Psychiatric: Normal judgment and insight. Alert and oriented x 3. Normal mood and appropriate affect.   Condition at discharge: stable  The results of significant diagnostics from this hospitalization (including imaging, microbiology, ancillary and laboratory) are listed below for reference.   Imaging Studies: DG Chest Port 1 View Result Date: 12/22/2023 CLINICAL DATA:  Confusion EXAM: PORTABLE CHEST 1 VIEW COMPARISON:  03/15/2023 FINDINGS: Left pacer remains in place, unchanged. Mild cardiomegaly, vascular congestion. No confluent opacities or overt edema. No effusions. No acute bony abnormality. IMPRESSION: Cardiomegaly, vascular congestion. Electronically Signed   By: Franky Crease M.D.   On: 12/22/2023 18:21   CT Head Wo Contrast Result Date: 12/22/2023 CLINICAL DATA:  Mental status change. EXAM: CT HEAD WITHOUT CONTRAST TECHNIQUE: Contiguous axial images were obtained from the base of the skull through the vertex without intravenous contrast. RADIATION DOSE REDUCTION: This exam was performed according to the departmental  dose-optimization program which includes automated exposure control, adjustment of the mA and/or kV according to patient size and/or use of iterative reconstruction technique. COMPARISON:  None Available. FINDINGS: Brain: No acute intracranial hemorrhage. No focal mass lesion. No CT evidence of acute infarction. No midline shift or mass effect. No hydrocephalus. Basilar cisterns are patent. There are periventricular and subcortical white matter hypodensities. Generalized cortical atrophy. Vascular: No hyperdense vessel or unexpected calcification. Skull: Normal. Negative for fracture or focal lesion. Sinuses/Orbits: Paranasal sinuses and mastoid air cells are clear. Orbits are clear. Other: None. IMPRESSION: 1. No acute intracranial findings. 2. Atrophy and white matter microvascular disease. Electronically Signed   By: Jackquline Boxer M.D.   On: 12/22/2023 16:03   CUP PACEART REMOTE DEVICE CHECK Result Date: 12/14/2023 PPM Scheduled remote reviewed. Normal device function.  Presenting rhythm:  AS/VP Next remote 91 days. LA, CVRS   Microbiology: Results for orders placed or performed during the hospital encounter of 12/22/23  Resp panel by RT-PCR (RSV, Flu A&B, Covid) Anterior Nasal Swab     Status: None   Collection Time: 12/23/23  7:25 AM   Specimen: Anterior Nasal Swab  Result Value Ref Range Status   SARS Coronavirus 2 by RT PCR NEGATIVE NEGATIVE Final   Influenza A by PCR NEGATIVE NEGATIVE Final   Influenza B by PCR NEGATIVE NEGATIVE Final    Comment: (NOTE) The Xpert Xpress SARS-CoV-2/FLU/RSV plus assay is intended as an aid in the diagnosis of influenza from Nasopharyngeal swab specimens and should not be used as a sole basis for treatment. Nasal washings and aspirates are unacceptable for Xpert Xpress SARS-CoV-2/FLU/RSV testing.  Fact Sheet for Patients: BloggerCourse.com  Fact Sheet for Healthcare Providers: SeriousBroker.it  This  test is not yet approved or cleared by the [place]  FDA and has been authorized for detection and/or diagnosis of SARS-CoV-2 by FDA under an Emergency Use Authorization (EUA). This EUA will remain in effect (meaning this test can be used) for the duration of the COVID-19 declaration under Section 564(b)(1) of the Act, 21 U.S.C. section 360bbb-3(b)(1), unless the authorization is terminated or revoked.     Resp Syncytial Virus by PCR NEGATIVE NEGATIVE Final    Comment: (NOTE) Fact Sheet for Patients: BloggerCourse.com  Fact Sheet for Healthcare Providers: SeriousBroker.it  This test is not yet  approved or cleared by the [place]  FDA and has been authorized for detection and/or diagnosis of SARS-CoV-2 by FDA under an Emergency Use Authorization (EUA). This EUA will remain in effect (meaning this test can be used) for the duration of the COVID-19 declaration under Section 564(b)(1) of the Act, 21 U.S.C. section 360bbb-3(b)(1), unless the authorization is terminated or revoked.  Performed at [facility], [address], [place], [place] [address]   Culture, blood (Routine X 2) w Reflex to ID Panel     Status: None (Preliminary result)   Collection Time: 12/23/23  8:40 PM   Specimen: BLOOD  Result Value Ref Range Status   Specimen Description BLOOD RIGHT ANTECUBITAL  Final   Special Requests   Final    BOTTLES DRAWN AEROBIC AND ANAEROBIC Blood Culture adequate volume   Culture   Final    NO GROWTH 4 DAYS Performed at [facility], [address], [place], [place] [address]    Report Status PENDING  Incomplete  Culture, blood (Routine X 2) w Reflex to ID Panel     Status: None (Preliminary result)   Collection Time: 12/23/23  9:00 PM   Specimen: BLOOD RIGHT HAND  Result Value Ref Range Status   Specimen Description BLOOD RIGHT HAND  Final   Special Requests   Final    BOTTLES DRAWN AEROBIC AND ANAEROBIC  Blood Culture adequate volume   Culture   Final    NO GROWTH 4 DAYS Performed at [facility], [address], [place], [place] [address]    Report Status PENDING  Incomplete   Labs: CBC: Recent Labs  Lab 12/22/23 1419 12/23/23 2040 12/24/23 0206 12/25/23 0945 12/26/23 0516 12/27/23 0513  WBC 5.7 5.5 6.1 5.2 5.1 4.2  NEUTROABS 3.9  --   --   --   --  2.2  HGB 11.3* 12.7* 12.2* 11.6* 11.4* 10.9*  HCT 35.0* 39.4 36.9* 33.9* 33.4* 32.3*  MCV 104.8* 102.6* 100.0 98.3 98.8 97.9  PLT 126* 141* 139* 145* 141* 149*   Basic Metabolic Panel: Recent Labs  Lab 12/23/23 2040 12/24/23 0206 12/25/23 0945 12/26/23 0516 12/27/23 0513  NA 140 139 136 134* 139  K 4.5 4.2 4.1 3.9 3.9  CL 107 109 107 106 108  CO2 22 21* 19* 23 22  GLUCOSE 157* 161* 215* 172* 127*  BUN 18 15 19  24* 26*  CREATININE 1.22 1.14 1.19 1.39* 1.62*  CALCIUM  9.1 8.6* 8.5* 8.1* 8.8*  MG  --   --   --   --  2.0  PHOS  --   --  2.7 4.1 4.3   Liver Function Tests: Recent Labs  Lab 12/22/23 1419 12/25/23 0945 12/26/23 0516 12/27/23 0513  AST 18  --   --  19  ALT 15  --   --  11  ALKPHOS 89  --   --  67  BILITOT 0.7  --   --  0.9  PROT 6.5  --   --  6.0*  ALBUMIN 3.4* 3.0* 2.8* 3.0*   CBG: Recent Labs  Lab 12/26/23 1207 12/26/23 1644 12/26/23 1915 12/27/23 0756 12/27/23 1205  GLUCAP 167* 145* 174* 190* 124*   Discharge time spent: greater than 30 minutes.  Signed: Alejandro Marker, DO [facility] 12/27/2023

## 2023-12-27 NOTE — Plan of Care (Signed)

## 2023-12-27 NOTE — TOC Transition Note (Signed)
 Transition of Care [facility]) - Discharge Note   Patient Details  Name: Paul Kent MRN: [id number] Date of Birth: Aug 12, 1940  [facility] CM/SW Contact:  Lauraine FORBES Saa, LCSW Phone Number: 12/27/2023, 12:54 PM  Clinical Narrative:     Patient will DC to: [facility] Anticipated DC date: 12/27/2023 Family notified: Artavious Trebilcock; Spouse; [phone] Transport by: [facility]  Per MD patient ready for DC to [facility]. RN to call report prior to discharge [phone]). RN, patient's family, and facility notified of DC (patient is not fully oriented). Discharge Summary and FL2 sent to facility. DC packet on chart. Ambulance transport requested for patient at 12:52.   CSW will sign off for now as social work intervention is no longer needed. Please consult us  again if new needs arise.   Final next level of care: Skilled Nursing Facility Barriers to Discharge: Barriers Resolved   Patient Goals and CMS Choice   CMS Medicare.gov Compare Post Acute Care list provided to:: Patient Represenative (must comment) Choice offered to / list presented to : Spouse      Discharge Placement              Patient chooses bed at: [facility],  Patient to be transferred to facility by: [facility] Name of family member notified: Saber Dickerman; Spouse; [phone] Patient and family notified of of transfer: 12/27/23  Discharge Plan and Services Additional resources added to the After Visit Summary for   In-house Referral: Clinical Social Work   Post Acute Care Choice: Skilled Nursing Facility                               Social Drivers of Health (SDOH) Interventions SDOH Screenings   Food Insecurity: No Food Insecurity (12/24/2023)  Housing: Unknown (12/24/2023)  Transportation Needs: No Transportation Needs (12/24/2023)  Utilities: Not At Risk (12/24/2023)  Alcohol Screen: Low Risk  (05/30/2023)  Depression (PHQ2-9): Low Risk  (06/07/2023)   Financial Resource Strain: Low Risk  (05/30/2023)  Physical Activity: Sufficiently Active (05/30/2023)  Social Connections: Moderately Isolated (12/24/2023)  Stress: No Stress Concern Present (05/30/2023)  Tobacco Use: Low Risk  (12/26/2023)  Health Literacy: Adequate Health Literacy (05/30/2023)     Readmission Risk Interventions     No data to display

## 2023-12-28 DIAGNOSIS — R262 Difficulty in walking, not elsewhere classified: Secondary | ICD-10-CM | POA: Diagnosis not present

## 2023-12-28 DIAGNOSIS — I4891 Unspecified atrial fibrillation: Secondary | ICD-10-CM | POA: Diagnosis not present

## 2023-12-28 DIAGNOSIS — G9340 Encephalopathy, unspecified: Secondary | ICD-10-CM | POA: Diagnosis not present

## 2023-12-28 DIAGNOSIS — I5033 Acute on chronic diastolic (congestive) heart failure: Secondary | ICD-10-CM | POA: Diagnosis not present

## 2023-12-28 LAB — CULTURE, BLOOD (ROUTINE X 2)
Culture: NO GROWTH
Culture: NO GROWTH
Special Requests: ADEQUATE
Special Requests: ADEQUATE

## 2023-12-28 NOTE — Care Management Important Message (Signed)
 Important Message  Patient Details  Name: Paul Kent MRN: [id number] Date of Birth: 10/10/1940   Important Message Given:  Yes - Medicare IM     [name] 12/28/2023, 3:43 PM

## 2023-12-28 NOTE — Care Management Important Message (Signed)
 Important Message  Patient Details  Name: Paul Kent MRN: [id number] Date of Birth: [birth year]   Important Message Given:  Yes - Medicare IM     [name] 12/28/2023, 3:46 PM  Patient left prior to IM delivery will mail a copy to the patient home address.

## 2023-12-30 ENCOUNTER — Ambulatory Visit: Payer: Self-pay | Admitting: Family Medicine

## 2023-12-31 ENCOUNTER — Telehealth: Payer: Self-pay | Admitting: Family Medicine

## 2023-12-31 NOTE — Telephone Encounter (Signed)
 [facility] Order Summary Report

## 2024-01-01 ENCOUNTER — Telehealth: Payer: Self-pay

## 2024-01-01 ENCOUNTER — Other Ambulatory Visit: Payer: Self-pay

## 2024-01-01 ENCOUNTER — Ambulatory Visit: Admitting: Family Medicine

## 2024-01-01 NOTE — Transitions of Care (Post Inpatient/ED Visit) (Unsigned)
   01/01/2024  Name: Paul Kent MRN: [id number] DOB: 07-Mar-1941  Today's TOC FU Call Status: Today's TOC FU Call Status:: Unsuccessful Call (1st Attempt) Unsuccessful Call (1st Attempt) Date: 01/01/24  Attempted to reach the patient regarding the most recent Inpatient/ED visit.  Follow Up Plan: Additional outreach attempts will be made to reach the patient to complete the Transitions of Care (Post Inpatient/ED visit) call.   Signature Julian Lemmings, LPN [facility] Nurse Health Advisor Direct Dial [phone]

## 2024-01-02 NOTE — Transitions of Care (Post Inpatient/ED Visit) (Signed)
 01/02/2024  Name: Paul Kent MRN: [id number] DOB: 11-06-1940  Today's TOC FU Call Status: Today's TOC FU Call Status:: Successful TOC FU Call Completed Unsuccessful Call (1st Attempt) Date: 01/01/24 [facility] FU Call Complete Date: 01/02/24 Patient's Name and Date of Birth confirmed.  Transition Care Management Follow-up Telephone Call Date of Discharge: 12/31/23 Discharge Facility: Other [facility]) Name of Other (Non[facility]) Discharge Facility: [facility] Type of Discharge: Inpatient Admission How have you been since you were released from the hospital?: Better Any questions or concerns?: No  Items Reviewed: Did you receive and understand the discharge instructions provided?: Yes Medications obtained,verified, and reconciled?: Yes (Medications Reviewed) Any new allergies since your discharge?: No Dietary orders reviewed?: Yes Do you have support at home?: Yes People in Home [RPT]: spouse Name of Support/Comfort Primary Source: encephalopathy  Medications Reviewed Today: Medications Reviewed Today     Reviewed by Emmitt Pan, LPN (Licensed Practical Nurse) on 01/02/24 at 1535  Med List Status: <None>   Medication Order Taking? Sig Documenting Provider Last Dose Status Informant  acetaminophen  (TYLENOL ) 325 MG tablet [id number] Yes Take 2 tablets (650 mg total) by mouth every 6 (six) hours as needed for mild pain (pain score 1-3) or fever (or Fever >/= 101). Sheikh, Omair Latif, [place]  Active   amiodarone  (PACERONE ) 200 MG tablet [id number] Yes TAKE ONE TABLET BY MOUTH EVERY DAY Sirivol, Mamatha, MD  Active   atorvastatin  (LIPITOR) 10 MG tablet [id number] Yes TAKE ONE TABLET BY MOUTH EVERY EVENING Cox, Kirsten, MD  Active Spouse/Significant Other, Child  butalbital -acetaminophen -caffeine  (FIORICET ) 50-325-40 MG tablet [id number] Yes Take 1 tablet by mouth every 6 (six) hours as needed for headache. Sherrill Cable [place], [place]  Active   cyanocobalamin  1000 MCG tablet [id number] Yes  Take 1 tablet (1,000 mcg total) by mouth daily. Sheikh, Omair [place], [place]  Active   famotidine  (PEPCID ) 40 MG tablet [id number] Yes Take 40 mg by mouth daily. [provider]  Active Spouse/Significant Other, Child  gabapentin  (NEURONTIN ) 300 MG capsule [id number] Yes TAKE ONE CAPSULE BY MOUTH AT BEDTIME Cox, Kirsten, MD  Active Spouse/Significant Other, Child  glipiZIDE  (GLUCOTROL  XL) 10 MG 24 hr tablet [id number] Yes Take 10 mg by mouth 2 (two) times daily. [provider]  Active Spouse/Significant Other, Child  Insulin  Pen Needle (BD PEN NEEDLE MINI ULTRAFINE) 31G X 5 MM MISC [id number] Yes 1 each by Other route daily. Nicholaus Credit, PA-C  Active   levothyroxine  (SYNTHROID ) 137 MCG tablet [id number] Yes TAKE ONE TABLET BY MOUTH EVERY MORNING Cox, Kirsten, MD  Active Spouse/Significant Other, Child  ondansetron  (ZOFRAN ) 4 MG tablet [id number] Yes Take 1 tablet (4 mg total) by mouth every 6 (six) hours as needed for nausea. Sheikh, Omair Latif, DO  Active   primidone  (MYSOLINE ) 50 MG tablet [id number] Yes Take 1 tablet (50 mg total) by mouth 2 (two) times daily. Cox, Kirsten, MD  Active Spouse/Significant Other, Child  senna-docusate (SENOKOT-S) 8.6-50 MG tablet [id number] Yes Take 1 tablet by mouth at bedtime as needed for mild constipation. Sheikh, Omair [place], DO  Active   tamsulosin  (FLOMAX ) 0.4 MG CAPS capsule [id number] Yes TAKE ONE CAPSULE BY MOUTH EVERY EVENING Sirivol, Mamatha, MD  Active Spouse/Significant Other, Child  tirzepatide  (MOUNJARO ) 5 MG/0.5ML Pen [id number] Yes Inject 5 mg into the skin once a week. Cox, Kirsten, MD  Active Spouse/Significant Other, Child  torsemide  (DEMADEX ) 20 MG tablet [id number] Yes Take 20 mg by mouth daily as needed. [provider]  Active Spouse/Significant Other, Child  XARELTO  15 MG TABS tablet [id number] Yes TAKE ONE TABLET BY MOUTH EVERY EVENING Bernie Lamar PARAS, MD  Active Spouse/Significant Other, Child            Home Care and  Equipment/Supplies: Were Home Health Services Ordered?: NA Any new equipment or medical supplies ordered?: NA  Functional Questionnaire: Do you need assistance with bathing/showering or dressing?: No Do you need assistance with meal preparation?: No Do you need assistance with eating?: No Do you have difficulty maintaining continence: No Do you need assistance with getting out of bed/getting out of a chair/moving?: No Do you have difficulty managing or taking your medications?: No  Follow up appointments reviewed: PCP Follow-up appointment confirmed?: NA Specialist Hospital Follow-up appointment confirmed?: NA Do you need transportation to your follow-up appointment?: No Do you understand care options if your condition(s) worsen?: Yes-patient verbalized understanding  wife declined appt  SIGNATURE Julian Lemmings, LPN [facility] Nurse Health Advisor Direct Dial [phone]

## 2024-01-09 ENCOUNTER — Other Ambulatory Visit: Payer: Self-pay | Admitting: Family Medicine

## 2024-01-09 DIAGNOSIS — E1142 Type 2 diabetes mellitus with diabetic polyneuropathy: Secondary | ICD-10-CM

## 2024-01-09 DIAGNOSIS — K219 Gastro-esophageal reflux disease without esophagitis: Secondary | ICD-10-CM

## 2024-01-10 ENCOUNTER — Encounter: Payer: Self-pay | Admitting: Cardiology

## 2024-01-10 ENCOUNTER — Ambulatory Visit: Attending: Cardiology | Admitting: Cardiology

## 2024-01-10 VITALS — BP 130/76 | HR 60 | Ht 75.0 in | Wt 238.4 lb

## 2024-01-10 DIAGNOSIS — Z8679 Personal history of other diseases of the circulatory system: Secondary | ICD-10-CM | POA: Insufficient documentation

## 2024-01-10 DIAGNOSIS — Z950 Presence of cardiac pacemaker: Secondary | ICD-10-CM | POA: Insufficient documentation

## 2024-01-10 DIAGNOSIS — I480 Paroxysmal atrial fibrillation: Secondary | ICD-10-CM | POA: Insufficient documentation

## 2024-01-10 DIAGNOSIS — Z9889 Other specified postprocedural states: Secondary | ICD-10-CM | POA: Diagnosis not present

## 2024-01-10 DIAGNOSIS — I5032 Chronic diastolic (congestive) heart failure: Secondary | ICD-10-CM | POA: Insufficient documentation

## 2024-01-10 DIAGNOSIS — I4819 Other persistent atrial fibrillation: Secondary | ICD-10-CM | POA: Diagnosis not present

## 2024-01-10 NOTE — Patient Instructions (Signed)

## 2024-01-10 NOTE — Progress Notes (Signed)
 Cardiology Office Note:    Date:  01/10/2024   ID:  Taiquan, Campanaro [birth year], MRN [id number]  PCP:  Sherre Clapper, MD  Cardiologist:  Lamar Fitch, MD    Referring MD: Sherre Clapper, MD   Chief Complaint  Patient presents with   referral status     History of Present Illness:    Paul Kent is a 83 y.o. 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  He comes today 2 months for follow-up.  Recently he was put on some medication for twitching of the eye that led to confusion and he ended up being in the hospital with delirium.  Likely medication has been withdrawn and he improved to being himself again.  He is doing fine denies have any chest pain tightness squeezing pressure mid chest.  He said that he goes to gym on the regular basis however he comes to my office with a walker.  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) 04/26/2021   Corns and callosities    Deviated septum 07/01/2018   Deviated septum 07/01/2018   Diabetes mellitus due to underlying condition with unspecified complications (HCC) 06/08/2016   Diabetic ulcer of right heel associated with diabetes mellitus due to underlying condition, limited to breakdown of skin (HCC)  07/31/2021   Disturbances of salivary secretion    Epistaxis    Essential hypertension    Essential tremor    H pylori ulcer    H pylori ulcer    Herpes zoster with nervous system complication 06/03/2013   Hesitancy of micturition    Hyperlipidemia    Hypothyroidism    Impacted cerumen, bilateral    Jaw pain    Lesion of femoral nerve 04/30/2013   Localized edema    Male erectile disorder    Malignant 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)    Nasal congestion 07/01/2018   Nasal turbinate hypertrophy 07/01/2018   Obstructive sleep apnea 01/17/2017   Other constipation    Other fatigue    Overweight    Pacemaker reprogramming/check 02/12/2017   Pain in right finger(s)    Pain in right lower leg    Paroxysmal atrial fibrillation (HCC) 06/08/2016   Peptic ulcer disease    Persistent atrial fibrillation (HCC) 06/01/2017   Primary insomnia    Renal stones    Renal stones    Secondary hypercoagulable state (HCC) 06/17/2020   Shingles 2014   WITH COMPLICATIONS (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)   Shingles 06/2012   Shortness of breath    Sick sinus syndrome (HCC)    Sinus bradycardia 03/07/2017   Sleep apnea    dx 08/2015, CPAP   Spontaneous ecchymoses  Status post ablation of atrial fibrillation 09/16/2020   Testicular hypofunction    Thoracic or lumbosacral neuritis or radiculitis 06/03/2013   Trigger middle finger of right hand 11/28/2019   Trigger middle finger of right hand 11/28/2019   Type 2 diabetes mellitus with circulatory disorder, without long-term current use of insulin  (HCC) 01/17/2017    Past Surgical History:  Procedure Laterality Date   ABLATION  06/2017   ATRIAL FIBRILLATION ABLATION N/A 05/21/2020   Procedure: ATRIAL FIBRILLATION ABLATION;  Surgeon: Inocencio Soyla Lunger, MD;  Location: [facility] INVASIVE CV LAB;  Service: Cardiovascular;  Laterality: N/A;   CARDIOVERSION N/A 03/22/2018   Procedure: CARDIOVERSION;  Surgeon:  Lonni Slain, MD;  Location: [facility] ENDOSCOPY;  Service: Cardiovascular;  Laterality: N/A;   CARDIOVERSION N/A 07/08/2020   Procedure: CARDIOVERSION;  Surgeon: Jeffrie Oneil BROCKS, MD;  Location: [facility] ENDOSCOPY;  Service: Cardiovascular;  Laterality: N/A;   HEMIARTHROPLASTY HIP  10/31/2013   IT HIP BIPOLAR   LUMBAR SPINE SURGERY     PACEMAKER PLACEMENT  01/29/2017   SHOULDER SURGERY Right     Current Medications: Current Meds  Medication Sig   acetaminophen  (TYLENOL ) 325 MG tablet Take 2 tablets (650 mg total) by mouth every 6 (six) hours as needed for mild pain (pain score 1-3) or fever (or Fever >/= 101).   amiodarone  (PACERONE ) 200 MG tablet TAKE ONE TABLET BY MOUTH EVERY DAY   atorvastatin  (LIPITOR) 10 MG tablet TAKE ONE TABLET BY MOUTH EVERY EVENING   butalbital -acetaminophen -caffeine  (FIORICET ) 50-325-40 MG tablet Take 1 tablet by mouth every 6 (six) hours as needed for headache.   cyanocobalamin  1000 MCG tablet Take 1 tablet (1,000 mcg total) by mouth daily.   famotidine  (PEPCID ) 40 MG tablet TAKE ONE TABLET BY MOUTH DAILY   [Paused] gabapentin  (NEURONTIN ) 300 MG capsule TAKE ONE CAPSULE BY MOUTH AT BEDTIME   glipiZIDE  (GLUCOTROL  XL) 10 MG 24 hr tablet TAKE ONE TABLET BY MOUTH TWICE DAILY   Insulin  Pen Needle (BD PEN NEEDLE MINI ULTRAFINE) 31G X 5 MM MISC 1 each by Other route daily.   levothyroxine  (SYNTHROID ) 137 MCG tablet TAKE ONE TABLET BY MOUTH EVERY MORNING   ondansetron  (ZOFRAN ) 4 MG tablet Take 1 tablet (4 mg total) by mouth every 6 (six) hours as needed for nausea.   [Paused] primidone  (MYSOLINE ) 50 MG tablet Take 1 tablet (50 mg total) by mouth 2 (two) times daily.   senna-docusate (SENOKOT-S) 8.6-50 MG tablet Take 1 tablet by mouth at bedtime as needed for mild constipation.   tamsulosin  (FLOMAX ) 0.4 MG CAPS capsule TAKE ONE CAPSULE BY MOUTH EVERY EVENING   tirzepatide  (MOUNJARO ) 5 MG/0.5ML Pen Inject 5 mg into the skin once a week.   [Paused] torsemide  (DEMADEX ) 20 MG  tablet Take 20 mg by mouth daily as needed.   XARELTO  15 MG TABS tablet TAKE ONE TABLET BY MOUTH EVERY EVENING   [DISCONTINUED] famotidine  (PEPCID ) 40 MG tablet Take 40 mg by mouth daily.   [DISCONTINUED] glipiZIDE  (GLUCOTROL  XL) 10 MG 24 hr tablet Take 10 mg by mouth 2 (two) times daily.   [DISCONTINUED] levothyroxine  (SYNTHROID ) 137 MCG tablet TAKE ONE TABLET BY MOUTH EVERY MORNING     Allergies:   Propranolol, Canagliflozin, Clonidine derivatives, Hydralazine  hcl, Metformin and related, Topiramate, Ambien  [zolpidem ], Farxiga  [dapagliflozin ], and Ozempic  (0.25 or 0.5 mg-dose) [semaglutide (0.25 or 0.5mg -dos)]   Social History   Socioeconomic History   Marital status: Married    Spouse name: Ginnie   Number of children: 3   Years of  education: Not on file   Highest education level: Not on file  Occupational History   Occupation: RETIRED    Comment: school principal  Tobacco Use   Smoking status: Never   Smokeless tobacco: Never  Vaping Use   Vaping status: Never Used  Substance and Sexual Activity   Alcohol use: Not Currently   Drug use: Never   Sexual activity: Not Currently  Other Topics Concern   Not on file  Social History Narrative   Lives with wife   Social Drivers of Health   Financial Resource Strain: Low Risk  (05/30/2023)   Overall Financial Resource Strain (CARDIA)    Difficulty of Paying Living Expenses: Not hard at all  Food Insecurity: No Food Insecurity (12/24/2023)   Hunger Vital Sign    Worried About Running Out of Food in the Last Year: Never true    Ran Out of Food in the Last Year: Never true  Transportation Needs: No Transportation Needs (12/24/2023)   PRAPARE - Administrator, Civil Service (Medical): No    Lack of Transportation (Non-Medical): No  Physical Activity: Sufficiently Active (05/30/2023)   Exercise Vital Sign    Days of Exercise per Week: 4 days    Minutes of Exercise per Session: 60 min  Stress: No Stress Concern Present  (05/30/2023)   [employer] of Occupational Health - Occupational Stress Questionnaire    Feeling of Stress : Not at all  Social Connections: Moderately Isolated (12/24/2023)   Social Connection and Isolation Panel    Frequency of Communication with Friends and Family: Three times a week    Frequency of Social Gatherings with Friends and Family: Once a week    Attends Religious Services: Never    Database administrator or Organizations: No    Attends Engineer, structural: Never    Marital Status: Married     Family History: The patient's family history includes Arthritis in an other family member; Congestive Heart Failure in an other family member; Diabetes type II in an other family member; Heart Problems in his father; Heart attack in his mother; Hyperlipidemia in an other family member; Transient ischemic attack in his mother; Tuberculosis in his brother. ROS:   Please see the history of present illness.    All 14 point review of systems negative except as described per history of present illness  EKGs/Labs/Other Studies Reviewed:    EKG Interpretation Date/Time:  Thursday January 10 2024 12:59:27 EDT Ventricular Rate:  60 PR Interval:  258 QRS Duration:  188 QT Interval:  562 QTC Calculation: 562 R Axis:   36  Text Interpretation: AV dual-paced rhythm with prolonged AV conduction Abnormal ECG When compared with ECG of 22-Dec-2023 14:04, PREVIOUS ECG IS PRESENT Confirmed by Bernie Charleston [id number]) on 01/10/2024 1:08:18 PM    Recent Labs: 01/23/2023: NT-Pro BNP 3,185 12/22/2023: TSH 2.304 12/27/2023: ALT 11; BUN 26; Creatinine, Ser 1.62; Hemoglobin 10.9; Magnesium 2.0; Platelets 149; Potassium 3.9; Sodium 139  Recent Lipid Panel    Component Value Date/Time   CHOL 117 12/18/2023 0935   TRIG 71 12/18/2023 0935   HDL 50 12/18/2023 0935   CHOLHDL 2.3 12/18/2023 0935   LDLCALC 52 12/18/2023 0935    Physical Exam:    VS:  BP 130/76 (BP Location: Left Arm, Patient  Position: Sitting)   Pulse 60   Ht 6' 3 (1.905 m)   Wt 238 lb 6.4 oz (108.1 kg)   SpO2 96%   BMI 29.80  kg/m     Wt Readings from Last 3 Encounters:  01/10/24 238 lb 6.4 oz (108.1 kg)  12/27/23 251 lb 8.7 oz (114.1 kg)  12/18/23 248 lb (112.5 kg)     GEN:  Well nourished, well developed in no acute distress HEENT: Normal NECK: No JVD; No carotid bruits LYMPHATICS: No lymphadenopathy CARDIAC: RRR, no murmurs, no rubs, no gallops RESPIRATORY:  Clear to auscultation without rales, wheezing or rhonchi  ABDOMEN: Soft, non-tender, non-distended MUSCULOSKELETAL:  No edema; No deformity  SKIN: Warm and dry LOWER EXTREMITIES: no swelling NEUROLOGIC:  Alert and oriented x 3 PSYCHIATRIC:  Normal affect   ASSESSMENT:    1. Persistent atrial fibrillation (HCC)   2. Chronic heart failure with preserved ejection fraction (HFpEF, >= 50%) (HCC)   3. Paroxysmal atrial fibrillation (HCC)   4. Cardiac pacemaker in situ   5. Status post ablation of atrial fibrillation    PLAN:    In order of problems listed above:  Paroxysmal atrial fibrillation maintained sinus rhythm, on amiodarone .  Anticoagulated which I will continue. History of cardiomyopathy latest ejection fraction preserved. Dyslipidemia LDL 52 HDL 50 we will continue present medication which include Lipitor, Pacemaker present not normal function. Record from hospital reviewed for this visit   Medication Adjustments/Labs and Tests Ordered: Current medicines are reviewed at length with the patient today.  Concerns regarding medicines are outlined above.  Orders Placed This Encounter  Procedures   EKG 12-Lead   Medication changes: No orders of the defined types were placed in this encounter.   Signed, Lamar DOROTHA Fitch, MD, [facility] 01/10/2024 1:20 PM    [facility]

## 2024-02-12 ENCOUNTER — Other Ambulatory Visit: Payer: Self-pay | Admitting: Family Medicine

## 2024-02-12 DIAGNOSIS — K219 Gastro-esophageal reflux disease without esophagitis: Secondary | ICD-10-CM

## 2024-02-15 ENCOUNTER — Ambulatory Visit: Payer: Self-pay

## 2024-02-15 NOTE — Telephone Encounter (Signed)
 Called patient and he will call wound care at [facility]. He wants to know the phone number to make an appointment. He has been seen there this year. Recommended to call us  back if they need a referral from us .

## 2024-02-15 NOTE — Telephone Encounter (Signed)
 Talked with Dr Sirivol and she recommended to come to the office to be seen. He refused to come and he would like we sent the referral to wound care. I explained him that we need to see him to evaluate him and have documentation for the referral. He stated that he cannot come because he is handicap and he wants we send the message to Dr Sherre.  Lauren called him back after talked with Dr Sirivol and she explained him again why he has to be seen and he refused and asked to send the message to Dr Sherre. Please advice.

## 2024-02-15 NOTE — Telephone Encounter (Signed)
 Copied from CRM [id number]. Topic: Referral - Request for Referral >> Feb 15, 2024 10:47 AM Dedra B wrote: Did the patient discuss referral with their provider in the last year? Yes pt previously discussed foot issue with his provider and was told by someone in office that he didn't need a referral but wound care says he does   Appointment offered? No  Type of order/referral and detailed reason for visit: Wound care  Preference of office, provider, location: No preference  If referral order, have you been seen by this specialty before? No   Can we respond through MyChart? Yes

## 2024-02-15 NOTE — Telephone Encounter (Signed)
 FYI Only or Action Required?: Action required by provider: would like to have referral to wound clinic at [facility].  Patient was last seen in primary care on 12/18/2023 by Sherre Clapper, MD.  Called Nurse Triage reporting Wound Infection.  Symptoms began several weeks ago.  Interventions attempted: Nothing.  Symptoms are: gradually worsening.  Triage Disposition: See Physician Within 24 Hours  Patient/caregiver understands and will follow disposition?: No, wishes to speak with PCP            Copied from CRM [id number]. Topic: Clinical - Red Word Triage >> Feb 15, 2024  9:45 AM Tobias CROME wrote: Red Word that prompted transfer to Nurse Triage: Patient requesting referral to wound care, patient states he has a really bad foot & needs to go back there. [facility]   wound on bottom of foot that will not heal, possibly infected Reason for Disposition  [1] Pus or cloudy fluid draining from wound AND [2] no fever  Answer Assessment - Initial Assessment Questions 1. LOCATION: Where is the wound located?      Right foot and bottom heal 2. WOUND APPEARANCE: What does the wound look like?      Round, open and draining 3. SIZE: If redness is present, ask: What is the size of the red area? (Inches, centimeters, or compare to size of a coin)      Size of a fifty cent piece 4. SPREAD: What's changed in the last day?  Do you see any red streaks coming from the wound?     Pain when he walks on it 5. ONSET: When did it start to look infected?      unknown 6. MECHANISM: How did the wound start, what was the cause?     Seen by Dr. Sherre 7. PAIN: Do you have any pain?  If Yes, ask: How bad is the pain?  (e.g., Scale 1-10; mild, moderate, or severe)     8-9/10 8. FEVER: Do you have a fever? If Yes, ask: What is your temperature, how was it measured, and when did it start?     denies 9. OTHER SYMPTOMS: Do you have any other symptoms? (e.g., shaking chills,  weakness, rash elsewhere on body)     no 10. PREGNANCY: Is there any chance you are pregnant? When was your last menstrual period?       na  Protocols used: Wound Infection Suspected-A-AH

## 2024-02-19 ENCOUNTER — Telehealth: Payer: Self-pay | Admitting: Family Medicine

## 2024-02-19 NOTE — Telephone Encounter (Signed)
 Left detailed message informing patient to call us  back to get scheduled and that he does have to have a scheduled appointment to come in for a referral to wound care!

## 2024-02-19 NOTE — Telephone Encounter (Unsigned)
 Copied from CRM #[id number]. Topic: General - Other >> Feb 19, 2024  4:12 PM Donee H wrote: Reason for CRM: Patient stated he was returning missed call. He is stating he does not want to come in for an appointment and that sore on foot is something that has been cared for at Wound Care before. He stated it's hard for him to get around due to being handicap. Patient is requesting a callback to speak directly to Dr. Sherre nurse regarding referral. [phone]

## 2024-02-20 NOTE — Telephone Encounter (Signed)
 Patient called and scheduled an appointment for the referral to wound clinic.

## 2024-02-20 NOTE — Telephone Encounter (Signed)
 SEND TO OFFICE. DR Kionna Brier WANTS TO SPEAK TO THE WOUND CARE CENTER FROM [facility].  DR. Karla Vines LMTC BACK. KC

## 2024-02-21 NOTE — Telephone Encounter (Signed)
 Dr. Sherre spoke with wound center this morning.

## 2024-02-21 NOTE — Progress Notes (Signed)
 Remote pacemaker transmission.

## 2024-02-22 ENCOUNTER — Encounter: Payer: Self-pay | Admitting: Family Medicine

## 2024-02-22 ENCOUNTER — Ambulatory Visit (INDEPENDENT_AMBULATORY_CARE_PROVIDER_SITE_OTHER): Admitting: Family Medicine

## 2024-02-22 VITALS — BP 116/78 | HR 103 | Temp 98.2°F | Ht 75.0 in | Wt 247.0 lb

## 2024-02-22 DIAGNOSIS — Z23 Encounter for immunization: Secondary | ICD-10-CM | POA: Diagnosis not present

## 2024-02-22 DIAGNOSIS — L97412 Non-pressure chronic ulcer of right heel and midfoot with fat layer exposed: Secondary | ICD-10-CM

## 2024-02-22 DIAGNOSIS — L97512 Non-pressure chronic ulcer of other part of right foot with fat layer exposed: Secondary | ICD-10-CM | POA: Diagnosis not present

## 2024-02-22 DIAGNOSIS — E08621 Diabetes mellitus due to underlying condition with foot ulcer: Secondary | ICD-10-CM | POA: Diagnosis not present

## 2024-02-22 NOTE — Progress Notes (Signed)
 Subjective:  Patient ID: Paul Kent, male    DOB: [birth year]  Age: 83 y.o. MRN: [id number]  Chief Complaint  Patient presents with   Wound Check    HPI: Discussed the use of AI scribe software for clinical note transcription with the patient, who gave verbal consent to proceed.  History of Present Illness Paul Kent is an 83 year old male with diabetes who presents with foot ulcers.  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 Teresa - Eye symptoms are currently inactive  Easy bruising - Bruising present on the arms - Attributes bruising to 'real tough' blood - Wears long sleeve shirts to protect arms from bruising  Cognitive impairment - Memory issues with difficulty recalling names and events - Finds memory impairment frustrating, but happy to be alive.       06/07/2023   10:49 AM 03/15/2023    9:56 AM 02/06/2023   10:53 AM 10/26/2022    2:35 PM 02/13/2022    9:10 AM  Depression screen PHQ 2/9  Decreased Interest 0 0 0 0 0  Down, Depressed, Hopeless 0 0 0 0 0  PHQ - 2 Score 0 0 0 0 0  Altered sleeping  0  0   Tired, decreased energy  0  0   Change in appetite  0  0   Feeling bad or  failure about yourself   0  0   Trouble concentrating  0  0   Moving slowly or fidgety/restless  0  0   Suicidal thoughts  0  0   PHQ-9 Score  0  0   Difficult doing work/chores  Not difficult at all  Not difficult at all         06/07/2023   10:49 AM  Fall Risk   Falls in the past year? 1  Number falls in past yr: 0  Injury with Fall? 1  Risk for fall due to : Impaired balance/gait;History of fall(s)  Follow up Falls evaluation completed;Falls prevention discussed    Patient Care Team: Sherre Clapper, MD as PCP - General (Internal Medicine) Inocencio Soyla Lunger, MD as PCP - Electrophysiology (Cardiology) Bernie Lamar PARAS, MD as PCP - Cardiology (Cardiology) Marda General, MD as Consulting Physician (Urology) Erica Double, MD (Orthopedic Surgery) Kendell Marcey LELON, MD as Referring Physician (Family Medicine) Maudie Nest, OD (Optometry) Teresa Purchase, MD as Referring Physician (Ophthalmology)   Review of Systems  Current Outpatient Medications on File Prior to Visit  Medication Sig Dispense Refill   acetaminophen  (TYLENOL ) 325 MG tablet Take 2 tablets (650 mg total) by mouth every 6 (six) hours as needed for mild pain (pain score 1-3) or fever (or Fever >/= 101). 20 tablet  0   amiodarone  (PACERONE ) 200 MG tablet TAKE ONE TABLET BY MOUTH EVERY DAY 90 tablet 1   atorvastatin  (LIPITOR) 10 MG tablet TAKE ONE TABLET BY MOUTH EVERY EVENING 90 tablet 0   butalbital -acetaminophen -caffeine  (FIORICET ) 50-325-40 MG tablet Take 1 tablet by mouth every 6 (six) hours as needed for headache. 14 tablet 0   cyanocobalamin  1000 MCG tablet Take 1 tablet (1,000 mcg total) by mouth daily.     famotidine  (PEPCID ) 40 MG tablet TAKE ONE TABLET BY MOUTH DAILY 90 tablet 0   [Paused] gabapentin  (NEURONTIN ) 300 MG capsule TAKE ONE CAPSULE BY MOUTH AT BEDTIME 90 capsule 1   glipiZIDE  (GLUCOTROL  XL) 10 MG 24 hr tablet TAKE ONE TABLET BY MOUTH TWICE DAILY 180 tablet 0   Insulin  Pen Needle (BD PEN  NEEDLE MINI ULTRAFINE) 31G X 5 MM MISC 1 each by Other route daily. 100 each 1   levothyroxine  (SYNTHROID ) 137 MCG tablet TAKE ONE TABLET BY MOUTH EVERY MORNING 90 tablet 0   ondansetron  (ZOFRAN ) 4 MG tablet Take 1 tablet (4 mg total) by mouth every 6 (six) hours as needed for nausea.     [Paused] primidone  (MYSOLINE ) 50 MG tablet Take 1 tablet (50 mg total) by mouth 2 (two) times daily. 180 tablet 1   senna-docusate (SENOKOT-S) 8.6-50 MG tablet Take 1 tablet by mouth at bedtime as needed for mild constipation.     tamsulosin  (FLOMAX ) 0.4 MG CAPS capsule TAKE ONE CAPSULE BY MOUTH EVERY EVENING 30 capsule 1   tirzepatide  (MOUNJARO ) 5 MG/0.5ML Pen Inject 5 mg into the skin once a week. 6 mL 0   [Paused] torsemide  (DEMADEX ) 20 MG tablet Take 20 mg by mouth daily as needed.     XARELTO  15 MG TABS tablet TAKE ONE TABLET BY MOUTH EVERY EVENING 30 tablet 5   No current facility-administered medications on file prior to visit.   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) 04/26/2021   Corns and callosities    Deviated septum 07/01/2018   Deviated septum 07/01/2018   Diabetes mellitus due to underlying condition with unspecified complications (HCC) 06/08/2016   Diabetic ulcer of right heel associated with diabetes mellitus due to underlying condition, limited to breakdown of skin (HCC) 07/31/2021   Disturbances of salivary secretion    Epistaxis    Essential hypertension    Essential tremor    H pylori ulcer    H pylori ulcer    Herpes zoster with nervous system complication 06/03/2013    Hesitancy of micturition    Hyperlipidemia    Hypothyroidism    Impacted cerumen, bilateral    Jaw pain    Lesion of femoral nerve 04/30/2013   Localized edema    Male erectile disorder    Malignant 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)    Nasal congestion 07/01/2018   Nasal turbinate hypertrophy 07/01/2018   Obstructive sleep apnea 01/17/2017   Other constipation    Other fatigue    Overweight    Pacemaker reprogramming/check 02/12/2017  Pain in right finger(s)    Pain in right lower leg    Paroxysmal atrial fibrillation (HCC) 06/08/2016   Peptic ulcer disease    Persistent atrial fibrillation (HCC) 06/01/2017   Primary insomnia    Renal stones    Renal stones    Secondary hypercoagulable state (HCC) 06/17/2020   Shingles 2014   WITH COMPLICATIONS (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)   Shingles 06/2012   Shortness of breath    Sick sinus syndrome (HCC)    Sinus bradycardia 03/07/2017   Sleep apnea    dx 08/2015, CPAP   Spontaneous ecchymoses    Status post ablation of atrial fibrillation 09/16/2020   Testicular hypofunction    Thoracic or lumbosacral neuritis or radiculitis 06/03/2013   Trigger middle finger of right hand 11/28/2019   Trigger middle finger of right hand 11/28/2019   Type 2 diabetes mellitus with circulatory disorder, without long-term current use of insulin  (HCC) 01/17/2017   Past Surgical History:  Procedure Laterality Date   ABLATION  06/2017   ATRIAL FIBRILLATION ABLATION N/A 05/21/2020   Procedure: ATRIAL FIBRILLATION ABLATION;  Surgeon: Inocencio Soyla Lunger, MD;  Location: [facility]VASIVE [facility];  Service: Cardiovascular;  Laterality: N/A;   CARDIOVERSION N/A 03/22/2018   Procedure: CARDIOVERSION;  Surgeon: Lonni Slain, MD;  Location[facility];  Service: Cardiovascular;  Laterality: N/A;   CARDIOVERSION N/A 07/08/2020   Procedure: CARDIOVERSION;  Surgeon: Jeffrie Oneil BROCKS, MD;  Location: [facility] ENDOSCOPY;   Service: Cardiovascular;  Laterality: N/A;   HEMIARTHROPLASTY HIP  10/31/2013   IT HIP BIPOLAR   LUMBAR SPINE SURGERY     PACEMAKER PLACEMENT  01/29/2017   SHOULDER SURGERY Right     Family History  Problem Relation Age of Onset   Heart attack Mother    Transient ischemic attack Mother    Heart Problems Father    Tuberculosis Brother    Diabetes type II Other    Hyperlipidemia Other    Congestive Heart Failure Other    Arthritis Other    Social History   Socioeconomic History   Marital status: Married    Spouse name: Ginnie   Number of children: 3   Years of education: Not on file   Highest education level: Not on file  Occupational History   Occupation: RETIRED    Comment: school principal  Tobacco Use   Smoking status: Never   Smokeless tobacco: Never  Vaping Use   Vaping status: Never Used  Substance and Sexual Activity   Alcohol use: Not Currently   Drug use: Never   Sexual activity: Not Currently  Other Topics Concern   Not on file  Social History Narrative   Lives with wife   Social Drivers of Health   Financial Resource Strain: Low Risk  (05/30/2023)   Overall Financial Resource Strain (CARDIA)    Difficulty of Paying Living Expenses: Not hard at all  Food Insecurity: No Food Insecurity (12/24/2023)   Hunger Vital Sign    Worried About Running Out of Food in the Last Year: Never true    Ran Out of Food in the Last Year: Never true  Transportation Needs: No Transportation Needs (12/24/2023)   PRAPARE - Administrator, Civil Service (Medical): No    Lack of Transportation (Non-Medical): No  Physical Activity: Sufficiently Active (05/30/2023)   Exercise Vital Sign    Days of Exercise per Week: 4 days    Minutes of Exercise per Session: 60 min  Stress: No Stress Concern Present (05/30/2023)   [employer] of Occupational Health - Occupational Stress Questionnaire    Feeling of Stress : Not at all  Social Connections: Moderately Isolated  (12/24/2023)   Social Connection and Isolation Panel    Frequency of Communication with Friends and Family: Three times a week    Frequency of Social Gatherings with Friends and Family: Once a week    Attends Religious Services: Never    Database administrator or Organizations: No    Attends Engineer, structural: Never    Marital Status: Married    Objective:  BP 116/78   Pulse (!) 103   Temp 98.2 F (36.8 C)   Ht 6' 3 (1.905 m)   Wt 247 lb (112 kg)   SpO2 98%   BMI 30.87 kg/m      02/22/2024   11:17 AM 01/10/2024    1:01 PM 12/27/2023    7:56 AM  BP/Weight  Systolic BP 116 130 130  Diastolic BP 78 76 52  Wt. (Lbs) 247 238.4   BMI 30.87 kg/m2 29.8 kg/m2     Physical Exam Vitals reviewed.  Constitutional:      Appearance: Normal appearance.  Cardiovascular:     Rate and Rhythm: Normal rate and regular rhythm.     Heart sounds: Normal heart sounds.  Pulmonary:     Effort: Pulmonary effort is normal.     Breath sounds: Normal breath sounds. No wheezing, rhonchi or rales.  Skin:    Findings: Lesion present.  Neurological:     Mental Status: He is alert.  Psychiatric:        Mood and Affect: Mood normal.        Behavior: Behavior normal.              Lab Results  Component Value Date   WBC 4.2 12/27/2023   HGB 10.9 (L) 12/27/2023   HCT 32.3 (L) 12/27/2023   PLT 149 (L) 12/27/2023   GLUCOSE 127 (H) 12/27/2023   CHOL 117 12/18/2023   TRIG 71 12/18/2023   HDL 50 12/18/2023   LDLCALC 52 12/18/2023   ALT 11 12/27/2023   AST 19 12/27/2023   NA 139 12/27/2023   K 3.9 12/27/2023   CL 108 12/27/2023   CREATININE 1.62 (H) 12/27/2023   BUN 26 (H) 12/27/2023   CO2 22 12/27/2023   TSH 2.304 12/22/2023   HGBA1C 6.4 (H) 12/18/2023      Assessment & Plan:  Encounter for immunization -     Flu vaccine HIGH DOSE PF(Fluzone Trivalent)  Diabetic ulcer of right heel associated with diabetes mellitus due to underlying condition, with fat layer  exposed (HCC) Assessment & Plan: Non-pressure chronic ulcer of left heel and midfoot Chronic ulcer on left heel open with malodor, suggesting possible infection. Less severe than previous episodes but requires further evaluation. - Refer to wound care center for evaluation. - Instruct wound care center to contact his wife for appointment scheduling.    Skin ulcer of right great toe with fat layer exposed (HCC) Assessment & Plan: Non-pressure chronic ulcer of left heel and midfoot Chronic ulcer on left heel open with malodor, suggesting possible infection. Less severe than previous episodes but requires further evaluation. - Refer to wound care center for evaluation. - Instruct wound care center to contact his wife for appointment scheduling.           No orders of the defined types were placed in this  encounter.   Orders Placed This Encounter  Procedures   Flu vaccine HIGH DOSE PF(Fluzone Trivalent)     Follow-up: No follow-ups on file.   I,Jehad Bisono,acting as a Neurosurgeon for Abigail Free, MD.,have documented all relevant documentation on the behalf of Abigail Free, MD,as directed by  Abigail Free, MD while in the presence of Abigail Free, MD.   An After Visit Summary was printed and given to the patient.  Abigail Free, MD [facility] [phone]

## 2024-02-22 NOTE — Assessment & Plan Note (Addendum)
 Non-pressure chronic ulcer of left heel and midfoot Chronic ulcer on left heel open with malodor, suggesting possible infection. Less severe than previous episodes but requires further evaluation. - Refer to wound care center for evaluation. - Instruct wound care center to contact his wife for appointment scheduling.

## 2024-02-22 NOTE — Assessment & Plan Note (Signed)
 Non-pressure chronic ulcer of left heel and midfoot Chronic ulcer on left heel open with malodor, suggesting possible infection. Less severe than previous episodes but requires further evaluation. - Refer to wound care center for evaluation. - Instruct wound care center to contact his wife for appointment scheduling.

## 2024-02-27 ENCOUNTER — Telehealth: Payer: Self-pay | Admitting: Diagnostic Neuroimaging

## 2024-02-27 NOTE — Telephone Encounter (Signed)
 Pt called to cancel appt  due to  surgery   Appt Canceled

## 2024-02-28 DIAGNOSIS — E11621 Type 2 diabetes mellitus with foot ulcer: Secondary | ICD-10-CM | POA: Diagnosis not present

## 2024-02-28 DIAGNOSIS — L97512 Non-pressure chronic ulcer of other part of right foot with fat layer exposed: Secondary | ICD-10-CM | POA: Diagnosis not present

## 2024-02-28 DIAGNOSIS — L97412 Non-pressure chronic ulcer of right heel and midfoot with fat layer exposed: Secondary | ICD-10-CM | POA: Diagnosis not present

## 2024-02-28 DIAGNOSIS — L89610 Pressure ulcer of right heel, unstageable: Secondary | ICD-10-CM | POA: Diagnosis not present

## 2024-03-03 ENCOUNTER — Ambulatory Visit: Admitting: Diagnostic Neuroimaging

## 2024-03-11 ENCOUNTER — Other Ambulatory Visit: Payer: Self-pay | Admitting: Family Medicine

## 2024-03-13 DIAGNOSIS — L97412 Non-pressure chronic ulcer of right heel and midfoot with fat layer exposed: Secondary | ICD-10-CM | POA: Diagnosis not present

## 2024-03-13 DIAGNOSIS — L97512 Non-pressure chronic ulcer of other part of right foot with fat layer exposed: Secondary | ICD-10-CM | POA: Diagnosis not present

## 2024-03-13 DIAGNOSIS — E11621 Type 2 diabetes mellitus with foot ulcer: Secondary | ICD-10-CM | POA: Diagnosis not present

## 2024-03-13 DIAGNOSIS — L89610 Pressure ulcer of right heel, unstageable: Secondary | ICD-10-CM | POA: Diagnosis not present

## 2024-03-14 ENCOUNTER — Encounter

## 2024-03-19 ENCOUNTER — Other Ambulatory Visit: Payer: Self-pay | Admitting: Family Medicine

## 2024-03-20 ENCOUNTER — Ambulatory Visit (INDEPENDENT_AMBULATORY_CARE_PROVIDER_SITE_OTHER)

## 2024-03-20 DIAGNOSIS — L97412 Non-pressure chronic ulcer of right heel and midfoot with fat layer exposed: Secondary | ICD-10-CM | POA: Diagnosis not present

## 2024-03-20 DIAGNOSIS — E11621 Type 2 diabetes mellitus with foot ulcer: Secondary | ICD-10-CM | POA: Diagnosis not present

## 2024-03-20 DIAGNOSIS — I4819 Other persistent atrial fibrillation: Secondary | ICD-10-CM

## 2024-03-20 DIAGNOSIS — L89610 Pressure ulcer of right heel, unstageable: Secondary | ICD-10-CM | POA: Diagnosis not present

## 2024-03-20 DIAGNOSIS — L97512 Non-pressure chronic ulcer of other part of right foot with fat layer exposed: Secondary | ICD-10-CM | POA: Diagnosis not present

## 2024-03-24 ENCOUNTER — Other Ambulatory Visit: Payer: Self-pay | Admitting: Family Medicine

## 2024-03-28 ENCOUNTER — Ambulatory Visit: Payer: Self-pay | Admitting: Cardiology

## 2024-03-28 LAB — CUP PACEART REMOTE DEVICE CHECK
Battery Voltage: 35
Date Time Interrogation Session: 20251002100320
Implantable Lead Connection Status: 753985
Implantable Lead Connection Status: 753985
Implantable Lead Implant Date: 20180813
Implantable Lead Implant Date: 20180813
Implantable Lead Location: 753859
Implantable Lead Location: 753860
Implantable Lead Model: 377
Implantable Lead Model: 377
Implantable Lead Serial Number: 49893169
Implantable Lead Serial Number: 50011411
Implantable Pulse Generator Implant Date: 20180813
Pulse Gen Model: 407145
Pulse Gen Serial Number: 69158272

## 2024-03-28 NOTE — Progress Notes (Signed)
 Remote PPM Transmission

## 2024-04-03 DIAGNOSIS — L97412 Non-pressure chronic ulcer of right heel and midfoot with fat layer exposed: Secondary | ICD-10-CM | POA: Diagnosis not present

## 2024-04-03 DIAGNOSIS — L89610 Pressure ulcer of right heel, unstageable: Secondary | ICD-10-CM | POA: Diagnosis not present

## 2024-04-03 DIAGNOSIS — E11621 Type 2 diabetes mellitus with foot ulcer: Secondary | ICD-10-CM | POA: Diagnosis not present

## 2024-04-03 DIAGNOSIS — L97512 Non-pressure chronic ulcer of other part of right foot with fat layer exposed: Secondary | ICD-10-CM | POA: Diagnosis not present

## 2024-04-07 NOTE — Progress Notes (Signed)
 Subjective:  Patient ID: Paul Kent, male    DOB: December 09, 1940  Age: 83 y.o. MRN: [id number]  Chief Complaint  Patient presents with   Medical Management of Chronic Issues    HPI: Discussed the use of AI scribe software for clinical note transcription with the patient, who gave verbal consent to proceed.  History of Present Illness Paul Kent is an 83 year old male with diabetes and neuropathy who presents for routine follow-up and wound care management.  Diabetic foot ulcer and wound care - Attending weekly wound care appointments for foot ulcers, with improvement noted - Significant concern with second toe, x-ray to ruled out osteomyelitis - Completed 10-day course of prescribed antibiotic for toe wound - No fevers, chills, or sweats  Glycemic control - Diabetes mellitus with recent improvement in hemoglobin A1c to 5.9 (previously 6.4) - Weight loss of approximately 50 pounds over time, with recent weight of 243 pounds (down from 251 pounds in July) - Regular blood glucose monitoring with levels between 120 and 130 mg/dL - Mounjaro  5 mg once weekly - Occasional glipizide  use if blood sugar exceeds 150 mg/dL - No episodes of hypoglycemia; lowest blood glucose in the 90s  Peripheral neuropathy - Neuropathy in feet - Gabapentin  300 mg at bedtime for symptom management - Desires improved ambulation; currently uses assistance for mobility  Tremor - Tremor interferes with ability to write - Not sure if taking primidone  due to unawareness of its presence in medication pack - Previous prescription for primidone  expired in 2022  Cardiovascular history and symptoms - History of atrial fibrillation - Amiodarone  200 mg once daily - Xarelto  15 mg once daily - No chest pain or breathing problems - Torsemide  as needed for swelling; swelling is minimal  Gastrointestinal and genitourinary symptoms - Pepcid  for acid reflux - Senokot for constipation - No bowel problems -  Tamsulosin  for benign prostatic hyperplasia, which aids urination - No bladder issues  Lipid management - Atorvastatin  10 mg daily for hyperlipidemia  Ophthalmologic symptoms: blepharoplasm of BL eyes - Occasional visual disturbance, especially when watching TV - Symptom relief with use of sunglasses  Functional status and mood - Attends gym every other day and finds it beneficial - No recent falls - No depression or loss of interest in activities  Constitutional and ent symptoms - No fevers, chills, sweats, earache, sore throat, or stuffy nose       06/07/2023   10:49 AM 03/15/2023    9:56 AM 02/06/2023   10:53 AM 10/26/2022    2:35 PM 02/13/2022    9:10 AM  Depression screen PHQ 2/9  Decreased Interest 0 0 0 0 0  Down, Depressed, Hopeless 0 0 0 0 0  PHQ - 2 Score 0 0 0 0 0  Altered sleeping  0  0   Tired, decreased energy  0  0   Change in appetite  0  0   Feeling bad or failure about yourself   0  0   Trouble concentrating  0  0   Moving slowly or fidgety/restless  0  0   Suicidal thoughts  0  0   PHQ-9 Score  0  0   Difficult doing work/chores  Not difficult at all  Not difficult at all         06/07/2023   10:49 AM  Fall Risk   Falls in the past year? 1  Number falls in past yr: 0  Injury with Fall? 1  Risk for fall due to : Impaired balance/gait;History of fall(s)  Follow up Falls evaluation completed;Falls prevention discussed    Patient Care Team: Sherre Clapper, MD as PCP - General (Internal Medicine) Inocencio Soyla Lunger, MD as PCP - Electrophysiology (Cardiology) Bernie Lamar PARAS, MD as PCP - Cardiology (Cardiology) Marda General, MD as Consulting Physician (Urology) Erica Double, MD (Orthopedic Surgery) Kendell Marcey ORN, MD as Referring Physician (Family Medicine) Maudie Nest, OD (Optometry) Teresa Purchase, MD as Referring Physician (Ophthalmology)   Review of Systems  All other systems reviewed and are negative.   Current Outpatient  Medications on File Prior to Visit  Medication Sig Dispense Refill   acetaminophen  (TYLENOL ) 325 MG tablet Take 2 tablets (650 mg total) by mouth every 6 (six) hours as needed for mild pain (pain score 1-3) or fever (or Fever >/= 101). 20 tablet 0   amiodarone  (PACERONE ) 200 MG tablet TAKE ONE TABLET BY MOUTH EVERY DAY 90 tablet 1   atorvastatin  (LIPITOR) 10 MG tablet TAKE ONE TABLET BY MOUTH EVERY EVENING 90 tablet 0   butalbital -acetaminophen -caffeine  (FIORICET ) 50-325-40 MG tablet Take 1 tablet by mouth every 6 (six) hours as needed for headache. 14 tablet 0   cyanocobalamin  1000 MCG tablet Take 1 tablet (1,000 mcg total) by mouth daily.     famotidine  (PEPCID ) 40 MG tablet TAKE ONE TABLET BY MOUTH DAILY 90 tablet 0   gabapentin  (NEURONTIN ) 300 MG capsule TAKE ONE CAPSULE BY MOUTH AT BEDTIME 90 capsule 1   glipiZIDE  (GLUCOTROL  XL) 10 MG 24 hr tablet TAKE ONE TABLET BY MOUTH TWICE DAILY 180 tablet 0   Insulin  Pen Needle (BD PEN NEEDLE MINI ULTRAFINE) 31G X 5 MM MISC 1 each by Other route daily. 100 each 1   levothyroxine  (SYNTHROID ) 137 MCG tablet TAKE ONE TABLET BY MOUTH EVERY MORNING 90 tablet 0   ondansetron  (ZOFRAN ) 4 MG tablet Take 1 tablet (4 mg total) by mouth every 6 (six) hours as needed for nausea.     primidone  (MYSOLINE ) 50 MG tablet TAKE ONE TABLET BY MOUTH EVERY DAY and TAKE ONE TABLET BY MOUTH AT BEDTIME 180 tablet 1   senna-docusate (SENOKOT-S) 8.6-50 MG tablet Take 1 tablet by mouth at bedtime as needed for mild constipation.     tamsulosin  (FLOMAX ) 0.4 MG CAPS capsule TAKE ONE CAPSULE BY MOUTH EVERY EVENING 30 capsule 1   tirzepatide  (MOUNJARO ) 5 MG/0.5ML Pen Inject 5 mg into the skin once a week. 6 mL 0   XARELTO  15 MG TABS tablet TAKE ONE TABLET BY MOUTH EVERY EVENING 30 tablet 5   No current facility-administered medications on file prior to visit.   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   Cellulitis of right lower limb    CHF (congestive heart failure) (HCC) 02/2018   Chronic atrial fibrillation (HCC)    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) 04/26/2021   Corns and callosities    Deviated septum 07/01/2018   Deviated septum 07/01/2018   Diabetes mellitus due to underlying condition with unspecified complications (HCC) 06/08/2016   Diabetic ulcer of right heel associated with diabetes mellitus due to underlying condition, limited to breakdown of skin (HCC) 07/31/2021   Disturbances  of salivary secretion    Epistaxis    Essential hypertension    Essential tremor    H pylori ulcer    H pylori ulcer    Herpes zoster with nervous system complication 06/03/2013   Hesitancy of micturition    Hyperlipidemia    Hypothyroidism    Impacted cerumen, bilateral    Jaw pain    Lesion of femoral nerve 04/30/2013   Localized edema    Male erectile disorder    Malignant 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)    Nasal congestion 07/01/2018   Nasal turbinate hypertrophy 07/01/2018   Obstructive sleep apnea 01/17/2017   Other constipation    Other fatigue    Overweight    Pacemaker reprogramming/check 02/12/2017   Pain in right finger(s)    Pain in right lower leg    Paroxysmal atrial fibrillation (HCC) 06/08/2016   Peptic ulcer disease    Persistent atrial fibrillation (HCC) 06/01/2017   Primary insomnia    Renal stones    Renal stones    Secondary hypercoagulable state 06/17/2020   Shingles 2014   WITH COMPLICATIONS (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)   Shingles 06/2012   Shortness of breath    Sick sinus syndrome (HCC)    Sinus bradycardia 03/07/2017   Sleep apnea    dx 08/2015, CPAP   Spontaneous ecchymoses    Status post ablation of atrial fibrillation  09/16/2020   Testicular hypofunction    Thoracic or lumbosacral neuritis or radiculitis 06/03/2013   Trigger middle finger of right hand 11/28/2019   Trigger middle finger of right hand 11/28/2019   Type 2 diabetes mellitus with circulatory disorder, without long-term current use of insulin  (HCC) 01/17/2017   Past Surgical History:  Procedure Laterality Date   ABLATION  06/2017   ATRIAL FIBRILLATION ABLATION N/A 05/21/2020   Procedure: ATRIAL FIBRILLATION ABLATION;  Surgeon: Inocencio Soyla Lunger, MD;  Location: [facility] INVASIVE CV LAB;  Service: Cardiovascular;  Laterality: N/A;   CARDIOVERSION N/A 03/22/2018   Procedure: CARDIOVERSION;  Surgeon: Lonni Slain, MD;  Location: [facility] ENDOSCOPY;  Service: Cardiovascular;  Laterality: N/A;   CARDIOVERSION N/A 07/08/2020   Procedure: CARDIOVERSION;  Surgeon: Jeffrie Oneil BROCKS, MD;  Location: [facility] ENDOSCOPY;  Service: Cardiovascular;  Laterality: N/A;   HEMIARTHROPLASTY HIP  10/31/2013   IT HIP BIPOLAR   LUMBAR SPINE SURGERY     PACEMAKER PLACEMENT  01/29/2017   SHOULDER SURGERY Right     Family History  Problem Relation Age of Onset   Heart attack Mother    Transient ischemic attack Mother    Heart Problems Father    Tuberculosis Brother    Diabetes type II Other    Hyperlipidemia Other    Congestive Heart Failure Other    Arthritis Other    Social History   Socioeconomic History   Marital status: Married    Spouse name: Ginnie   Number of children: 3   Years of education: Not on file   Highest education level: Not on file  Occupational History   Occupation: RETIRED    Comment: school principal  Tobacco Use   Smoking status: Never   Smokeless tobacco: Never  Vaping Use   Vaping status: Never Used  Substance and Sexual Activity   Alcohol use: Not Currently   Drug use: Never   Sexual activity: Not Currently  Other Topics Concern   Not on file  Social History Narrative   Lives with wife   Social Drivers  of Health    Financial Resource Strain: Low Risk  (05/30/2023)   Overall Financial Resource Strain (CARDIA)    Difficulty of Paying Living Expenses: Not hard at all  Food Insecurity: No Food Insecurity (12/24/2023)   Hunger Vital Sign    Worried About Running Out of Food in the Last Year: Never true    Ran Out of Food in the Last Year: Never true  Transportation Needs: No Transportation Needs (12/24/2023)   PRAPARE - Administrator, Civil Service (Medical): No    Lack of Transportation (Non-Medical): No  Physical Activity: Sufficiently Active (05/30/2023)   Exercise Vital Sign    Days of Exercise per Week: 4 days    Minutes of Exercise per Session: 60 min  Stress: No Stress Concern Present (05/30/2023)   [employer] of Occupational Health - Occupational Stress Questionnaire    Feeling of Stress : Not at all  Social Connections: Moderately Isolated (12/24/2023)   Social Connection and Isolation Panel    Frequency of Communication with Friends and Family: Three times a week    Frequency of Social Gatherings with Friends and Family: Once a week    Attends Religious Services: Never    Database Administrator or Organizations: No    Attends Engineer, Structural: Never    Marital Status: Married    Objective:  BP (!) 112/58   Pulse 78   Temp 98.1 F (36.7 C)   Ht 6' 3 (1.905 m)   Wt 243 lb (110.2 kg)   SpO2 100%   BMI 30.37 kg/m      04/08/2024    8:10 AM 02/22/2024   11:17 AM 01/10/2024    1:01 PM  BP/Weight  Systolic BP 112 116 130  Diastolic BP 58 78 76  Wt. (Lbs) 243 247 238.4  BMI 30.37 kg/m2 30.87 kg/m2 29.8 kg/m2    Physical Exam Vitals reviewed.  Constitutional:      Appearance: Normal appearance.  Neck:     Vascular: No carotid bruit.  Cardiovascular:     Rate and Rhythm: Normal rate and regular rhythm.     Pulses: Normal pulses.     Heart sounds: Normal heart sounds.  Pulmonary:     Effort: Pulmonary effort is normal.     Breath sounds:  Normal breath sounds. No wheezing, rhonchi or rales.  Abdominal:     General: Bowel sounds are normal.     Palpations: Abdomen is soft.     Tenderness: There is no abdominal tenderness.  Neurological:     Mental Status: He is alert and oriented to person, place, and time.     Gait: Gait abnormal.  Psychiatric:        Mood and Affect: Mood normal.        Behavior: Behavior normal.    SEEING WOUND CARE WEEKLY FOR FEET AND PER PATIENT THEY LOOK AT BOTH FEET.    Lab Results  Component Value Date   WBC 4.2 12/27/2023   HGB 10.9 (L) 12/27/2023   HCT 32.3 (L) 12/27/2023   PLT 149 (L) 12/27/2023   GLUCOSE 127 (H) 12/27/2023   CHOL 117 12/18/2023   TRIG 71 12/18/2023   HDL 50 12/18/2023   LDLCALC 52 12/18/2023   ALT 11 12/27/2023   AST 19 12/27/2023   NA 139 12/27/2023   K 3.9 12/27/2023   CL 108 12/27/2023   CREATININE 1.62 (H) 12/27/2023   BUN 26 (H) 12/27/2023   CO2 22  12/27/2023   TSH 2.304 12/22/2023   HGBA1C 5.9 04/08/2024    Results for orders placed or performed in visit on 04/08/24  POCT Lipid Panel   Collection Time: 04/08/24  8:40 AM  Result Value Ref Range   TC 103    HDL 45    TRG 80    LDL 42    Non-HDL 58    TC/HDL    POCT glycosylated hemoglobin (Hb A1C)   Collection Time: 04/08/24  8:40 AM  Result Value Ref Range   Hemoglobin A1C     HbA1c POC (<> result, manual entry) 5.9 4.0 - 5.6 %   HbA1c, POC (prediabetic range)     HbA1c, POC (controlled diabetic range)    .  Assessment & Plan:   Assessment & Plan Acquired hypothyroidism Managed with thyroid  medication. Plans to check thyroid  levels during this visit.   - Check thyroid  levels   Orders:   T4, free   TSH   OSA on CPAP Compliant and benefiting from use.  Hopefully tirzepatide  will help.     Hyperlipidemia associated with type 2 diabetes mellitus (HCC) Diabetes well-controlled with A1c of 5.9. Blood glucose stable. Neuropathy stable. Foot ulcer improving. Osteomyelitis ruled out.  Completed antibiotics with no infection signs. Mounjaro  dose increase considered for weight loss. - Increase Mounjaro  to 7.5 mg weekly for weight loss. - Advise to halve glipizide  if needed with increased Mounjaro  dose. - Continue weekly wound care visits for foot ulcer.  Hyperlipidemia is at goal.  Continue atorvastatin  10 mg before bed.   Orders:   POCT Lipid Panel   POCT glycosylated hemoglobin (Hb A1C)   CBC with Differential/Platelet   Comprehensive metabolic panel with GFR   T4, free  Chronic combined systolic and diastolic congestive heart failure (HCC) Doing well. Only using torsemide  20 mg daily prn.  Consider addition of entresto. Discuss with Dr. Krasowski. You should have no more than 1.5 to 2 litres of fluid in a day.     Paroxysmal atrial fibrillation (HCC) Managed with amiodarone  and Xarelto .    Encounter for immunization  Orders:   Pfizer Comirnaty Covid-19 Vaccine 35yrs & older  Pressure ulcer of right heel, stage 3 (HCC) Management per specialist.  Seeing wound care.     History of bladder cancer - 2011: resection and bcg treatment.  - no recurrence on surveillance cystocopies. Management per specialist.        Body mass index is 30.37 kg/m.     No orders of the defined types were placed in this encounter.   Orders Placed This Encounter  Procedures   Pfizer Comirnaty Covid-19 Vaccine 28yrs & older   CBC with Differential/Platelet   Comprehensive metabolic panel with GFR   T4, free   TSH   POCT Lipid Panel   POCT glycosylated hemoglobin (Hb A1C)       Follow-up: Return in about 4 months (around 08/09/2024) for chronic follow up.  An After Visit Summary was printed and given to the patient.  Abigail Free, MD [facility] [phone]

## 2024-04-07 NOTE — Assessment & Plan Note (Addendum)
 Managed with amiodarone  and Xarelto .

## 2024-04-07 NOTE — Assessment & Plan Note (Addendum)
 Managed with thyroid  medication. Plans to check thyroid  levels during this visit.   - Check thyroid  levels   Orders:   T4, free   TSH

## 2024-04-07 NOTE — Assessment & Plan Note (Addendum)
 Compliant and benefiting from use.  Hopefully tirzepatide  will help.

## 2024-04-07 NOTE — Assessment & Plan Note (Addendum)
 Diabetes well-controlled with A1c of 5.9. Blood glucose stable. Neuropathy stable. Foot ulcer improving. Osteomyelitis ruled out. Completed antibiotics with no infection signs. Mounjaro  dose increase considered for weight loss. - Increase Mounjaro  to 7.5 mg weekly for weight loss. - Advise to halve glipizide  if needed with increased Mounjaro  dose. - Continue weekly wound care visits for foot ulcer.  Hyperlipidemia is at goal.  Continue atorvastatin  10 mg before bed.   Orders:   POCT Lipid Panel   POCT glycosylated hemoglobin (Hb A1C)   CBC with Differential/Platelet   Comprehensive metabolic panel with GFR   T4, free

## 2024-04-07 NOTE — Assessment & Plan Note (Addendum)
 Doing well. Only using torsemide  20 mg daily prn.  Consider addition of entresto. Discuss with Dr. Krasowski. You should have no more than 1.5 to 2 litres of fluid in a day.

## 2024-04-08 ENCOUNTER — Encounter: Payer: Self-pay | Admitting: Family Medicine

## 2024-04-08 ENCOUNTER — Ambulatory Visit (INDEPENDENT_AMBULATORY_CARE_PROVIDER_SITE_OTHER): Admitting: Family Medicine

## 2024-04-08 ENCOUNTER — Ambulatory Visit: Admitting: Family Medicine

## 2024-04-08 VITALS — BP 112/58 | HR 78 | Temp 98.1°F | Ht 75.0 in | Wt 243.0 lb

## 2024-04-08 DIAGNOSIS — E785 Hyperlipidemia, unspecified: Secondary | ICD-10-CM

## 2024-04-08 DIAGNOSIS — Z23 Encounter for immunization: Secondary | ICD-10-CM | POA: Diagnosis not present

## 2024-04-08 DIAGNOSIS — I480 Paroxysmal atrial fibrillation: Secondary | ICD-10-CM

## 2024-04-08 DIAGNOSIS — E039 Hypothyroidism, unspecified: Secondary | ICD-10-CM

## 2024-04-08 DIAGNOSIS — E1169 Type 2 diabetes mellitus with other specified complication: Secondary | ICD-10-CM

## 2024-04-08 DIAGNOSIS — L89613 Pressure ulcer of right heel, stage 3: Secondary | ICD-10-CM

## 2024-04-08 DIAGNOSIS — I5042 Chronic combined systolic (congestive) and diastolic (congestive) heart failure: Secondary | ICD-10-CM

## 2024-04-08 DIAGNOSIS — G4733 Obstructive sleep apnea (adult) (pediatric): Secondary | ICD-10-CM | POA: Diagnosis not present

## 2024-04-08 DIAGNOSIS — Z8551 Personal history of malignant neoplasm of bladder: Secondary | ICD-10-CM | POA: Diagnosis not present

## 2024-04-08 LAB — POCT GLYCOSYLATED HEMOGLOBIN (HGB A1C): HbA1c POC (<> result, manual entry): 5.9 % (ref 4.0–5.6)

## 2024-04-08 LAB — POCT LIPID PANEL
HDL: 45
LDL: 42
Non-HDL: 58
TC: 103
TRG: 80

## 2024-04-10 DIAGNOSIS — L97412 Non-pressure chronic ulcer of right heel and midfoot with fat layer exposed: Secondary | ICD-10-CM | POA: Diagnosis not present

## 2024-04-10 DIAGNOSIS — L89610 Pressure ulcer of right heel, unstageable: Secondary | ICD-10-CM | POA: Diagnosis not present

## 2024-04-10 DIAGNOSIS — E11621 Type 2 diabetes mellitus with foot ulcer: Secondary | ICD-10-CM | POA: Diagnosis not present

## 2024-04-10 DIAGNOSIS — L97512 Non-pressure chronic ulcer of other part of right foot with fat layer exposed: Secondary | ICD-10-CM | POA: Diagnosis not present

## 2024-04-11 ENCOUNTER — Other Ambulatory Visit: Payer: Self-pay

## 2024-04-11 ENCOUNTER — Telehealth: Payer: Self-pay | Admitting: Cardiology

## 2024-04-11 ENCOUNTER — Other Ambulatory Visit: Payer: Self-pay | Admitting: Family Medicine

## 2024-04-11 MED ORDER — TORSEMIDE 20 MG PO TABS: 20.0000 mg | ORAL_TABLET | Freq: Every day | ORAL | 2 refills | Status: AC | PRN

## 2024-04-11 NOTE — Telephone Encounter (Signed)
*  STAT* If patient is at the pharmacy, call can be transferred to refill team.   1. Which medications need to be refilled? (please list name of each medication and dose if known) torsemide  (DEMADEX ) 20 MG tablet   2. Which pharmacy/location (including street and city if local pharmacy) is medication to be sent to? [facility] - [place], [place] - [address]  3. Do they need a 30 day or 90 day supply?  90 day supply  Per Mliss, patient is completely out of medication.

## 2024-04-11 NOTE — Telephone Encounter (Signed)
 RX sent in

## 2024-04-13 DIAGNOSIS — L89613 Pressure ulcer of right heel, stage 3: Secondary | ICD-10-CM | POA: Insufficient documentation

## 2024-04-13 NOTE — Assessment & Plan Note (Signed)
 Management per specialist.  Seeing wound care.

## 2024-04-13 NOTE — Patient Instructions (Signed)
  VISIT SUMMARY: You had a follow-up visit to review your diabetes, neuropathy, and wound care. Your diabetes is well-controlled, and your foot ulcer is improving. We discussed your medications and made some adjustments to help with weight loss and tremor management.  YOUR PLAN: TYPE 2 DIABETES MELLITUS WITH NEUROPATHY AND FOOT ULCER: Your diabetes is well-controlled with an A1c of 5.9, and your foot ulcer is improving. There is no sign of bone infection. -Increase Mounjaro  to 7.5 mg weekly for weight loss. -If needed, halve the dose of glipizide  with the increased Mounjaro  dose. -Continue attending weekly wound care visits for your foot ulcer.  PAROXYSMAL ATRIAL FIBRILLATION: Your atrial fibrillation is managed with amiodarone  and Xarelto .  CHRONIC COMBINED SYSTOLIC AND DIASTOLIC HEART FAILURE: You have no significant swelling, and you use Torsemide  as needed.  HYPOTHYROIDISM: Your hypothyroidism is managed with levothyroxine . -We will check your thyroid  function with blood work.  BENIGN PROSTATIC HYPERPLASIA WITH LOWER URINARY TRACT SYMPTOMS: Your urinary symptoms are improving with tamsulosin .  TREMOR: Your tremor affects your ability to write. You have a prescription for primidone , but you have not been taking it. -Check your medication blister pack for primidone . -Discuss with the pharmacy if primidone  is not present in the blister pack.  GENERAL HEALTH MAINTENANCE: You are maintaining regular exercise and have no recent falls or depressive symptoms. -We will perform blood work to check your kidney function, liver function, and complete blood count. -Continue with healthy eating and regular exercise.                      Contains text generated by Abridge.                                 Contains text generated by Abridge.

## 2024-04-13 NOTE — Assessment & Plan Note (Signed)
-   2011: resection and bcg treatment.  - no recurrence on surveillance cystocopies. Management per specialist.

## 2024-04-17 DIAGNOSIS — L89610 Pressure ulcer of right heel, unstageable: Secondary | ICD-10-CM | POA: Diagnosis not present

## 2024-04-17 DIAGNOSIS — L97512 Non-pressure chronic ulcer of other part of right foot with fat layer exposed: Secondary | ICD-10-CM | POA: Diagnosis not present

## 2024-04-17 DIAGNOSIS — L97412 Non-pressure chronic ulcer of right heel and midfoot with fat layer exposed: Secondary | ICD-10-CM | POA: Diagnosis not present

## 2024-04-17 DIAGNOSIS — E11621 Type 2 diabetes mellitus with foot ulcer: Secondary | ICD-10-CM | POA: Diagnosis not present

## 2024-04-22 ENCOUNTER — Telehealth: Payer: Self-pay | Admitting: Cardiology

## 2024-04-22 ENCOUNTER — Other Ambulatory Visit: Payer: Self-pay | Admitting: Family Medicine

## 2024-04-22 DIAGNOSIS — K219 Gastro-esophageal reflux disease without esophagitis: Secondary | ICD-10-CM

## 2024-04-22 DIAGNOSIS — E1142 Type 2 diabetes mellitus with diabetic polyneuropathy: Secondary | ICD-10-CM

## 2024-04-22 NOTE — Telephone Encounter (Signed)
 Pt c/o medication issue:  1. Name of Medication:   XARELTO  15 MG TABS tablet  primidone  (MYSOLINE ) 50 MG tablet   2. How are you currently taking this medication (dosage and times per day)? As written   3. Are you having a reaction (difficulty breathing--STAT)? No   4. What is your medication issue? Tina with [facility] called in stating there is a drug interaction with these two meds. She asked if this is okay or not to fill.

## 2024-04-24 DIAGNOSIS — L97412 Non-pressure chronic ulcer of right heel and midfoot with fat layer exposed: Secondary | ICD-10-CM | POA: Diagnosis not present

## 2024-04-24 DIAGNOSIS — E11621 Type 2 diabetes mellitus with foot ulcer: Secondary | ICD-10-CM | POA: Diagnosis not present

## 2024-04-24 DIAGNOSIS — L89613 Pressure ulcer of right heel, stage 3: Secondary | ICD-10-CM | POA: Diagnosis not present

## 2024-04-25 NOTE — Telephone Encounter (Signed)
 Called pharmacy and spoke with tina and made her aware of provider recommendations and also called and left patient detailed message and for him to call office with any questions.

## 2024-04-30 ENCOUNTER — Other Ambulatory Visit: Payer: Self-pay | Admitting: Family Medicine

## 2024-05-01 DIAGNOSIS — E11621 Type 2 diabetes mellitus with foot ulcer: Secondary | ICD-10-CM | POA: Diagnosis not present

## 2024-05-01 DIAGNOSIS — L97412 Non-pressure chronic ulcer of right heel and midfoot with fat layer exposed: Secondary | ICD-10-CM | POA: Diagnosis not present

## 2024-05-01 DIAGNOSIS — L89613 Pressure ulcer of right heel, stage 3: Secondary | ICD-10-CM | POA: Diagnosis not present

## 2024-05-02 ENCOUNTER — Other Ambulatory Visit: Payer: Self-pay

## 2024-05-02 DIAGNOSIS — Z794 Long term (current) use of insulin: Secondary | ICD-10-CM

## 2024-05-05 ENCOUNTER — Other Ambulatory Visit: Payer: Self-pay | Admitting: Family Medicine

## 2024-05-05 MED ORDER — TIRZEPATIDE 7.5 MG/0.5ML ~~LOC~~ SOAJ: 7.5000 mg | SUBCUTANEOUS | 0 refills | Status: AC

## 2024-05-08 ENCOUNTER — Other Ambulatory Visit (HOSPITAL_BASED_OUTPATIENT_CLINIC_OR_DEPARTMENT_OTHER): Admitting: Radiology

## 2024-05-08 ENCOUNTER — Other Ambulatory Visit (HOSPITAL_BASED_OUTPATIENT_CLINIC_OR_DEPARTMENT_OTHER): Payer: Self-pay | Admitting: Family Medicine

## 2024-05-08 DIAGNOSIS — N179 Acute kidney failure, unspecified: Secondary | ICD-10-CM | POA: Diagnosis present

## 2024-05-08 DIAGNOSIS — Z8711 Personal history of peptic ulcer disease: Secondary | ICD-10-CM | POA: Diagnosis not present

## 2024-05-08 DIAGNOSIS — I739 Peripheral vascular disease, unspecified: Secondary | ICD-10-CM | POA: Diagnosis not present

## 2024-05-08 DIAGNOSIS — L97412 Non-pressure chronic ulcer of right heel and midfoot with fat layer exposed: Secondary | ICD-10-CM | POA: Diagnosis not present

## 2024-05-08 DIAGNOSIS — I5042 Chronic combined systolic (congestive) and diastolic (congestive) heart failure: Secondary | ICD-10-CM | POA: Diagnosis present

## 2024-05-08 DIAGNOSIS — Z7985 Long-term (current) use of injectable non-insulin antidiabetic drugs: Secondary | ICD-10-CM | POA: Diagnosis not present

## 2024-05-08 DIAGNOSIS — Z950 Presence of cardiac pacemaker: Secondary | ICD-10-CM | POA: Diagnosis not present

## 2024-05-08 DIAGNOSIS — Z7984 Long term (current) use of oral hypoglycemic drugs: Secondary | ICD-10-CM | POA: Diagnosis not present

## 2024-05-08 DIAGNOSIS — L0390 Cellulitis, unspecified: Secondary | ICD-10-CM | POA: Diagnosis not present

## 2024-05-08 DIAGNOSIS — I454 Nonspecific intraventricular block: Secondary | ICD-10-CM | POA: Diagnosis not present

## 2024-05-08 DIAGNOSIS — L89616 Pressure-induced deep tissue damage of right heel: Secondary | ICD-10-CM | POA: Diagnosis present

## 2024-05-08 DIAGNOSIS — L97514 Non-pressure chronic ulcer of other part of right foot with necrosis of bone: Secondary | ICD-10-CM | POA: Diagnosis present

## 2024-05-08 DIAGNOSIS — M869 Osteomyelitis, unspecified: Secondary | ICD-10-CM | POA: Diagnosis not present

## 2024-05-08 DIAGNOSIS — E785 Hyperlipidemia, unspecified: Secondary | ICD-10-CM | POA: Diagnosis present

## 2024-05-08 DIAGNOSIS — E1122 Type 2 diabetes mellitus with diabetic chronic kidney disease: Secondary | ICD-10-CM | POA: Diagnosis present

## 2024-05-08 DIAGNOSIS — N183 Chronic kidney disease, stage 3 unspecified: Secondary | ICD-10-CM | POA: Diagnosis present

## 2024-05-08 DIAGNOSIS — Z7901 Long term (current) use of anticoagulants: Secondary | ICD-10-CM | POA: Diagnosis not present

## 2024-05-08 DIAGNOSIS — Z87442 Personal history of urinary calculi: Secondary | ICD-10-CM | POA: Diagnosis not present

## 2024-05-08 DIAGNOSIS — Z79899 Other long term (current) drug therapy: Secondary | ICD-10-CM | POA: Diagnosis not present

## 2024-05-08 DIAGNOSIS — E1169 Type 2 diabetes mellitus with other specified complication: Secondary | ICD-10-CM | POA: Diagnosis present

## 2024-05-08 DIAGNOSIS — E034 Atrophy of thyroid (acquired): Secondary | ICD-10-CM | POA: Diagnosis present

## 2024-05-08 DIAGNOSIS — M86171 Other acute osteomyelitis, right ankle and foot: Secondary | ICD-10-CM | POA: Diagnosis present

## 2024-05-08 DIAGNOSIS — L97519 Non-pressure chronic ulcer of other part of right foot with unspecified severity: Secondary | ICD-10-CM | POA: Diagnosis not present

## 2024-05-08 DIAGNOSIS — I4891 Unspecified atrial fibrillation: Secondary | ICD-10-CM | POA: Diagnosis present

## 2024-05-08 DIAGNOSIS — M79674 Pain in right toe(s): Secondary | ICD-10-CM | POA: Diagnosis not present

## 2024-05-08 DIAGNOSIS — L97518 Non-pressure chronic ulcer of other part of right foot with other specified severity: Secondary | ICD-10-CM | POA: Diagnosis not present

## 2024-05-08 DIAGNOSIS — E11621 Type 2 diabetes mellitus with foot ulcer: Secondary | ICD-10-CM

## 2024-05-08 DIAGNOSIS — B957 Other staphylococcus as the cause of diseases classified elsewhere: Secondary | ICD-10-CM | POA: Diagnosis present

## 2024-05-08 DIAGNOSIS — E08621 Diabetes mellitus due to underlying condition with foot ulcer: Secondary | ICD-10-CM | POA: Diagnosis not present

## 2024-05-08 DIAGNOSIS — I130 Hypertensive heart and chronic kidney disease with heart failure and stage 1 through stage 4 chronic kidney disease, or unspecified chronic kidney disease: Secondary | ICD-10-CM | POA: Diagnosis present

## 2024-05-08 DIAGNOSIS — Z89421 Acquired absence of other right toe(s): Secondary | ICD-10-CM | POA: Diagnosis not present

## 2024-05-09 DIAGNOSIS — I454 Nonspecific intraventricular block: Secondary | ICD-10-CM | POA: Diagnosis not present

## 2024-05-12 ENCOUNTER — Telehealth: Payer: Self-pay

## 2024-05-12 DIAGNOSIS — M79671 Pain in right foot: Secondary | ICD-10-CM | POA: Diagnosis not present

## 2024-05-12 DIAGNOSIS — Z9889 Other specified postprocedural states: Secondary | ICD-10-CM | POA: Diagnosis not present

## 2024-05-12 DIAGNOSIS — Z89421 Acquired absence of other right toe(s): Secondary | ICD-10-CM | POA: Diagnosis not present

## 2024-05-12 NOTE — Transitions of Care (Post Inpatient/ED Visit) (Signed)
   05/12/2024  Name: Paul Kent MRN: [id number] DOB: 02/20/1941  Today's TOC FU Call Status: Today's TOC FU Call Status:: Successful TOC FU Call Completed TOC FU Call Complete Date: 05/12/24 (Spoke with wife- explained TOC program - wife declined - TOC RN encouraged wife to schedule hospital f/u with Dr Davina - wife states patient has appt today with podiatrist and she will call and schedule with PCP)  Patient's Name and Date of Birth confirmed. 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

## 2024-05-13 ENCOUNTER — Other Ambulatory Visit: Payer: Self-pay | Admitting: Cardiology

## 2024-05-13 NOTE — Telephone Encounter (Signed)
 Prescription refill request for Xarelto  received.  Indication:afib Last office visit:7/25 Weight:110.2  kg Age:83 Scr:1.62  7/25 CrCl:51.96  ml/min  Prescription refilled

## 2024-05-14 DIAGNOSIS — L89613 Pressure ulcer of right heel, stage 3: Secondary | ICD-10-CM | POA: Diagnosis not present

## 2024-05-14 DIAGNOSIS — L97412 Non-pressure chronic ulcer of right heel and midfoot with fat layer exposed: Secondary | ICD-10-CM | POA: Diagnosis not present

## 2024-05-17 ENCOUNTER — Other Ambulatory Visit: Payer: Self-pay | Admitting: Family Medicine

## 2024-05-20 DIAGNOSIS — L97412 Non-pressure chronic ulcer of right heel and midfoot with fat layer exposed: Secondary | ICD-10-CM | POA: Diagnosis not present

## 2024-05-21 ENCOUNTER — Ambulatory Visit: Payer: Self-pay

## 2024-05-21 NOTE — Telephone Encounter (Signed)
 FYI Only or Action Required?: FYI only for provider: appointment scheduled on 05/22/2024.  Patient was last seen in primary care on 04/08/2024 by Sherre Clapper, MD.  Called Nurse Triage reporting Dysuria.  Symptoms began several days ago.  Interventions attempted: Nothing.  Symptoms are: unchanged.  Triage Disposition: See Physician Within 24 Hours  Patient/caregiver understands and will follow disposition?: Yes  Copied from CRM #[id number]. Topic: Clinical - Red Word Triage >> May 21, 2024  2:51 PM Antwanette L wrote: Red Word that prompted transfer to Nurse Triage: Possible uti.Patient is reporting pain when urinating Reason for Disposition  All other males with painful urination  Answer Assessment - Initial Assessment Questions 1. SEVERITY: How bad is the pain?  (e.g., Scale 1-10; mild, moderate, or severe)     6-7 when urinating.  2. FREQUENCY: How many times have you had painful urination today?      4-5 3. PATTERN: Is pain present every time you urinate or just sometimes?      Only with urination 4. ONSET: When did the painful urination start?      Couple days ago 5. FEVER: Do you have a fever? If Yes, ask: What is your temperature, how was it measured, and when did it start?     Denies 6. PAST UTI: Have you had a urine infection before? If Yes, ask: When was the last time? and What happened that time?      Has had before and feels like that.  Protocols used: Urination Pain - Male-A-AH

## 2024-05-22 ENCOUNTER — Ambulatory Visit

## 2024-05-26 ENCOUNTER — Telehealth: Payer: Self-pay

## 2024-05-26 MED ORDER — PRIMIDONE 50 MG PO TABS: 50.0000 mg | ORAL_TABLET | Freq: Two times a day (BID) | ORAL | 1 refills | Status: DC

## 2024-05-26 NOTE — Telephone Encounter (Unsigned)
 Copied from CRM #[id number]. Topic: Clinical - Prescription Issue >> May 26, 2024  4:14 PM Hadassah PARAS wrote: Reason for CRM: Ellouise with [facility] is calling to clarify the reason pt was represcribed primidone  (MYSOLINE ) 50 MG tablet. This medication was previously discontinued due to it interacting with XARELTO  15 MG TABS tablet. Please provide clarification to Ellouise on #  [phone]

## 2024-05-26 NOTE — Telephone Encounter (Signed)
 Spoke with Ginnie, patient's wife, she stated [facility] stated that they did not have rx and would not fill out as patient has been on medication to long. Told patient we are Sending it over to [facility] now and if they tell her that again to give us  a call.  She verbalized understanding and had no questions at this time.  Copied from CRM #[id number]. Topic: Clinical - Prescription Issue >> May 26, 2024 11:33 AM Selinda RAMAN wrote: Reason for CRM: Ginnie the spouse of the patient called in stating her husband has been taking primidone  (MYSOLINE ) 50 MG tablet a long time for his tremors but recently was told the pharmacy will no longer fill it. She is very concerned because that medication truly helped him and she Is not sure what to do at this time. Please assist patient further and let her know when it has been addressed.

## 2024-05-28 ENCOUNTER — Other Ambulatory Visit: Payer: Self-pay

## 2024-05-28 NOTE — Telephone Encounter (Signed)
 Discontinue medication, will call pharmacy will they open at 9

## 2024-05-28 NOTE — Telephone Encounter (Signed)
 Called left message for patient to call office back

## 2024-05-28 NOTE — Telephone Encounter (Signed)
 Paul Kent with prevo drug called in to Cardiology about this medication. Informed her per the note, Dr. Sherre states to discontinue Primidone . She asked if someone from PCP office can call the patient and inform him because pt's wife thinks he is supposed to be on it. Please advise, thanks!!

## 2024-05-30 ENCOUNTER — Other Ambulatory Visit: Payer: Self-pay | Admitting: Family Medicine

## 2024-05-30 MED ORDER — GABAPENTIN 300 MG PO CAPS: 300.0000 mg | ORAL_CAPSULE | Freq: Three times a day (TID) | ORAL | 1 refills | Status: AC

## 2024-06-01 NOTE — Telephone Encounter (Signed)
 I spoke with Paul Kent, patient's wife. Discussed interaction between xarelto  and primidone .  Recommend stop primidone .  Increase gabapentin . New prescription sent with message to cancel primidone . Paul Kent expressed understanding and agreement. Dr. Sherre

## 2024-06-02 ENCOUNTER — Other Ambulatory Visit: Payer: Self-pay

## 2024-06-09 ENCOUNTER — Ambulatory Visit: Admitting: Family Medicine

## 2024-06-09 ENCOUNTER — Encounter: Payer: Self-pay | Admitting: Family Medicine

## 2024-06-09 ENCOUNTER — Ambulatory Visit: Payer: Self-pay

## 2024-06-09 VITALS — BP 118/68 | HR 75 | Temp 98.0°F | Resp 18 | Ht 75.0 in | Wt 234.4 lb

## 2024-06-09 DIAGNOSIS — R300 Dysuria: Secondary | ICD-10-CM | POA: Diagnosis not present

## 2024-06-09 DIAGNOSIS — N3001 Acute cystitis with hematuria: Secondary | ICD-10-CM

## 2024-06-09 DIAGNOSIS — R350 Frequency of micturition: Secondary | ICD-10-CM | POA: Diagnosis not present

## 2024-06-09 DIAGNOSIS — H6123 Impacted cerumen, bilateral: Secondary | ICD-10-CM | POA: Diagnosis not present

## 2024-06-09 LAB — POCT URINALYSIS DIP (CLINITEK)
Glucose, UA: NEGATIVE mg/dL
Ketones, POC UA: NEGATIVE mg/dL
Nitrite, UA: POSITIVE — AB
POC PROTEIN,UA: 100 — AB
Spec Grav, UA: 1.02
Urobilinogen, UA: 0.2 U/dL
pH, UA: 6

## 2024-06-09 MED ORDER — CIPROFLOXACIN HCL 250 MG PO TABS: 250.0000 mg | ORAL_TABLET | Freq: Two times a day (BID) | ORAL | 0 refills | Status: AC

## 2024-06-09 NOTE — Telephone Encounter (Signed)
 FYI Only or Action Required?: FYI only for provider: appointment scheduled on 06/09/2024.  Patient was last seen in primary care on 04/08/2024 by Sherre Clapper, MD.  Called Nurse Triage reporting Dysuria.  Symptoms began a week ago.  Interventions attempted: Nothing.  Symptoms are: gradually worsening.  Triage Disposition: See Physician Within 24 Hours  Patient/caregiver understands and will follow disposition?:     Copied from CRM #[id number]. Topic: Clinical - Red Word Triage >> Jun 09, 2024  7:46 AM Larissa RAMAN wrote: [facility] that prompted transfer to Nurse Triage: frequent urination with pain and burning Reason for Disposition  Pus (white, yellow) or bloody discharge from end of penis  Answer Assessment - Initial Assessment Questions 1. SEVERITY: How bad is the pain?  (e.g., Scale 1-10; mild, moderate, or severe)     moderate 2. FREQUENCY: How many times have you had painful urination today?      each 3. PATTERN: Is pain present every time you urinate or just sometimes?      constant 4. ONSET: When did the painful urination start?      X week 5. FEVER: Do you have a fever? If Yes, ask: What is your temperature, how was it measured, and when did it start?     na 6. PAST UTI: Have you had a urine infection before? If Yes, ask: When was the last time? and What happened that time?      na 7. CAUSE: What do you think is causing the painful urination?      unsure 8. OTHER SYMPTOMS: Do you have any other symptoms? (e.g., flank pain, penis discharge, scrotal pain, blood in urine)     Weak, increased frequency to urinate,  Protocols used: Urination Pain - Male-A-AH

## 2024-06-09 NOTE — Progress Notes (Unsigned)
 [facility]  Acute Office Visit  Subjective:    Patient ID: Paul Kent, male    DOB: [birth year], 83 y.o.   MRN: [id number]  Chief Complaint  Patient presents with   Dysuria    HPI: Patient is in today for ***  Discussed the use of AI scribe software for clinical note transcription with the patient, who gave verbal consent to proceed.      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) 04/26/2021   Corns and callosities    Deviated septum 07/01/2018   Deviated septum 07/01/2018   Diabetes mellitus due to underlying condition with unspecified complications (HCC) 06/08/2016   Diabetic ulcer of right heel associated with diabetes mellitus due to underlying condition, limited to breakdown of skin (HCC) 07/31/2021   Disturbances of salivary secretion    Epistaxis    Essential hypertension    Essential tremor    H pylori ulcer    H pylori ulcer    Herpes zoster with nervous system complication 06/03/2013   Hesitancy of micturition    Hyperlipidemia    Hypothyroidism    Impacted cerumen, bilateral    Jaw pain    Lesion of femoral nerve 04/30/2013   Localized edema    Male erectile disorder    Malignant 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)    Nasal congestion 07/01/2018   Nasal turbinate hypertrophy 07/01/2018   Obstructive sleep apnea 01/17/2017   Other constipation    Other fatigue    Overweight    Pacemaker reprogramming/check 02/12/2017   Pain in right finger(s)    Pain in right lower leg    Paroxysmal atrial  fibrillation (HCC) 06/08/2016   Peptic ulcer disease    Persistent atrial fibrillation (HCC) 06/01/2017   Primary insomnia    Renal stones    Renal stones    Secondary hypercoagulable state 06/17/2020   Shingles 2014   WITH COMPLICATIONS (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)   Shingles 06/2012   Shortness of breath    Sick sinus syndrome (HCC)    Sinus bradycardia 03/07/2017   Sleep apnea    dx 08/2015, CPAP   Spontaneous ecchymoses    Status post ablation of atrial fibrillation 09/16/2020   Testicular hypofunction    Thoracic or lumbosacral neuritis or radiculitis 06/03/2013   Trigger middle finger of right hand 11/28/2019   Trigger middle finger of right hand 11/28/2019   Type 2 diabetes mellitus with circulatory disorder, without long-term current use of insulin  (HCC) 01/17/2017    Past Surgical History:  Procedure Laterality Date   ABLATION  06/2017   ATRIAL FIBRILLATION ABLATION N/A 05/21/2020   Procedure: ATRIAL FIBRILLATION ABLATION;  Surgeon: Inocencio Soyla Lunger, MD;  Location: [facility] INVASIVE CV LAB;  Service: Cardiovascular;  Laterality: N/A;   CARDIOVERSION N/A 03/22/2018   Procedure: CARDIOVERSION;  Surgeon: Lonni Slain, MD;  Location: [facility] ENDOSCOPY;  Service: Cardiovascular;  Laterality: N/A;   CARDIOVERSION N/A 07/08/2020   Procedure: CARDIOVERSION;  Surgeon: Jeffrie Oneil BROCKS, MD;  Location: [facility] ENDOSCOPY;  Service: Cardiovascular;  Laterality: N/A;   HEMIARTHROPLASTY HIP  10/31/2013   IT HIP BIPOLAR   LUMBAR SPINE SURGERY     PACEMAKER PLACEMENT  01/29/2017   SHOULDER SURGERY Right     Family History  Problem Relation Age of Onset   Heart attack Mother    Transient ischemic attack Mother    Heart Problems Father    Tuberculosis Brother    Diabetes type II Other    Hyperlipidemia Other    Congestive Heart Failure Other    Arthritis Other     Social History   Socioeconomic History   Marital  status: Married    Spouse name: Ginnie   Number of children: 3   Years of education: Not on file   Highest education level: Not on file  Occupational History   Occupation: RETIRED    Comment: school principal  Tobacco Use   Smoking status: Never   Smokeless tobacco: Never  Vaping Use   Vaping status: Never Used  Substance and Sexual Activity   Alcohol use: Not Currently   Drug use: Never   Sexual activity: Not Currently  Other Topics Concern   Not on file  Social History Narrative   Lives with wife   Social Drivers of Health   Tobacco Use: Low Risk (06/09/2024)   Patient History    Smoking Tobacco Use: Never    Smokeless Tobacco Use: Never    Passive Exposure: Not on file  Financial Resource Strain: Low Risk (05/30/2023)   Overall Financial Resource Strain (CARDIA)    Difficulty of Paying Living Expenses: Not hard at all  Food Insecurity: No Food Insecurity (12/24/2023)   Epic    Worried About Radiation Protection Practitioner of Food in the Last Year: Never true    Ran Out of Food in the Last Year: Never true  Transportation Needs: No Transportation Needs (12/24/2023)   Epic    Lack of Transportation (Medical): No    Lack of Transportation (Non-Medical): No  Physical Activity: Sufficiently Active (05/30/2023)   Exercise Vital Sign    Days of Exercise per Week: 4 days    Minutes of Exercise per Session: 60 min  Stress: No Stress Concern Present (05/30/2023)   [employer] of Occupational Health - Occupational Stress Questionnaire    Feeling of Stress : Not at all  Social Connections: Moderately Isolated (12/24/2023)   Social Connection and Isolation Panel    Frequency of Communication with Friends and Family: Three times a week    Frequency of Social Gatherings with Friends and Family: Once a week    Attends Religious Services: Never    Database Administrator or Organizations: No    Attends Banker Meetings: Never    Marital Status: Married   Catering Manager Violence: Not At Risk (12/24/2023)   Epic    Fear of Current or Ex-Partner: No    Emotionally Abused: No    Physically Abused: No    Sexually Abused: No  Depression (PHQ2-9): Low Risk (06/07/2023)   Depression (PHQ2-9)    PHQ-2 Score: 0  Alcohol Screen: Low Risk (05/30/2023)   Alcohol Screen    Last Alcohol Screening Score (AUDIT): 0  Housing: Low Risk (02/22/2024)   Epic    Unable to Pay for Housing in the Last Year: No    Number of Times Moved in the Last Year: 0    Homeless in the Last Year: No  Utilities: Not At Risk (12/24/2023)  Epic    Threatened with loss of utilities: No  Health Literacy: Adequate Health Literacy (05/30/2023)   B1300 Health Literacy    Frequency of need for help with medical instructions: Never    Outpatient Medications Prior to Visit  Medication Sig Dispense Refill   acetaminophen  (TYLENOL ) 325 MG tablet Take 2 tablets (650 mg total) by mouth every 6 (six) hours as needed for mild pain (pain score 1-3) or fever (or Fever >/= 101). 20 tablet 0   amiodarone  (PACERONE ) 200 MG tablet TAKE ONE TABLET BY MOUTH EVERY DAY 90 tablet 1   atorvastatin  (LIPITOR) 10 MG tablet TAKE ONE TABLET BY MOUTH EVERY EVENING 90 tablet 1   butalbital -acetaminophen -caffeine  (FIORICET ) 50-325-40 MG tablet Take 1 tablet by mouth every 6 (six) hours as needed for headache. 14 tablet 0   cyanocobalamin  1000 MCG tablet Take 1 tablet (1,000 mcg total) by mouth daily.     famotidine  (PEPCID ) 40 MG tablet TAKE ONE TABLET BY MOUTH DAILY 90 tablet 1   gabapentin  (NEURONTIN ) 300 MG capsule Take 1 capsule (300 mg total) by mouth 3 (three) times daily. 90 capsule 1   glipiZIDE  (GLUCOTROL  XL) 10 MG 24 hr tablet TAKE ONE TABLET BY MOUTH TWICE DAILY 180 tablet 1   Insulin  Pen Needle (BD PEN NEEDLE MINI ULTRAFINE) 31G X 5 MM MISC 1 each by Other route daily. 100 each 1   levothyroxine  (SYNTHROID ) 137 MCG tablet TAKE ONE TABLET BY MOUTH EVERY MORNING 90 tablet 0    ondansetron  (ZOFRAN ) 4 MG tablet Take 1 tablet (4 mg total) by mouth every 6 (six) hours as needed for nausea.     senna-docusate (SENOKOT-S) 8.6-50 MG tablet Take 1 tablet by mouth at bedtime as needed for mild constipation.     tamsulosin  (FLOMAX ) 0.4 MG CAPS capsule TAKE ONE CAPSULE BY MOUTH EVERY EVENING 30 capsule 1   tirzepatide  (MOUNJARO ) 7.5 MG/0.5ML Pen Inject 7.5 mg into the skin once a week. 6 mL 0   torsemide  (DEMADEX ) 20 MG tablet Take 1 tablet (20 mg total) by mouth daily as needed. 90 tablet 2   XARELTO  15 MG TABS tablet TAKE ONE TABLET BY MOUTH EVERY EVENING 30 tablet 5   No facility-administered medications prior to visit.    Allergies[1]  Review of Systems  Constitutional:  Positive for chills and fatigue. Negative for appetite change and fever.  HENT:  Negative for congestion, ear pain, sinus pressure and sore throat.   Eyes: Negative.   Respiratory:  Negative for cough, chest tightness, shortness of breath and wheezing.   Cardiovascular:  Negative for chest pain and palpitations.  Gastrointestinal:  Negative for abdominal pain, constipation, diarrhea, nausea and vomiting.  Endocrine: Negative.   Genitourinary:  Positive for dysuria, frequency and urgency. Negative for hematuria.  Musculoskeletal:  Negative for arthralgias, back pain, joint swelling and myalgias.  Skin:  Negative for rash.  Allergic/Immunologic: Negative.   Neurological:  Negative for dizziness, weakness and headaches.  Psychiatric/Behavioral:  Negative for dysphoric mood. The patient is not nervous/anxious.        Objective:        06/09/2024   10:13 AM 04/08/2024    8:10 AM 02/22/2024   11:17 AM  Vitals with BMI  Height 6' 3 6' 3 6' 3  Weight 234 lbs 6 oz 243 lbs 247 lbs  BMI 29.3 30.37 30.87  Systolic 118 112 883  Diastolic 68 58 78  Pulse 75 78 103    No data found.  Physical Exam  Health Maintenance Due  Topic Date Due   DTaP/Tdap/Td (1 - Tdap) Never done    Medicare Annual Wellness (AWV)  06/06/2024   OPHTHALMOLOGY EXAM  05/27/2024    There are no preventive care reminders to display for this patient.   Lab Results  Component Value Date   TSH 2.304 12/22/2023   Lab Results  Component Value Date   WBC 4.2 12/27/2023   HGB 10.9 (L) 12/27/2023   HCT 32.3 (L) 12/27/2023   MCV 97.9 12/27/2023   PLT 149 (L) 12/27/2023   Lab Results  Component Value Date   NA 139 12/27/2023   K 3.9 12/27/2023   CO2 22 12/27/2023   GLUCOSE 127 (H) 12/27/2023   BUN 26 (H) 12/27/2023   CREATININE 1.62 (H) 12/27/2023   BILITOT 0.9 12/27/2023   ALKPHOS 67 12/27/2023   AST 19 12/27/2023   ALT 11 12/27/2023   PROT 6.0 (L) 12/27/2023   ALBUMIN 3.0 (L) 12/27/2023   CALCIUM  8.8 (L) 12/27/2023   ANIONGAP 9 12/27/2023   EGFR 36 (L) 12/18/2023   Lab Results  Component Value Date   CHOL 117 12/18/2023   Lab Results  Component Value Date   HDL 50 12/18/2023   Lab Results  Component Value Date   LDLCALC 52 12/18/2023   Lab Results  Component Value Date   TRIG 71 12/18/2023   Lab Results  Component Value Date   CHOLHDL 2.3 12/18/2023   Lab Results  Component Value Date   HGBA1C 5.9 04/08/2024        Results for orders placed or performed in visit on 04/08/24  POCT Lipid Panel   Collection Time: 04/08/24  8:40 AM  Result Value Ref Range   TC 103    HDL 45    TRG 80    LDL 42    Non-HDL 58    TC/HDL    POCT glycosylated hemoglobin (Hb A1C)   Collection Time: 04/08/24  8:40 AM  Result Value Ref Range   Hemoglobin A1C     HbA1c POC (<> result, manual entry) 5.9 4.0 - 5.6 %   HbA1c, POC (prediabetic range)     HbA1c, POC (controlled diabetic range)       Assessment & Plan:   Assessment & Plan Dysuria  Orders:   POCT URINALYSIS DIP (CLINITEK)   Urine Culture  Acute cystitis with hematuria  Orders:   ciprofloxacin  (CIPRO ) 250 MG tablet; Take 1 tablet (250 mg total) by mouth 2 (two) times daily for 5 days.     Body  mass index is 29.3 kg/m.SABRA  Assessment and Plan      No orders of the defined types were placed in this encounter.   No orders of the defined types were placed in this encounter.    Follow-up: No follow-ups on file.  An After Visit Summary was printed and given to the patient.  Harrie CHRISTELLA Cedar, FNP [facility] [phone]       [1] Allergies Allergen Reactions   Propranolol     Drops HR too low  Other reaction(s): Other (See Comments) Drops HR too low Drops HR too low Drops HR too low   Canagliflozin     Patient had an adverse reaction Other reaction(s): Other (See Comments) Patient had an adverse reaction Patient had an adverse reaction   Clonidine And Derivatives Nausea And Vomiting   Hydralazine  Hcl     Patient had an adverse reaction  Metformin And Related Diarrhea   Topiramate     Patient had an adverse reaction Other reaction(s): Other (See Comments) Patient had an adverse reaction Patient had an adverse reaction   Ambien  [Zolpidem ] Other (See Comments)    Amnesia    Farxiga  [Dapagliflozin ]     Recurrent UTIs.    Ozempic  (0.25 Or 0.5 Mg-Dose) [Semaglutide (0.25 Or 0.5mg -Dos)] Other (See Comments)    Nausea, abdominal pain

## 2024-06-10 DIAGNOSIS — R350 Frequency of micturition: Secondary | ICD-10-CM | POA: Insufficient documentation

## 2024-06-10 DIAGNOSIS — N3001 Acute cystitis with hematuria: Secondary | ICD-10-CM | POA: Insufficient documentation

## 2024-06-10 DIAGNOSIS — H6123 Impacted cerumen, bilateral: Secondary | ICD-10-CM | POA: Insufficient documentation

## 2024-06-10 NOTE — Assessment & Plan Note (Signed)
 Impacted cerumen, bilateral Bilateral impacted cerumen causing hearing difficulties. - Referred to [facility] for ear cleaning.

## 2024-06-10 NOTE — Assessment & Plan Note (Addendum)
 Acute cystitis with hematuria Urinary tract infection with frequency, urgency, and hematuria. Urinalysis shows 3+ leukocytes, blood, protein, and positive nitrates. Previous tolerance to Ciprofloxacin . - Prescribed Ciprofloxacin  250 mg twice daily for 5 days. - Sent urine for culture to determine appropriate antibiotic. - Provided refill for Ciprofloxacin  if symptoms persist after initial course. Orders:   ciprofloxacin  (CIPRO ) 250 MG tablet; Take 1 tablet (250 mg total) by mouth 2 (two) times daily for 5 days.

## 2024-06-10 NOTE — Assessment & Plan Note (Signed)
 UTI Patient here with symptoms of urinary frequency started yesterday. UA in the office showed Lab Results  Component Value Date   COLORU straw (A) 06/09/2024   CLARITYU cloudy (A) 06/09/2024   GLUCOSEUR negative 06/09/2024   BILIRUBINUR small (A) 06/09/2024   KETONESU negative 07/19/2022   SPECGRAV 1.020 06/09/2024   RBCUR small (A) 06/09/2024   PHUR 6.0 06/09/2024   PROTEINUR NEGATIVE 12/22/2023   UROBILINOGEN 0.2 06/09/2024   LEUKOCYTESUR Large (3+) (A) 06/09/2024    No suprapubic tenderness noted on exam.  He does not appear septic, has no flank tenderness.  Vitals reassuring.   Plan: Symptoms suggestive of urinary tract infection. I have prescribed Cipro  250 mg by mouth TWICE A DAY for 5 days. Your symptoms should gradually improve. Call if the burning worsens, you develop a fever, back pain or pelvic pain or if your symptom do not resolve after completing the antibiotic. Drink plenty of fluids Complete the full course of antibiotics even if the symptoms resolve. If possible, empty your bladder every 4 hours. Advised to watch out for any fever/chills/back pain and to go to the emergency room if she does for evaluation for pyelonephritis and IV fluids and IV antibiotics.  Orders:   POCT URINALYSIS DIP (CLINITEK)   Urine Culture

## 2024-06-13 ENCOUNTER — Encounter

## 2024-06-13 ENCOUNTER — Ambulatory Visit: Payer: Self-pay | Admitting: Family Medicine

## 2024-06-13 LAB — URINE CULTURE

## 2024-06-16 ENCOUNTER — Ambulatory Visit

## 2024-06-16 ENCOUNTER — Other Ambulatory Visit: Payer: Self-pay | Admitting: Family Medicine

## 2024-06-16 DIAGNOSIS — N3001 Acute cystitis with hematuria: Secondary | ICD-10-CM

## 2024-06-19 ENCOUNTER — Ambulatory Visit

## 2024-06-19 DIAGNOSIS — I4819 Other persistent atrial fibrillation: Secondary | ICD-10-CM | POA: Diagnosis not present

## 2024-06-22 LAB — CUP PACEART REMOTE DEVICE CHECK
Date Time Interrogation Session: 20260102095251
Implantable Lead Connection Status: 753985
Implantable Lead Connection Status: 753985
Implantable Lead Implant Date: 20180813
Implantable Lead Implant Date: 20180813
Implantable Lead Location: 753859
Implantable Lead Location: 753860
Implantable Lead Model: 377
Implantable Lead Model: 377
Implantable Lead Serial Number: 49893169
Implantable Lead Serial Number: 50011411
Implantable Pulse Generator Implant Date: 20180813
Pulse Gen Model: 407145
Pulse Gen Serial Number: 69158272

## 2024-06-23 ENCOUNTER — Ambulatory Visit: Payer: Self-pay | Admitting: Cardiology

## 2024-06-24 NOTE — Progress Notes (Signed)
 Remote PPM Transmission

## 2024-07-04 ENCOUNTER — Other Ambulatory Visit: Payer: Self-pay | Admitting: *Deleted

## 2024-07-04 ENCOUNTER — Encounter: Payer: Self-pay | Admitting: *Deleted

## 2024-07-04 DIAGNOSIS — E119 Type 2 diabetes mellitus without complications: Secondary | ICD-10-CM | POA: Insufficient documentation

## 2024-07-04 NOTE — Progress Notes (Unsigned)
" °  Electrophysiology Office Note:   Date:  07/04/2024  ID:  Paul Kent, DOB Dec 03, 1940, MRN [id number]  Primary Cardiologist: Lamar Fitch, MD Primary Heart Failure: None Electrophysiologist: Elenie Coven Gladis Norton, MD  {Click to update primary MD,subspecialty MD or APP then REFRESH:1}    History of Present Illness:   Paul Kent is a 84 y.o. male with h/o atrial fibrillation, sick sinus syndrome, chronic systolic heart failure, sleep apnea, diabetes, CKD stage IIIb seen today for routine electrophysiology followup.   Since last being seen in our clinic the patient reports doing ***.  he denies chest pain, palpitations, dyspnea, PND, orthopnea, nausea, vomiting, dizziness, syncope, edema, weight gain, or early satiety.   Review of systems complete and found to be negative unless listed in HPI.      EP Information / Studies Reviewed:    {EKGtoday:28818}      PPM Interrogation-  reviewed in detail today,  See PACEART report.  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   Wt Readings from Last 3 Encounters:  06/09/24 234 lb 6.4 oz (106.3 kg)  04/08/24 243 lb (110.2 kg)  02/22/24 247 lb (112 kg)     GEN: Well nourished, well developed in no acute distress NECK: No JVD; No carotid bruits CARDIAC: {EPRHYTHM:28826}, no murmurs, rubs, gallops RESPIRATORY:  Clear to auscultation without rales, wheezing or rhonchi  ABDOMEN: Soft, non-tender, non-distended EXTREMITIES:  No edema; No deformity   ASSESSMENT AND PLAN:    SND s/p Biotronik PPM  Normal PPM function See Pace Art report No changes today  2.  Persistent atrial fibrillation: Cryoablation 2019, RF ablation 2021.  On amiodarone .***  3.  Secondary hypercoagulable date: On Xarelto   4.  Hypertension:***  5.  Obstructive sleep apnea: CPAP compliance encouraged  Disposition:   Follow up  with {EPPROVIDERS:28135::EP Team} {EPFOLLOW LE:71826}  Signed, Gabriel Paulding Gladis Norton, MD  "

## 2024-07-07 ENCOUNTER — Ambulatory Visit: Admitting: Cardiology

## 2024-07-07 ENCOUNTER — Encounter: Payer: Self-pay | Admitting: Cardiology

## 2024-07-07 VITALS — BP 104/60 | HR 69 | Ht 75.0 in | Wt 230.0 lb

## 2024-07-07 VITALS — BP 110/58 | HR 60 | Ht 75.0 in | Wt 230.0 lb

## 2024-07-07 DIAGNOSIS — I4819 Other persistent atrial fibrillation: Secondary | ICD-10-CM | POA: Diagnosis not present

## 2024-07-07 DIAGNOSIS — R0609 Other forms of dyspnea: Secondary | ICD-10-CM | POA: Insufficient documentation

## 2024-07-07 DIAGNOSIS — Z950 Presence of cardiac pacemaker: Secondary | ICD-10-CM | POA: Insufficient documentation

## 2024-07-07 DIAGNOSIS — M79604 Pain in right leg: Secondary | ICD-10-CM | POA: Insufficient documentation

## 2024-07-07 DIAGNOSIS — I5032 Chronic diastolic (congestive) heart failure: Secondary | ICD-10-CM | POA: Diagnosis not present

## 2024-07-07 DIAGNOSIS — I130 Hypertensive heart and chronic kidney disease with heart failure and stage 1 through stage 4 chronic kidney disease, or unspecified chronic kidney disease: Secondary | ICD-10-CM | POA: Insufficient documentation

## 2024-07-07 DIAGNOSIS — D6869 Other thrombophilia: Secondary | ICD-10-CM | POA: Insufficient documentation

## 2024-07-07 DIAGNOSIS — E1142 Type 2 diabetes mellitus with diabetic polyneuropathy: Secondary | ICD-10-CM | POA: Insufficient documentation

## 2024-07-07 DIAGNOSIS — G4733 Obstructive sleep apnea (adult) (pediatric): Secondary | ICD-10-CM | POA: Insufficient documentation

## 2024-07-07 DIAGNOSIS — I495 Sick sinus syndrome: Secondary | ICD-10-CM | POA: Diagnosis present

## 2024-07-07 DIAGNOSIS — M79605 Pain in left leg: Secondary | ICD-10-CM | POA: Insufficient documentation

## 2024-07-07 DIAGNOSIS — R231 Pallor: Secondary | ICD-10-CM | POA: Diagnosis present

## 2024-07-07 MED ORDER — AMIODARONE HCL 200 MG PO TABS: 200.0000 mg | ORAL_TABLET | Freq: Two times a day (BID) | ORAL | 3 refills | Status: AC

## 2024-07-07 NOTE — Patient Instructions (Addendum)
 Medication Instructions:  Your physician has recommended you make the following change in your medication:  1) INCREASE amiodarone  to 200 mg twice daily for one month, then decrease to 1.5 tablets (300 mg) once daily *If you need a refill on your cardiac medications before your next appointment, please call your pharmacy*  Lab Work: TODAY: CBC, CMET, Magnesium, TSH, T4  Follow-Up: At [facility], you and your health needs are our priority.  As part of our continuing mission to provide you with exceptional heart care, our providers are all part of one team.  This team includes your primary Cardiologist (physician) and Advanced Practice Providers or APPs (Physician Assistants and Nurse Practitioners) who all work together to provide you with the care you need, when you need it.  Your next appointment:   3 month(s)  Provider:   Lamar Fitch, MD

## 2024-07-07 NOTE — Progress Notes (Signed)
 " Cardiology Office Note:    Date:  07/07/2024   ID:  Paul Kent 1941/05/08, MRN [id number]  PCP:  Sherre Clapper, MD  Cardiologist:  Lamar Fitch, MD    Referring MD: Sherre Clapper, MD   Chief Complaint  Patient presents with   Follow-up  Doing well  History of Present Illness:     Paul Kent is a 84 y.o. male past medical history significant for paroxysmal atrial fibrillation, status post atrial fibrillation ablation 22,019 2021 now on amiodarone  maintained sinus rhythm, anticoagulated Xarelto  dose is appropriate due to kidney function.  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  Comes today to months for follow-up he seems to be quite happy and optimistic today denies have any chest pain tightness squeezing pressure mid chest.  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) 04/26/2021   Corns and callosities    Deviated septum 07/01/2018   Deviated septum 07/01/2018   Diabetes mellitus due to underlying condition with unspecified complications (HCC) 06/08/2016   Diabetic ulcer of right heel associated with diabetes mellitus due to underlying condition, limited to breakdown of skin (HCC) 07/31/2021   Disturbances of salivary secretion    Epistaxis    Essential hypertension    Essential tremor    H pylori ulcer    H pylori ulcer    Herpes zoster with nervous system complication 06/03/2013    Hesitancy of micturition    Hyperlipidemia    Hypothyroidism    Impacted cerumen, bilateral    Jaw pain    Lesion of femoral nerve 04/30/2013   Localized edema    Male erectile disorder    Malignant 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)    Nasal congestion 07/01/2018   Nasal turbinate hypertrophy 07/01/2018   Obstructive sleep apnea 01/17/2017   Other constipation    Other fatigue    Overweight    Pacemaker reprogramming/check 02/12/2017   Pain in right finger(s)    Pain in right lower leg    Paroxysmal atrial fibrillation (HCC) 06/08/2016   Peptic ulcer disease    Persistent atrial fibrillation (HCC) 06/01/2017   Primary insomnia    Renal stones    Renal stones    Secondary hypercoagulable state 06/17/2020   Shingles 2014   WITH COMPLICATIONS (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)   Shingles 06/2012   Shortness of breath    Sick sinus syndrome (HCC)    Sinus bradycardia 03/07/2017   Sleep apnea    dx 08/2015, CPAP   Spontaneous ecchymoses    Status post ablation of atrial fibrillation 09/16/2020   Testicular hypofunction    Thoracic or lumbosacral neuritis or radiculitis 06/03/2013   Trigger middle finger of right hand 11/28/2019   Trigger middle finger of right hand 11/28/2019   Type 2 diabetes mellitus with circulatory disorder, without long-term current use of insulin  (  HCC) 01/17/2017    Past Surgical History:  Procedure Laterality Date   ABLATION  06/2017   ATRIAL FIBRILLATION ABLATION N/A 05/21/2020   Procedure: ATRIAL FIBRILLATION ABLATION;  Surgeon: Inocencio Soyla Lunger, MD;  Location: [facility] INVASIVE CV LAB;  Service: Cardiovascular;  Laterality: N/A;   CARDIOVERSION N/A 03/22/2018   Procedure: CARDIOVERSION;  Surgeon: Lonni Slain, MD;  Location: [facility] ENDOSCOPY;  Service: Cardiovascular;  Laterality: N/A;   CARDIOVERSION N/A 07/08/2020   Procedure: CARDIOVERSION;  Surgeon: Jeffrie Oneil BROCKS, MD;  Location: [facility] ENDOSCOPY;   Service: Cardiovascular;  Laterality: N/A;   HEMIARTHROPLASTY HIP  10/31/2013   IT HIP BIPOLAR   LUMBAR SPINE SURGERY     PACEMAKER PLACEMENT  01/29/2017   SHOULDER SURGERY Right     Current Medications: Active Medications[1]   Allergies:   Hydralazine , Propranolol, Canagliflozin, Clonidine and derivatives, Hydralazine  hcl, Metformin and related, Topiramate, Ambien  [zolpidem ], Farxiga  [dapagliflozin ], and Ozempic  (0.25 or 0.5 mg-dose) [semaglutide (0.25 or 0.5mg -dos)]   Social History   Socioeconomic History   Marital status: Married    Spouse name: Ginnie   Number of children: 3   Years of education: Not on file   Highest education level: Not on file  Occupational History   Occupation: RETIRED    Comment: school principal  Tobacco Use   Smoking status: Never   Smokeless tobacco: Never  Vaping Use   Vaping status: Never Used  Substance and Sexual Activity   Alcohol use: Not Currently   Drug use: Never   Sexual activity: Not Currently  Other Topics Concern   Not on file  Social History Narrative   Lives with wife   Social Drivers of Health   Tobacco Use: Low Risk (07/07/2024)   Patient History    Smoking Tobacco Use: Never    Smokeless Tobacco Use: Never    Passive Exposure: Not on file  Financial Resource Strain: Low Risk (05/30/2023)   Overall Financial Resource Strain (CARDIA)    Difficulty of Paying Living Expenses: Not hard at all  Food Insecurity: No Food Insecurity (12/24/2023)   Epic    Worried About Radiation Protection Practitioner of Food in the Last Year: Never true    Ran Out of Food in the Last Year: Never true  Transportation Needs: No Transportation Needs (12/24/2023)   Epic    Lack of Transportation (Medical): No    Lack of Transportation (Non-Medical): No  Physical Activity: Sufficiently Active (05/30/2023)   Exercise Vital Sign    Days of Exercise per Week: 4 days    Minutes of Exercise per Session: 60 min  Stress: No Stress Concern Present (05/30/2023)   [employer] - Occupational Stress Questionnaire    Feeling of Stress : Not at all  Social Connections: Moderately Isolated (12/24/2023)   Social Connection and Isolation Panel    Frequency of Communication with Friends and Family: Three times a week    Frequency of Social Gatherings with Friends and Family: Once a week    Attends Religious Services: Never    Database Administrator or Organizations: No    Attends Banker Meetings: Never    Marital Status: Married  Depression (PHQ2-9): Low Risk (06/09/2024)   Depression (PHQ2-9)    PHQ-2 Score: 0  Alcohol Screen: Low Risk (05/30/2023)   Alcohol Screen    Last Alcohol Screening Score (AUDIT): 0  Housing: Low Risk (02/22/2024)   Epic    Unable to Pay for Housing in the Last Year: No  Number of Times Moved in the Last Year: 0    Homeless in the Last Year: No  Utilities: Not At Risk (12/24/2023)   Epic    Threatened with loss of utilities: No  Health Literacy: Adequate Health Literacy (05/30/2023)   B1300 Health Literacy    Frequency of need for help with medical instructions: Never     Family History: The patient's family history includes Arthritis in an other family member; Congestive Heart Failure in an other family member; Diabetes type II in an other family member; Heart Problems in his father; Heart attack in his mother; Hyperlipidemia in an other family member; Transient ischemic attack in his mother; Tuberculosis in his brother. ROS:   Please see the history of present illness.    All 14 point review of systems negative except as described per history of present illness  EKGs/Labs/Other Studies Reviewed:         Recent Labs: 12/22/2023: TSH 2.304 12/27/2023: ALT 11; BUN 26; Creatinine, Ser 1.62; Hemoglobin 10.9; Magnesium 2.0; Platelets 149; Potassium 3.9; Sodium 139  Recent Lipid Panel    Component Value Date/Time   CHOL 117 12/18/2023 0935   TRIG 71 12/18/2023 0935   HDL 50 12/18/2023  0935   CHOLHDL 2.3 12/18/2023 0935   LDLCALC 52 12/18/2023 0935    Physical Exam:    VS:  BP 104/60   Pulse 69   Ht 6' 3 (1.905 m)   Wt 230 lb (104.3 kg)   SpO2 98%   BMI 28.75 kg/m     Wt Readings from Last 3 Encounters:  07/07/24 230 lb (104.3 kg)  07/07/24 230 lb (104.3 kg)  06/09/24 234 lb 6.4 oz (106.3 kg)     GEN:  Well nourished, well developed in no acute distress HEENT: Normal NECK: No JVD; No carotid bruits LYMPHATICS: No lymphadenopathy CARDIAC: RRR, no murmurs, no rubs, no gallops RESPIRATORY:  Clear to auscultation without rales, wheezing or rhonchi  ABDOMEN: Soft, non-tender, non-distended MUSCULOSKELETAL:  No edema; No deformity  SKIN: Warm and dry LOWER EXTREMITIES: no swelling NEUROLOGIC:  Alert and oriented x 3 PSYCHIATRIC:  Normal affect   ASSESSMENT:    1. Persistent atrial fibrillation (HCC)   2. Hypercoagulable state due to persistent atrial fibrillation (HCC)   3. Hypertensive heart and renal disease, stage 1-4 or unspecified chronic kidney disease, with heart failure (HCC)   4. Chronic heart failure with preserved ejection fraction (HFpEF, >= 50%) (HCC)   5. OSA on CPAP   6. Type 2 diabetes mellitus with peripheral neuropathy (HCC)   7. Cardiac pacemaker in situ    PLAN:    In order of problems listed above:  Post paroxysmal atrial fibrillation today maintained sinus rhythm investigation also revealed that he had very rare episode of atrial fibrillation continue with amiodarone . Obstructive sleep apnea on CPAP followed by antimedicine team. History of cardiomyopathy with normalization will repeat echocardiogram in March. 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 Essential hypertension blood pressure well-controlled. Dyslipidemia I did review K[facility] LDL 52 HDL 45 continue present management   Medication  Adjustments/Labs and Tests Ordered: Current medicines are reviewed at length with the patient today.  Concerns regarding medicines are outlined above.  No orders of the defined types were placed in this encounter.  Medication changes: No orders of the defined types were placed in this encounter.   Signed, Lamar  DOROTHA Fitch, MD, [facility] 07/07/2024 4:32 PM    [facility]    [1]  Current Meds  Medication Sig   acetaminophen  (TYLENOL ) 325 MG tablet Take 2 tablets (650 mg total) by mouth every 6 (six) hours as needed for mild pain (pain score 1-3) or fever (or Fever >/= 101).   amiodarone  (PACERONE ) 200 MG tablet Take 1 tablet (200 mg total) by mouth 2 (two) times daily. Take 1-2 tablets as directed   atorvastatin  (LIPITOR) 10 MG tablet TAKE ONE TABLET BY MOUTH EVERY EVENING   butalbital -acetaminophen -caffeine  (FIORICET ) 50-325-40 MG tablet Take 1 tablet by mouth every 6 (six) hours as needed for headache.   cyanocobalamin  1000 MCG tablet Take 1 tablet (1,000 mcg total) by mouth daily.   famotidine  (PEPCID ) 40 MG tablet TAKE ONE TABLET BY MOUTH DAILY   gabapentin  (NEURONTIN ) 300 MG capsule Take 1 capsule (300 mg total) by mouth 3 (three) times daily.   glipiZIDE  (GLUCOTROL  XL) 10 MG 24 hr tablet TAKE ONE TABLET BY MOUTH TWICE DAILY   Insulin  Pen Needle (BD PEN NEEDLE MINI ULTRAFINE) 31G X 5 MM MISC 1 each by Other route daily.   levothyroxine  (SYNTHROID ) 137 MCG tablet TAKE ONE TABLET BY MOUTH EVERY MORNING   ondansetron  (ZOFRAN ) 4 MG tablet Take 1 tablet (4 mg total) by mouth every 6 (six) hours as needed for nausea.   senna-docusate (SENOKOT-S) 8.6-50 MG tablet Take 1 tablet by mouth at bedtime as needed for mild constipation.   tamsulosin  (FLOMAX ) 0.4 MG CAPS capsule TAKE ONE CAPSULE BY MOUTH EVERY EVENING   tirzepatide  (MOUNJARO ) 7.5 MG/0.5ML Pen Inject 7.5 mg into the skin once a week.   torsemide  (DEMADEX ) 20 MG tablet Take 1 tablet (20 mg total) by mouth daily as  needed.   XARELTO  15 MG TABS tablet TAKE ONE TABLET BY MOUTH EVERY EVENING   "

## 2024-07-07 NOTE — Patient Instructions (Addendum)
 Medication Instructions:  Your physician recommends that you continue on your current medications as directed. Please refer to the Current Medication list given to you today.  *If you need a refill on your cardiac medications before your next appointment, please call your pharmacy*   Lab Work: None Ordered If you have labs (blood work) drawn today and your tests are completely normal, you will receive your results only by: MyChart Message (if you have MyChart) OR A paper copy in the mail If you have any lab test that is abnormal or we need to change your treatment, we will call you to review the results.   Testing/Procedures:  Your physician has requested that you have an ankle brachial index (ABI). During this test an ultrasound and blood pressure cuff are used to evaluate the arteries that supply the arms and legs with blood. Allow thirty minutes for this exam. There are no restrictions or special instructions.    March Your physician has requested that you have an echocardiogram. Echocardiography is a painless test that uses sound waves to create images of your heart. It provides your doctor with information about the size and shape of your heart and how well your hearts chambers and valves are working. This procedure takes approximately one hour. There are no restrictions for this procedure. Please do NOT wear cologne, perfume, aftershave, or lotions (deodorant is allowed). Please arrive 15 minutes prior to your appointment time.  Please note: We ask at that you not bring children with you during ultrasound (echo/ vascular) testing. Due to room size and safety concerns, children are not allowed in the ultrasound rooms during exams. Our front office staff cannot provide observation of children in our lobby area while testing is being conducted. An adult accompanying a patient to their appointment will only be allowed in the ultrasound room at the discretion of the ultrasound technician  under special circumstances. We apologize for any inconvenience.    Follow-Up: At [facility], you and your health needs are our priority.  As part of our continuing mission to provide you with exceptional heart care, we have created designated Provider Care Teams.  These Care Teams include your primary Cardiologist (physician) and Advanced Practice Providers (APPs -  Physician Assistants and Nurse Practitioners) who all work together to provide you with the care you need, when you need it.  We recommend signing up for the patient portal called MyChart.  Sign up information is provided on this After Visit Summary.  MyChart is used to connect with patients for Virtual Visits (Telemedicine).  Patients are able to view lab/test results, encounter notes, upcoming appointments, etc.  Non-urgent messages can be sent to your provider as well.   To learn more about what you can do with MyChart, go to [email or web address].    Your next appointment:   6 month(s)  The format for your next appointment:   In Person  Provider:   Lamar Fitch, MD    Other Instructions NA

## 2024-07-08 ENCOUNTER — Ambulatory Visit: Payer: Self-pay | Admitting: Cardiology

## 2024-07-08 DIAGNOSIS — I4819 Other persistent atrial fibrillation: Secondary | ICD-10-CM

## 2024-07-08 DIAGNOSIS — I130 Hypertensive heart and chronic kidney disease with heart failure and stage 1 through stage 4 chronic kidney disease, or unspecified chronic kidney disease: Secondary | ICD-10-CM

## 2024-07-08 DIAGNOSIS — I5032 Chronic diastolic (congestive) heart failure: Secondary | ICD-10-CM

## 2024-07-08 LAB — CUP PACEART INCLINIC DEVICE CHECK
Date Time Interrogation Session: 20260119105936
Implantable Lead Connection Status: 753985
Implantable Lead Connection Status: 753985
Implantable Lead Implant Date: 20180813
Implantable Lead Implant Date: 20180813
Implantable Lead Location: 753859
Implantable Lead Location: 753860
Implantable Lead Model: 377
Implantable Lead Model: 377
Implantable Lead Serial Number: 49893169
Implantable Lead Serial Number: 50011411
Implantable Pulse Generator Implant Date: 20180813
Pulse Gen Model: 407145
Pulse Gen Serial Number: 69158272

## 2024-07-08 LAB — COMPREHENSIVE METABOLIC PANEL WITH GFR
ALT: 12 IU/L (ref 0–44)
AST: 19 IU/L (ref 0–40)
Albumin: 3.7 g/dL (ref 3.7–4.7)
Alkaline Phosphatase: 137 IU/L — ABNORMAL HIGH (ref 48–129)
BUN/Creatinine Ratio: 15 (ref 10–24)
BUN: 26 mg/dL (ref 8–27)
Bilirubin Total: 0.6 mg/dL (ref 0.0–1.2)
CO2: 22 mmol/L (ref 20–29)
Calcium: 9 mg/dL (ref 8.6–10.2)
Chloride: 105 mmol/L (ref 96–106)
Creatinine, Ser: 1.73 mg/dL — ABNORMAL HIGH (ref 0.76–1.27)
Globulin, Total: 3.3 g/dL (ref 1.5–4.5)
Glucose: 117 mg/dL — ABNORMAL HIGH (ref 70–99)
Potassium: 5.3 mmol/L — ABNORMAL HIGH (ref 3.5–5.2)
Sodium: 142 mmol/L (ref 134–144)
Total Protein: 7 g/dL (ref 6.0–8.5)
eGFR: 38 mL/min/1.73 — ABNORMAL LOW

## 2024-07-08 LAB — CBC WITH DIFFERENTIAL/PLATELET
Basophils Absolute: 0.1 x10E3/uL (ref 0.0–0.2)
Basos: 2 %
EOS (ABSOLUTE): 0.2 x10E3/uL (ref 0.0–0.4)
Eos: 4 %
Hematocrit: 38.1 % (ref 37.5–51.0)
Hemoglobin: 11.9 g/dL — ABNORMAL LOW (ref 13.0–17.7)
Immature Grans (Abs): 0 x10E3/uL (ref 0.0–0.1)
Immature Granulocytes: 0 %
Lymphocytes Absolute: 0.9 x10E3/uL (ref 0.7–3.1)
Lymphs: 19 %
MCH: 31.6 pg (ref 26.6–33.0)
MCHC: 31.2 g/dL — ABNORMAL LOW (ref 31.5–35.7)
MCV: 101 fL — ABNORMAL HIGH (ref 79–97)
Monocytes Absolute: 0.6 x10E3/uL (ref 0.1–0.9)
Monocytes: 12 %
Neutrophils Absolute: 3 x10E3/uL (ref 1.4–7.0)
Neutrophils: 63 %
Platelets: 164 x10E3/uL (ref 150–450)
RBC: 3.76 x10E6/uL — ABNORMAL LOW (ref 4.14–5.80)
RDW: 13.3 % (ref 11.6–15.4)
WBC: 4.7 x10E3/uL (ref 3.4–10.8)

## 2024-07-08 LAB — TSH+FREE T4
Free T4: 1.52 ng/dL (ref 0.82–1.77)
TSH: 12 u[IU]/mL — ABNORMAL HIGH (ref 0.450–4.500)

## 2024-07-08 LAB — MAGNESIUM: Magnesium: 2.2 mg/dL (ref 1.6–2.3)

## 2024-07-17 LAB — BASIC METABOLIC PANEL WITH GFR
BUN/Creatinine Ratio: 11 (ref 10–24)
BUN: 20 mg/dL (ref 8–27)
CO2: 18 mmol/L — ABNORMAL LOW (ref 20–29)
Calcium: 8.4 mg/dL — ABNORMAL LOW (ref 8.6–10.2)
Chloride: 103 mmol/L (ref 96–106)
Creatinine, Ser: 1.74 mg/dL — ABNORMAL HIGH (ref 0.76–1.27)
Glucose: 144 mg/dL — ABNORMAL HIGH (ref 70–99)
Potassium: 4.7 mmol/L (ref 3.5–5.2)
Sodium: 138 mmol/L (ref 134–144)
eGFR: 38 mL/min/{1.73_m2} — ABNORMAL LOW

## 2024-07-22 ENCOUNTER — Ambulatory Visit: Admitting: Family Medicine

## 2024-07-29 ENCOUNTER — Ambulatory Visit: Admitting: Family Medicine

## 2024-08-12 ENCOUNTER — Ambulatory Visit

## 2024-09-12 ENCOUNTER — Encounter

## 2024-09-18 ENCOUNTER — Encounter

## 2024-09-29 ENCOUNTER — Ambulatory Visit: Admitting: Cardiology

## 2024-12-12 ENCOUNTER — Encounter

## 2024-12-18 ENCOUNTER — Encounter

## 2025-03-13 ENCOUNTER — Encounter
